# Patient Record
Sex: Female | Born: 1955
Health system: Southern US, Community
[De-identification: ages and names within clinical notes are randomized; demographics above are authoritative.]

## PROBLEM LIST (undated history)

## (undated) DIAGNOSIS — I671 Cerebral aneurysm, nonruptured: Secondary | ICD-10-CM

## (undated) DIAGNOSIS — K635 Polyp of colon: Secondary | ICD-10-CM

## (undated) DIAGNOSIS — R251 Tremor, unspecified: Secondary | ICD-10-CM

## (undated) DIAGNOSIS — M5417 Radiculopathy, lumbosacral region: Secondary | ICD-10-CM

## (undated) DIAGNOSIS — E079 Disorder of thyroid, unspecified: Secondary | ICD-10-CM

## (undated) DIAGNOSIS — H269 Unspecified cataract: Secondary | ICD-10-CM

## (undated) DIAGNOSIS — M810 Age-related osteoporosis without current pathological fracture: Secondary | ICD-10-CM

## (undated) DIAGNOSIS — G4700 Insomnia, unspecified: Secondary | ICD-10-CM

## (undated) DIAGNOSIS — I10 Essential (primary) hypertension: Secondary | ICD-10-CM

## (undated) DIAGNOSIS — C4490 Unspecified malignant neoplasm of skin, unspecified: Secondary | ICD-10-CM

## (undated) DIAGNOSIS — E7800 Pure hypercholesterolemia, unspecified: Secondary | ICD-10-CM

## (undated) DIAGNOSIS — R519 Headache, unspecified: Secondary | ICD-10-CM

## (undated) DIAGNOSIS — Z87442 Personal history of urinary calculi: Secondary | ICD-10-CM

## (undated) DIAGNOSIS — G43909 Migraine, unspecified, not intractable, without status migrainosus: Secondary | ICD-10-CM

## (undated) DIAGNOSIS — N200 Calculus of kidney: Secondary | ICD-10-CM

## (undated) DIAGNOSIS — E119 Type 2 diabetes mellitus without complications: Secondary | ICD-10-CM

## (undated) HISTORY — DX: Unspecified cataract: H26.9

## (undated) HISTORY — DX: Calculus of kidney: N20.0

## (undated) HISTORY — DX: Disorder of thyroid, unspecified: E07.9

## (undated) HISTORY — DX: Pure hypercholesterolemia, unspecified: E78.00

## (undated) HISTORY — DX: Tremor, unspecified: R25.1

## (undated) HISTORY — DX: Radiculopathy, lumbosacral region: M54.17

## (undated) HISTORY — DX: Insomnia, unspecified: G47.00

## (undated) HISTORY — DX: Unspecified malignant neoplasm of skin, unspecified: C44.90

## (undated) HISTORY — DX: Age-related osteoporosis without current pathological fracture: M81.0

## (undated) HISTORY — PX: INNER EAR SURGERY: SHX679

## (undated) HISTORY — DX: Cerebral aneurysm, nonruptured: I67.1

## (undated) HISTORY — DX: Type 2 diabetes mellitus without complications: E11.9

## (undated) HISTORY — DX: Polyp of colon: K63.5

## (undated) HISTORY — DX: Essential (primary) hypertension: I10

## (undated) HISTORY — PX: VAGINAL HYSTERECTOMY: SHX2639

## (undated) HISTORY — DX: Headache, unspecified: R51.9

## (undated) HISTORY — PX: BREAST BIOPSY: SHX20

## (undated) HISTORY — PX: APPENDECTOMY: SHX54

## (undated) HISTORY — DX: Migraine, unspecified, not intractable, without status migrainosus: G43.909

## (undated) HISTORY — PX: TONSILLECTOMY AND ADENOIDECTOMY: SUR1326

---

## 1997-10-23 ENCOUNTER — Inpatient Hospital Stay (HOSPITAL_COMMUNITY): Admission: EM | Admit: 1997-10-23 | Discharge: 1997-10-24 | Payer: Self-pay | Admitting: Emergency Medicine

## 2011-05-26 DIAGNOSIS — E119 Type 2 diabetes mellitus without complications: Secondary | ICD-10-CM

## 2011-05-26 HISTORY — DX: Type 2 diabetes mellitus without complications: E11.9

## 2011-05-26 HISTORY — PX: CHOLECYSTECTOMY: SHX55

## 2011-05-26 LAB — HM MAMMOGRAPHY

## 2011-09-22 DIAGNOSIS — N200 Calculus of kidney: Secondary | ICD-10-CM | POA: Insufficient documentation

## 2011-09-22 DIAGNOSIS — Z85828 Personal history of other malignant neoplasm of skin: Secondary | ICD-10-CM | POA: Insufficient documentation

## 2012-05-25 LAB — HM COLONOSCOPY

## 2012-05-25 LAB — HM MAMMOGRAPHY

## 2012-07-06 DIAGNOSIS — K219 Gastro-esophageal reflux disease without esophagitis: Secondary | ICD-10-CM | POA: Insufficient documentation

## 2012-08-19 DIAGNOSIS — K229 Disease of esophagus, unspecified: Secondary | ICD-10-CM | POA: Insufficient documentation

## 2013-02-01 DIAGNOSIS — Z8659 Personal history of other mental and behavioral disorders: Secondary | ICD-10-CM | POA: Insufficient documentation

## 2013-03-17 DIAGNOSIS — R519 Headache, unspecified: Secondary | ICD-10-CM | POA: Insufficient documentation

## 2013-03-17 DIAGNOSIS — Z8679 Personal history of other diseases of the circulatory system: Secondary | ICD-10-CM | POA: Insufficient documentation

## 2013-03-19 DIAGNOSIS — I671 Cerebral aneurysm, nonruptured: Secondary | ICD-10-CM

## 2013-03-19 HISTORY — DX: Cerebral aneurysm, nonruptured: I67.1

## 2013-03-19 HISTORY — PX: BRAIN SURGERY: SHX531

## 2013-06-23 ENCOUNTER — Encounter: Payer: Self-pay | Admitting: Family Medicine

## 2013-06-23 ENCOUNTER — Ambulatory Visit (HOSPITAL_BASED_OUTPATIENT_CLINIC_OR_DEPARTMENT_OTHER)
Admission: RE | Admit: 2013-06-23 | Discharge: 2013-06-23 | Disposition: A | Discharge status: Home / Self Care | Payer: Managed Care, Other (non HMO) | Source: Ambulatory Visit | Attending: Family Medicine | Admitting: Family Medicine

## 2013-06-23 ENCOUNTER — Ambulatory Visit (INDEPENDENT_AMBULATORY_CARE_PROVIDER_SITE_OTHER): Payer: Managed Care, Other (non HMO) | Admitting: Family Medicine

## 2013-06-23 VITALS — BP 130/70 | HR 94 | Temp 98.2°F | Ht 67.0 in | Wt 178.0 lb

## 2013-06-23 DIAGNOSIS — J40 Bronchitis, not specified as acute or chronic: Secondary | ICD-10-CM | POA: Insufficient documentation

## 2013-06-23 DIAGNOSIS — R0602 Shortness of breath: Secondary | ICD-10-CM

## 2013-06-23 DIAGNOSIS — J019 Acute sinusitis, unspecified: Secondary | ICD-10-CM

## 2013-06-23 DIAGNOSIS — J209 Acute bronchitis, unspecified: Secondary | ICD-10-CM

## 2013-06-23 DIAGNOSIS — I10 Essential (primary) hypertension: Secondary | ICD-10-CM | POA: Insufficient documentation

## 2013-06-23 DIAGNOSIS — E119 Type 2 diabetes mellitus without complications: Secondary | ICD-10-CM | POA: Insufficient documentation

## 2013-06-23 DIAGNOSIS — R059 Cough, unspecified: Secondary | ICD-10-CM | POA: Insufficient documentation

## 2013-06-23 DIAGNOSIS — R05 Cough: Secondary | ICD-10-CM | POA: Insufficient documentation

## 2013-06-23 MED ORDER — AZITHROMYCIN 250 MG PO TABS: ORAL_TABLET | ORAL | Status: DC

## 2013-06-23 NOTE — Patient Instructions (Signed)

## 2013-06-23 NOTE — Progress Notes (Signed)
  Subjective:     Laura Gentry is a 58 y.o. female who presents for evaluation of sinus pain. Symptoms include: congestion, cough, facial pain, nasal congestion, post nasal drip and sinus pressure. Onset of symptoms was 2 weeks ago. Symptoms have been gradually worsening since that time. Past history is significant for no history of pneumonia or bronchitis. Patient is a former smoker, quit 2 years ago.    The following portions of the patient's history were reviewed and updated as appropriate: allergies, current medications, past family history, past medical history, past social history, past surgical history and problem list.  Review of Systems Pertinent items are noted in HPI.   Objective:    BP 130/70  Pulse 94  Temp(Src) 98.2 F (36.8 C) (Oral)  Ht 5\' 7"  (1.702 m)  Wt 178 lb (80.74 kg)  BMI 27.87 kg/m2  SpO2 98% General appearance: alert, cooperative, appears stated age and no distress Ears: normal TM's and external ear canals both ears Nose: green discharge, moderate congestion, turbinates red, swollen, sinus tenderness bilateral Throat: lips, mucosa, and tongue normal; teeth and gums normal Neck: no adenopathy, supple, symmetrical, trachea midline and thyroid not enlarged, symmetric, no tenderness/mass/nodules Lungs: clear to auscultation bilaterally Heart: S1, S2 normal    Assessment:    Acute bacterial sinusitis SOB / bronchitis.      Plan:    Nasal steroids per medication orders. Antihistamines per medication orders. Zithromax per medication orders. /fu prn  cxr

## 2013-06-23 NOTE — Progress Notes (Signed)
Pre visit review using our clinic review tool, if applicable. No additional management support is needed unless otherwise documented below in the visit note. 

## 2013-06-28 ENCOUNTER — Telehealth: Payer: Self-pay

## 2013-06-28 NOTE — Telephone Encounter (Signed)
Patient called to schedule an appt for SOB. Patient states that she has been having SOB on excertion since her visit at the end of January. Would like to be rechecked. Scheduler ask for advise. Advised that she can wait until next available. Will be seen at  815 Thurs 06/29/2012.

## 2013-06-29 ENCOUNTER — Encounter: Payer: Self-pay | Admitting: Nurse Practitioner

## 2013-06-29 ENCOUNTER — Ambulatory Visit (INDEPENDENT_AMBULATORY_CARE_PROVIDER_SITE_OTHER): Payer: Managed Care, Other (non HMO) | Admitting: Nurse Practitioner

## 2013-06-29 VITALS — BP 169/83 | HR 67 | Temp 97.7°F | Ht 67.0 in | Wt 176.4 lb

## 2013-06-29 DIAGNOSIS — R0602 Shortness of breath: Secondary | ICD-10-CM

## 2013-06-29 DIAGNOSIS — J189 Pneumonia, unspecified organism: Secondary | ICD-10-CM

## 2013-06-29 DIAGNOSIS — J984 Other disorders of lung: Secondary | ICD-10-CM

## 2013-06-29 MED ORDER — PREDNISONE 10 MG PO TABS: ORAL_TABLET | ORAL | Status: DC

## 2013-06-29 MED ORDER — ALBUTEROL SULFATE HFA 108 (90 BASE) MCG/ACT IN AERS: 2.0000 | INHALATION_SPRAY | Freq: Four times a day (QID) | RESPIRATORY_TRACT | Status: DC | PRN

## 2013-06-29 NOTE — Patient Instructions (Signed)
Please follow up in 3 weeks or sooner if symptoms do not improve. Nice to meet you!  Pneumonitis Pneumonitis is inflammation of the lungs.  CAUSES  Many things can cause pneumonitis. These can include:   A bacterial or viral infection. Pneumonitis due to an infection is usually called pneumonia.  Work-related exposures, including farm and industrial work. Some substances that can cause pneumonitis include asbestos, silica, inhaled acids, or inhaled chlorine gas.   Repeated exposure to bird feathers, bird feces, or other allergens.   Medicine such as chemotherapy drugs, certain antibiotics, and some heart medicines.   Radiation therapy.   Exposure to mold. A hot tub, sauna, or home humidifier can have mold growing in it, even if it looks clean. The mold can be breathed in through water vapor.  Breathing (aspirating) stomach contents, food, or liquids into the lungs.  SIGNS AND SYMPTOMS   Cough.   Shortness of breath or difficulty breathing.   Fever.   Decreased energy.   Decreased appetite.  DIAGNOSIS  To diagnose pneumonitis, your health care provider will do a complete history and physical exam. Various tests may be ordered, such as:   Pulmonary function test.   Chest X-ray.   CT scan of the lungs.   Bronchoscopy.   Lung biopsy.  TREATMENT  Treatment will depend on the cause of the pneumonitis. If the cause is exposure to a substance, avoiding further exposure to that substance will help reduce your symptoms. Possible medical treatments for pneumonitis include:   Corticosteroid medicine to help decrease inflammation in the lungs.   Antibiotic medicine to help fight a bacterial lung infection.   Oxygen therapy if you are having difficulty breathing.  HOME CARE INSTRUCTIONS   Avoid exposure to any substance identified as the cause of your pneumonitis.   If you must continue to work with substances that can cause pneumonitis, wear a mask to  protect your lungs.   Only take over-the-counter or prescription medicine as directed by your health care provider.   Do not smoke.   If you use inhalers, keep them with you at all times.   Follow up with your health care provider as directed.  SEEK IMMEDIATE MEDICAL CARE IF:   You develop new or increased shortness of breath.   You develop a blue color (cyanosis) under your fingernails.   You have a fever.  MAKE SURE YOU:   Understand these instructions.  Will watch your condition.  Will get help right away if you are not doing well or get worse. Document Released: 10/29/2009 Document Revised: 01/11/2013 Document Reviewed: 10/31/2012 [facility] Patient Information 2014 [place], [place].

## 2013-06-29 NOTE — Progress Notes (Signed)
Pre-visit discussion using our clinic review tool. No additional management support is needed unless otherwise documented below in the visit note.  

## 2013-06-30 DIAGNOSIS — R0602 Shortness of breath: Secondary | ICD-10-CM | POA: Insufficient documentation

## 2013-06-30 NOTE — Progress Notes (Signed)
Subjective:    Patient ID: Laura Gentry, female    DOB: 10/30/1955, 58 y.o.   MRN: [id number]  Shortness of Breath This is a chronic problem. The current episode started 1 to 4 weeks ago (3wks). The problem occurs daily. The problem has been gradually worsening (pt was seen in ofc 1 wk ago for cough, SOB, fatigue. Tx w/nasal steroids & azithromycin.Marland Kitchen Cough better, but has worsening SOB, persistent fatigue.). Associated symptoms include headaches (chronic HA since aneurysm clipping). Pertinent negatives include no abdominal pain, chest pain, ear pain, fever, hemoptysis, leg pain, leg swelling, neck pain, sore throat, sputum production, vomiting or wheezing. The symptoms are aggravated by URIs (activity-walking: states had to rest in grocery store due to SOB, unusual for her. States symptoms started w/"cold" 3 wks ago. ). The patient has no known risk factors for DVT/PE. She has tried nothing for the symptoms. The treatment provided mild (cough & upper resp symptoms improved, SOB getting worse.) relief. Her past medical history is significant for pneumonia (reports pneumonia 3-4 times, last episode 5 ya.) and a recent surgery (brain aneurysm w/clipping 3 mos ago).      Review of Systems  Constitutional: Positive for fatigue. Negative for fever, chills, activity change and appetite change.  HENT: Negative for congestion, ear pain, postnasal drip and sore throat.   Respiratory: Positive for shortness of breath. Negative for cough, hemoptysis, sputum production, chest tightness and wheezing.   Cardiovascular: Negative for chest pain, palpitations and leg swelling.  Gastrointestinal: Negative for vomiting, abdominal pain, diarrhea and blood in stool.  Musculoskeletal: Negative for back pain and neck pain.  Neurological: Positive for tremors (developed hand tremor since aneurysm clipping. Neuro started propranolol, pt started med yesterday-has helped tremor.) and headaches (chronic HA since aneurysm  clipping).       ST Memory loss since aneurysm clipping       Objective:   Physical Exam  Vitals reviewed. Constitutional: She is oriented to person, place, and time. She appears well-developed and well-nourished. No distress.  HENT:  Head: Normocephalic and atraumatic.  Right Ear: External ear normal.  Left Ear: External ear normal.  Mouth/Throat: Oropharynx is clear and moist. No oropharyngeal exudate.  Wearing wig.  Eyes: Conjunctivae are normal. Right eye exhibits no discharge. Left eye exhibits no discharge.  Neck: Normal range of motion. Neck supple. No thyromegaly present.  Cardiovascular: Normal rate, regular rhythm and normal heart sounds.   No murmur heard. Pulmonary/Chest: Effort normal. No respiratory distress. She has no wheezes. She has no rales.  Diffusely coarse BS, bilat. Speaking in complete sentences. Does not appear SOB while sitting & talking.  Lymphadenopathy:    She has no cervical adenopathy.  Neurological: She is alert and oriented to person, place, and time.  Skin: Skin is warm and dry.  No pallor or cyanosis  Psychiatric: She has a normal mood and affect. Her behavior is normal. Thought content normal.          Assessment & Plan:  1. SOBOE (shortness of breath on exertion) Recent Hx viral URI w/bronchitis. DD: pulm thrombus, anemia, effects of beta blocker, new onset CHF  2. Pneumonitis Likely post-viral reaction.  - predniSONE (DELTASONE) 10 MG tablet; Take 4T PO QAM X 3d, then 3T PO QAM X 3d, then 2T PO QAM X 3d, then 1T PO QAM X 3d, them d/c.  Dispense: 30 tablet; Refill: 0 - albuterol (PROVENTIL HFA;VENTOLIN HFA) 108 (90 BASE) MCG/ACT inhaler; Inhale 2 puffs into the lungs every  6 (six) hours as needed for shortness of breath.  Dispense: 1 Inhaler; Refill: 0  See pt instructions.

## 2013-07-10 ENCOUNTER — Telehealth: Payer: Self-pay | Admitting: *Deleted

## 2013-07-10 ENCOUNTER — Ambulatory Visit (INDEPENDENT_AMBULATORY_CARE_PROVIDER_SITE_OTHER): Payer: Managed Care, Other (non HMO) | Admitting: Physician Assistant

## 2013-07-10 ENCOUNTER — Encounter: Payer: Self-pay | Admitting: Physician Assistant

## 2013-07-10 VITALS — BP 128/79 | HR 61 | Temp 98.0°F | Resp 16 | Ht 67.0 in | Wt 177.1 lb

## 2013-07-10 DIAGNOSIS — R059 Cough, unspecified: Secondary | ICD-10-CM

## 2013-07-10 DIAGNOSIS — R0602 Shortness of breath: Secondary | ICD-10-CM

## 2013-07-10 DIAGNOSIS — R05 Cough: Secondary | ICD-10-CM

## 2013-07-10 DIAGNOSIS — J069 Acute upper respiratory infection, unspecified: Secondary | ICD-10-CM

## 2013-07-10 LAB — BASIC METABOLIC PANEL
BUN: 13 mg/dL (ref 6–23)
CO2: 27 mEq/L (ref 19–32)
Calcium: 8.7 mg/dL (ref 8.4–10.5)
Chloride: 101 mEq/L (ref 96–112)
Creatinine, Ser: 0.9 mg/dL (ref 0.4–1.2)
GFR: 71.03 mL/min (ref >60.00)
Glucose, Bld: 141 mg/dL — ABNORMAL HIGH (ref 70–99)
Potassium: 3.9 mEq/L (ref 3.5–5.1)
Sodium: 137 mEq/L (ref 135–145)

## 2013-07-10 LAB — CBC WITH DIFFERENTIAL/PLATELET
Basophils Absolute: 0 10*3/uL (ref 0.0–0.1)
Basophils Relative: 0.4 % (ref 0.0–3.0)
Eosinophils Absolute: 0.2 10*3/uL (ref 0.0–0.7)
Eosinophils Relative: 1.7 % (ref 0.0–5.0)
HCT: 41.7 % (ref 36.0–46.0)
Hemoglobin: 13.3 g/dL (ref 12.0–15.0)
Lymphocytes Relative: 29.2 % (ref 12.0–46.0)
Lymphs Abs: 3.2 10*3/uL (ref 0.7–4.0)
MCHC: 31.9 g/dL (ref 30.0–36.0)
MCV: 83.1 fl (ref 78.0–100.0)
Monocytes Absolute: 0.7 10*3/uL (ref 0.1–1.0)
Monocytes Relative: 6.6 % (ref 3.0–12.0)
Neutro Abs: 6.8 10*3/uL (ref 1.4–7.7)
Neutrophils Relative %: 62.1 % (ref 43.0–77.0)
Platelets: 381 10*3/uL (ref 150.0–400.0)
RBC: 5.02 Mil/uL (ref 3.87–5.11)
RDW: 13.9 % (ref 11.5–14.6)
WBC: 10.9 10*3/uL — ABNORMAL HIGH (ref 4.5–10.5)

## 2013-07-10 LAB — BRAIN NATRIURETIC PEPTIDE: Pro B Natriuretic peptide (BNP): 51 pg/mL (ref 0.0–100.0)

## 2013-07-10 LAB — D-DIMER, QUANTITATIVE: D-Dimer, Quant: 0.27 ug/mL-FEU (ref 0.00–0.48)

## 2013-07-10 MED ORDER — HYDROCOD POLST-CHLORPHEN POLST 10-8 MG/5ML PO LQCR: 5.0000 mL | Freq: Two times a day (BID) | ORAL | Status: DC | PRN

## 2013-07-10 NOTE — Patient Instructions (Signed)
Please obtain labs.  I will call you with your results.  Increase fluid intake.  Rest.  Saline nasal spray.  Use tussionex as directed for cough.  Finish prednisone taper.  Use albuterol as directed. Plain mucinex. If you develop worsening shortness of breath, chest pain, lightheadedness or dizziness, please proceed immediately to the ER.

## 2013-07-10 NOTE — Telephone Encounter (Signed)
[facility] lab called with D Dimer results of 0.27. Results forwarded to Elyn Aquas, [facility]

## 2013-07-10 NOTE — Progress Notes (Signed)
Pre visit review using our clinic review tool, if applicable. No additional management support is needed unless otherwise documented below in the visit note/SLS  

## 2013-07-10 NOTE — Progress Notes (Signed)
Patient presents to clinic today c/o 2 days of sinus pressure, postnasal drip and nonproductive cough. Patient denies fever, chills or aches. Patient does endorse some shortness of breath with exertion that has been present for over a month. Patient was recently seen on 06/29/2013 and diagnosed with shortness of breath on exertion and pneumonitis.  Patient was given prescription for prednisone taper and albuterol inhaler.  Patient endorses taking steroid taper as prescribed. Has a few days left. Has not used albuterol inhaler. Patient denies history of stroke, heart attack or CHF. Patient has history of asthma. Patient denies history of blood clot. Denies recent surgery, recent travel or prolonged immobilization. Patient denies leg swelling. Patient denies chest pain, palpitations, lightheadedness or dizziness. Denies history of syncope. Recent CXR was negative for acute cardiopulmonary process.  Past Medical History  Diagnosis Date  . Diabetes   . High blood pressure   . Kidney stones   . Migraines   . Colon polyps   . Thyroid disease   . Brain aneurysm 03/19/13    Current Outpatient Prescriptions on File Prior to Visit  Medication Sig Dispense Refill  . albuterol (PROVENTIL HFA;VENTOLIN HFA) 108 (90 BASE) MCG/ACT inhaler Inhale 2 puffs into the lungs every 6 (six) hours as needed for shortness of breath.  1 Inhaler  0  . amitriptyline (ELAVIL) 10 MG tablet Take 10 mg by mouth at bedtime.      . cyclobenzaprine (FLEXERIL) 10 MG tablet Take 10 mg by mouth 3 (three) times daily as needed for muscle spasms.      Marland Kitchen levETIRAcetam (KEPPRA) 500 MG tablet Take 500 mg by mouth 2 (two) times daily.      Marland Kitchen levothyroxine (SYNTHROID, LEVOTHROID) 50 MCG tablet Take 50 mcg by mouth daily before breakfast.      . omeprazole (PRILOSEC) 40 MG capsule Take 40 mg by mouth daily.      . pravastatin (PRAVACHOL) 10 MG tablet Take 10 mg by mouth daily.      . predniSONE (DELTASONE) 10 MG tablet Take 4T PO QAM X 3d,  then 3T PO QAM X 3d, then 2T PO QAM X 3d, then 1T PO QAM X 3d, them d/c.  30 tablet  0  . propranolol (INDERAL) 80 MG tablet Take 80 mg by mouth at bedtime.      . sertraline (ZOLOFT) 100 MG tablet Take 100 mg by mouth daily.      . traMADol (ULTRAM) 50 MG tablet Take 50 mg by mouth every 12 (twelve) hours as needed.       No current facility-administered medications on file prior to visit.    Allergies  Allergen Reactions  . Penicillins Anaphylaxis  . Sulfa Antibiotics Hives, Itching and Swelling    No family history on file.  History   Social History  . Marital Status: Single    Spouse Name: N/A    Number of Children: N/A  . Years of Education: N/A   Social History Main Topics  . Smoking status: Former Smoker    Quit date: 05/26/2011  . Smokeless tobacco: Never Used  . Alcohol Use: No  . Drug Use: No  . Sexual Activity: None   Other Topics Concern  . None   Social History Narrative  . None    Review of Systems - See HPI.  All other ROS are negative.  BP 128/79  Pulse 61  Temp(Src) 98 F (36.7 C) (Oral)  Resp 16  Ht 5\' 7"  (1.702 m)  Wt  177 lb 2 oz (80.343 kg)  BMI 27.74 kg/m2  SpO2 97%  Physical Exam  Vitals reviewed. Constitutional: She is oriented to person, place, and time and well-developed, well-nourished, and in no distress.  HENT:  Head: Normocephalic and atraumatic.  Right Ear: External ear normal.  Left Ear: External ear normal.  Nose: Nose normal.  Mouth/Throat: Oropharynx is clear and moist. No oropharyngeal exudate.  Tympanic membrane within normal limits bilaterally. No tenderness to percussion of sinuses noted on exam.  Eyes: Conjunctivae are normal. Pupils are equal, round, and reactive to light.  Neck: Neck supple.  Cardiovascular: Normal rate, regular rhythm, normal heart sounds and intact distal pulses.   Pulses:      Dorsalis pedis pulses are 2+ on the right side, and 2+ on the left side.       Posterior tibial pulses are 2+ on the  right side, and 2+ on the left side.  No LE edema or tenderness noted on examination.  Pulmonary/Chest: Effort normal and breath sounds normal. No respiratory distress. She has no wheezes. She has no rales. She exhibits no tenderness.  Lymphadenopathy:    She has no cervical adenopathy.  Neurological: She is alert and oriented to person, place, and time.  Skin: Skin is warm and dry. No rash noted.  Psychiatric: Affect normal.    Assessment/Plan: SOB (shortness of breath) on exertion Seems pulmonary in nature. May still be a post-viral reaction to recent URI.  However, feel it merits further workup. Will obtain CBC, BMP, D-Dimer.  Patient encouraged to finish steroid taper as prescribed.  Use albuterol inhaler PRN as directed.  Referral to Pulmonology placed for assessment ant PFT's. Patient educated on alarm symptoms and when to proceed to ER.  Patient voices understanding.  Viral URI Symptoms x 2 days.  Increase fluid intake.  Rest.  Saline nasal spray.  Albuterol inhaler and Prednisone taper.  Humidifer in bedroom.  Plain Mucinex.  Probiotic.

## 2013-07-11 DIAGNOSIS — J069 Acute upper respiratory infection, unspecified: Secondary | ICD-10-CM | POA: Insufficient documentation

## 2013-07-11 DIAGNOSIS — R0602 Shortness of breath: Secondary | ICD-10-CM | POA: Insufficient documentation

## 2013-07-11 NOTE — Assessment & Plan Note (Signed)
Symptoms x 2 days.  Increase fluid intake.  Rest.  Saline nasal spray.  Albuterol inhaler and Prednisone taper.  Humidifer in bedroom.  Plain Mucinex.  Probiotic.

## 2013-07-11 NOTE — Assessment & Plan Note (Addendum)
Seems pulmonary in nature. May still be a post-viral reaction to recent URI.  However, feel it merits further workup. Will obtain CBC, BMP, D-Dimer.  Patient encouraged to finish steroid taper as prescribed.  Use albuterol inhaler PRN as directed.  Referral to Pulmonology placed for assessment ant PFT's. Patient educated on alarm symptoms and when to proceed to ER.  Patient voices understanding.

## 2013-07-17 ENCOUNTER — Encounter: Payer: Self-pay | Admitting: Internal Medicine

## 2013-07-17 ENCOUNTER — Ambulatory Visit (INDEPENDENT_AMBULATORY_CARE_PROVIDER_SITE_OTHER): Payer: Self-pay | Admitting: Internal Medicine

## 2013-07-17 VITALS — BP 118/76 | HR 74 | Temp 98.1°F | Ht 67.0 in | Wt 178.8 lb

## 2013-07-17 DIAGNOSIS — R0602 Shortness of breath: Secondary | ICD-10-CM

## 2013-07-17 DIAGNOSIS — I10 Essential (primary) hypertension: Secondary | ICD-10-CM

## 2013-07-17 MED ORDER — NEBIVOLOL HCL 10 MG PO TABS: 10.0000 mg | ORAL_TABLET | Freq: Every day | ORAL | Status: DC

## 2013-07-17 NOTE — Patient Instructions (Signed)
Stop inderal/propranolol Start bystolic 10 mg daily in its place  Take tussionex up to 2 tsp every 6 hours over the next 3-5 days to get out in front of the cough then stop it  Prilosec 40 mg Take 30-60 min before first meal of the day and take pepcid 20 mg one at bedtime until return here  GERD (REFLUX)  is an extremely common cause of respiratory symptoms, many times with no significant heartburn at all.    It can be treated with medication, but also with lifestyle changes including avoidance of late meals, excessive alcohol, smoking cessation, and avoid fatty foods, chocolate, peppermint, colas, red wine, and acidic juices such as orange juice.  NO MINT OR MENTHOL PRODUCTS SO NO COUGH DROPS  USE SUGARLESS CANDY INSTEAD (jolley ranchers or Stover's)  NO OIL BASED VITAMINS - use powdered substitutes.   Please schedule a follow up office visit in 4 weeks, sooner if needed

## 2013-07-17 NOTE — Progress Notes (Signed)
   Subjective:    Patient ID: Laura Gentry, female    DOB: 29-May-1955   MRN: [id number]  HPI  72 yowf quit smoking 03/2012 with no resp sequelae or limitations including racquetball then [facility] in Oct 2014 referred 07/17/2013 to pulmonary clinic by Dr Etter Sjogren for eval unexplained sob.   07/17/2013 1st [facility] office visit/ Nishant Schrecengost cc new onset sob p moved in with sister [name] in Oct p [facility] but acute onset sob assoc with  ? [name] developed while at [facility] in Dec 2014.  Cough worse when lie down on inderal 80 mg at hs-  mostly dry assoc with sore throat.  Doe x across the room, no better with saba, some better with tussionex at hs    No obvious other patterns in day to day or daytime variabilty or assoc  cp or chest tightness, subjective wheeze overt sinus or hb symptoms. No unusual exp hx or h/o childhood pna/ asthma or knowledge of premature birth.  Sleeping ok without nocturnal  or early am exacerbation  of respiratory  c/o's or need for noct saba. Also denies any obvious fluctuation of symptoms with weather or environmental changes or other aggravating or alleviating factors except as outlined above   Current Medications, Allergies, Complete Past Medical History, Past Surgical History, Family History, and Social History were reviewed in [employer] record.           Review of Systems  Constitutional: Negative for fever, chills and unexpected weight change.  HENT: Positive for sore throat. Negative for congestion, dental problem, ear pain, nosebleeds, postnasal drip, rhinorrhea, sinus pressure, sneezing, trouble swallowing and voice change.   Eyes: Negative for visual disturbance.  Respiratory: Positive for cough and shortness of breath. Negative for choking.   Cardiovascular: Negative for chest pain and leg swelling.  Gastrointestinal: Negative for vomiting, abdominal pain and diarrhea.  Genitourinary: Negative for difficulty urinating.  Musculoskeletal: Negative  for arthralgias.  Skin: Negative for rash.  Neurological: Negative for tremors, syncope and headaches.  Hematological: Does not bruise/bleed easily.       Objective:   Physical Exam   Wt Readings from Last 3 Encounters:  07/17/13 178 lb 12.8 oz (81.103 kg)  07/10/13 177 lb 2 oz (80.343 kg)  06/29/13 176 lb 6.4 oz (80.015 kg)    Harsh barking quality cough   HEENT: nl dentition, turbinates, and orophanx. Nl external ear canals without cough reflex   NECK :  without JVD/Nodes/TM/ nl carotid upstrokes bilaterally   LUNGS: no acc muscle use, clear to A and P bilaterally without cough on insp or exp maneuvers   CV:  RRR  no s3 or murmur or increase in P2, no edema   ABD:  soft and nontender with nl excursion in the supine position. No bruits or organomegaly, bowel sounds nl  MS:  warm without deformities, calf tenderness, cyanosis or clubbing  SKIN: warm and dry without lesions    NEURO:  alert, approp, no deficits   cxr 06/26/13 Minimal bronchitic changes without infiltrate.      Assessment & Plan:

## 2013-07-18 DIAGNOSIS — I10 Essential (primary) hypertension: Secondary | ICD-10-CM | POA: Insufficient documentation

## 2013-07-18 NOTE — Assessment & Plan Note (Signed)
Symptoms are markedly disproportionate to objective findings and not clear this is a lung problem but pt does appear to have difficult airway management issues. DDX of  difficult airways managment all start with A and  include Adherence, Ace Inhibitors, Acid Reflux, Active Sinus Disease, Alpha 1 Antitripsin deficiency, Anxiety masquerading as Airways dz,  ABPA,  allergy(esp in young), Aspiration (esp in elderly), Adverse effects of DPI,  Active smokers, plus two Bs  = Bronchiectasis and Beta blocker use..and one C= CHF  Beta blockers (inderal is the most beta non-specific of all the BB) need to be at the top of the list of usual suspects in her case and will need to substitute bystolic (see HBP)  ? Acid (or non-acid) GERD > always difficult to exclude as up to 75% of pts in some series report no assoc GI/ Heartburn symptoms> rec max (24h)  acid suppression and diet restrictions/ reviewed and instructions given in writing.

## 2013-07-18 NOTE — Assessment & Plan Note (Signed)
Strongly prefer in this setting: Bystolic, the most beta -1  selective Beta blocker available in sample form, with bisoprolol the most selective generic choice  on the market.  

## 2013-07-20 ENCOUNTER — Ambulatory Visit: Payer: Managed Care, Other (non HMO) | Admitting: Family Medicine

## 2013-07-26 ENCOUNTER — Other Ambulatory Visit: Payer: Self-pay | Admitting: Internal Medicine

## 2013-07-26 MED ORDER — PREDNISONE 10 MG PO TABS: ORAL_TABLET | ORAL | Status: DC

## 2013-08-15 ENCOUNTER — Ambulatory Visit: Payer: Self-pay | Admitting: Internal Medicine

## 2013-08-24 ENCOUNTER — Ambulatory Visit: Payer: Self-pay | Admitting: Family Medicine

## 2014-01-02 ENCOUNTER — Encounter: Payer: Self-pay | Admitting: Family Medicine

## 2014-04-30 ENCOUNTER — Ambulatory Visit (INDEPENDENT_AMBULATORY_CARE_PROVIDER_SITE_OTHER): Payer: Self-pay | Admitting: Medical

## 2014-04-30 ENCOUNTER — Encounter: Payer: Self-pay | Admitting: Medical

## 2014-04-30 VITALS — BP 118/74 | HR 70 | Temp 97.6°F

## 2014-04-30 DIAGNOSIS — R112 Nausea with vomiting, unspecified: Secondary | ICD-10-CM

## 2014-04-30 DIAGNOSIS — J0100 Acute maxillary sinusitis, unspecified: Secondary | ICD-10-CM | POA: Insufficient documentation

## 2014-04-30 DIAGNOSIS — R110 Nausea: Secondary | ICD-10-CM | POA: Insufficient documentation

## 2014-04-30 DIAGNOSIS — J0101 Acute recurrent maxillary sinusitis: Secondary | ICD-10-CM

## 2014-04-30 MED ORDER — AZITHROMYCIN 250 MG PO TABS: ORAL_TABLET | ORAL | Status: DC

## 2014-04-30 MED ORDER — BENZONATATE 100 MG PO CAPS: 100.0000 mg | ORAL_CAPSULE | Freq: Three times a day (TID) | ORAL | Status: DC | PRN

## 2014-04-30 MED ORDER — FLUTICASONE PROPIONATE 50 MCG/ACT NA SUSP: 2.0000 | Freq: Every day | NASAL | Status: DC

## 2014-04-30 MED ORDER — ONDANSETRON 8 MG PO TBDP: 8.0000 mg | ORAL_TABLET | Freq: Three times a day (TID) | ORAL | Status: DC | PRN

## 2014-04-30 NOTE — Assessment & Plan Note (Signed)
.  Your appear to have a sinus infection but also bilateral om. (on exam your throat does not look suspicious for strep but the antibiotic for sinus infection  will cover throat in event of strep) I am prescribing antibiotic for the infection. To help with the nasal congestion I prescribed nasal steroid. For your associated cough, I prescribed cough medicine. Symptoms appears to have started viral and rapidly worsened but doe not sound flu like.

## 2014-04-30 NOTE — Patient Instructions (Addendum)
.  Your appear to have a sinus infection but also bilateral om. (on exam your throat does not look suspicious for strep but the antibiotic for sinus infection  will cover throat in event of strep) I am prescribing antibiotic for the infection. To help with the nasal congestion I prescribed nasal steroid. For your associated cough, I prescribed cough medicine. Symptoms appears to have started viral and rapidly worsened but doe not sound flu like.  Also stressed rest your voice for laryngitis. Gave 3 days off work.   For you recent transient dizziness and occasional vomiting, I am prescribing zofran. Due to your history of aneurysm and repair, I want to watch you closely.Iif symptoms worsen then ED evaluation. Update [place] if mild symptoms linger despite the zofran. In such event may get ct head with out contrast out patient. Currently mild symptoms I think are associated with your current illness.  Rest, hydrate, tylenol for fever.  Follow up in 7 days or as needed.

## 2014-04-30 NOTE — Assessment & Plan Note (Signed)
For you recent transient dizziness and occasional vomiting, I am prescribing zofran. Due to your history of aneurysm and repair, I want to watch you closely.Iif symptoms worsen then ED evaluation. Update [place] if mild symptoms linger despite the zofran. In such event may get ct head with out contrast out patient. Currently mild symptoms I think are associated with your current illness.

## 2014-04-30 NOTE — Progress Notes (Signed)
   Subjective:    Patient ID: Laura Gentry, female    DOB: 1955/08/20, 58 y.o.   MRN: [id number]  HPI   Pt has sore throat for  3 days. Pain swallowing own saliva and has hoarse voice. Pt works with public. No close contact with no know sick persons. On Friday lost her voice in afternoon. She got dizziness and vomit one time on last Friday. She vomits about one time a day. No stomach pain or diarrhea. She states if she does not eat won't vomit. Today has not eaten and not vomiting.   Associated symptom.  Body aches-no. Fever- Subjective fever.  Chills-Yes. HA- none Neck symptoms-none Lymph node enlargement-none that she notes Rash-no  Painful swallowing- yes Recent strep contact-Not known. Sinus pressure- frontal and maxillary. Cough-mild intermittent.  Pt had brain anuerysm last year and surgery to repair that Oct 23rd 2014.  She states that since surgery mild ha daily. Pt states neurologist is aware of this. Pt states in May had some scans were negative.     Review of Systems See hpi.    Objective:   Physical Exam   General  Mental Status - Alert. General Appearance - Well groomed. Not in acute distress. Hoarse voice  Skin Rashes- No Rashes.  HEENT Head- Normal. Ear Auditory Canal - Left- Normal. Right - Normal.Tympanic Membrane- Left- red. Right- red Eye Sclera/Conjunctiva- Left- Normal. Right- Normal. Nose & Sinuses Nasal Mucosa- Left-  Boggy and Congested. Right-  Boggy and  Congested.Bilateral maxillary and frontal sinus pressure. Mouth & Throat Lips: Upper Lip- Normal: no dryness, cracking, pallor, cyanosis, or vesicular eruption. Lower Lip-Normal: no dryness, cracking, pallor, cyanosis or vesicular eruption. Buccal Mucosa- Bilateral- No Aphthous ulcers. Oropharynx- No Discharge or Erythema. Tonsils: Characteristics- Bilateral- No Erythema or Congestion. Size/Enlargement- Bilateral- No enlargement. Discharge- bilateral-None.  Neck Neck- Supple. No  Masses.   Chest and Lung Exam Auscultation: Breath Sounds:-Clear even and unlabored.  Cardiovascular Auscultation:Rythm- Regular, rate and rhythm. Murmurs & Other Heart Sounds:Ausculatation of the heart reveal- No Murmurs.  Lymphatic Head & Neck General Head & Neck Lymphatics: Bilateral: Description- No Localized lymphadenopathy.       Neurologic Cranial Nerve exam:- CN III-XII intact(No nystagmus), symmetric smile. Drift Test:- No drift. Romberg Exam:- Negative.  Heal to Toe Gait exam:-Normal. Finger to Nose:- Normal/Intact Strength:- 5/5 equal and symmetric strength both upper and lower extremities.        Assessment & Plan:

## 2014-04-30 NOTE — Progress Notes (Signed)
Pre visit review using our clinic review tool, if applicable. No additional management support is needed unless otherwise documented below in the visit note. 

## 2014-05-02 ENCOUNTER — Telehealth: Payer: Self-pay | Admitting: Medical

## 2014-05-02 ENCOUNTER — Telehealth: Payer: Self-pay | Admitting: Family Medicine

## 2014-05-02 DIAGNOSIS — R05 Cough: Secondary | ICD-10-CM

## 2014-05-02 DIAGNOSIS — R059 Cough, unspecified: Secondary | ICD-10-CM

## 2014-05-02 NOTE — Telephone Encounter (Signed)
Caller name:Fazzino Katharine Look Relation to FY:BOFB Call back number:[phone] Pharmacy:[facility]  Reason for call: pt was seen by Percell Miller on 12/7, pt states her coughing has gotten worse and she is not sleeping at all because of the coughing. Pt voice is worse as well, pt states she is still taking the antibiotics but she is getting worse instead of better. Would like to know what her options are.

## 2014-05-02 NOTE — Telephone Encounter (Signed)
Note being sent to LPN. Address my request.

## 2014-05-02 NOTE — Telephone Encounter (Signed)
Advise pt that she can come by and pick up prescription of tussionex Disp:115 ml Sig: 5 ml po q 12 hrs prn cough.(stop benzonatate) Would you print that out and I will sign. Also will put in cxr order. She can pick that up tussionex when she gets cxr. May or may not need appointment but if she wants to be seen I can see her. Continue azithromycin. If by Monday she is not better then come in. If worsens over weekend then UC.

## 2014-05-03 NOTE — Telephone Encounter (Signed)
Left a message for call back.  

## 2014-05-07 ENCOUNTER — Other Ambulatory Visit: Payer: Self-pay

## 2014-05-07 MED ORDER — HYDROCOD POLST-CHLORPHEN POLST 10-8 MG/5ML PO LQCR: 5.0000 mL | Freq: Two times a day (BID) | ORAL | Status: DC | PRN

## 2014-05-07 NOTE — Telephone Encounter (Signed)
Spoke with patient regarding coming in for  chest XR and picking up Rx to Tussionex.  Patient agreed.

## 2014-06-28 ENCOUNTER — Ambulatory Visit (INDEPENDENT_AMBULATORY_CARE_PROVIDER_SITE_OTHER): Payer: Managed Care, Other (non HMO) | Admitting: Family Medicine

## 2014-06-28 ENCOUNTER — Encounter: Payer: Self-pay | Admitting: Family Medicine

## 2014-06-28 VITALS — BP 147/83 | HR 75 | Temp 98.3°F | Ht 67.0 in | Wt 166.4 lb

## 2014-06-28 DIAGNOSIS — T148 Other injury of unspecified body region: Secondary | ICD-10-CM

## 2014-06-28 DIAGNOSIS — M6740 Ganglion, unspecified site: Secondary | ICD-10-CM

## 2014-06-28 DIAGNOSIS — T148XXA Other injury of unspecified body region, initial encounter: Secondary | ICD-10-CM

## 2014-06-28 DIAGNOSIS — M259 Joint disorder, unspecified: Secondary | ICD-10-CM

## 2014-06-28 DIAGNOSIS — I671 Cerebral aneurysm, nonruptured: Secondary | ICD-10-CM

## 2014-06-28 NOTE — Patient Instructions (Addendum)
Needs appt with Dr Etter Sjogren in 3 months for follow up   Cerebral Aneurysm An aneurysm is the bulging or ballooning out of part of the weakened wall of a vein or artery. An aneurysm in the vein or artery of the brain is called a brain aneurysm, or cerebral aneurysm.  Aneurysms are a risk to your health because they may leak or rupture. Once the aneurysm leaks or ruptures, bleeding occurs. If the bleeding occurs within the brain tissue, the condition is called an intracerebral hemorrhage. An intracerebral hemorrhage can result in a hemorrhagic stroke. If the bleeding occurs in the area between the brain and the thin tissues that cover the brain, the condition is called a subarachnoid hemorrhage. This increases the pressure on the brain and causes some areas of the brain to not get the necessary blood flow. The blood from the ruptured aneurysm collects and presses on the surrounding brain tissue. A subarachnoid hemorrhage can cause a stroke. A ruptured cerebral aneurysm is a medical emergency. This can cause permanent damage and loss of brain function. CAUSES A cerebral aneurysm is caused when a weakened part of the blood vessel expands. The blood vessel expands due to the constant pressure from the flow of blood through the weakened blood vessel. Usually the aneurysm expands slowly. As the weakened aneurysm expands, the walls of the aneurysm become weaker. Aneurysms may be associated with diseases that weaken and damage the walls of your blood vessels or blood vessels that develop abnormally. Some known causes for cerebral aneurysms are:  Head trauma.  Infection.  Use of "recreational drugs" such as cocaine or amphetamines. RISK FACTORS People at risk for a cerebral aneurysm or hemorrhagic stroke usually have one or more risk factors, which include:  Having high blood pressure (hypertension).  Abusing alcohol.  Having abnormal blood vessels present since birth.  Having certain bleeding disorders,  such as hemophilia, sickle cell disease, or liver disease.  Taking blood thinners (anticoagulants).  Smoking. SIGNS AND SYMPTOMS  The signs and symptoms of an unruptured cerebral aneurysm will partly depend on its size and rate of growth. A small, unchanging aneurysm generally does not produce symptoms. A larger aneurysm that is steadily growing can increase pressure on the brain or nerves. That increased pressure from the unruptured cerebral aneurysm can cause:  A headache.  Problems with your vision.  Numbness or weakness in an arm or leg.  Problems with memory.  Problems speaking.  Seizures. If an aneurysm leaks or bursts, it can cause a stroke and be life-threatening. Symptoms may include:  A sudden, severe headache with no known cause. The headache is often described as the worst headache ever experienced.  Nausea or vomiting, especially when combined with other symptoms such as a headache.  Sudden weakness or numbness of the face, arm, or leg, especially on one side of the body.  Sudden trouble walking or difficulty moving arms or legs.  Sudden confusion.  Sudden personality changes.  Trouble speaking (aphasia) or understanding.  Difficulty swallowing.  Sudden trouble seeing in one or both eyes.  Double vision.  Dizziness.  Loss of balance or coordination.  Intolerance to light.  Stiff neck. DIAGNOSIS  A CTA (computed tomographic angiography) may be performed to diagnose an aneurysm. A CTA uses dye and a CT scanner to take images of your blood vessels. An MRA (magnetic resonance angiography) may be used to diagnose an aneurysm. An MRA is performed in an MRI machine. While in the MRI machine, images of your  blood vessels are taken. A cerebral aneurysm may also be diagnosed with a cerebral angiogram. A cerebral angiogram requires a tube called a catheter to be inserted into a blood vessel and advanced to the blood vessels in your neck. Dye is then injected while  X-ray images are taken to show the blood vessels in your brain. TREATMENT  Unruptured Aneurysms Treatment is complex when an aneurysm is found and it is not causing problems. Treatment is very individualized, as each case is different. Many things must be considered, such as the size and exact location of your aneurysm, your age, your overall health, and your feelings and preferences. Small aneurysms in certain locations of the brain have a very low chance of bleeding or rupturing. These small aneurysms may not be treated. However, depending on the size and location of the aneurysm, treatments may be recommended and include:  Coiling. During this procedure, a catheter is inserted and advanced through a blood vessel. Once the catheter reaches the aneurysm, tiny coils are used to block blood flow into the aneurysm.  Surgical clipping. During surgery, a clip is placed at the base of the aneurysm. The clip prevents blood from continuing to enter the aneurysm. Ruptured Aneurysms Immediate emergency surgery may be needed to help prevent damage to the brain and to reduce the risk of rebleeding. Timing of treatment is an important factor in the prevention of complications. Successful early treatment of a ruptured aneurysm (within the first 3 days of a bleed) helps to prevent rebleeding and blood vessel spasm. In some cases, there may be a reason to treat later (10-14 days after a rupture). Many things are considered when making this decision, and each case is handled individually. HOME CARE INSTRUCTIONS  Take medicines only as instructed by your health care provider.  Eat healthy foods. It is recommended that you eat 5 or more servings of fruits and vegetables each day. Foods may need to be a special consistency (soft or pureed), or small bites may need to be taken if you have had a ruptured aneurysm or stroke. Certain dietary changes may be advised to address high blood pressure, high cholesterol, diabetes,  or obesity.  Food choices that are low in salt (sodium), saturated fat, trans fat, and cholesterol are recommended to manage high blood pressure.  Food choices that are high in fiber and low in saturated fat, trans fat, and cholesterol are recommended to control cholesterol levels.  Controlling carbohydrate and sugar intake is recommended to manage diabetes.  Reducing calorie intake and making food choices that are low in sodium, saturated fat, trans fat, and cholesterol are recommended to manage obesity.  Maintain a healthy weight.  Stay physically active. It is recommended that you get at least 30 minutes of activity on most or all days.  Do not smoke.  Limit alcohol use. Moderate alcohol use is considered to be:  No more than 2 drinks each day for men.  No more than 1 drink each day for nonpregnant women.  Stop drug abuse.  A safe home environment is important to reduce the risk of falls. Your health care provider may arrange for specialists to evaluate your home. Having grab bars in the bedroom and bathroom is often important. Your health care provider may arrange for special equipment to be used at home, such as raised toilets and a seat for the shower.  Physical, occupational, and speech therapy. Ongoing therapy may be needed to maximize your recovery after a ruptured aneurysm or stroke.  If you have been advised to use a walker or a cane, use it at all times. Be sure to keep your therapy appointments.  Follow all instructions for follow-up with your health care provider. This is very important. This includes any referrals, physical therapy, rehabilitation, and laboratory tests. Proper follow-up may prevent an aneurysm rupture or a stroke. SEEK IMMEDIATE MEDICAL CARE IF:  You have a sudden, severe headache with no known cause.  You have sudden nausea or vomiting with a severe headache.  You have sudden weakness or numbness of the face, arm, or leg, especially on one side of  the body.  You have sudden trouble walking or difficulty moving arms or legs.  You have sudden confusion.  You have trouble speaking or understanding.  You have sudden trouble seeing in one or both eyes.  You have a sudden loss of balance or coordination.  You have a stiff neck.  You have difficulty breathing.  You have a partial or total loss of consciousness. Any of these symptoms may represent a serious problem that is an emergency. Do not wait to see if the symptoms will go away. Get medical help at once. Call your local emergency services (911 in [place] Do not drive yourself to the hospital. Document Released: 01/31/2002 Document Revised: 09/25/2013 Document Reviewed: 10/27/2012 [facility] Patient Information 2015 [place], [place]. This information is not intended to replace advice given to you by your health care provider. Make sure you discuss any questions you have with your health care provider.

## 2014-06-28 NOTE — Progress Notes (Signed)
Pre visit review using our clinic review tool, if applicable. No additional management support is needed unless otherwise documented below in the visit note. 

## 2014-06-29 LAB — CBC
HCT: 39.2 % (ref 36.0–46.0)
Hemoglobin: 13 g/dL (ref 12.0–15.0)
MCHC: 33.2 g/dL (ref 30.0–36.0)
MCV: 80.2 fl (ref 78.0–100.0)
Platelets: 308 10*3/uL (ref 150.0–400.0)
RBC: 4.89 Mil/uL (ref 3.87–5.11)
RDW: 13.3 % (ref 11.5–15.5)
WBC: 8.3 10*3/uL (ref 4.0–10.5)

## 2014-07-06 ENCOUNTER — Telehealth: Payer: Self-pay | Admitting: Family Medicine

## 2014-07-06 NOTE — Telephone Encounter (Signed)
Called the patient informed of PCP instructions.  The patient states her arm/wrist is much better and please do cancel order.

## 2014-07-06 NOTE — Telephone Encounter (Signed)
Called the patient left message to call back 

## 2014-07-06 NOTE — Telephone Encounter (Signed)
-----   Message from Mosie Lukes, MD sent at 07/05/2014 10:40 PM EST ----- Please check with this patient, she had a bruise and raised lesion on her arm at the wrist, insurance is refusing to pay for the imaging ordered. See if she is better or if she has any new complaints. If she is better I will cancel test if she is worse I can use the new symptoms to get ultrasound paid for. Thanks. Dr B ----- Message -----    From: Synthia Innocent    Sent: 07/02/2014   8:09 AM      To: Mosie Lukes, MD  Insurance will not cover the venous with this dx, suggest something else? Thanks

## 2014-07-06 NOTE — Telephone Encounter (Signed)
Caller name:Dacie  Relationship to patient:self Can be reached:[phone]   Reason for call:Returning call to Ms. Robin.

## 2014-07-07 NOTE — Telephone Encounter (Signed)
Please cancel her ultrasound order

## 2014-07-08 DIAGNOSIS — M6740 Ganglion, unspecified site: Secondary | ICD-10-CM | POA: Insufficient documentation

## 2014-07-08 NOTE — Assessment & Plan Note (Signed)
Mild elevation noted today. Encouraged heart healthy diet such as the DASH diet and exercise as tolerated.

## 2014-07-08 NOTE — Assessment & Plan Note (Signed)
New lesion left wrist, no trauma . Raised nontender lesion on palmer wrist. Likely ganglion cyst. Encouraged ice and Salon Pas bid and proceed with Ultrasound due to patient concerns.

## 2014-07-08 NOTE — Progress Notes (Signed)
Laura Gentry  [id number] 10-08-1955 07/08/2014      Progress Note-Follow Up  Subjective  Chief Complaint  Chief Complaint  Patient presents with  . Knot on (L) wrist    noticed on Tues    HPI  Patient is a 59 y.o. female in today for routine medical care. Patients in today to discuss lesion on left wrist. She denies any injury or trauma. Notes the lesions appeared and is raised but nontender on the left wrist. No warmth, erythema or bruising. No history of similar lesion. No radicular symptoms. No significant discomfort. Denies CP/palp/SOB/HA/congestion/fevers/GI or GU c/o. Taking meds as prescribed  Past Medical History  Diagnosis Date  . Diabetes   . High blood pressure   . Kidney stones   . Migraines   . Colon polyps   . Thyroid disease   . Brain aneurysm 03/19/13    Past Surgical History  Procedure Laterality Date  . Brain surgery  03/19/13  . Inner ear surgery      Lost Hearing  . Cholecystectomy  2013  . Appendectomy    . Tonsillectomy and adenoidectomy    . Vaginal hysterectomy      Family History  Problem Relation Age of Onset  . Emphysema Maternal Aunt     never smoked, spouse did  . Asthma Mother   . Allergies Mother   . Pancreatic cancer Brother   . Lung cancer Father     smoked  . Throat cancer Father     smoked    History   Social History  . Marital Status: Single    Spouse Name: N/A  . Number of Children: N/A  . Years of Education: N/A   Occupational History  . Pawn Broker     Social History Main Topics  . Smoking status: Former Smoker -- 0.50 packs/day for 40 years    Types: Cigarettes    Quit date: 05/26/2011  . Smokeless tobacco: Never Used  . Alcohol Use: No  . Drug Use: No  . Sexual Activity: Not on file   Other Topics Concern  . Not on file   Social History Narrative    Current Outpatient Prescriptions on File Prior to Visit  Medication Sig Dispense Refill  . Cyanocobalamin (B-12) 2500 MCG TABS Take by mouth.     . diclofenac (VOLTAREN) 75 MG EC tablet Take 75 mg by mouth 2 (two) times daily.    Marland Kitchen lamoTRIgine (LAMICTAL) 100 MG tablet Take 100 mg by mouth daily.    . propranolol (INNOPRAN XL) 120 MG 24 hr capsule Take 120 mg by mouth at bedtime.     No current facility-administered medications on file prior to visit.    Allergies  Allergen Reactions  . Penicillins Anaphylaxis  . Sulfa Antibiotics Hives, Itching and Swelling    Review of Systems  Review of Systems  Constitutional: Negative for fever and malaise/fatigue.  HENT: Negative for congestion.   Eyes: Negative for discharge.  Respiratory: Negative for shortness of breath.   Cardiovascular: Negative for chest pain, palpitations and leg swelling.  Gastrointestinal: Negative for nausea, abdominal pain and diarrhea.  Genitourinary: Negative for dysuria.  Musculoskeletal: Positive for joint pain. Negative for falls.  Skin: Negative for rash.  Neurological: Negative for loss of consciousness and headaches.  Endo/Heme/Allergies: Negative for polydipsia.  Psychiatric/Behavioral: Negative for depression and suicidal ideas. The patient is not nervous/anxious and does not have insomnia.     Objective  BP 147/83 mmHg  Pulse 75  Temp(Src) 98.3 F (36.8 C) (Oral)  Ht 5\' 7"  (1.702 m)  Wt 166 lb 6.4 oz (75.479 kg)  BMI 26.06 kg/m2  SpO2 96%  Physical Exam  Physical Exam  Constitutional: She is oriented to person, place, and time and well-developed, well-nourished, and in no distress. No distress.  HENT:  Head: Normocephalic and atraumatic.  Eyes: Conjunctivae are normal.  Neck: Neck supple. No thyromegaly present.  Cardiovascular: Normal rate, regular rhythm and normal heart sounds.   No murmur heard. Pulmonary/Chest: Effort normal and breath sounds normal. She has no wheezes.  Abdominal: She exhibits no distension and no mass.  Musculoskeletal: She exhibits no edema.  Raised circumscribed lesion on palmer surface of wrist.  Nontender, no erythema or fluctuance  Lymphadenopathy:    She has no cervical adenopathy.  Neurological: She is alert and oriented to person, place, and time.  Skin: Skin is warm and dry. No rash noted. She is not diaphoretic.  Psychiatric: Memory, affect and judgment normal.    No results found for: TSH Lab Results  Component Value Date   WBC 8.3 06/28/2014   HGB 13.0 06/28/2014   HCT 39.2 06/28/2014   MCV 80.2 06/28/2014   PLT 308.0 06/28/2014   Lab Results  Component Value Date   CREATININE 0.9 07/10/2013   BUN 13 07/10/2013   NA 137 07/10/2013   K 3.9 07/10/2013   CL 101 07/10/2013   CO2 27 07/10/2013   No results found for: ALT, AST, GGT, ALKPHOS, BILITOT No results found for: CHOL No results found for: HDL No results found for: LDLCALC No results found for: TRIG No results found for: CHOLHDL   Assessment & Plan  HBP (high blood pressure) Mild elevation noted today. Encouraged heart healthy diet such as the DASH diet and exercise as tolerated.    Ganglion cyst New lesion left wrist, no trauma . Raised nontender lesion on palmer wrist. Likely ganglion cyst. Encouraged ice and Salon Pas bid and proceed with Ultrasound due to patient concerns.

## 2014-07-09 NOTE — Telephone Encounter (Signed)
Ultrasound canceled.

## 2014-08-16 ENCOUNTER — Encounter: Payer: Self-pay | Admitting: *Deleted

## 2014-08-20 ENCOUNTER — Telehealth: Payer: Self-pay | Admitting: *Deleted

## 2014-08-20 NOTE — Telephone Encounter (Signed)
Patient canceled her new patient appointment with Dr. Tomi Likens. Referring providers office Katharine Look notified)

## 2014-08-21 ENCOUNTER — Ambulatory Visit: Payer: Managed Care, Other (non HMO) | Admitting: Neurology

## 2014-09-27 ENCOUNTER — Ambulatory Visit: Payer: Managed Care, Other (non HMO) | Admitting: Family Medicine

## 2015-03-07 ENCOUNTER — Ambulatory Visit (INDEPENDENT_AMBULATORY_CARE_PROVIDER_SITE_OTHER): Payer: Managed Care, Other (non HMO) | Admitting: Family Medicine

## 2015-03-07 ENCOUNTER — Inpatient Hospital Stay (HOSPITAL_COMMUNITY)
Admission: EM | Admit: 2015-03-07 | Discharge: 2015-03-10 | DRG: 638 | Disposition: A | Discharge status: Home / Self Care | Payer: Managed Care, Other (non HMO) | Attending: Internal Medicine | Admitting: Internal Medicine

## 2015-03-07 ENCOUNTER — Encounter (HOSPITAL_COMMUNITY): Payer: Self-pay | Admitting: *Deleted

## 2015-03-07 ENCOUNTER — Emergency Department (HOSPITAL_COMMUNITY): Payer: Managed Care, Other (non HMO)

## 2015-03-07 ENCOUNTER — Encounter: Payer: Self-pay | Admitting: Family Medicine

## 2015-03-07 VITALS — BP 112/70 | HR 102 | Temp 99.6°F | Ht 67.0 in | Wt 153.0 lb

## 2015-03-07 DIAGNOSIS — E870 Hyperosmolality and hypernatremia: Secondary | ICD-10-CM | POA: Diagnosis present

## 2015-03-07 DIAGNOSIS — R9089 Other abnormal findings on diagnostic imaging of central nervous system: Secondary | ICD-10-CM

## 2015-03-07 DIAGNOSIS — R739 Hyperglycemia, unspecified: Secondary | ICD-10-CM | POA: Diagnosis not present

## 2015-03-07 DIAGNOSIS — R0602 Shortness of breath: Secondary | ICD-10-CM | POA: Diagnosis present

## 2015-03-07 DIAGNOSIS — E860 Dehydration: Secondary | ICD-10-CM | POA: Diagnosis present

## 2015-03-07 DIAGNOSIS — N3000 Acute cystitis without hematuria: Secondary | ICD-10-CM | POA: Diagnosis present

## 2015-03-07 DIAGNOSIS — E1101 Type 2 diabetes mellitus with hyperosmolarity with coma: Secondary | ICD-10-CM | POA: Diagnosis present

## 2015-03-07 DIAGNOSIS — G479 Sleep disorder, unspecified: Secondary | ICD-10-CM | POA: Insufficient documentation

## 2015-03-07 DIAGNOSIS — E871 Hypo-osmolality and hyponatremia: Secondary | ICD-10-CM | POA: Diagnosis present

## 2015-03-07 DIAGNOSIS — R509 Fever, unspecified: Secondary | ICD-10-CM | POA: Diagnosis not present

## 2015-03-07 DIAGNOSIS — R296 Repeated falls: Secondary | ICD-10-CM | POA: Diagnosis present

## 2015-03-07 DIAGNOSIS — W19XXXA Unspecified fall, initial encounter: Secondary | ICD-10-CM | POA: Diagnosis present

## 2015-03-07 DIAGNOSIS — E785 Hyperlipidemia, unspecified: Secondary | ICD-10-CM | POA: Diagnosis not present

## 2015-03-07 DIAGNOSIS — Z87891 Personal history of nicotine dependence: Secondary | ICD-10-CM

## 2015-03-07 DIAGNOSIS — R7309 Other abnormal glucose: Secondary | ICD-10-CM

## 2015-03-07 DIAGNOSIS — IMO0001 Reserved for inherently not codable concepts without codable children: Secondary | ICD-10-CM | POA: Diagnosis present

## 2015-03-07 DIAGNOSIS — R110 Nausea: Secondary | ICD-10-CM | POA: Diagnosis present

## 2015-03-07 DIAGNOSIS — E876 Hypokalemia: Secondary | ICD-10-CM | POA: Diagnosis present

## 2015-03-07 DIAGNOSIS — N390 Urinary tract infection, site not specified: Secondary | ICD-10-CM | POA: Diagnosis present

## 2015-03-07 DIAGNOSIS — G43909 Migraine, unspecified, not intractable, without status migrainosus: Secondary | ICD-10-CM | POA: Diagnosis present

## 2015-03-07 DIAGNOSIS — R5383 Other fatigue: Secondary | ICD-10-CM | POA: Insufficient documentation

## 2015-03-07 DIAGNOSIS — R531 Weakness: Secondary | ICD-10-CM

## 2015-03-07 DIAGNOSIS — E1142 Type 2 diabetes mellitus with diabetic polyneuropathy: Secondary | ICD-10-CM | POA: Insufficient documentation

## 2015-03-07 DIAGNOSIS — G629 Polyneuropathy, unspecified: Secondary | ICD-10-CM

## 2015-03-07 DIAGNOSIS — E559 Vitamin D deficiency, unspecified: Secondary | ICD-10-CM | POA: Insufficient documentation

## 2015-03-07 DIAGNOSIS — E1100 Type 2 diabetes mellitus with hyperosmolarity without nonketotic hyperglycemic-hyperosmolar coma (NKHHC): Secondary | ICD-10-CM | POA: Diagnosis not present

## 2015-03-07 DIAGNOSIS — E1165 Type 2 diabetes mellitus with hyperglycemia: Secondary | ICD-10-CM | POA: Diagnosis present

## 2015-03-07 LAB — COMPREHENSIVE METABOLIC PANEL
ALT: 30 U/L (ref 14–54)
AST: 37 U/L (ref 15–41)
Albumin: 3.4 g/dL — ABNORMAL LOW (ref 3.5–5.0)
Alkaline Phosphatase: 120 U/L (ref 38–126)
Anion gap: 13 (ref 5–15)
BUN: 9 mg/dL (ref 6–20)
CO2: 23 mmol/L (ref 22–32)
Calcium: 9 mg/dL (ref 8.9–10.3)
Chloride: 91 mmol/L — ABNORMAL LOW (ref 101–111)
Creatinine, Ser: 0.99 mg/dL (ref 0.44–1.00)
GFR calc Af Amer: >60 mL/min (ref >60)
GFR calc non Af Amer: >60 mL/min (ref >60)
Glucose, Bld: 474 mg/dL — ABNORMAL HIGH (ref 65–99)
Potassium: 3.6 mmol/L (ref 3.5–5.1)
Sodium: 127 mmol/L — ABNORMAL LOW (ref 135–145)
Total Bilirubin: 0.9 mg/dL (ref 0.3–1.2)
Total Protein: 6.9 g/dL (ref 6.5–8.1)

## 2015-03-07 LAB — URINALYSIS, ROUTINE W REFLEX MICROSCOPIC
Bilirubin Urine: NEGATIVE
Glucose, UA: >1000 mg/dL — AB
Ketones, ur: 40 mg/dL — AB
Nitrite: NEGATIVE
Protein, ur: 30 mg/dL — AB
Specific Gravity, Urine: 1.022 (ref 1.005–1.030)
Urobilinogen, UA: 0.2 mg/dL (ref 0.0–1.0)
pH: 5.5 (ref 5.0–8.0)

## 2015-03-07 LAB — CBG MONITORING, ED
Glucose-Capillary: 301 mg/dL — ABNORMAL HIGH (ref 65–99)
Glucose-Capillary: 193 mg/dL — ABNORMAL HIGH (ref 65–99)

## 2015-03-07 LAB — URINE MICROSCOPIC-ADD ON

## 2015-03-07 LAB — POCT INFLUENZA A/B
Influenza A, POC: NEGATIVE
Influenza B, POC: NEGATIVE

## 2015-03-07 LAB — CBC
HCT: 37.6 % (ref 36.0–46.0)
Hemoglobin: 12.9 g/dL (ref 12.0–15.0)
MCH: 27.7 pg (ref 26.0–34.0)
MCHC: 34.3 g/dL (ref 30.0–36.0)
MCV: 80.7 fL (ref 78.0–100.0)
Platelets: 226 10*3/uL (ref 150–400)
RBC: 4.66 MIL/uL (ref 3.87–5.11)
RDW: 12.6 % (ref 11.5–15.5)
WBC: 16.9 10*3/uL — ABNORMAL HIGH (ref 4.0–10.5)

## 2015-03-07 LAB — I-STAT TROPONIN, ED: Troponin i, poc: 0 ng/mL (ref 0.00–0.08)

## 2015-03-07 MED ORDER — ACETAMINOPHEN 500 MG PO TABS
500.0000 mg | ORAL_TABLET | Freq: Four times a day (QID) | ORAL | Status: DC | PRN
  Administered 2015-03-07 – 2015-03-08 (×2): 500 mg via ORAL
  Filled 2015-03-07 (×2): qty 1

## 2015-03-07 MED ORDER — LAMOTRIGINE 25 MG PO TABS
200.0000 mg | ORAL_TABLET | Freq: Two times a day (BID) | ORAL | Status: DC
  Administered 2015-03-07 – 2015-03-10 (×6): 200 mg via ORAL
  Filled 2015-03-07: qty 8
  Filled 2015-03-07: qty 1
  Filled 2015-03-07 (×5): qty 8

## 2015-03-07 MED ORDER — DIPHENHYDRAMINE HCL 25 MG PO CAPS: 25.0000 mg | ORAL_CAPSULE | ORAL | Status: DC | PRN

## 2015-03-07 MED ORDER — PROMETHAZINE HCL 25 MG/ML IJ SOLN
12.5000 mg | Freq: Four times a day (QID) | INTRAMUSCULAR | Status: DC | PRN
  Administered 2015-03-07 – 2015-03-08 (×3): 12.5 mg via INTRAVENOUS
  Filled 2015-03-07 (×3): qty 1

## 2015-03-07 MED ORDER — INSULIN ASPART 100 UNIT/ML ~~LOC~~ SOLN
5.0000 [IU] | Freq: Once | SUBCUTANEOUS | Status: AC
  Administered 2015-03-07: 5 [IU] via SUBCUTANEOUS
  Filled 2015-03-07: qty 1

## 2015-03-07 MED ORDER — CYANOCOBALAMIN 500 MCG PO TABS
2500.0000 ug | ORAL_TABLET | Freq: Every day | ORAL | Status: DC
  Administered 2015-03-08 – 2015-03-10 (×3): 2500 ug via ORAL
  Filled 2015-03-07 (×3): qty 5

## 2015-03-07 MED ORDER — INSULIN ASPART 100 UNIT/ML ~~LOC~~ SOLN
0.0000 [IU] | SUBCUTANEOUS | Status: DC
  Administered 2015-03-08: 3 [IU] via SUBCUTANEOUS
  Administered 2015-03-08: 8 [IU] via SUBCUTANEOUS
  Administered 2015-03-08 (×2): 3 [IU] via SUBCUTANEOUS
  Administered 2015-03-08 – 2015-03-09 (×3): 5 [IU] via SUBCUTANEOUS
  Administered 2015-03-09 (×4): 3 [IU] via SUBCUTANEOUS
  Administered 2015-03-09: 5 [IU] via SUBCUTANEOUS
  Administered 2015-03-10: 2 [IU] via SUBCUTANEOUS
  Administered 2015-03-10: 3 [IU] via SUBCUTANEOUS
  Administered 2015-03-10: 2 [IU] via SUBCUTANEOUS

## 2015-03-07 MED ORDER — MORPHINE SULFATE (PF) 4 MG/ML IV SOLN
4.0000 mg | Freq: Once | INTRAVENOUS | Status: AC
  Administered 2015-03-07: 4 mg via INTRAVENOUS
  Filled 2015-03-07: qty 1

## 2015-03-07 MED ORDER — PROPRANOLOL HCL ER 60 MG PO CP24
120.0000 mg | ORAL_CAPSULE | Freq: Every day | ORAL | Status: DC
  Administered 2015-03-08 – 2015-03-10 (×3): 120 mg via ORAL
  Filled 2015-03-07 (×3): qty 2

## 2015-03-07 MED ORDER — AMITRIPTYLINE HCL 50 MG PO TABS
50.0000 mg | ORAL_TABLET | Freq: Every day | ORAL | Status: DC
  Administered 2015-03-07 – 2015-03-09 (×3): 50 mg via ORAL
  Filled 2015-03-07 (×3): qty 1

## 2015-03-07 MED ORDER — DEXTROSE 5 % IV SOLN
1.0000 g | INTRAVENOUS | Status: DC
  Administered 2015-03-08 – 2015-03-09 (×2): 1 g via INTRAVENOUS
  Filled 2015-03-07 (×3): qty 10

## 2015-03-07 MED ORDER — DIPHENHYDRAMINE HCL 50 MG/ML IJ SOLN: 25.0000 mg | Freq: Four times a day (QID) | INTRAMUSCULAR | Status: DC | PRN

## 2015-03-07 MED ORDER — DICLOFENAC SODIUM 75 MG PO TBEC
75.0000 mg | DELAYED_RELEASE_TABLET | Freq: Two times a day (BID) | ORAL | Status: DC
  Administered 2015-03-07 – 2015-03-08 (×2): 75 mg via ORAL
  Filled 2015-03-07 (×3): qty 1

## 2015-03-07 MED ORDER — DEXTROSE 5 % IV SOLN
1.0000 g | Freq: Once | INTRAVENOUS | Status: AC
  Administered 2015-03-07: 1 g via INTRAVENOUS
  Filled 2015-03-07: qty 10

## 2015-03-07 MED ORDER — SODIUM CHLORIDE 0.9 % IV BOLUS (SEPSIS): 1000.0000 mL | Freq: Once | INTRAVENOUS | Status: DC

## 2015-03-07 MED ORDER — SODIUM CHLORIDE 0.9 % IV BOLUS (SEPSIS)
1000.0000 mL | Freq: Once | INTRAVENOUS | Status: AC
  Administered 2015-03-07: 1000 mL via INTRAVENOUS

## 2015-03-07 MED ORDER — SODIUM CHLORIDE 0.9 % IV SOLN
INTRAVENOUS | Status: DC
  Administered 2015-03-07 – 2015-03-08 (×2): via INTRAVENOUS

## 2015-03-07 MED ORDER — ACETAMINOPHEN 500 MG PO TABS
1000.0000 mg | ORAL_TABLET | Freq: Once | ORAL | Status: AC
  Administered 2015-03-07: 1000 mg via ORAL
  Filled 2015-03-07: qty 2

## 2015-03-07 MED ORDER — ENOXAPARIN SODIUM 40 MG/0.4ML ~~LOC~~ SOLN
40.0000 mg | SUBCUTANEOUS | Status: DC
  Administered 2015-03-07 – 2015-03-09 (×3): 40 mg via SUBCUTANEOUS
  Filled 2015-03-07 (×3): qty 0.4

## 2015-03-07 NOTE — Progress Notes (Signed)
Pre visit review using our clinic review tool, if applicable. No additional management support is needed unless otherwise documented below in the visit note. 

## 2015-03-07 NOTE — ED Notes (Signed)
Patient reports approx 4 day history of nausea, vomiting, headache, increased thirst, and feeling off balance. Patients stroke scale is negative. 20g(L)AC. Patient with hx of headaches but reports this is worse. Patient with history of pre-diabetes and was controlled with diet and exercise. On EMS CBG 467.

## 2015-03-07 NOTE — ED Notes (Signed)
EDP at bedside  

## 2015-03-07 NOTE — ED Notes (Signed)
CBG 301 mg/dL reported to [facility]

## 2015-03-07 NOTE — ED Notes (Signed)
Patient to CT.

## 2015-03-07 NOTE — Assessment & Plan Note (Addendum)
D/w with ER dr downstairs.---advised to call 911 directly With hx of aneurysm , weakness and falls -- ? Stroke  Flu test neg in office

## 2015-03-07 NOTE — Progress Notes (Signed)
Patient ID: Laura Gentry, female    DOB: Sep 26, 1955  Age: 59 y.o. MRN: [id number]    Subjective:  Subjective HPI Laura Gentry presents with sister c/o increasing slurred speech , several falls over last few days and weakness -- no headache, or chest pain.    Pt unable to balance herself and walk without losing balance.  No head injury with fall per pt.  Pt with hx of aneurysm 2 years ago.  She has been running low grade fever at home.  No congestion or cough.   D/w ER physician down stairs and was advised to call 911 and have ambulance take her to [facility].     Review of Systems  Constitutional: Positive for fever and chills. Negative for diaphoresis, appetite change, fatigue and unexpected weight change.  HENT: Negative for congestion, postnasal drip, sneezing and sore throat.   Eyes: Positive for photophobia. Negative for pain, redness and visual disturbance.  Respiratory: Negative for cough, chest tightness, shortness of breath and wheezing.   Cardiovascular: Negative for chest pain, palpitations and leg swelling.  Gastrointestinal: Positive for abdominal pain. Negative for nausea, vomiting, constipation and blood in stool.  Endocrine: Negative for cold intolerance, heat intolerance, polydipsia, polyphagia and polyuria.  Genitourinary: Negative for dysuria, frequency and difficulty urinating.  Neurological: Positive for tremors, speech difficulty, weakness and headaches. Negative for dizziness, syncope, light-headedness and numbness.    History Past Medical History  Diagnosis Date  . Diabetes ([place])   . High blood pressure   . Kidney stones   . Migraines   . Colon polyps   . Thyroid disease   . Brain aneurysm 03/19/13    She has past surgical history that includes Brain surgery (03/19/13); Inner ear surgery; Cholecystectomy (2013); Appendectomy; Tonsillectomy and adenoidectomy; and Vaginal hysterectomy.   Her family history includes Allergies in her mother; Asthma in her mother;  Emphysema in her maternal aunt; Lung cancer in her father; Pancreatic cancer in her brother; Throat cancer in her father.She reports that she quit smoking about 3 years ago. Her smoking use included Cigarettes. She has a 20 pack-year smoking history. She has never used smokeless tobacco. She reports that she does not drink alcohol or use illicit drugs.  No current facility-administered medications on file prior to visit.   Current Outpatient Prescriptions on File Prior to Visit  Medication Sig Dispense Refill  . amitriptyline (ELAVIL) 25 MG tablet Take 50 mg by mouth at bedtime.     . Cyanocobalamin (B-12) 2500 MCG TABS Take 2,500 mg by mouth daily at 12 noon.     . diclofenac (VOLTAREN) 75 MG EC tablet Take 75 mg by mouth 2 (two) times daily.    . propranolol (INNOPRAN XL) 120 MG 24 hr capsule Take 120 mg by mouth daily at 12 noon.        Objective:  Objective Physical Exam  Constitutional: She is oriented to person, place, and time. She appears well-developed and well-nourished.  HENT:  Head: Normocephalic and atraumatic.  Eyes: Conjunctivae and EOM are normal.  Neck: Normal range of motion. Neck supple. No JVD present. Carotid bruit is not present. No thyromegaly present.  Cardiovascular: Normal rate, regular rhythm and normal heart sounds.   No murmur heard. Pulmonary/Chest: Effort normal and breath sounds normal. No respiratory distress. She has no wheezes. She has no rales. She exhibits no tenderness.  Musculoskeletal: She exhibits no edema.  Neurological: She is alert and oriented to person, place, and time. No cranial nerve deficit.  Coordination and gait abnormal.  Weakness in both legs Slight weakness in L arm Pt unable to walk without being off balance  Psychiatric: She has a normal mood and affect. Her behavior is normal. Judgment and thought content normal.  Vitals reviewed.  BP 112/70 mmHg  Pulse 102  Temp(Src) 99.6 F (37.6 C) (Oral)  Ht 5\' 7"  (1.702 m)  Wt 153 lb  (69.4 kg)  BMI 23.96 kg/m2  SpO2 97% Wt Readings from Last 3 Encounters:  03/07/15 154 lb 11.2 oz (70.171 kg)  03/07/15 153 lb (69.4 kg)  06/28/14 166 lb 6.4 oz (75.479 kg)     Lab Results  Component Value Date   WBC 16.9* 03/07/2015   HGB 12.9 03/07/2015   HCT 37.6 03/07/2015   PLT 226 03/07/2015   GLUCOSE 474* 03/07/2015   ALT 30 03/07/2015   AST 37 03/07/2015   NA 127* 03/07/2015   K 3.6 03/07/2015   CL 91* 03/07/2015   CREATININE 0.99 03/07/2015   BUN 9 03/07/2015   CO2 23 03/07/2015    Dg Chest 2 View  06/23/2013  CLINICAL DATA:  Cough, shortness of breath for few weeks, history hypertension, diabetes EXAM: CHEST  2 VIEW COMPARISON:  None FINDINGS: Upper normal heart size. Mediastinal contours and pulmonary vascularity normal. Peribronchial thickening without infiltrate, pleural effusion or pneumothorax. Bones appear slightly demineralized. No acute osseous findings. IMPRESSION: Minimal bronchitic changes without infiltrate. Electronically Signed   By: Lavonia Dana M.D.   On: 06/23/2013 17:40     Assessment & Plan:  Plan I am having Ms. Cappucci maintain her B-12, propranolol, diclofenac, amitriptyline, and lamoTRIgine.  Meds ordered this encounter  Medications  . lamoTRIgine (LAMICTAL) 200 MG tablet    Sig: Take 200 mg by mouth 2 (two) times daily.    Problem List Items Addressed This Visit    Weakness    D/w with ER dr downstairs.---advised to call 911 directly With hx of aneurysm , weakness and falls -- ? Stroke  Flu test neg in office       Other Visit Diagnoses    Fever, unspecified    -  Primary    Relevant Orders    POCT Influenza A/B (Completed)       Follow-up: No Follow-up on file.  Garnet Koyanagi, DO         .+0+   ++++++++.

## 2015-03-07 NOTE — H&P (Signed)
[facility] History and Physical  Laura Gentry GDJ:[id number] DOB: 09-07-1955 DOA: 03/07/2015  Referring physician: ED PCP: Garnet Koyanagi, DO   Chief Complaint: Falls with worsening headache and fever  HPI:  Patient is a 59 year old Caucasian female with a past medical history of being the diabetic, history of kidney stones, chronic headaches, and brain aneurysm status post clipping; who presented from her primary care's office after complaining of a three-day history of repeated falls with headache and fever. While at home her fever got as high as 101F yesterday. Upon arrival to the emergency department patient was noted to have a blood glucose level of 474 and a urinalysis which was positive. Influenza screens were negative and initial CT scan showed no acute changes to suggest a stroke. Patient states that she was previously labeled as prediabetic approximately 2 years ago. She made diet and lifestyle changes at that time.     Review of Systems: negative for the following  Constitutional: Denies fever, chills, diaphoresis, appetite change and fatigue.  HEENT: Positive for dry mouth. Denies photophobia, eye pain, redness, hearing loss, ear pain, congestion, sore throat, rhinorrhea, sneezing, mouth sores, trouble swallowing, neck pain, neck stiffness and tinnitus.  Respiratory: Denies SOB, DOE, cough, chest tightness, and wheezing.  Cardiovascular: Denies chest pain, palpitations and leg swelling.  Gastrointestinal: Denies nausea, vomiting, abdominal pain, diarrhea, constipation, blood in stool and abdominal distention.  Genitourinary: Denies dysuria, urgency, frequency, hematuria, flank pain and difficulty urinating.  Musculoskeletal: Denies myalgias, back pain, joint swelling, arthralgias and gait problem.  Skin: Denies pallor, rash and wound.  Neurological: Positive for Falls and numbness. Denies dizziness, seizures, syncope, weakness, light-headedness, and headaches.   Hematological: Denies adenopathy. Easy bruising, personal or family bleeding history  Psychiatric/Behavioral: Denies suicidal ideation, mood changes, confusion, nervousness, sleep disturbance and agitation       Past Medical History  Diagnosis Date  . Diabetes ([place])   . High blood pressure   . Kidney stones   . Migraines   . Colon polyps   . Thyroid disease   . Brain aneurysm 03/19/13     Past Surgical History  Procedure Laterality Date  . Brain surgery  03/19/13  . Inner ear surgery      Lost Hearing  . Cholecystectomy  2013  . Appendectomy    . Tonsillectomy and adenoidectomy    . Vaginal hysterectomy        Social History:  reports that she quit smoking about 3 years ago. Her smoking use included Cigarettes. She has a 20 pack-year smoking history. She has never used smokeless tobacco. She reports that she does not drink alcohol or use illicit drugs. where does patient live--home Can patient participate in ADLs? yes  Allergies  Allergen Reactions  . Amoxicillin Shortness Of Breath  . Bee Venom Shortness Of Breath  . Penicillins Anaphylaxis    Has patient had a PCN reaction causing immediate rash, facial/tongue/throat swelling, SOB or lightheadedness with hypotension:  Has patient had a PCN reaction causing severe rash involving mucus membranes or skin necrosis:  Has patient had a PCN reaction that required hospitalization  Has patient had a PCN reaction occurring within the last 10 years: If all of the above answers are "NO", then may proceed with Cephalosporin use.  . Pneumococcal Vaccines     Per patient due to Brain Aneurysm  . Sulfa Antibiotics Hives, Itching and Swelling    Family History  Problem Relation Age of Onset  . Emphysema Maternal Aunt  never smoked, spouse did  . Asthma Mother   . Allergies Mother   . Pancreatic cancer Brother   . Lung cancer Father     smoked  . Throat cancer Father     smoked      FAMILY HISTORY  When  questioned  Directly-patient reports  No family history of HTN, CVA ,DIABETES, TB, Cancer CAD, Bleeding Disorders, Sickle Cell, diabetes, anemia, asthma,   Prior to Admission medications   Medication Sig Start Date End Date Taking? Authorizing Provider  acetaminophen (TYLENOL) 500 MG tablet Take 500 mg by mouth every 6 (six) hours as needed for mild pain or moderate pain.   Yes Historical Provider, MD  amitriptyline (ELAVIL) 25 MG tablet Take 50 mg by mouth at bedtime.    Yes Historical Provider, MD  Cyanocobalamin (B-12) 2500 MCG TABS Take 2,500 mg by mouth daily at 12 noon.    Yes Historical Provider, MD  diclofenac (VOLTAREN) 75 MG EC tablet Take 75 mg by mouth 2 (two) times daily.   Yes Historical Provider, MD  lamoTRIgine (LAMICTAL) 200 MG tablet Take 200 mg by mouth 2 (two) times daily.   Yes Historical Provider, MD  propranolol (INNOPRAN XL) 120 MG 24 hr capsule Take 120 mg by mouth daily at 12 noon.    Yes Historical Provider, MD     Physical Exam: Filed Vitals:   03/07/15 1230 03/07/15 1300 03/07/15 1400 03/07/15 1652  BP: 112/55 97/65 109/56 108/49  Pulse: 86 85 79 80  Temp:      TempSrc:      Resp: 19 21  16   SpO2: 95% 96% 95% 97%     Constitutional: Vital signs reviewed. Patient is a well-developed and well-nourished in no acute distress and cooperative with exam. Alert and oriented x3.  Head: Normocephalic and atraumatic  Ear: TM normal bilaterally  Mouth: no erythema or exudates, dry mucous membranes Eyes: PERRL, EOMI, conjunctivae normal, No scleral icterus.  Neck: Supple, Trachea midline normal ROM, No JVD, mass, thyromegaly, or carotid bruit present.  Cardiovascular: RRR, S1 normal, S2 normal, no MRG, pulses symmetric and intact bilaterally  Pulmonary/Chest: CTAB, no wheezes, rales, or rhonchi  Abdominal: Soft. Non-tender, non-distended, bowel sounds are normal, no masses, organomegaly, or guarding present.  GU: no CVA tenderness Musculoskeletal: No joint  deformities, erythema, or stiffness, ROM full and no nontender Ext: no edema and no cyanosis, pulses palpable bilaterally (DP and PT)  Hematology: no cervical, inginal, or axillary adenopathy.  Neurological: A&O x3, Strenght is normal and symmetric bilaterally, cranial nerve II-XII are grossly intact, no focal motor deficit, decreased sensation to light touch bilaterally.  Skin: Warm, dry and intact. No rash, cyanosis, or clubbing.  Psychiatric: Normal mood and affect. speech and behavior is normal. Judgment and thought content normal. Cognition and memory are normal.      Data Review   Micro Results No results found for this or any previous visit (from the past 240 hour(s)).  Radiology Reports Dg Chest 2 View  03/07/2015  CLINICAL DATA:  Four-day history of nausea, vomiting and headaches. EXAM: CHEST  2 VIEW COMPARISON:  June 23, 2013 FINDINGS: The heart size and mediastinal contours are within normal limits. There is no focal infiltrate, pulmonary edema, or pleural effusion. Minimal atelectasis of bilateral lung bases are noted. The visualized skeletal structures are unremarkable. IMPRESSION: No active cardiopulmonary disease. Electronically Signed   By: Abelardo Diesel M.D.   On: 03/07/2015 13:44   Ct Head Wo Contrast  03/07/2015  CLINICAL DATA:  Recent falls. Hyperglycemia with vomiting. Altered mental status EXAM: CT HEAD WITHOUT CONTRAST TECHNIQUE: Contiguous axial images were obtained from the base of the skull through the vertex without intravenous contrast. COMPARISON:  None. FINDINGS: The ventricles are normal in size and configuration. There is a small cavum septum pellucidum, an anatomic variant. The patient has had a previous right temporal craniotomy with a surgical clip placed in the periphery of the anterior right temporal lobe region. There is moderate artifact from the clip in this area. With the proviso that there is artifact in the anterior right temporal/posterior right  frontal regions, there is no apparent hemorrhage, extra-axial fluid collection, or midline shift. There is a posterior fossa extra-axial/arachnoid cyst measuring 1.3 x 1.1 cm. No other evidence of mass. In areas that can be assessed, the gray-white compartments appear normal. There is no acute infarct evident. The bony calvarium appears intact except for the postoperative change in the right temporal region. The mastoid air cells are clear. IMPRESSION: Postoperative change on the right with artifact from the clip in the periphery of the right posterior frontal -anterior temporal region. No demonstrable intracranial hemorrhage, subdural or epidural fluid, or focal gray - white compartment lesions/acute appearing infarct. There is an arachnoid cyst in the posterior fossa region, a benign finding. No intra-axial mass. Electronically Signed   By: Lowella Grip III M.D.   On: 03/07/2015 14:29     CBC  Recent Labs Lab 03/07/15 1305  WBC 16.9*  HGB 12.9  HCT 37.6  PLT 226  MCV 80.7  MCH 27.7  MCHC 34.3  RDW 12.6    Chemistries   Recent Labs Lab 03/07/15 1305  NA 127*  K 3.6  CL 91*  CO2 23  GLUCOSE 474*  BUN 9  CREATININE 0.99  CALCIUM 9.0  AST 37  ALT 30  ALKPHOS 120  BILITOT 0.9   ------------------------------------------------------------------------------------------------------------------ estimated creatinine clearance is 59.5 mL/min (by C-G formula based on Cr of 0.99). ------------------------------------------------------------------------------------------------------------------ No results for input(s): HGBA1C in the last 72 hours. ------------------------------------------------------------------------------------------------------------------ No results for input(s): CHOL, HDL, LDLCALC, TRIG, CHOLHDL, LDLDIRECT in the last 72 hours. ------------------------------------------------------------------------------------------------------------------ No results for  input(s): TSH, T4TOTAL, T3FREE, THYROIDAB in the last 72 hours.  Invalid input(s): FREET3 ------------------------------------------------------------------------------------------------------------------ No results for input(s): VITAMINB12, FOLATE, FERRITIN, TIBC, IRON, RETICCTPCT in the last 72 hours.  Coagulation profile No results for input(s): INR, PROTIME in the last 168 hours.  No results for input(s): DDIMER in the last 72 hours.  Cardiac Enzymes No results for input(s): CKMB, TROPONINI, MYOGLOBIN in the last 168 hours.  Invalid input(s): CK ------------------------------------------------------------------------------------------------------------------ Invalid input(s): POCBNP   CBG:  Recent Labs Lab 03/07/15 1651  GLUCAP 301*       FXT:[id number]   Assessment/Plan  Hyperglycemia- initial blood glucose on arrival was 474. No anion gap present. Pancreas history of being prediabetic per patient. Patient states that she wanted to try diet and exercise initially. - Hemoglobin A1c pending -SSI with Accu-Cheks every 4 hours on moderate scale. -Hypo-glycemic protocol -Bolus normal saline 1 L (total of 2 L given); then changed her rate of 100 mL per hour for the next 24 hours - Diabetic education consult ordered in am  UTI (urinary tract infection)- Positive U/A with elevated white blood cell count.  Urine culture pending follow-up results will continue ceftriaxone for tonight as patient is unable to tolerate by mouth. Patient has a penicillin allergy Patient noted some slight itching on her arm from ceftriaxone denies any other  significant reaction. -Continue Rocephin at this time and de-escalate to by mouth medication when able -Benadryl for itching symptoms  Nausea with vomiting- unable to keep by mouth meds down at this time. Suspect secondary to recent infection with signs of hyperglycemia. - Nothing by mouth for now except for ice chips and will advance as  tolerated - Phenergan prn  Hyponatremia- probable pseudohyponatremia secondary to patient's hyperglycemia. Correcting for fluid deficits at this time    Falls- new problem which is possibly secondary to acute infection and hyperglycemia. -Treatment as seen above - checking orthostatics -Physical therapy to eval and treat  Neuropathy ([place])- clinically significant on physical exam with decrease oral sensation on the bilateral feet. Suspect related to history of diabetes.    Code Status:   full Family Communication: bedside Disposition Plan: admit for observation  Total time spent 55 minutes.Greater than 50% of this time was spent in counseling, explanation of diagnosis, planning of further management, and coordination of care  [facility] Pager [phone]  If 7PM-7AM, please contact night-coverage [email or web address] Password [facility] 03/07/2015, 5:44 PM

## 2015-03-07 NOTE — ED Provider Notes (Signed)
CSN: [id number]     Arrival date & time 03/07/15  1213 History   First MD Initiated Contact with Patient 03/07/15 1216     Chief Complaint  Patient presents with  . Headache  . Hyperglycemia  . Nausea     (Consider location/radiation/quality/duration/timing/severity/associated sxs/prior Treatment) HPI Comments: Feeling bad for about a week. States intermittent headaches, vomiting, inability to take her meds. Hx of pre-diabetes, manages it with diet and exercise.  Patient is a 59 y.o. female presenting with headaches, hyperglycemia, and vomiting. The history is provided by the patient.  Headache Pain location:  Frontal Quality:  Dull Onset quality:  Gradual Duration:  1 week Timing:  Intermittent Progression:  Unchanged Chronicity:  New Similar to prior headaches: no   Associated symptoms: fever (101 last night)   Associated symptoms: no abdominal pain, no cough, no nausea and no vomiting   Hyperglycemia Associated symptoms: fever (101 last night)   Associated symptoms: no abdominal pain, no nausea, no shortness of breath and no vomiting   Emesis Severity:  Moderate Timing:  Constant Quality:  Stomach contents Progression:  Worsening Chronicity:  New Relieved by:  Nothing Worsened by:  Nothing tried Associated symptoms: headaches   Associated symptoms: no abdominal pain     Past Medical History  Diagnosis Date  . Diabetes ([place])   . High blood pressure   . Kidney stones   . Migraines   . Colon polyps   . Thyroid disease   . Brain aneurysm 03/19/13   Past Surgical History  Procedure Laterality Date  . Brain surgery  03/19/13  . Inner ear surgery      Lost Hearing  . Cholecystectomy  2013  . Appendectomy    . Tonsillectomy and adenoidectomy    . Vaginal hysterectomy     Family History  Problem Relation Age of Onset  . Emphysema Maternal Aunt     never smoked, spouse did  . Asthma Mother   . Allergies Mother   . Pancreatic cancer Brother   . Lung cancer  Father     smoked  . Throat cancer Father     smoked   Social History  Substance Use Topics  . Smoking status: Former Smoker -- 0.50 packs/day for 40 years    Types: Cigarettes    Quit date: 05/26/2011  . Smokeless tobacco: Never Used  . Alcohol Use: No   OB History    No data available     Review of Systems  Constitutional: Positive for fever (101 last night).  Respiratory: Negative for cough and shortness of breath.   Gastrointestinal: Negative for nausea, vomiting and abdominal pain.  Neurological: Positive for headaches.  All other systems reviewed and are negative.     Allergies  Amoxicillin; Bee venom; Penicillins; Pneumococcal vaccines; and Sulfa antibiotics  Home Medications   Prior to Admission medications   Medication Sig Start Date End Date Taking? Authorizing Provider  amitriptyline (ELAVIL) 25 MG tablet Take 3 tablets by mouth at bedtime.    Historical Provider, MD  Cyanocobalamin (B-12) 2500 MCG TABS Take by mouth.    Historical Provider, MD  diclofenac (VOLTAREN) 75 MG EC tablet Take 75 mg by mouth 2 (two) times daily.    Historical Provider, MD  lamoTRIgine (LAMICTAL) 200 MG tablet Take 200 mg by mouth 2 (two) times daily.    Historical Provider, MD  propranolol (INNOPRAN XL) 120 MG 24 hr capsule Take 120 mg by mouth at bedtime.    Historical Provider,  MD   BP 121/54 mmHg  Pulse 89  Temp(Src) 98.7 F (37.1 C) (Oral)  Resp 17  SpO2 98% Physical Exam  Constitutional: She is oriented to person, place, and time. She appears well-developed and well-nourished. No distress.  HENT:  Head: Normocephalic and atraumatic.  Mouth/Throat: Oropharynx is clear and moist.  Eyes: EOM are normal. Pupils are equal, round, and reactive to light.  Neck: Normal range of motion. Neck supple.  Cardiovascular: Normal rate and regular rhythm.  Exam reveals no friction rub.   No murmur heard. Pulmonary/Chest: Effort normal and breath sounds normal. No respiratory distress.  She has no wheezes. She has no rales.  Abdominal: Soft. She exhibits no distension. There is no tenderness. There is no rebound.  Musculoskeletal: Normal range of motion. She exhibits no edema.  Neurological: She is alert and oriented to person, place, and time. No cranial nerve deficit. She exhibits normal muscle tone. Coordination normal.  Skin: No rash noted. She is not diaphoretic.  Nursing note and vitals reviewed.   ED Course  Procedures (including critical care time) Labs Review Labs Reviewed  CBC - Abnormal; Notable for the following:    WBC 16.9 (*)    All other components within normal limits  COMPREHENSIVE METABOLIC PANEL - Abnormal; Notable for the following:    Sodium 127 (*)    Chloride 91 (*)    Glucose, Bld 474 (*)    Albumin 3.4 (*)    All other components within normal limits  URINALYSIS, ROUTINE W REFLEX MICROSCOPIC (NOT AT [facility]) - Abnormal; Notable for the following:    APPearance CLOUDY (*)    Glucose, UA >1000 (*)    Hgb urine dipstick MODERATE (*)    Ketones, ur 40 (*)    Protein, ur 30 (*)    Leukocytes, UA MODERATE (*)    All other components within normal limits  URINE MICROSCOPIC-ADD ON - Abnormal; Notable for the following:    Squamous Epithelial / LPF FEW (*)    Bacteria, UA MANY (*)    All other components within normal limits  URINE CULTURE  I-STAT TROPOININ, ED    Imaging Review Dg Chest 2 View  03/07/2015  CLINICAL DATA:  Four-day history of nausea, vomiting and headaches. EXAM: CHEST  2 VIEW COMPARISON:  June 23, 2013 FINDINGS: The heart size and mediastinal contours are within normal limits. There is no focal infiltrate, pulmonary edema, or pleural effusion. Minimal atelectasis of bilateral lung bases are noted. The visualized skeletal structures are unremarkable. IMPRESSION: No active cardiopulmonary disease. Electronically Signed   By: Abelardo Diesel M.D.   On: 03/07/2015 13:44   Ct Head Wo Contrast  03/07/2015  CLINICAL DATA:  Recent  falls. Hyperglycemia with vomiting. Altered mental status EXAM: CT HEAD WITHOUT CONTRAST TECHNIQUE: Contiguous axial images were obtained from the base of the skull through the vertex without intravenous contrast. COMPARISON:  None. FINDINGS: The ventricles are normal in size and configuration. There is a small cavum septum pellucidum, an anatomic variant. The patient has had a previous right temporal craniotomy with a surgical clip placed in the periphery of the anterior right temporal lobe region. There is moderate artifact from the clip in this area. With the proviso that there is artifact in the anterior right temporal/posterior right frontal regions, there is no apparent hemorrhage, extra-axial fluid collection, or midline shift. There is a posterior fossa extra-axial/arachnoid cyst measuring 1.3 x 1.1 cm. No other evidence of mass. In areas that can be assessed,  the gray-white compartments appear normal. There is no acute infarct evident. The bony calvarium appears intact except for the postoperative change in the right temporal region. The mastoid air cells are clear. IMPRESSION: Postoperative change on the right with artifact from the clip in the periphery of the right posterior frontal -anterior temporal region. No demonstrable intracranial hemorrhage, subdural or epidural fluid, or focal gray - white compartment lesions/acute appearing infarct. There is an arachnoid cyst in the posterior fossa region, a benign finding. No intra-axial mass. Electronically Signed   By: Lowella Grip III M.D.   On: 03/07/2015 14:29   I have personally reviewed and evaluated these images and lab results as part of my medical decision-making.   EKG Interpretation None      MDM   Final diagnoses:  Acute cystitis without hematuria    60F here with shortness of breath, vomiting, nausea, feeling off balance. She's been feeling this way for about a week. Febrile last night, orally afebrile here. No CP, SOB. No  cough or dysuria. No abdominal pain. Exam benign. EMS blood sugar 467. Will give fluids, check labs.  Labs show elevated blood sugar, UTI. With her new hyperglycemia, I feel she needs to be observed. Medicine admitting.   Evelina Bucy, MD 03/07/15 [phone]

## 2015-03-07 NOTE — ED Notes (Signed)
Dinner tray ordered @ [id number].

## 2015-03-07 NOTE — ED Notes (Signed)
Admitting at bedside 

## 2015-03-08 ENCOUNTER — Encounter (HOSPITAL_COMMUNITY): Payer: Self-pay | Admitting: *Deleted

## 2015-03-08 DIAGNOSIS — E1165 Type 2 diabetes mellitus with hyperglycemia: Secondary | ICD-10-CM | POA: Diagnosis not present

## 2015-03-08 DIAGNOSIS — E785 Hyperlipidemia, unspecified: Secondary | ICD-10-CM | POA: Diagnosis not present

## 2015-03-08 DIAGNOSIS — E1101 Type 2 diabetes mellitus with hyperosmolarity with coma: Secondary | ICD-10-CM | POA: Diagnosis not present

## 2015-03-08 DIAGNOSIS — N3000 Acute cystitis without hematuria: Secondary | ICD-10-CM | POA: Diagnosis not present

## 2015-03-08 DIAGNOSIS — R739 Hyperglycemia, unspecified: Secondary | ICD-10-CM | POA: Diagnosis not present

## 2015-03-08 DIAGNOSIS — R0602 Shortness of breath: Secondary | ICD-10-CM

## 2015-03-08 DIAGNOSIS — W19XXXA Unspecified fall, initial encounter: Secondary | ICD-10-CM | POA: Diagnosis not present

## 2015-03-08 DIAGNOSIS — E871 Hypo-osmolality and hyponatremia: Secondary | ICD-10-CM | POA: Diagnosis not present

## 2015-03-08 DIAGNOSIS — N390 Urinary tract infection, site not specified: Secondary | ICD-10-CM | POA: Diagnosis present

## 2015-03-08 DIAGNOSIS — R112 Nausea with vomiting, unspecified: Secondary | ICD-10-CM

## 2015-03-08 DIAGNOSIS — G629 Polyneuropathy, unspecified: Secondary | ICD-10-CM

## 2015-03-08 LAB — COMPREHENSIVE METABOLIC PANEL
ALT: 21 U/L (ref 14–54)
AST: 19 U/L (ref 15–41)
Albumin: 2.6 g/dL — ABNORMAL LOW (ref 3.5–5.0)
Alkaline Phosphatase: 89 U/L (ref 38–126)
Anion gap: 13 (ref 5–15)
BUN: 11 mg/dL (ref 6–20)
CO2: 19 mmol/L — ABNORMAL LOW (ref 22–32)
Calcium: 7.9 mg/dL — ABNORMAL LOW (ref 8.9–10.3)
Chloride: 101 mmol/L (ref 101–111)
Creatinine, Ser: 0.93 mg/dL (ref 0.44–1.00)
GFR calc Af Amer: >60 mL/min (ref >60)
GFR calc non Af Amer: >60 mL/min (ref >60)
Glucose, Bld: 267 mg/dL — ABNORMAL HIGH (ref 65–99)
Potassium: 3 mmol/L — ABNORMAL LOW (ref 3.5–5.1)
Sodium: 133 mmol/L — ABNORMAL LOW (ref 135–145)
Total Bilirubin: 0.9 mg/dL (ref 0.3–1.2)
Total Protein: 5.6 g/dL — ABNORMAL LOW (ref 6.5–8.1)

## 2015-03-08 LAB — CBC
HCT: 33.8 % — ABNORMAL LOW (ref 36.0–46.0)
Hemoglobin: 11.2 g/dL — ABNORMAL LOW (ref 12.0–15.0)
MCH: 27.1 pg (ref 26.0–34.0)
MCHC: 33.1 g/dL (ref 30.0–36.0)
MCV: 81.6 fL (ref 78.0–100.0)
Platelets: 209 10*3/uL (ref 150–400)
RBC: 4.14 MIL/uL (ref 3.87–5.11)
RDW: 12.9 % (ref 11.5–15.5)
WBC: 8.3 10*3/uL (ref 4.0–10.5)

## 2015-03-08 LAB — TSH: TSH: 2.669 u[IU]/mL (ref 0.350–4.500)

## 2015-03-08 LAB — HEMOGLOBIN A1C
Hgb A1c MFr Bld: 13.9 % — ABNORMAL HIGH (ref 4.8–5.6)
Mean Plasma Glucose: 352 mg/dL

## 2015-03-08 LAB — GLUCOSE, CAPILLARY
Glucose-Capillary: 189 mg/dL — ABNORMAL HIGH (ref 65–99)
Glucose-Capillary: 189 mg/dL — ABNORMAL HIGH (ref 65–99)
Glucose-Capillary: 192 mg/dL — ABNORMAL HIGH (ref 65–99)
Glucose-Capillary: 205 mg/dL — ABNORMAL HIGH (ref 65–99)
Glucose-Capillary: 215 mg/dL — ABNORMAL HIGH (ref 65–99)
Glucose-Capillary: 257 mg/dL — ABNORMAL HIGH (ref 65–99)

## 2015-03-08 LAB — MAGNESIUM: Magnesium: 1.7 mg/dL (ref 1.7–2.4)

## 2015-03-08 MED ORDER — INSULIN GLARGINE 100 UNIT/ML ~~LOC~~ SOLN
12.0000 [IU] | Freq: Every day | SUBCUTANEOUS | Status: DC
  Administered 2015-03-08 – 2015-03-09 (×2): 12 [IU] via SUBCUTANEOUS
  Filled 2015-03-08 (×3): qty 0.12

## 2015-03-08 MED ORDER — POTASSIUM CHLORIDE CRYS ER 20 MEQ PO TBCR
60.0000 meq | EXTENDED_RELEASE_TABLET | Freq: Four times a day (QID) | ORAL | Status: AC
  Administered 2015-03-08 (×2): 60 meq via ORAL
  Filled 2015-03-08 (×2): qty 3

## 2015-03-08 MED ORDER — INSULIN STARTER KIT- SYRINGES (ENGLISH)
1.0000 | Freq: Once | Status: AC
  Administered 2015-03-08: 1
  Filled 2015-03-08: qty 1

## 2015-03-08 MED ORDER — POTASSIUM CHLORIDE 10 MEQ/100ML IV SOLN
10.0000 meq | INTRAVENOUS | Status: AC
  Administered 2015-03-08 (×2): 10 meq via INTRAVENOUS
  Filled 2015-03-08 (×2): qty 100

## 2015-03-08 MED ORDER — LIVING WELL WITH DIABETES BOOK
Freq: Once | Status: AC
  Administered 2015-03-08: 08:00:00
  Filled 2015-03-08: qty 1

## 2015-03-08 MED ORDER — SODIUM CHLORIDE 0.9 % IV SOLN
INTRAVENOUS | Status: DC
  Administered 2015-03-08 – 2015-03-09 (×2): via INTRAVENOUS
  Administered 2015-03-09: 100 mL/h via INTRAVENOUS
  Administered 2015-03-10: 05:00:00 via INTRAVENOUS

## 2015-03-08 NOTE — Progress Notes (Signed)
Pt. Educated about insulin injection. All questions answered.

## 2015-03-08 NOTE — Evaluation (Signed)
Physical Therapy Evaluation Patient Details Name: Laura Gentry MRN: [id number] DOB: [birth year] Today's Date: 03/08/2015   History of Present Illness  Pt adm with hyperglycemia and newly diagnosed DM. PMH - aneurysm clipping,  Clinical Impression  Pt admitted with above diagnosis and presents to PT with functional limitations due to deficits listed below (See PT problem list). Pt needs skilled PT to maximize independence and safety to allow discharge to home. Family can assist pt with amb in room and hallways.     Follow Up Recommendations Outpatient PT (can go through PCP)    Equipment Recommendations  None recommended by PT    Recommendations for Other Services       Precautions / Restrictions Precautions Precautions: None      Mobility  Bed Mobility Overal bed mobility: Independent                Transfers Overall transfer level: Modified independent Equipment used: None                Ambulation/Gait Ambulation/Gait assistance: Supervision;Min guard Ambulation Distance (Feet): 300 Feet Assistive device: None;4-wheeled walker Gait Pattern/deviations: Step-through pattern;Decreased stride length Gait velocity: decr Gait velocity interpretation: Below normal speed for age/gender General Gait Details: Pt with guarded gait and slightly unsteady but no loss of balance.  Stairs            Wheelchair Mobility    Modified Rankin (Stroke Patients Only)       Balance Overall balance assessment: History of Falls;Needs assistance Sitting-balance support: No upper extremity supported;Feet supported Sitting balance-Leahy Scale: Good     Standing balance support: No upper extremity supported Standing balance-Leahy Scale: Good                               Pertinent Vitals/Pain Pain Assessment: Faces Faces Pain Scale: Hurts little more Pain Location: rt groin Pain Descriptors / Indicators: Grimacing;Guarding Pain Intervention(s):  Limited activity within patient's tolerance;Monitored during session;Repositioned    Home Living Family/patient expects to be discharged to:: Private residence Living Arrangements: Alone Available Help at Discharge: Family;Available PRN/intermittently Type of Home: House       Home Layout: Multi-level Home Equipment: None      Prior Function Level of Independence: Independent         Comments: Works at [employer]. Incr in falls likely due to high blood sugar. Some falls prior on stairs at work. Pt reports needing to look at feet while negotiating stairs.     Hand Dominance        Extremity/Trunk Assessment   Upper Extremity Assessment: Overall WFL for tasks assessed           Lower Extremity Assessment: RLE deficits/detail;LLE deficits/detail         Communication   Communication: No difficulties  Cognition Arousal/Alertness: Awake/alert Behavior During Therapy: WFL for tasks assessed/performed Overall Cognitive Status: Within Functional Limits for tasks assessed                      General Comments      Exercises        Assessment/Plan    PT Assessment Patient needs continued PT services  PT Diagnosis Difficulty walking   PT Problem List Decreased balance;Decreased mobility  PT Treatment Interventions Gait training;Functional mobility training;Balance training;Patient/family education   PT Goals (Current goals can be found in the Care Plan section) Acute Rehab PT Goals Patient Stated  Goal: return home PT Goal Formulation: With patient Time For Goal Achievement: 03/15/15 Potential to Achieve Goals: Good    Frequency Min 3X/week   Barriers to discharge        Co-evaluation               End of Session   Activity Tolerance: Patient tolerated treatment well Patient left: in bed;with call bell/phone within reach;with family/visitor present Nurse Communication: Mobility status    Functional Assessment Tool Used: clinical  judgement Functional Limitation: Mobility: Walking and moving around Mobility: Walking and Moving Around Current Status (B3532): At least 1 percent but less than 20 percent impaired, limited or restricted Mobility: Walking and Moving Around Goal Status [phone]): 0 percent impaired, limited or restricted    Time: 1004-1021 PT Time Calculation (min) (ACUTE ONLY): 17 min   Charges:   PT Evaluation $Initial PT Evaluation Tier I: 1 Procedure     PT G Codes:   PT G-Codes **NOT FOR INPATIENT CLASS** Functional Assessment Tool Used: clinical judgement Functional Limitation: Mobility: Walking and moving around Mobility: Walking and Moving Around Current Status (A8341): At least 1 percent but less than 20 percent impaired, limited or restricted Mobility: Walking and Moving Around Goal Status [phone]): 0 percent impaired, limited or restricted    [facility] 03/08/2015, 11:20 AM  Laura Gentry

## 2015-03-08 NOTE — Discharge Instructions (Signed)
Your A1c was 13.9%. Goal A1c for patients that are diagnosed with Diabetes is 7% or less. See attached discharge instructions for further education. You have been referred to Outpatient Education at our [facility]. They will call you for an appointment. Make sure you are taking your medications for diabetes and checking your sugar levels with your new meter.

## 2015-03-08 NOTE — Progress Notes (Signed)
Inpatient Diabetes Program Recommendations  AACE/ADA: New Consensus Statement on Inpatient Glycemic Control (2015)  Target Ranges:  Prepandial:   less than 140 mg/dL      Peak postprandial:   less than 180 mg/dL (1-2 hours)      Critically ill patients:  140 - 180 mg/dL    Went to see patient and discuss new diagnosis of DM and A1c results and basic home care. After speaking with patient a few minutes. She could not repeat back to me anything I spoke to her about. Friend in room states that ever since patient had a brain aneurysm she has needed help with certain things and retaining information. Friend in room states patient lives with mother and sister Shirlean Mylar and information really needs to be taught to both patient and the sister Shirlean Mylar). I will attach DM information to her discharge paperwork with instructions. Patient consented to outpatient education which I will order and request that patient be seen with her sister present. Living well with diabetes book at bedside. Friend will tell patients sister Shirlean Mylar) to review contents. RN all education needs to be done with sister Shirlean Mylar in the room.  Thanks,  Tama Headings RN, MSN, [facility] Inpatient Diabetes Coordinator Team Pager [phone] (8a-5p)

## 2015-03-08 NOTE — Progress Notes (Addendum)
Inpatient Diabetes Program Recommendations  AACE/ADA: New Consensus Statement on Inpatient Glycemic Control (2015)  Target Ranges:  Prepandial:   less than 140 mg/dL      Peak postprandial:   less than 180 mg/dL (1-2 hours)      Critically ill patients:  140 - 180 mg/dL   Review of Glycemic Control  Diabetes history: PreDM Outpatient Diabetes medications: None Current orders for Inpatient glycemic control: Novolog Moderate Q4hrs  Inpatient Diabetes Program Recommendations: Insulin - Basal: Glucose labs significantly elevated > 400 on admission. Fasting glucose is still 267 mg/dl this am. Please consider starting patient on low dose basal insulin while inpatient Lantus 14 units Q24hrs (Lantus Vial NOT pen covered by insurance). Patient will most likely also need once daily basal on discharge.  Note: Waiting on A1c results. Expecting it to be high. DM education needs to be started.  RN to verify and teach patient survival skills before discharge: s/s hypoglycemia and treatment s/s hyperglycemia and treatment A1c results, goal is 7% or less (150 avg glucose) New medications they will be on Check glucose and how often (1-2 times for oral meds, 3-4 times a day with insulin) Teach how to draw up and administer insulin with each insulin administration dose scheduled Watching carb intake (45-60 g/females/meal 15 g/snack, 60-75 g/males 15-30 g/snack), plate method, watching beverage options.  Will place order for Living Well with Diabetes booklet in addition to the educational videos.  Thanks,  Tama Headings RN, MSN, [facility] Inpatient Diabetes Coordinator Team Pager [phone] (8a-5p)

## 2015-03-08 NOTE — Progress Notes (Signed)
[facility] PROGRESS NOTE   Laura Gentry KNL:[id number] DOB: 16-Nov-1955 DOA: 03/07/2015 PCP: Garnet Koyanagi, DO  HPI/Subjective: Feels much better, denies any new complaints.  Assessment/Plan: Principal Problem:   Hyperglycemia Active Problems:   SOB (shortness of breath) on exertion   Nausea with vomiting   HLD (hyperlipidemia)   Hyponatremia   UTI (urinary tract infection)   Falls   Neuropathy (HCC)    Diabetes mellitus type 2, uncontrolled, new diagnosis Patient has history of prediabetes is, came in to the hospital complaining about polydipsia, generalized weakness. Blood glucose was 474 in the emergency department without evidence of acidosis. Hemoglobin A1c is 13.9 which correlate with mean plasma glucose of 352. Patient started on insulin, she will need insulin on discharge, patient to learn how to inject herself.  UTI Urinalysis consistent with UTI, patient started Rocephin in the knee. We'll adjust antibiotics according to the urine culture results.  Hyponatremia Sodium of 127, this is pseudohyponatremia secondary to the hyperglycemia. Corrected sodium level of 136 for blood glucose of 474.  Peripheral neuropathy This is likely secondary to the diabetes mellitus type 2 and before that impaired fasting glucose.  Falls Frequent falls for the past 3 weeks, denies any head trauma. The reason of the fall is likely secondary to generalized weakness from the hyperglycemia and dehydration. CT scan of the head showed no evidence of acute events. No focal symptoms.   Code Status: Full Code Family Communication: Plan discussed with the patient. Disposition Plan: Remains inpatient Diet: Diet Carb Modified Fluid consistency:: Thin; Room service appropriate?: Yes  Consultants:  None  Procedures:  None  Antibiotics:  None   Objective: Filed Vitals:   03/08/15 1157  BP: 151/72  Pulse: 98  Temp:   Resp:     Intake/Output Summary (Last 24  hours) at 03/08/15 1308 Last data filed at 03/08/15 0916  Gross per 24 hour  Intake   2120 ml  Output      0 ml  Net   2120 ml   Filed Weights   03/07/15 2107  Weight: 70.171 kg (154 lb 11.2 oz)    Exam: General: Alert and awake, oriented x3, not in any acute distress. HEENT: anicteric sclera, pupils reactive to light and accommodation, EOMI CVS: S1-S2 clear, no murmur rubs or gallops Chest: clear to auscultation bilaterally, no wheezing, rales or rhonchi Abdomen: soft nontender, nondistended, normal bowel sounds, no organomegaly Extremities: no cyanosis, clubbing or edema noted bilaterally Neuro: Cranial nerves II-XII intact, no focal neurological deficits  Data Reviewed: Basic Metabolic Panel:  Recent Labs Lab 03/07/15 1305 03/08/15 0453 03/08/15 0800  NA 127* 133*  --   K 3.6 3.0*  --   CL 91* 101  --   CO2 23 19*  --   GLUCOSE 474* 267*  --   BUN 9 11  --   CREATININE 0.99 0.93  --   CALCIUM 9.0 7.9*  --   MG  --   --  1.7   Liver Function Tests:  Recent Labs Lab 03/07/15 1305 03/08/15 0453  AST 37 19  ALT 30 21  ALKPHOS 120 89  BILITOT 0.9 0.9  PROT 6.9 5.6*  ALBUMIN 3.4* 2.6*   No results for input(s): LIPASE, AMYLASE in the last 168 hours. No results for input(s): AMMONIA in the last 168 hours. CBC:  Recent Labs Lab 03/07/15 1305 03/08/15 0453  WBC 16.9* 8.3  HGB 12.9 11.2*  HCT 37.6 33.8*  MCV 80.7 81.6  PLT 226  209   Cardiac Enzymes: No results for input(s): CKTOTAL, CKMB, CKMBINDEX, TROPONINI in the last 168 hours. BNP (last 3 results) No results for input(s): BNP in the last 8760 hours.  ProBNP (last 3 results) No results for input(s): PROBNP in the last 8760 hours.  CBG:  Recent Labs Lab 03/07/15 2005 03/08/15 0010 03/08/15 0356 03/08/15 0748 03/08/15 1149  GLUCAP 193* 205* 257* 189* 215*    Micro Recent Results (from the past 240 hour(s))  Urine culture     Status: None (Preliminary result)   Collection Time:  03/07/15  1:54 PM  Result Value Ref Range Status   Specimen Description URINE, RANDOM  Final   Special Requests NONE  Final   Culture   Final    >=100,000 COLONIES/mL GRAM NEGATIVE RODS CULTURE REINCUBATED FOR BETTER GROWTH    Report Status PENDING  Incomplete     Studies: Dg Chest 2 View  03/07/2015  CLINICAL DATA:  Four-day history of nausea, vomiting and headaches. EXAM: CHEST  2 VIEW COMPARISON:  June 23, 2013 FINDINGS: The heart size and mediastinal contours are within normal limits. There is no focal infiltrate, pulmonary edema, or pleural effusion. Minimal atelectasis of bilateral lung bases are noted. The visualized skeletal structures are unremarkable. IMPRESSION: No active cardiopulmonary disease. Electronically Signed   By: Abelardo Diesel M.D.   On: 03/07/2015 13:44   Ct Head Wo Contrast  03/07/2015  CLINICAL DATA:  Recent falls. Hyperglycemia with vomiting. Altered mental status EXAM: CT HEAD WITHOUT CONTRAST TECHNIQUE: Contiguous axial images were obtained from the base of the skull through the vertex without intravenous contrast. COMPARISON:  None. FINDINGS: The ventricles are normal in size and configuration. There is a small cavum septum pellucidum, an anatomic variant. The patient has had a previous right temporal craniotomy with a surgical clip placed in the periphery of the anterior right temporal lobe region. There is moderate artifact from the clip in this area. With the proviso that there is artifact in the anterior right temporal/posterior right frontal regions, there is no apparent hemorrhage, extra-axial fluid collection, or midline shift. There is a posterior fossa extra-axial/arachnoid cyst measuring 1.3 x 1.1 cm. No other evidence of mass. In areas that can be assessed, the gray-white compartments appear normal. There is no acute infarct evident. The bony calvarium appears intact except for the postoperative change in the right temporal region. The mastoid air cells  are clear. IMPRESSION: Postoperative change on the right with artifact from the clip in the periphery of the right posterior frontal -anterior temporal region. No demonstrable intracranial hemorrhage, subdural or epidural fluid, or focal gray - white compartment lesions/acute appearing infarct. There is an arachnoid cyst in the posterior fossa region, a benign finding. No intra-axial mass. Electronically Signed   By: Lowella Grip III M.D.   On: 03/07/2015 14:29    Scheduled Meds: . amitriptyline  50 mg Oral QHS  . cefTRIAXone (ROCEPHIN)  IV  1 g Intravenous Q24H  . cyanocobalamin  2,500 mcg Oral Q1200  . diclofenac  75 mg Oral BID  . enoxaparin (LOVENOX) injection  40 mg Subcutaneous Q24H  . insulin aspart  0-15 Units Subcutaneous 6 times per day  . insulin starter kit- syringes  1 kit Other Once  . lamoTRIgine  200 mg Oral BID  . propranolol ER  120 mg Oral Q1200   Continuous Infusions: . sodium chloride 100 mL/hr at 03/08/15 0630       Time spent: 35 minutes  [facility]  [facility] Pager [phone] If 7PM-7AM, please contact night-coverage at [email or web address], password [email or web address] 03/08/2015, 1:08 PM

## 2015-03-09 ENCOUNTER — Encounter (HOSPITAL_COMMUNITY): Payer: Self-pay | Admitting: Radiology

## 2015-03-09 ENCOUNTER — Inpatient Hospital Stay (HOSPITAL_COMMUNITY): Payer: Managed Care, Other (non HMO)

## 2015-03-09 DIAGNOSIS — E1100 Type 2 diabetes mellitus with hyperosmolarity without nonketotic hyperglycemic-hyperosmolar coma (NKHHC): Secondary | ICD-10-CM | POA: Diagnosis present

## 2015-03-09 DIAGNOSIS — G43909 Migraine, unspecified, not intractable, without status migrainosus: Secondary | ICD-10-CM | POA: Diagnosis present

## 2015-03-09 DIAGNOSIS — R739 Hyperglycemia, unspecified: Secondary | ICD-10-CM | POA: Diagnosis not present

## 2015-03-09 DIAGNOSIS — E1142 Type 2 diabetes mellitus with diabetic polyneuropathy: Secondary | ICD-10-CM | POA: Diagnosis present

## 2015-03-09 DIAGNOSIS — W19XXXA Unspecified fall, initial encounter: Secondary | ICD-10-CM | POA: Diagnosis not present

## 2015-03-09 DIAGNOSIS — E876 Hypokalemia: Secondary | ICD-10-CM | POA: Diagnosis present

## 2015-03-09 DIAGNOSIS — E1101 Type 2 diabetes mellitus with hyperosmolarity with coma: Secondary | ICD-10-CM | POA: Diagnosis present

## 2015-03-09 DIAGNOSIS — N3000 Acute cystitis without hematuria: Secondary | ICD-10-CM | POA: Diagnosis present

## 2015-03-09 DIAGNOSIS — Z87891 Personal history of nicotine dependence: Secondary | ICD-10-CM | POA: Diagnosis not present

## 2015-03-09 DIAGNOSIS — E785 Hyperlipidemia, unspecified: Secondary | ICD-10-CM | POA: Diagnosis present

## 2015-03-09 DIAGNOSIS — R296 Repeated falls: Secondary | ICD-10-CM | POA: Diagnosis present

## 2015-03-09 DIAGNOSIS — E871 Hypo-osmolality and hyponatremia: Secondary | ICD-10-CM | POA: Diagnosis present

## 2015-03-09 DIAGNOSIS — E1165 Type 2 diabetes mellitus with hyperglycemia: Secondary | ICD-10-CM | POA: Diagnosis present

## 2015-03-09 DIAGNOSIS — E870 Hyperosmolality and hypernatremia: Secondary | ICD-10-CM | POA: Diagnosis present

## 2015-03-09 DIAGNOSIS — N390 Urinary tract infection, site not specified: Secondary | ICD-10-CM

## 2015-03-09 DIAGNOSIS — E860 Dehydration: Secondary | ICD-10-CM | POA: Diagnosis present

## 2015-03-09 LAB — BASIC METABOLIC PANEL
Anion gap: 5 (ref 5–15)
BUN: <5 mg/dL — ABNORMAL LOW (ref 6–20)
CO2: 17 mmol/L — ABNORMAL LOW (ref 22–32)
Calcium: 6.7 mg/dL — ABNORMAL LOW (ref 8.9–10.3)
Chloride: 114 mmol/L — ABNORMAL HIGH (ref 101–111)
Creatinine, Ser: 0.63 mg/dL (ref 0.44–1.00)
GFR calc Af Amer: >60 mL/min (ref >60)
GFR calc non Af Amer: >60 mL/min (ref >60)
Glucose, Bld: 214 mg/dL — ABNORMAL HIGH (ref 65–99)
Potassium: 2.8 mmol/L — ABNORMAL LOW (ref 3.5–5.1)
Sodium: 136 mmol/L (ref 135–145)

## 2015-03-09 LAB — GLUCOSE, CAPILLARY
Glucose-Capillary: 151 mg/dL — ABNORMAL HIGH (ref 65–99)
Glucose-Capillary: 157 mg/dL — ABNORMAL HIGH (ref 65–99)
Glucose-Capillary: 166 mg/dL — ABNORMAL HIGH (ref 65–99)
Glucose-Capillary: 182 mg/dL — ABNORMAL HIGH (ref 65–99)
Glucose-Capillary: 207 mg/dL — ABNORMAL HIGH (ref 65–99)
Glucose-Capillary: 213 mg/dL — ABNORMAL HIGH (ref 65–99)
Glucose-Capillary: 217 mg/dL — ABNORMAL HIGH (ref 65–99)

## 2015-03-09 MED ORDER — POTASSIUM CHLORIDE CRYS ER 20 MEQ PO TBCR
60.0000 meq | EXTENDED_RELEASE_TABLET | Freq: Four times a day (QID) | ORAL | Status: AC
  Administered 2015-03-09 (×3): 60 meq via ORAL
  Filled 2015-03-09 (×3): qty 3

## 2015-03-09 MED ORDER — MAGNESIUM SULFATE 2 GM/50ML IV SOLN
2.0000 g | Freq: Four times a day (QID) | INTRAVENOUS | Status: AC
  Administered 2015-03-09 (×2): 2 g via INTRAVENOUS
  Filled 2015-03-09 (×2): qty 50

## 2015-03-09 MED ORDER — HYDROCODONE-ACETAMINOPHEN 5-325 MG PO TABS
1.0000 | ORAL_TABLET | Freq: Four times a day (QID) | ORAL | Status: DC | PRN
  Administered 2015-03-09 (×2): 1 via ORAL
  Filled 2015-03-09 (×2): qty 1

## 2015-03-09 NOTE — Progress Notes (Addendum)
[facility] PROGRESS NOTE   Laura Gentry SWN:[id number] DOB: [birth year] DOA: 03/07/2015 PCP: Garnet Koyanagi, DO  HPI/Subjective: Seen with her sister at bedside, feels much better. Still not comfortable with insulin injection, potassium is severely low at 2.8. Fasting glucose still 157.  Assessment/Plan: Principal Problem:   Hyperglycemia Active Problems:   SOB (shortness of breath) on exertion   Nausea with vomiting   HLD (hyperlipidemia)   Hyponatremia   UTI (urinary tract infection)   Falls   Neuropathy (HCC)   UTI (lower urinary tract infection)    Diabetes mellitus type 2, uncontrolled, new diagnosis Patient has history of prediabetes is, came in to the hospital complaining about polydipsia, generalized weakness. Blood glucose was 474 in the emergency department without evidence of acidosis. Hemoglobin A1c is 13.9 which correlate with mean plasma glucose of 352. Patient started on insulin, she will need insulin on discharge, patient to learn how to inject herself. Started on 12 units of Lantus last night, fasting blood glucose 157. Will add meal coverage.  Hyperosmolar hyperglycemic state Patient presented to the hospital with blood glucose of 474, patient also has neurological manifestation. Neurological manifestations including generalized weakness, frequent falls and slurred speech. Osmolality not measured on admission, but patient probably was hyperosmolar. Patient treated aggressively with IV fluids, needed only subcutaneous insulin to bring the blood sugar down as she is insulin nave. This appears to be resolved.  UTI Urinalysis consistent with UTI, patient started Rocephin in the ED, culture showing GNR so far. We'll adjust antibiotics according to the urine culture results.  Hyponatremia Sodium of 127, this is pseudohyponatremia secondary to the hyperglycemia. Corrected sodium level of 136 for blood glucose of 474.  Peripheral neuropathy This is  likely secondary to the diabetes mellitus type 2 and before that impaired fasting glucose.  Falls Frequent falls for the past 3 weeks, denies any head trauma. The reason of the fall is likely secondary to generalized weakness from the hyperglycemia and dehydration. CT scan of the head showed no evidence of acute events. No focal symptoms.  Hypokalemia Potassium was 3.0 yesterday, given potassium supplementation, still 2.8 today. Will aggressively repleted with oral supplements.   Code Status: Full Code Family Communication: Plan discussed with the patient. Disposition Plan: Remains inpatient Diet: Diet Carb Modified Fluid consistency:: Thin; Room service appropriate?: Yes  Consultants:  None  Procedures:  None  Antibiotics:  None   Objective: Filed Vitals:   03/09/15 0617  BP: 106/65  Pulse: 101  Temp:   Resp:     Intake/Output Summary (Last 24 hours) at 03/09/15 1101 Last data filed at 03/09/15 0700  Gross per 24 hour  Intake   2000 ml  Output      2 ml  Net   1998 ml   Filed Weights   03/07/15 2107  Weight: 70.171 kg (154 lb 11.2 oz)    Exam: General: Alert and awake, oriented x3, not in any acute distress. HEENT: anicteric sclera, pupils reactive to light and accommodation, EOMI CVS: S1-S2 clear, no murmur rubs or gallops Chest: clear to auscultation bilaterally, no wheezing, rales or rhonchi Abdomen: soft nontender, nondistended, normal bowel sounds, no organomegaly Extremities: no cyanosis, clubbing or edema noted bilaterally Neuro: Cranial nerves II-XII intact, no focal neurological deficits  Data Reviewed: Basic Metabolic Panel:  Recent Labs Lab 03/07/15 1305 03/08/15 0453 03/08/15 0800 03/09/15 0951  NA 127* 133*  --  136  K 3.6 3.0*  --  2.8*  CL 91* 101  --  114*  CO2 23 19*  --  17*  GLUCOSE 474* 267*  --  214*  BUN 9 11  --  <5*  CREATININE 0.99 0.93  --  0.63  CALCIUM 9.0 7.9*  --  6.7*  MG  --   --  1.7  --    Liver Function  Tests:  Recent Labs Lab 03/07/15 1305 03/08/15 0453  AST 37 19  ALT 30 21  ALKPHOS 120 89  BILITOT 0.9 0.9  PROT 6.9 5.6*  ALBUMIN 3.4* 2.6*   No results for input(s): LIPASE, AMYLASE in the last 168 hours. No results for input(s): AMMONIA in the last 168 hours. CBC:  Recent Labs Lab 03/07/15 1305 03/08/15 0453  WBC 16.9* 8.3  HGB 12.9 11.2*  HCT 37.6 33.8*  MCV 80.7 81.6  PLT 226 209   Cardiac Enzymes: No results for input(s): CKTOTAL, CKMB, CKMBINDEX, TROPONINI in the last 168 hours. BNP (last 3 results) No results for input(s): BNP in the last 8760 hours.  ProBNP (last 3 results) No results for input(s): PROBNP in the last 8760 hours.  CBG:  Recent Labs Lab 03/08/15 1634 03/08/15 2016 03/09/15 0021 03/09/15 0408 03/09/15 0759  GLUCAP 189* 192* 213* 166* 157*    Micro Recent Results (from the past 240 hour(s))  Urine culture     Status: None (Preliminary result)   Collection Time: 03/07/15  1:54 PM  Result Value Ref Range Status   Specimen Description URINE, RANDOM  Final   Special Requests NONE  Final   Culture   Final    >=100,000 COLONIES/mL GRAM NEGATIVE RODS SUSCEPTIBILITIES TO FOLLOW    Report Status PENDING  Incomplete     Studies: Dg Chest 2 View  03/07/2015  CLINICAL DATA:  Four-day history of nausea, vomiting and headaches. EXAM: CHEST  2 VIEW COMPARISON:  June 23, 2013 FINDINGS: The heart size and mediastinal contours are within normal limits. There is no focal infiltrate, pulmonary edema, or pleural effusion. Minimal atelectasis of bilateral lung bases are noted. The visualized skeletal structures are unremarkable. IMPRESSION: No active cardiopulmonary disease. Electronically Signed   By: Abelardo Diesel M.D.   On: 03/07/2015 13:44   Ct Head Wo Contrast  03/07/2015  CLINICAL DATA:  Recent falls. Hyperglycemia with vomiting. Altered mental status EXAM: CT HEAD WITHOUT CONTRAST TECHNIQUE: Contiguous axial images were obtained from the  base of the skull through the vertex without intravenous contrast. COMPARISON:  None. FINDINGS: The ventricles are normal in size and configuration. There is a small cavum septum pellucidum, an anatomic variant. The patient has had a previous right temporal craniotomy with a surgical clip placed in the periphery of the anterior right temporal lobe region. There is moderate artifact from the clip in this area. With the proviso that there is artifact in the anterior right temporal/posterior right frontal regions, there is no apparent hemorrhage, extra-axial fluid collection, or midline shift. There is a posterior fossa extra-axial/arachnoid cyst measuring 1.3 x 1.1 cm. No other evidence of mass. In areas that can be assessed, the gray-white compartments appear normal. There is no acute infarct evident. The bony calvarium appears intact except for the postoperative change in the right temporal region. The mastoid air cells are clear. IMPRESSION: Postoperative change on the right with artifact from the clip in the periphery of the right posterior frontal -anterior temporal region. No demonstrable intracranial hemorrhage, subdural or epidural fluid, or focal gray - white compartment lesions/acute appearing infarct. There is an arachnoid cyst in  the posterior fossa region, a benign finding. No intra-axial mass. Electronically Signed   By: Lowella Grip III M.D.   On: 03/07/2015 14:29    Scheduled Meds: . amitriptyline  50 mg Oral QHS  . cefTRIAXone (ROCEPHIN)  IV  1 g Intravenous Q24H  . cyanocobalamin  2,500 mcg Oral Q1200  . enoxaparin (LOVENOX) injection  40 mg Subcutaneous Q24H  . insulin aspart  0-15 Units Subcutaneous 6 times per day  . insulin glargine  12 Units Subcutaneous QHS  . lamoTRIgine  200 mg Oral BID  . propranolol ER  120 mg Oral Q1200   Continuous Infusions: . sodium chloride 100 mL/hr (03/09/15 0156)       Time spent: 35 minutes    [facility] A  [facility] Pager  [phone] If 7PM-7AM, please contact night-coverage at [email or web address], password [facility] 03/09/2015, 11:01 AM  LOS: 1 day

## 2015-03-10 DIAGNOSIS — E1165 Type 2 diabetes mellitus with hyperglycemia: Secondary | ICD-10-CM

## 2015-03-10 DIAGNOSIS — E1101 Type 2 diabetes mellitus with hyperosmolarity with coma: Secondary | ICD-10-CM

## 2015-03-10 LAB — BASIC METABOLIC PANEL
Anion gap: 11 (ref 5–15)
BUN: <5 mg/dL — ABNORMAL LOW (ref 6–20)
CO2: 21 mmol/L — ABNORMAL LOW (ref 22–32)
Calcium: 8.3 mg/dL — ABNORMAL LOW (ref 8.9–10.3)
Chloride: 102 mmol/L (ref 101–111)
Creatinine, Ser: 0.75 mg/dL (ref 0.44–1.00)
GFR calc Af Amer: >60 mL/min (ref >60)
GFR calc non Af Amer: >60 mL/min (ref >60)
Glucose, Bld: 170 mg/dL — ABNORMAL HIGH (ref 65–99)
Potassium: 4 mmol/L (ref 3.5–5.1)
Sodium: 134 mmol/L — ABNORMAL LOW (ref 135–145)

## 2015-03-10 LAB — GLUCOSE, CAPILLARY
Glucose-Capillary: 140 mg/dL — ABNORMAL HIGH (ref 65–99)
Glucose-Capillary: 130 mg/dL — ABNORMAL HIGH (ref 65–99)
Glucose-Capillary: 196 mg/dL — ABNORMAL HIGH (ref 65–99)

## 2015-03-10 LAB — MAGNESIUM: Magnesium: 2.1 mg/dL (ref 1.7–2.4)

## 2015-03-10 LAB — URINE CULTURE: Culture: >=100000

## 2015-03-10 MED ORDER — CEFUROXIME AXETIL 500 MG PO TABS: 500.0000 mg | ORAL_TABLET | Freq: Two times a day (BID) | ORAL | Status: DC

## 2015-03-10 MED ORDER — BLOOD GLUCOSE MONITOR KIT: PACK | Status: DC

## 2015-03-10 MED ORDER — METFORMIN HCL 500 MG PO TABS: 500.0000 mg | ORAL_TABLET | Freq: Two times a day (BID) | ORAL | Status: DC

## 2015-03-10 MED ORDER — INSULIN GLARGINE 100 UNIT/ML ~~LOC~~ SOLN: 12.0000 [IU] | Freq: Every day | SUBCUTANEOUS | Status: DC

## 2015-03-10 NOTE — Progress Notes (Signed)
Nsg Discharge Note  Admit Date:  03/07/2015 Discharge date: 03/10/2015   Laura Gentry to be D/C'd Home per MD order.  AVS completed.  Copy for chart, and copy for patient signed, and dated. Patient/caregiver able to verbalize understanding.  Discharge Medication:   Medication List    STOP taking these medications        diclofenac 75 MG EC tablet  Commonly known as:  VOLTAREN      TAKE these medications        acetaminophen 500 MG tablet  Commonly known as:  TYLENOL  Take 500 mg by mouth every 6 (six) hours as needed for mild pain or moderate pain.     amitriptyline 25 MG tablet  Commonly known as:  ELAVIL  Take 50 mg by mouth at bedtime.     B-12 2500 MCG Tabs  Take 2,500 mg by mouth daily at 12 noon.     blood glucose meter kit and supplies Kit  Dispense based on patient and insurance preference. Use up to four times daily as directed. (FOR ICD-10 E11.65).     cefUROXime 500 MG tablet  Commonly known as:  CEFTIN  Take 1 tablet (500 mg total) by mouth 2 (two) times daily with a meal.     insulin glargine 100 UNIT/ML injection  Commonly known as:  LANTUS  Inject 0.12 mLs (12 Units total) into the skin at bedtime.     lamoTRIgine 200 MG tablet  Commonly known as:  LAMICTAL  Take 200 mg by mouth 2 (two) times daily.     metFORMIN 500 MG tablet  Commonly known as:  GLUCOPHAGE  Take 1 tablet (500 mg total) by mouth 2 (two) times daily with a meal.     propranolol 120 MG 24 hr capsule  Commonly known as:  INNOPRAN XL  Take 120 mg by mouth daily at 12 noon.        Discharge Assessment: Filed Vitals:   03/10/15 0639  BP: 115/60  Pulse: 89  Temp:   Resp:    Skin clean, dry and intact without evidence of skin break down, no evidence of skin tears noted. IV catheter discontinued intact. Site without signs and symptoms of complications - no redness or edema noted at insertion site, patient denies c/o pain - only slight tenderness at site.  Dressing with slight  pressure applied.  D/c Instructions-Education: Discharge instructions given to patient/family with verbalized understanding. D/c education completed with patient/family including follow up instructions, medication list, d/c activities limitations if indicated, with other d/c instructions as indicated by MD - patient able to verbalize understanding, all questions fully answered. Patient instructed to return to ED, call 911, or call MD for any changes in condition.  Patient escorted via [place], and D/C home via private auto.  Dayle Points, RN 03/10/2015 1:15 PM

## 2015-03-10 NOTE — Discharge Summary (Signed)
Physician Discharge Summary  Laura Gentry JGO:[id number] DOB: Oct 10, 1955 DOA: 03/07/2015  PCP: Garnet Koyanagi, DO  Admit date: 03/07/2015 Discharge date: 03/10/2015  Time spent: 40 minutes  Recommendations for Outpatient Follow-up:  1. Follow-up with primary care physician within one week. 2. Patient started on Lantus 12 units at night and metformin 500 mg twice a day. 3. Instructed take her blood sugar log to PCP for further adjustment of the diabetes regimen  Discharge Diagnoses:  Principal Problem:   Diabetes mellitus type 2, uncontrolled, without complications ([place]) Active Problems:   SOB (shortness of breath) on exertion   Nausea with vomiting   HLD (hyperlipidemia)   Hyponatremia   UTI (urinary tract infection)   Falls   Neuropathy (HCC)   UTI (lower urinary tract infection)   Hyperglycemic hyperosmolar nonketotic coma ([place])   Discharge Condition: Stable  Diet recommendation: Carbohydrate modified diet  Filed Weights   03/07/15 2107  Weight: 70.171 kg (154 lb 11.2 oz)    History of present illness:  Patient is a 59 year old Caucasian female with a past medical history of being the diabetic, history of kidney stones, chronic headaches, and brain aneurysm status post clipping; who presented from her primary care's office after complaining of a three-day history of repeated falls with headache and fever. While at home her fever got as high as 101F yesterday. Upon arrival to the emergency department patient was noted to have a blood glucose level of 474 and a urinalysis which was positive. Influenza screens were negative and initial CT scan showed no acute changes to suggest a stroke. Patient states that she was previously labeled as prediabetic approximately 2 years ago. She made diet and lifestyle changes at that time.   Hospital Course:   Diabetes mellitus type 2, uncontrolled, new diagnosis Patient has history of prediabetes is, came in to the hospital complaining  about polydipsia, generalized weakness. Blood glucose was 474 in the emergency department without evidence of acidosis. Hemoglobin A1c is 13.9 which correlate with mean plasma glucose of 352. Patient started on insulin, she will need insulin on discharge, patient to learn how to inject herself. Discharged home on 12 units of Lantus, 500 mg of metformin twice a day. Instructed to take her blood sugar log to PCP visit for further adjustment of diabetes regimen. Patient will need referral for dilated eye exam as no diagnosis of type 2 diabetes.  Hyperosmolar hyperglycemic state Patient presented to the hospital with blood glucose of 474, patient also has neurological manifestation. Neurological manifestations including generalized weakness, frequent falls and slurred speech. Osmolality not measured on admission, but patient probably was hyperosmolar. Patient treated aggressively with IV fluids, needed only subcutaneous insulin to bring the blood sugar down as she is insulin nave. This appears to be resolved.  UTI Urinalysis consistent with UTI, patient started Rocephin in the ED, culture showing GNR so far. No susceptibility on the day of discharge, Ceftin for 3 more days.  Hyponatremia Sodium of 127, this is pseudohyponatremia secondary to the hyperglycemia. Corrected sodium level of 136 for blood glucose of 474.  Peripheral neuropathy This is likely secondary to the diabetes mellitus type 2 and before that impaired fasting glucose.  Falls Frequent falls for the past 3 weeks, denies any head trauma. The reason of the fall is likely secondary to generalized weakness from the hyperglycemia and dehydration. CT and MRI of the brain showed no evidence of acute events.  Hypokalemia Potassium was 3.0 yesterday, given potassium supplementation, still 2.8 today. This aggressively  repleted with oral supplements, potassium is 4.0 on  discharge    Procedures:  None  Consultations:  None  Discharge Exam: Filed Vitals:   03/10/15 0639  BP: 115/60  Pulse: 89  Temp:   Resp:    General: Alert and awake, oriented x3, not in any acute distress. HEENT: anicteric sclera, pupils reactive to light and accommodation, EOMI CVS: S1-S2 clear, no murmur rubs or gallops Chest: clear to auscultation bilaterally, no wheezing, rales or rhonchi Abdomen: soft nontender, nondistended, normal bowel sounds, no organomegaly Extremities: no cyanosis, clubbing or edema noted bilaterally Neuro: Cranial nerves II-XII intact, no focal neurological deficits  Discharge Instructions   Discharge Instructions    Ambulatory referral to Nutrition and Diabetic Education    Complete by:  As directed   New diagnosis of DM. A1c 13.9%. Pt will need sister present for all sessions.          Current Discharge Medication List    START taking these medications   Details  blood glucose meter kit and supplies KIT Dispense based on patient and insurance preference. Use up to four times daily as directed. (FOR ICD-10 E11.65). Qty: 1 each, Refills: 0    cefUROXime (CEFTIN) 500 MG tablet Take 1 tablet (500 mg total) by mouth 2 (two) times daily with a meal. Qty: 6 tablet, Refills: 0    insulin glargine (LANTUS) 100 UNIT/ML injection Inject 0.12 mLs (12 Units total) into the skin at bedtime. Qty: 10 mL, Refills: 11    metFORMIN (GLUCOPHAGE) 500 MG tablet Take 1 tablet (500 mg total) by mouth 2 (two) times daily with a meal. Qty: 60 tablet, Refills: 0      CONTINUE these medications which have NOT CHANGED   Details  acetaminophen (TYLENOL) 500 MG tablet Take 500 mg by mouth every 6 (six) hours as needed for mild pain or moderate pain.    amitriptyline (ELAVIL) 25 MG tablet Take 50 mg by mouth at bedtime.     Cyanocobalamin (B-12) 2500 MCG TABS Take 2,500 mg by mouth daily at 12 noon.     lamoTRIgine (LAMICTAL) 200 MG tablet Take 200 mg by  mouth 2 (two) times daily.    propranolol (INNOPRAN XL) 120 MG 24 hr capsule Take 120 mg by mouth daily at 12 noon.       STOP taking these medications     diclofenac (VOLTAREN) 75 MG EC tablet        Allergies  Allergen Reactions  . Amoxicillin Shortness Of Breath  . Bee Venom Shortness Of Breath  . Penicillins Anaphylaxis      . Pneumococcal Vaccines     Per patient due to Brain Aneurysm  . Sulfa Antibiotics Hives, Itching and Swelling   Follow-up Information    Follow up with Garnet Koyanagi, DO In 1 week.   Specialty:  Family Medicine   Contact information:   [address] [place] [address] [phone]        The results of significant diagnostics from this hospitalization (including imaging, microbiology, ancillary and laboratory) are listed below for reference.    Significant Diagnostic Studies: Dg Chest 2 View  03/07/2015  CLINICAL DATA:  Four-day history of nausea, vomiting and headaches. EXAM: CHEST  2 VIEW COMPARISON:  June 23, 2013 FINDINGS: The heart size and mediastinal contours are within normal limits. There is no focal infiltrate, pulmonary edema, or pleural effusion. Minimal atelectasis of bilateral lung bases are noted. The visualized skeletal structures are  unremarkable. IMPRESSION: No active cardiopulmonary disease. Electronically Signed   By: Abelardo Diesel M.D.   On: 03/07/2015 13:44   Ct Head Wo Contrast  03/07/2015  CLINICAL DATA:  Recent falls. Hyperglycemia with vomiting. Altered mental status EXAM: CT HEAD WITHOUT CONTRAST TECHNIQUE: Contiguous axial images were obtained from the base of the skull through the vertex without intravenous contrast. COMPARISON:  None. FINDINGS: The ventricles are normal in size and configuration. There is a small cavum septum pellucidum, an anatomic variant. The patient has had a previous right temporal craniotomy with a surgical clip placed in the periphery of the anterior right temporal lobe  region. There is moderate artifact from the clip in this area. With the proviso that there is artifact in the anterior right temporal/posterior right frontal regions, there is no apparent hemorrhage, extra-axial fluid collection, or midline shift. There is a posterior fossa extra-axial/arachnoid cyst measuring 1.3 x 1.1 cm. No other evidence of mass. In areas that can be assessed, the gray-white compartments appear normal. There is no acute infarct evident. The bony calvarium appears intact except for the postoperative change in the right temporal region. The mastoid air cells are clear. IMPRESSION: Postoperative change on the right with artifact from the clip in the periphery of the right posterior frontal -anterior temporal region. No demonstrable intracranial hemorrhage, subdural or epidural fluid, or focal gray - white compartment lesions/acute appearing infarct. There is an arachnoid cyst in the posterior fossa region, a benign finding. No intra-axial mass. Electronically Signed   By: Lowella Grip III M.D.   On: 03/07/2015 14:29   Mr Brain Wo Contrast  03/09/2015  CLINICAL DATA:  Abnormal CT. History of aneurysm clipping. Now with fever and headache. Diabetic. EXAM: MRI HEAD WITHOUT CONTRAST TECHNIQUE: Multiplanar, multiecho pulse sequences of the brain and surrounding structures were obtained without intravenous contrast. COMPARISON:  CT head 03/07/2015 FINDINGS: Jearld Shines A clip has been placed and cleared for MRI. Right temporal craniotomy. Aneurysm clip present lateral to the right temporal lobe for right MCA aneurysm clipping in the past. This causes some artifact. There is mild encephalomalacia the right temporal tip. No acute infarct. Negative for hemorrhage or fluid collection. Ventricle size is normal. Negative for demyelinating disease Small cystic space in the retro cerebellar region in the midline likely prominent cisterna magna or arachnoid cyst. IMPRESSION: Postop clipping of right MCA  aneurysm. There is mild encephalomalacia in the right temporal tip. Negative for acute infarct. Negative for hemorrhage or mass. Ventricles not enlarged. Electronically Signed   By: Franchot Gallo M.D.   On: 03/09/2015 16:00    Microbiology: Recent Results (from the past 240 hour(s))  Urine culture     Status: None (Preliminary result)   Collection Time: 03/07/15  1:54 PM  Result Value Ref Range Status   Specimen Description URINE, RANDOM  Final   Special Requests NONE  Final   Culture   Final    >=100,000 COLONIES/mL GRAM NEGATIVE RODS SUSCEPTIBILITIES TO FOLLOW    Report Status PENDING  Incomplete     Labs: Basic Metabolic Panel:  Recent Labs Lab 03/07/15 1305 03/08/15 0453 03/08/15 0800 03/09/15 0951 03/10/15 0540  NA 127* 133*  --  136 134*  K 3.6 3.0*  --  2.8* 4.0  CL 91* 101  --  114* 102  CO2 23 19*  --  17* 21*  GLUCOSE 474* 267*  --  214* 170*  BUN 9 11  --  <5* <5*  CREATININE 0.99 0.93  --  0.63 0.75  CALCIUM 9.0 7.9*  --  6.7* 8.3*  MG  --   --  1.7  --  2.1   Liver Function Tests:  Recent Labs Lab 03/07/15 1305 03/08/15 0453  AST 37 19  ALT 30 21  ALKPHOS 120 89  BILITOT 0.9 0.9  PROT 6.9 5.6*  ALBUMIN 3.4* 2.6*   No results for input(s): LIPASE, AMYLASE in the last 168 hours. No results for input(s): AMMONIA in the last 168 hours. CBC:  Recent Labs Lab 03/07/15 1305 03/08/15 0453  WBC 16.9* 8.3  HGB 12.9 11.2*  HCT 37.6 33.8*  MCV 80.7 81.6  PLT 226 209   Cardiac Enzymes: No results for input(s): CKTOTAL, CKMB, CKMBINDEX, TROPONINI in the last 168 hours. BNP: BNP (last 3 results) No results for input(s): BNP in the last 8760 hours.  ProBNP (last 3 results) No results for input(s): PROBNP in the last 8760 hours.  CBG:  Recent Labs Lab 03/09/15 1735 03/09/15 2036 03/09/15 2308 03/10/15 0407 03/10/15 0853  GLUCAP 182* 207* 151* 140* 130*       Signed:  Kairon Shock A  [facility] 03/10/2015, 11:33  AM

## 2015-03-11 ENCOUNTER — Telehealth: Payer: Self-pay | Admitting: Family Medicine

## 2015-03-11 ENCOUNTER — Ambulatory Visit: Payer: Managed Care, Other (non HMO) | Admitting: Family Medicine

## 2015-03-11 MED ORDER — INSULIN GLARGINE 100 UNIT/ML SOLOSTAR PEN: 12.0000 [IU] | PEN_INJECTOR | Freq: Every day | SUBCUTANEOUS | Status: DC

## 2015-03-11 NOTE — Telephone Encounter (Signed)
Patient was sent to the ED last week and her A1c was 13.9. They put her on Lantus but sent the vial instead of the pend and insurance will not cover it. Please advise if it is ok to change.     KP

## 2015-03-11 NOTE — Telephone Encounter (Signed)
Yes its ok-- she also cancelled her hosp f/u

## 2015-03-11 NOTE — Telephone Encounter (Signed)
Caller name:Robin   Relationship to patient: Sister  Can be reached: (pt's#) [phone]  (sister's #) [phone]  Pharmacy: [facility], [address]  Reason for call: pt's sister called in because she says that when the pt was in the hospital they wrote her a Rx for a vial for her insulin but pt insurance will only cover the pin. She says that the vial to pay out of pocket is to expensive. She want to know if Dr Etter Sjogren will write her a Rx for the pin instead.    She says that pt didn't take her insulin last night because of this, she would like to have this done as soon as possible if possible so that pt can have her insulin for tonight    Thanks

## 2015-03-11 NOTE — Telephone Encounter (Signed)
Rx faxed.    KP 

## 2015-03-12 ENCOUNTER — Other Ambulatory Visit: Payer: Self-pay

## 2015-03-12 ENCOUNTER — Telehealth: Payer: Self-pay

## 2015-03-12 MED ORDER — PEN NEEDLES 31G X 5 MM MISC: 12.0000 [IU] | Freq: Every day | Status: DC

## 2015-03-12 MED ORDER — INSULIN GLARGINE 100 UNIT/ML SOLOSTAR PEN: 12.0000 [IU] | PEN_INJECTOR | Freq: Every day | SUBCUTANEOUS | Status: DC

## 2015-03-12 NOTE — Telephone Encounter (Signed)
Rx faxed.    KP 

## 2015-03-12 NOTE — Telephone Encounter (Signed)
Patient returning your call.

## 2015-03-12 NOTE — Telephone Encounter (Signed)
PCP: Garnet Koyanagi, DO  Admit date: 03/07/2015 Discharge date: 03/10/2015  Time spent: 40 minutes  Recommendations for Outpatient Follow-up:  1. Follow-up with primary care physician within one week. 2. Patient started on Lantus 12 units at night and metformin 500 mg twice a day. 3. Instructed take her blood sugar log to PCP for further adjustment of the diabetes regimen   Called.  No answer.  Left message for call back.

## 2015-03-12 NOTE — Telephone Encounter (Signed)
Call from [place], pts sister. [facility] needs the RX for the needles that go with the insulin pen before it can be used. Please send asap. Thanks.

## 2015-03-12 NOTE — Addendum Note (Signed)
Addended by: Ewing Schlein on: 03/12/2015 12:03 PM   Modules accepted: Orders

## 2015-03-12 NOTE — Telephone Encounter (Signed)
Spoke with Shirlean Mylar and she stated [facility] [address].  I made her aware the [facility] requested [facility] is where I sent it. She verbalized understanding and asked for [facility], [facility] at this time.      KP

## 2015-03-13 NOTE — Telephone Encounter (Signed)
Pt didn't know she had appt. Seemed a bit confused. She said her sister has been helping her. She is rescheduled for 03/19/15 11:30am.

## 2015-03-13 NOTE — Telephone Encounter (Signed)
Pt returned your call.  

## 2015-03-13 NOTE — Telephone Encounter (Signed)
Left a message for call back.  

## 2015-03-13 NOTE — Telephone Encounter (Signed)
Pt was no show 03/11/15 11:30am for hospital f/u, see other notes about pt hospital stay, charge or no charge?

## 2015-03-13 NOTE — Telephone Encounter (Signed)
Pt is returning your call. Pt is at work so she keeps missing your call.

## 2015-03-13 NOTE — Telephone Encounter (Signed)
Please find out why she cancelled.    She needs hosp f/u

## 2015-03-14 NOTE — Telephone Encounter (Signed)
Left a message for call back.  

## 2015-03-14 NOTE — Telephone Encounter (Signed)
No charge. 

## 2015-03-15 NOTE — Telephone Encounter (Signed)
Pt really needs hosp f/u---  Did hosp clear her to go back to work?

## 2015-03-15 NOTE — Telephone Encounter (Signed)
Left a message for call back.  

## 2015-03-15 NOTE — Telephone Encounter (Signed)
Pt cancelled hospital follow up appt stating she has to work on 03/19/15.  No other appt was scheduled at the time of cancellation.  See note below.    Name: Laura Gentry, Laura Gentry MRN: [id number] Date: 03/19/2015 Status: Can Time: 11:30 AM Length: 30 Visit Type: OFFICE VISIT 30 [368] Copay: $30.00 Provider: Rosalita Chessman, DO Department: [facility] Referring Provider: Rosalita Chessman CSN: [id number] Notes: hospital f/u KO Made On: Canceled: 03/13/2015 4:58 PM 03/14/2015 10:35 AM By: By: Earney Mallet, [place]: Patient (pt has to work on this day, she is unable to come in.)

## 2015-03-18 NOTE — Telephone Encounter (Signed)
Called to follow up with patient.  No answer.  Mailed letter informing patient that PCP would like to see her for hospital follow up.

## 2015-03-18 NOTE — Telephone Encounter (Signed)
Apt scheduled for 03/26/15     KP

## 2015-03-18 NOTE — Telephone Encounter (Signed)
Speaking with pt. She says that the hospital told her that she only needed to take a few days and she would be okay to go back to work.   ALSO, informed pt of provider hospital fu times and she says that she has to work, her schedule is until 8 pm. She says that she is off on Tuesdays at 3:30 and on Fridays at 4:30. Tried last week and today to scheduling fu appointment with pt but she says that she doesn't want to take any more time off. Informed pt that she really need to fu.     Please advise further.     CB: [phone]

## 2015-03-18 NOTE — Telephone Encounter (Signed)
We can see her Tuesday night--- im here late

## 2015-03-19 ENCOUNTER — Ambulatory Visit: Payer: Managed Care, Other (non HMO) | Admitting: Family Medicine

## 2015-03-26 ENCOUNTER — Encounter: Payer: Self-pay | Admitting: Family Medicine

## 2015-03-26 ENCOUNTER — Ambulatory Visit (INDEPENDENT_AMBULATORY_CARE_PROVIDER_SITE_OTHER): Payer: Managed Care, Other (non HMO) | Admitting: Family Medicine

## 2015-03-26 VITALS — BP 118/70 | HR 83 | Temp 98.3°F | Ht 65.0 in | Wt 155.8 lb

## 2015-03-26 DIAGNOSIS — E785 Hyperlipidemia, unspecified: Secondary | ICD-10-CM

## 2015-03-26 DIAGNOSIS — I10 Essential (primary) hypertension: Secondary | ICD-10-CM | POA: Diagnosis not present

## 2015-03-26 DIAGNOSIS — E1165 Type 2 diabetes mellitus with hyperglycemia: Secondary | ICD-10-CM | POA: Diagnosis not present

## 2015-03-26 DIAGNOSIS — E1151 Type 2 diabetes mellitus with diabetic peripheral angiopathy without gangrene: Secondary | ICD-10-CM

## 2015-03-26 DIAGNOSIS — IMO0002 Reserved for concepts with insufficient information to code with codable children: Secondary | ICD-10-CM

## 2015-03-26 NOTE — Assessment & Plan Note (Signed)
Glucose readings recently in 120s  con't lantus and metformin Recheck labs in 3 months ---call if any questions or concerns Pt given dm ed material and glucometer and was taught how to use the glucometer.

## 2015-03-26 NOTE — Patient Instructions (Signed)

## 2015-03-26 NOTE — Progress Notes (Signed)
Patient ID: Laura Gentry, female    DOB: [birth year]  Age: 59 y.o. MRN: [id number]    Subjective:  Subjective HPI Laura Gentry presents for f/u hospital --- DM II---glucose was over 500 and a1c 13---- CT/ MRI brain was done and was neg for acute stroke, hemorrhage etc.   Pt presented to the office 10/13 confused and unsteady on her feet-- she had been falling a lot and had a fever.  Pt was sent to ER for evaluation and dm was found to be uncontrolled Review of Systems  Constitutional: Negative for diaphoresis, appetite change, fatigue and unexpected weight change.  Eyes: Negative for pain, redness and visual disturbance.  Respiratory: Negative for cough, chest tightness, shortness of breath and wheezing.   Cardiovascular: Negative for chest pain, palpitations and leg swelling.  Endocrine: Negative for cold intolerance, heat intolerance, polydipsia, polyphagia and polyuria.  Genitourinary: Negative for dysuria, frequency and difficulty urinating.  Neurological: Negative for dizziness, light-headedness, numbness and headaches.    History Past Medical History  Diagnosis Date  . Diabetes ([place])   . High blood pressure   . Kidney stones   . Migraines   . Colon polyps   . Thyroid disease   . Brain aneurysm 03/19/13    She has past surgical history that includes Brain surgery (03/19/13); Inner ear surgery; Cholecystectomy (2013); Appendectomy; Tonsillectomy and adenoidectomy; and Vaginal hysterectomy.   Her family history includes Allergies in her mother; Asthma in her mother; Diabetes in her mother; Emphysema in her maternal aunt; Lung cancer in her father; Pancreatic cancer in her brother; Throat cancer in her father.She reports that she quit smoking about 3 years ago. Her smoking use included Cigarettes. She has a 20 pack-year smoking history. She has never used smokeless tobacco. She reports that she does not drink alcohol or use illicit drugs.  Current Outpatient Prescriptions on  File Prior to Visit  Medication Sig Dispense Refill  . acetaminophen (TYLENOL) 500 MG tablet Take 500 mg by mouth every 6 (six) hours as needed for mild pain or moderate pain.    Marland Kitchen amitriptyline (ELAVIL) 25 MG tablet Take 50 mg by mouth at bedtime.     . blood glucose meter kit and supplies KIT Dispense based on patient and insurance preference. Use up to four times daily as directed. (FOR ICD-10 E11.65). 1 each 0  . cefUROXime (CEFTIN) 500 MG tablet Take 1 tablet (500 mg total) by mouth 2 (two) times daily with a meal. 6 tablet 0  . Cyanocobalamin (B-12) 2500 MCG TABS Take 2,500 mg by mouth daily at 12 noon.     . Insulin Glargine (LANTUS SOLOSTAR) 100 UNIT/ML Solostar Pen Inject 12 Units into the skin at bedtime. 5 pen 11  . Insulin Pen Needle (PEN NEEDLES) 31G X 5 MM MISC Inject 12 Units into the skin at bedtime. 100 each 3  . lamoTRIgine (LAMICTAL) 200 MG tablet Take 200 mg by mouth 2 (two) times daily.    . metFORMIN (GLUCOPHAGE) 500 MG tablet Take 1 tablet (500 mg total) by mouth 2 (two) times daily with a meal. 60 tablet 0  . propranolol (INNOPRAN XL) 120 MG 24 hr capsule Take 120 mg by mouth daily at 12 noon.      No current facility-administered medications on file prior to visit.     Objective:  Objective Physical Exam  Constitutional: She is oriented to person, place, and time. She appears well-developed and well-nourished.  HENT:  Head: Normocephalic and atraumatic.  Eyes: Conjunctivae and EOM are normal.  Neck: Normal range of motion. Neck supple. No JVD present. Carotid bruit is not present. No thyromegaly present.  Cardiovascular: Normal rate, regular rhythm and normal heart sounds.   No murmur heard. Pulmonary/Chest: Effort normal and breath sounds normal. No respiratory distress. She has no wheezes. She has no rales. She exhibits no tenderness.  Musculoskeletal: She exhibits no edema.  Neurological: She is alert and oriented to person, place, and time.  Psychiatric: She  has a normal mood and affect. Her behavior is normal.  Nursing note and vitals reviewed. foot exam-- pt prefers to not do that today BP 118/70 mmHg  Pulse 83  Temp(Src) 98.3 F (36.8 C) (Oral)  Ht 5' 5"  (1.651 m)  Wt 155 lb 12.8 oz (70.67 kg)  BMI 25.93 kg/m2  SpO2 98% Wt Readings from Last 3 Encounters:  03/26/15 155 lb 12.8 oz (70.67 kg)  03/07/15 154 lb 11.2 oz (70.171 kg)  03/07/15 153 lb (69.4 kg)     Lab Results  Component Value Date   WBC 8.3 03/08/2015   HGB 11.2* 03/08/2015   HCT 33.8* 03/08/2015   PLT 209 03/08/2015   GLUCOSE 170* 03/10/2015   ALT 21 03/08/2015   AST 19 03/08/2015   NA 134* 03/10/2015   K 4.0 03/10/2015   CL 102 03/10/2015   CREATININE 0.75 03/10/2015   BUN <5* 03/10/2015   CO2 21* 03/10/2015   TSH 2.669 03/08/2015   HGBA1C 13.9* 03/07/2015    Dg Chest 2 View  03/07/2015  CLINICAL DATA:  Four-day history of nausea, vomiting and headaches. EXAM: CHEST  2 VIEW COMPARISON:  June 23, 2013 FINDINGS: The heart size and mediastinal contours are within normal limits. There is no focal infiltrate, pulmonary edema, or pleural effusion. Minimal atelectasis of bilateral lung bases are noted. The visualized skeletal structures are unremarkable. IMPRESSION: No active cardiopulmonary disease. Electronically Signed   By: Abelardo Diesel M.D.   On: 03/07/2015 13:44   Ct Head Wo Contrast  03/07/2015  CLINICAL DATA:  Recent falls. Hyperglycemia with vomiting. Altered mental status EXAM: CT HEAD WITHOUT CONTRAST TECHNIQUE: Contiguous axial images were obtained from the base of the skull through the vertex without intravenous contrast. COMPARISON:  None. FINDINGS: The ventricles are normal in size and configuration. There is a small cavum septum pellucidum, an anatomic variant. The patient has had a previous right temporal craniotomy with a surgical clip placed in the periphery of the anterior right temporal lobe region. There is moderate artifact from the clip in  this area. With the proviso that there is artifact in the anterior right temporal/posterior right frontal regions, there is no apparent hemorrhage, extra-axial fluid collection, or midline shift. There is a posterior fossa extra-axial/arachnoid cyst measuring 1.3 x 1.1 cm. No other evidence of mass. In areas that can be assessed, the gray-white compartments appear normal. There is no acute infarct evident. The bony calvarium appears intact except for the postoperative change in the right temporal region. The mastoid air cells are clear. IMPRESSION: Postoperative change on the right with artifact from the clip in the periphery of the right posterior frontal -anterior temporal region. No demonstrable intracranial hemorrhage, subdural or epidural fluid, or focal gray - white compartment lesions/acute appearing infarct. There is an arachnoid cyst in the posterior fossa region, a benign finding. No intra-axial mass. Electronically Signed   By: Lowella Grip III M.D.   On: 03/07/2015 14:29     Assessment & Plan:  Plan  I am having Laura Gentry maintain her B-12, propranolol, amitriptyline, lamoTRIgine, acetaminophen, metFORMIN, blood glucose meter kit and supplies, cefUROXime, Insulin Glargine, Pen Needles, ONE TOUCH ULTRA TEST, ONETOUCH DELICA LANCETS 44D, and butalbital-acetaminophen-caffeine.  Meds ordered this encounter  Medications  . ONE TOUCH ULTRA TEST test strip    Sig: USE TO TEST UPTO 4 TIMES A DAY AS DIRECTED (DX E11.65)    Refill:  0  . ONETOUCH DELICA LANCETS 39P MISC    Sig: USE TO TEST UPTO 4 TIMES A DAY AS DIRECTED    Refill:  0  . butalbital-acetaminophen-caffeine (FIORICET, ESGIC) 50-325-40 MG tablet    Sig: TAKE 1 OR 2 TABLETS BY MOUTH EVERY 8 HOURS AS NEEDED MAX OF 5 PER DAY    Refill:  3    Problem List Items Addressed This Visit    HLD (hyperlipidemia)    Check labs in 3 months Pt will needs statin-- just started lantus and metformin Will wait to add anything else until  then       Other Visit Diagnoses    DM (diabetes mellitus) type II uncontrolled, periph vascular disorder ([place])    -  Primary    Essential hypertension           Follow-up: Return in about 3 months (around 06/26/2015) for diabetes II, fasting.  Garnet Koyanagi, DO

## 2015-03-26 NOTE — Assessment & Plan Note (Signed)
Check labs in 3 months Pt will needs statin-- just started lantus and metformin Will wait to add anything else until then

## 2015-03-26 NOTE — Progress Notes (Signed)
Pre visit review using our clinic review tool, if applicable. No additional management support is needed unless otherwise documented below in the visit note. 

## 2015-04-01 ENCOUNTER — Telehealth: Payer: Self-pay | Admitting: Family Medicine

## 2015-04-01 MED ORDER — ONETOUCH DELICA LANCETS 33G MISC
Status: DC
Start: 2015-04-01 — End: 2015-12-25

## 2015-04-01 NOTE — Telephone Encounter (Signed)
Rx faxed.    KP 

## 2015-04-01 NOTE — Telephone Encounter (Signed)
Relation to FB:XUXY Call back [phone] [facility]acy: [id number] [place], [place] - [place] [phone] (Phone) [phone] (Fax)         Reason for call:  Patient requesting a refill ONETOUCH DELICA LANCETS 42L MISC

## 2015-04-09 ENCOUNTER — Ambulatory Visit: Payer: Managed Care, Other (non HMO) | Admitting: Dietician

## 2015-04-15 MED ORDER — GLUCOSE BLOOD VI STRP: ORAL_STRIP | Status: DC

## 2015-04-15 NOTE — Addendum Note (Signed)
Addended by: Ewing Schlein on: 04/15/2015 11:32 AM   Modules accepted: Orders

## 2015-04-15 NOTE — Telephone Encounter (Signed)
Rx faxed.    KP 

## 2015-05-06 ENCOUNTER — Telehealth: Payer: Self-pay | Admitting: Family Medicine

## 2015-05-06 MED ORDER — GLUCOSE BLOOD VI STRP: ORAL_STRIP | Status: DC

## 2015-05-06 MED ORDER — PEN NEEDLES 31G X 5 MM MISC: 12.0000 [IU] | Freq: Every day | Status: DC

## 2015-05-06 NOTE — Telephone Encounter (Signed)
Rx faxed.    KP 

## 2015-05-06 NOTE — Telephone Encounter (Signed)
[id number] [place], [place]  Reason for call: Pt calling about RX for test strips and lancets. Pharmacy told her they have RX on file for testing 1/day. Pt states she is testing 3/day, 2 hours after every meal. Pt is almost out and per pharmacy cannot refill til mid January.  Also needs needles for the insulin pen.

## 2015-05-08 ENCOUNTER — Telehealth: Payer: Self-pay

## 2015-05-08 NOTE — Telephone Encounter (Signed)
Patient walked into the office and stated that her insurance company has been sending over her FMLA paperwork but we have not responded, She gave me the number to the company and Representative said they have called on 4 occassions between the 4th and the 17th. 2 of the calls they asked for a fax number and the other 2 calls no one answered. I asked what was needed and she stated they needed office notes from Nov 1st until Now. They did not need anything signed by the Dr.At this time. She gave me [phone] attn: Estill Batten (Engineer, agricultural). Claim number [id number]. I gave the patient the claim number and made her aware of what was going on, all notes from the requested date was faxed along with October 13th notes.     KP

## 2015-05-09 ENCOUNTER — Telehealth: Payer: Self-pay | Admitting: Family Medicine

## 2015-05-09 NOTE — Telephone Encounter (Signed)
Left message for patient to call about flu shot °

## 2015-05-10 NOTE — Telephone Encounter (Signed)
I re-faxed the information.     KP

## 2015-05-10 NOTE — Telephone Encounter (Signed)
Pt said her insurance company is stating they did not receive anything. She is asking if you can call and talk to them based on below and refaxed it. They are telling her if they don't receive it she will be fired on 05/17/15.

## 2015-05-13 NOTE — Telephone Encounter (Signed)
Confirmation received.     KP

## 2015-06-14 ENCOUNTER — Encounter: Payer: Self-pay | Admitting: Family Medicine

## 2015-06-14 ENCOUNTER — Emergency Department (HOSPITAL_COMMUNITY)
Admission: EM | Admit: 2015-06-14 | Discharge: 2015-06-14 | Disposition: A | Discharge status: Home / Self Care | Payer: Managed Care, Other (non HMO) | Attending: Emergency Medicine | Admitting: Emergency Medicine

## 2015-06-14 ENCOUNTER — Emergency Department (HOSPITAL_COMMUNITY): Payer: Managed Care, Other (non HMO)

## 2015-06-14 ENCOUNTER — Ambulatory Visit (INDEPENDENT_AMBULATORY_CARE_PROVIDER_SITE_OTHER): Payer: Managed Care, Other (non HMO) | Admitting: Family Medicine

## 2015-06-14 ENCOUNTER — Encounter (HOSPITAL_COMMUNITY): Payer: Self-pay | Admitting: *Deleted

## 2015-06-14 VITALS — BP 146/92 | HR 90 | Temp 98.3°F | Ht 65.0 in | Wt 166.0 lb

## 2015-06-14 DIAGNOSIS — Z7984 Long term (current) use of oral hypoglycemic drugs: Secondary | ICD-10-CM | POA: Insufficient documentation

## 2015-06-14 DIAGNOSIS — Z880 Allergy status to penicillin: Secondary | ICD-10-CM | POA: Diagnosis not present

## 2015-06-14 DIAGNOSIS — E119 Type 2 diabetes mellitus without complications: Secondary | ICD-10-CM | POA: Diagnosis not present

## 2015-06-14 DIAGNOSIS — G43009 Migraine without aura, not intractable, without status migrainosus: Secondary | ICD-10-CM | POA: Diagnosis not present

## 2015-06-14 DIAGNOSIS — Z86010 Personal history of colonic polyps: Secondary | ICD-10-CM | POA: Insufficient documentation

## 2015-06-14 DIAGNOSIS — Z79899 Other long term (current) drug therapy: Secondary | ICD-10-CM | POA: Insufficient documentation

## 2015-06-14 DIAGNOSIS — I10 Essential (primary) hypertension: Secondary | ICD-10-CM | POA: Insufficient documentation

## 2015-06-14 DIAGNOSIS — Z87442 Personal history of urinary calculi: Secondary | ICD-10-CM | POA: Insufficient documentation

## 2015-06-14 DIAGNOSIS — R51 Headache: Secondary | ICD-10-CM | POA: Diagnosis not present

## 2015-06-14 DIAGNOSIS — R519 Headache, unspecified: Secondary | ICD-10-CM

## 2015-06-14 DIAGNOSIS — Z794 Long term (current) use of insulin: Secondary | ICD-10-CM | POA: Insufficient documentation

## 2015-06-14 DIAGNOSIS — Z87891 Personal history of nicotine dependence: Secondary | ICD-10-CM | POA: Diagnosis not present

## 2015-06-14 LAB — COMPREHENSIVE METABOLIC PANEL
ALT: 14 U/L (ref 14–54)
AST: 15 U/L (ref 15–41)
Albumin: 4.1 g/dL (ref 3.5–5.0)
Alkaline Phosphatase: 92 U/L (ref 38–126)
Anion gap: 14 (ref 5–15)
BUN: 10 mg/dL (ref 6–20)
CO2: 25 mmol/L (ref 22–32)
Calcium: 9.5 mg/dL (ref 8.9–10.3)
Chloride: 102 mmol/L (ref 101–111)
Creatinine, Ser: 0.71 mg/dL (ref 0.44–1.00)
GFR calc Af Amer: >60 mL/min (ref >60)
GFR calc non Af Amer: >60 mL/min (ref >60)
Glucose, Bld: 131 mg/dL — ABNORMAL HIGH (ref 65–99)
Potassium: 3.7 mmol/L (ref 3.5–5.1)
Sodium: 141 mmol/L (ref 135–145)
Total Bilirubin: 0.7 mg/dL (ref 0.3–1.2)
Total Protein: 7.5 g/dL (ref 6.5–8.1)

## 2015-06-14 LAB — CBC WITH DIFFERENTIAL/PLATELET
Basophils Absolute: 0 10*3/uL (ref 0.0–0.1)
Basophils Relative: 0 %
Eosinophils Absolute: 0.2 10*3/uL (ref 0.0–0.7)
Eosinophils Relative: 2 %
HCT: 41.5 % (ref 36.0–46.0)
Hemoglobin: 13.9 g/dL (ref 12.0–15.0)
Lymphocytes Relative: 39 %
Lymphs Abs: 3.5 10*3/uL (ref 0.7–4.0)
MCH: 26.5 pg (ref 26.0–34.0)
MCHC: 33.5 g/dL (ref 30.0–36.0)
MCV: 79 fL (ref 78.0–100.0)
Monocytes Absolute: 0.6 10*3/uL (ref 0.1–1.0)
Monocytes Relative: 6 %
Neutro Abs: 4.8 10*3/uL (ref 1.7–7.7)
Neutrophils Relative %: 53 %
Platelets: 319 10*3/uL (ref 150–400)
RBC: 5.25 MIL/uL — ABNORMAL HIGH (ref 3.87–5.11)
RDW: 14.3 % (ref 11.5–15.5)
WBC: 9.2 10*3/uL (ref 4.0–10.5)

## 2015-06-14 MED ORDER — HYDROMORPHONE HCL 1 MG/ML IJ SOLN
1.0000 mg | Freq: Once | INTRAMUSCULAR | Status: AC
  Administered 2015-06-14: 1 mg via INTRAVENOUS
  Filled 2015-06-14: qty 1

## 2015-06-14 MED ORDER — MORPHINE SULFATE (PF) 4 MG/ML IV SOLN
4.0000 mg | Freq: Once | INTRAVENOUS | Status: AC
  Administered 2015-06-14: 4 mg via INTRAVENOUS
  Filled 2015-06-14: qty 1

## 2015-06-14 MED ORDER — SULFAMETHOXAZOLE-TRIMETHOPRIM 800-160 MG PO TABS: 1.0000 | ORAL_TABLET | Freq: Two times a day (BID) | ORAL | Status: DC

## 2015-06-14 MED ORDER — LISINOPRIL 20 MG PO TABS: 20.0000 mg | ORAL_TABLET | Freq: Every day | ORAL | Status: DC

## 2015-06-14 MED ORDER — METOCLOPRAMIDE HCL 5 MG/ML IJ SOLN
10.0000 mg | Freq: Once | INTRAMUSCULAR | Status: AC
  Administered 2015-06-14: 10 mg via INTRAVENOUS
  Filled 2015-06-14: qty 2

## 2015-06-14 MED ORDER — OXYCODONE-ACETAMINOPHEN 5-325 MG PO TABS
ORAL_TABLET | ORAL | Status: AC
  Filled 2015-06-14: qty 1

## 2015-06-14 MED ORDER — METOCLOPRAMIDE HCL 10 MG PO TABS: 10.0000 mg | ORAL_TABLET | Freq: Four times a day (QID) | ORAL | Status: DC

## 2015-06-14 MED ORDER — IOHEXOL 350 MG/ML SOLN
50.0000 mL | Freq: Once | INTRAVENOUS | Status: AC | PRN
  Administered 2015-06-14: 50 mL via INTRAVENOUS

## 2015-06-14 MED ORDER — DIPHENHYDRAMINE HCL 50 MG/ML IJ SOLN
12.5000 mg | Freq: Once | INTRAMUSCULAR | Status: AC
  Administered 2015-06-14: 12.5 mg via INTRAVENOUS
  Filled 2015-06-14: qty 1

## 2015-06-14 MED ORDER — PROMETHAZINE HCL 25 MG/ML IJ SOLN
25.0000 mg | Freq: Once | INTRAMUSCULAR | Status: AC
  Administered 2015-06-14: 25 mg via INTRAVENOUS
  Filled 2015-06-14: qty 1

## 2015-06-14 MED ORDER — OXYCODONE-ACETAMINOPHEN 5-325 MG PO TABS
1.0000 | ORAL_TABLET | Freq: Once | ORAL | Status: AC
  Administered 2015-06-14: 1 via ORAL

## 2015-06-14 NOTE — ED Provider Notes (Signed)
CSN: [id number]     Arrival date & time 06/14/15  1642 History   First MD Initiated Contact with Patient 06/14/15 2019     Chief Complaint  Patient presents with  . Headache     (Consider location/radiation/quality/duration/timing/severity/associated sxs/prior Treatment) The history is provided by the patient and a relative.  Laura Gentry is a 60 y.o. female hx of HTN, DM, migraines, cerebral aneurysm s/p clipping here with headaches. She woke up around 8 AM with mild headache. Around 9:30 AM, she has acute onset of very severe headache. The headache is much more severe than her usual. She states that she had light sensitivity and nausea. Denies any fevers or chills. She went to see her primary care doctor, was concern for rupture aneurysm so sent her here for evaluation. He had a CT head in triage that showed no obvious bleeding. She states that the previous aneurysm was clipped in 2014 by Dr. Owens Shark at [facility].    Past Medical History  Diagnosis Date  . Diabetes ([place])   . High blood pressure   . Kidney stones   . Migraines   . Colon polyps   . Thyroid disease   . Brain aneurysm 03/19/13   Past Surgical History  Procedure Laterality Date  . Brain surgery  03/19/13    Sugita aneyrsum clip can have MRI up to [place] scanned in   . Inner ear surgery      Lost Hearing  . Cholecystectomy  2013  . Appendectomy    . Tonsillectomy and adenoidectomy    . Vaginal hysterectomy     Family History  Problem Relation Age of Onset  . Emphysema Maternal Aunt     never smoked, spouse did  . Asthma Mother   . Allergies Mother   . Diabetes Mother   . Pancreatic cancer Brother   . Lung cancer Father     smoked  . Throat cancer Father     smoked   Social History  Substance Use Topics  . Smoking status: Former Smoker -- 0.50 packs/day for 40 years    Types: Cigarettes    Quit date: 05/26/2011  . Smokeless tobacco: Never Used  . Alcohol Use: No   OB History    No data  available     Review of Systems  Neurological: Positive for headaches.  All other systems reviewed and are negative.     Allergies  Amoxicillin; Bee venom; Penicillins; Pneumococcal vaccines; and Sulfa antibiotics  Home Medications   Prior to Admission medications   Medication Sig Start Date End Date Taking? Authorizing Provider  acetaminophen (TYLENOL) 500 MG tablet Take 500 mg by mouth every 6 (six) hours as needed for mild pain or moderate pain.    Historical Provider, MD  amitriptyline (ELAVIL) 25 MG tablet Take 50 mg by mouth at bedtime.     Historical Provider, MD  blood glucose meter kit and supplies KIT Dispense based on patient and insurance preference. Use up to four times daily as directed. (FOR ICD-10 E11.65). Patient not taking: Reported on 06/14/2015 03/10/15   Verlee Monte, MD  butalbital-acetaminophen-caffeine (FIORICET, ESGIC) 50-325-40 MG tablet TAKE 1 OR 2 TABLETS BY MOUTH EVERY 8 HOURS AS NEEDED MAX OF 5 PER DAY 03/21/15   Historical Provider, MD  Cyanocobalamin (B-12) 2500 MCG TABS Take 2,500 mg by mouth daily at 12 noon. Reported on 06/14/2015    Historical Provider, MD  glucose blood (ONETOUCH VERIO) test strip Check Blood sugar up  to four times daily. Dx:E11.9 05/06/15   Rosalita Chessman, DO  Insulin Glargine (LANTUS SOLOSTAR) 100 UNIT/ML Solostar Pen Inject 12 Units into the skin at bedtime. 03/12/15   Rosalita Chessman, DO  Insulin Pen Needle (PEN NEEDLES) 31G X 5 MM MISC Inject 12 Units into the skin at bedtime. 05/06/15   Rosalita Chessman, DO  lamoTRIgine (LAMICTAL) 200 MG tablet Take 200 mg by mouth 2 (two) times daily.    Historical Provider, MD  metFORMIN (GLUCOPHAGE) 500 MG tablet Take 1 tablet (500 mg total) by mouth 2 (two) times daily with a meal. Patient not taking: Reported on 06/14/2015 03/10/15   Verlee Monte, MD  ONE TOUCH ULTRA TEST test strip Reported on 06/14/2015 03/11/15   Historical Provider, MD  Glory Rosebush DELICA LANCETS 27P MISC USE TO TEST UPTO 4  TIMES A DAY AS DIRECTED Patient not taking: Reported on 06/14/2015 04/01/15   Rosalita Chessman, DO  propranolol (INNOPRAN XL) 120 MG 24 hr capsule Take 120 mg by mouth daily at 12 noon.     Historical Provider, MD   BP 145/84 mmHg  Pulse 88  Temp(Src) 98 F (36.7 C) (Oral)  Resp 18  SpO2 93% Physical Exam  Constitutional:  Uncomfortable, holding her head   HENT:  Head: Normocephalic.  Mouth/Throat: Oropharynx is clear and moist.  Eyes: Conjunctivae are normal. Pupils are equal, round, and reactive to light.  Neck: Normal range of motion. Neck supple.  Cardiovascular: Normal rate, regular rhythm and normal heart sounds.   Pulmonary/Chest: Effort normal and breath sounds normal. No respiratory distress. She has no wheezes. She has no rales.  Abdominal: Soft. Bowel sounds are normal. She exhibits no distension. There is no tenderness. There is no rebound.  Musculoskeletal: Normal range of motion. She exhibits no edema or tenderness.  Neurological:  Alert, CN 2-12 intact, nl strength throughout. No pronator drift   Skin: Skin is warm and dry.  Psychiatric: She has a normal mood and affect. Her behavior is normal. Judgment and thought content normal.  Nursing note and vitals reviewed.   ED Course  Procedures (including critical care time) Labs Review Labs Reviewed  CBC WITH DIFFERENTIAL/PLATELET - Abnormal; Notable for the following:    RBC 5.25 (*)    All other components within normal limits  COMPREHENSIVE METABOLIC PANEL - Abnormal; Notable for the following:    Glucose, Bld 131 (*)    All other components within normal limits    Imaging Review Ct Angio Head W/cm &/or Wo Cm  06/14/2015  CLINICAL DATA:  Initial evaluation for acute headache. History of aneurysm repair. EXAM: CT ANGIOGRAPHY HEAD TECHNIQUE: Multidetector CT imaging of the head was performed using the standard protocol during bolus administration of intravenous contrast. Multiplanar CT image reconstructions and MIPs  were obtained to evaluate the vascular anatomy. CONTRAST:  43m OMNIPAQUE IOHEXOL 350 MG/ML SOLN COMPARISON:  Prior noncontrast head CT performed earlier the same day. FINDINGS: CTA HEAD Anterior circulation: Visualized distal cervical segments of the internal carotid arteries or widely patent and well opacified. Petrous, cavernous, and supraclinoid segments are widely patent bilaterally. A1 segments widely patent. Anterior communicating artery normal. Anterior cerebral arteries well opacified to their distal aspects. M1 segments widely patent without stenosis or occlusion. Sequela of prior open aneurysm repair seen at the right MCA bifurcation with surgical clips in place. No definite residual aneurysm on this examination. Left MCA bifurcation normal. Distal MCA branches well opacified and symmetric. Posterior circulation: Vertebral arteries patent to  the vertebrobasilar junction. Posterior inferior cerebral arteries patent bilaterally. Basilar artery is somewhat diminutive but widely patent without stenosis. Superior cerebellar arteries patent bilaterally. P1 segments are hypoplastic with prominent posterior communicating arteries bilaterally. PCAs are well opacified to their distal aspects. Venous sinuses: Patent without evidence for venous sinus thrombosis. Anatomic variants: No anatomic variant. No new or residual aneurysm. Delayed phase:No abnormal enhancement. IMPRESSION: 1. Negative CTA with no acute abnormality within the major intracranial arterial vascular circulation. 2. Sequela of prior aneurysm repair at the right MCA bifurcation. No residual aneurysm or other complication identified. Electronically Signed   By: Jeannine Boga M.D.   On: 06/14/2015 22:40   Ct Head Wo Contrast  06/14/2015  CLINICAL DATA:  Acute onset of severe headache.  Initial encounter. EXAM: CT HEAD WITHOUT CONTRAST TECHNIQUE: Contiguous axial images were obtained from the base of the skull through the vertex without  intravenous contrast. COMPARISON:  CT of the head performed 03/07/2015, and MRI of the brain from 03/09/2015 FINDINGS: There is no evidence of acute infarction, mass lesion, or intra- or extra-axial hemorrhage on CT. An aneurysm clip is noted at the anterior aspect of the right temporal lobe, with overlying postoperative change. The posterior fossa, including the cerebellum, brainstem and fourth ventricle, is within normal limits. The third and lateral ventricles, and basal ganglia are unremarkable in appearance. The cerebral hemispheres are symmetric in appearance, with normal gray-white differentiation. No mass effect or midline shift is seen. There is no evidence of fracture; visualized osseous structures are unremarkable in appearance. The orbits are within normal limits. The paranasal sinuses and mastoid air cells are well-aerated. No significant soft tissue abnormalities are seen. IMPRESSION: No acute intracranial pathology seen on CT. Postoperative change again noted on the right. Electronically Signed   By: Garald Balding M.D.   On: 06/14/2015 18:30   I have personally reviewed and evaluated these images and lab results as part of my medical decision-making.   EKG Interpretation None      MDM   Final diagnoses:  None   Laura Gentry is a 60 y.o. female here with sudden onset worsening headaches. CT head nl in triage and given percocet in triage but still has photophobia and pain. Will get CT angio to r/o other aneurysms or leakage. Will give migraine cocktail.   11:31 PM CT angio showed no new aneurysms or bleed. Headache improved. Likely migraines. Patient has neurologist at [facility]. Recommend follow up with them for better headache control.     Wandra Arthurs, MD 06/14/15 [phone]

## 2015-06-14 NOTE — Discharge Instructions (Signed)
Take tylenol, motrin for pain.   Try reglan as needed for headaches   See your neurologist in a week to adjust your medicines.   Return to ER if you have worse headaches, vomiting, fevers, blurry vision.

## 2015-06-14 NOTE — ED Notes (Signed)
Pt called for vital sign update - no answer.

## 2015-06-14 NOTE — Progress Notes (Signed)
Pre visit review using our clinic review tool, if applicable. No additional management support is needed unless otherwise documented below in the visit note. 

## 2015-06-14 NOTE — ED Notes (Signed)
Pt is in triage waiting area.  Visitor requested another Percocet for pt due to pain.  Reviewed chart.  Pt states 1st Percocet did not help pain any.  Pt is next to go to treatment room based on time and acuity.  Updated pt on wait.  Pt agreeable to wait for EDP eval and orders.

## 2015-06-14 NOTE — ED Notes (Signed)
Pt reports hx of aneurysm. Having headache that started this am 0930 and is very severe. Having sensitivity to light and nausea, no vomiting. No neuro deficits noted at triage, a&ox4.

## 2015-06-14 NOTE — Progress Notes (Signed)
   Subjective:    Patient ID: Laura Gentry, female    DOB: 09/21/1955, 60 y.o.   MRN: [id number]  HPI Here with the onset earlier today of a severe headache. She does not normally have headaches and she does not have migraines. Of note she had a cerebral aneurysm clipped in 2014. She felt fine when she woke up today but about 2 hours later she rapidly developed a severe throbbing headache over the top of her head. This has made her nauseated but she has not vomited. Her vision is blurry a times, but there is no double vision or loss of vision. No slurred speech, no loss of use of the arms or legs. Bright lights make the pain worse. She has taken nothing for the pain all day. Now the worst of the headache is centered over both temples. She drove herself here.    Review of Systems  Eyes: Positive for photophobia and visual disturbance. Negative for pain, discharge, redness and itching.  Respiratory: Negative.   Cardiovascular: Negative.   Neurological: Positive for weakness and headaches. Negative for dizziness, tremors, seizures, syncope, facial asymmetry, speech difficulty, light-headedness and numbness.       Objective:   Physical Exam  Constitutional: She is oriented to person, place, and time.  Alert but in obvious pain, very photophobic   HENT:  Head: Normocephalic and atraumatic.  Right Ear: External ear normal.  Left Ear: External ear normal.  Nose: Nose normal.  Mouth/Throat: Oropharynx is clear and moist.  Eyes: Conjunctivae and EOM are normal. Pupils are equal, round, and reactive to light.  Neck: Normal range of motion. Neck supple. No thyromegaly present.  Cardiovascular: Normal rate, regular rhythm, normal heart sounds and intact distal pulses.   Pulmonary/Chest: Effort normal and breath sounds normal.  Lymphadenopathy:    She has no cervical adenopathy.  Neurological: She is alert and oriented to person, place, and time. She has normal reflexes. No cranial nerve  deficit. She exhibits normal muscle tone. Coordination normal.          Assessment & Plan:  Severe headache of uncertain etiology. This could be a migraine but with her hx of an aneurysm I think she will need at least a head CT to rule out a bleed, etc. She called her sister, Laura Gentry, who came to our clinic and who will drive the patient immediately to [facility] ER for further evaluation. No medications were given to her while here.

## 2015-06-28 ENCOUNTER — Encounter: Payer: Self-pay | Admitting: Family Medicine

## 2015-06-28 ENCOUNTER — Ambulatory Visit (INDEPENDENT_AMBULATORY_CARE_PROVIDER_SITE_OTHER): Payer: Managed Care, Other (non HMO) | Admitting: Family Medicine

## 2015-06-28 VITALS — BP 124/88 | HR 116 | Temp 98.1°F | Ht 65.0 in | Wt 162.8 lb

## 2015-06-28 DIAGNOSIS — E1165 Type 2 diabetes mellitus with hyperglycemia: Secondary | ICD-10-CM

## 2015-06-28 DIAGNOSIS — Z1159 Encounter for screening for other viral diseases: Secondary | ICD-10-CM

## 2015-06-28 DIAGNOSIS — Z23 Encounter for immunization: Secondary | ICD-10-CM

## 2015-06-28 DIAGNOSIS — Z794 Long term (current) use of insulin: Secondary | ICD-10-CM

## 2015-06-28 DIAGNOSIS — R51 Headache: Secondary | ICD-10-CM

## 2015-06-28 DIAGNOSIS — E1140 Type 2 diabetes mellitus with diabetic neuropathy, unspecified: Secondary | ICD-10-CM | POA: Diagnosis not present

## 2015-06-28 DIAGNOSIS — I671 Cerebral aneurysm, nonruptured: Secondary | ICD-10-CM | POA: Diagnosis not present

## 2015-06-28 DIAGNOSIS — E785 Hyperlipidemia, unspecified: Secondary | ICD-10-CM

## 2015-06-28 DIAGNOSIS — R519 Headache, unspecified: Secondary | ICD-10-CM

## 2015-06-28 DIAGNOSIS — I10 Essential (primary) hypertension: Secondary | ICD-10-CM | POA: Diagnosis not present

## 2015-06-28 DIAGNOSIS — Z114 Encounter for screening for human immunodeficiency virus [HIV]: Secondary | ICD-10-CM

## 2015-06-28 DIAGNOSIS — IMO0002 Reserved for concepts with insufficient information to code with codable children: Secondary | ICD-10-CM

## 2015-06-28 LAB — MICROALBUMIN / CREATININE URINE RATIO
Creatinine,U: 30.5 mg/dL
Microalb Creat Ratio: 14.1 mg/g (ref 0.0–30.0)
Microalb, Ur: 4.3 mg/dL — ABNORMAL HIGH (ref 0.0–1.9)

## 2015-06-28 LAB — CBC WITH DIFFERENTIAL/PLATELET
Basophils Absolute: 0 10*3/uL (ref 0.0–0.1)
Basophils Relative: 0.2 % (ref 0.0–3.0)
Eosinophils Absolute: 0 10*3/uL (ref 0.0–0.7)
Eosinophils Relative: 0.1 % (ref 0.0–5.0)
HCT: 45.3 % (ref 36.0–46.0)
Hemoglobin: 14.7 g/dL (ref 12.0–15.0)
Lymphocytes Relative: 15.4 % (ref 12.0–46.0)
Lymphs Abs: 2.2 10*3/uL (ref 0.7–4.0)
MCHC: 32.4 g/dL (ref 30.0–36.0)
MCV: 80.3 fl (ref 78.0–100.0)
Monocytes Absolute: 0.6 10*3/uL (ref 0.1–1.0)
Monocytes Relative: 4.1 % (ref 3.0–12.0)
Neutro Abs: 11.2 10*3/uL — ABNORMAL HIGH (ref 1.4–7.7)
Neutrophils Relative %: 80.2 % — ABNORMAL HIGH (ref 43.0–77.0)
Platelets: 402 10*3/uL — ABNORMAL HIGH (ref 150.0–400.0)
RBC: 5.64 Mil/uL — ABNORMAL HIGH (ref 3.87–5.11)
RDW: 14.8 % (ref 11.5–15.5)
WBC: 14 10*3/uL — ABNORMAL HIGH (ref 4.0–10.5)

## 2015-06-28 LAB — POCT URINALYSIS DIPSTICK
Bilirubin, UA: NEGATIVE
Blood, UA: NEGATIVE
Ketones, UA: NEGATIVE
Leukocytes, UA: NEGATIVE
Nitrite, UA: NEGATIVE
Protein, UA: NEGATIVE
Spec Grav, UA: 1.025
Urobilinogen, UA: 0.2
pH, UA: 6

## 2015-06-28 LAB — COMPREHENSIVE METABOLIC PANEL
ALT: 10 U/L (ref 0–35)
AST: 10 U/L (ref 0–37)
Albumin: 4.2 g/dL (ref 3.5–5.2)
Alkaline Phosphatase: 106 U/L (ref 39–117)
BUN: 15 mg/dL (ref 6–23)
CO2: 30 mEq/L (ref 19–32)
Calcium: 9.5 mg/dL (ref 8.4–10.5)
Chloride: 92 mEq/L — ABNORMAL LOW (ref 96–112)
Creatinine, Ser: 0.83 mg/dL (ref 0.40–1.20)
GFR: 74.49 mL/min (ref >60.00)
Glucose, Bld: 488 mg/dL — ABNORMAL HIGH (ref 70–99)
Potassium: 4.4 mEq/L (ref 3.5–5.1)
Sodium: 130 mEq/L — ABNORMAL LOW (ref 135–145)
Total Bilirubin: 0.4 mg/dL (ref 0.2–1.2)
Total Protein: 7.4 g/dL (ref 6.0–8.3)

## 2015-06-28 LAB — HEPATITIS C ANTIBODY: HCV Ab: NEGATIVE

## 2015-06-28 LAB — LIPID PANEL
Cholesterol: 261 mg/dL — ABNORMAL HIGH (ref 0–200)
HDL: 42 mg/dL (ref >39.00)
Total CHOL/HDL Ratio: 6
Triglycerides: 420 mg/dL — ABNORMAL HIGH (ref 0.0–149.0)

## 2015-06-28 LAB — LDL CHOLESTEROL, DIRECT: Direct LDL: 147 mg/dL

## 2015-06-28 LAB — HEMOGLOBIN A1C: Hgb A1c MFr Bld: 10.4 % — ABNORMAL HIGH (ref 4.6–6.5)

## 2015-06-28 LAB — HIV ANTIBODY (ROUTINE TESTING W REFLEX): HIV 1&2 Ab, 4th Generation: NONREACTIVE

## 2015-06-28 MED ORDER — METFORMIN HCL 500 MG PO TABS: 500.0000 mg | ORAL_TABLET | Freq: Two times a day (BID) | ORAL | Status: DC

## 2015-06-28 NOTE — Assessment & Plan Note (Signed)
Headaches are daily Neuro took her out of work permanently---her last day is tomorrow

## 2015-06-28 NOTE — Patient Instructions (Signed)

## 2015-06-28 NOTE — Assessment & Plan Note (Signed)
Check labs con't meds  Current outpatient prescriptions:  .  acetaminophen (TYLENOL) 500 MG tablet, Take 500 mg by mouth every 6 (six) hours as needed for mild pain or moderate pain., Disp: , Rfl:  .  amitriptyline (ELAVIL) 25 MG tablet, Take 50 mg by mouth at bedtime. , Disp: , Rfl:  .  blood glucose meter kit and supplies KIT, Dispense based on patient and insurance preference. Use up to four times daily as directed. (FOR ICD-10 E11.65)., Disp: 1 each, Rfl: 0 .  butalbital-acetaminophen-caffeine (FIORICET, ESGIC) 50-325-40 MG tablet, TAKE 1 OR 2 TABLETS BY MOUTH EVERY 8 HOURS AS NEEDED MAX OF 5 PER DAY, Disp: , Rfl: 3 .  Cyanocobalamin (B-12) 2500 MCG TABS, Take 2,500 mg by mouth daily at 12 noon. Reported on 06/14/2015, Disp: , Rfl:  .  glucose blood (ONETOUCH VERIO) test strip, Check Blood sugar up to four times daily. Dx:E11.9, Disp: 100 each, Rfl: 12 .  Insulin Glargine (LANTUS SOLOSTAR) 100 UNIT/ML Solostar Pen, Inject 12 Units into the skin at bedtime., Disp: 5 pen, Rfl: 11 .  Insulin Pen Needle (PEN NEEDLES) 31G X 5 MM MISC, Inject 12 Units into the skin at bedtime., Disp: 100 each, Rfl: 3 .  lamoTRIgine (LAMICTAL) 200 MG tablet, Take 200 mg by mouth 2 (two) times daily., Disp: , Rfl:  .  metoCLOPramide (REGLAN) 10 MG tablet, Take 1 tablet (10 mg total) by mouth every 6 (six) hours., Disp: 10 tablet, Rfl: 0 .  ONE TOUCH ULTRA TEST test strip, Reported on 06/14/2015, Disp: , Rfl: 0 .  ONETOUCH DELICA LANCETS 31R MISC, USE TO TEST UPTO 4 TIMES A DAY AS DIRECTED, Disp: 100 each, Rfl: 12 .  predniSONE (STERAPRED UNI-PAK 21 TAB) 10 MG (21) TBPK tablet, Take 10 mg by mouth daily., Disp: , Rfl:  .  propranolol (INNOPRAN XL) 120 MG 24 hr capsule, Take 120 mg by mouth daily at 12 noon. , Disp: , Rfl:  .  metFORMIN (GLUCOPHAGE) 500 MG tablet, Take 1 tablet (500 mg total) by mouth 2 (two) times daily with a meal., Disp: 60 tablet, Rfl: 2

## 2015-06-28 NOTE — Progress Notes (Signed)
Patient ID: Laura Gentry, female    DOB: 1956-03-17  Age: 60 y.o. MRN: [id number]    Subjective:  Subjective HPI Laura Gentry presents for f/u DM, cholesterol and bp.  She was in the ER 1/20 after being seen at [facility] for severe , sudden headache.  W/u was neg-- pt is on prednisone.    Review of Systems  Constitutional: Negative for diaphoresis, appetite change, fatigue and unexpected weight change.  Eyes: Negative for pain, redness and visual disturbance.  Respiratory: Negative for cough, chest tightness, shortness of breath and wheezing.   Cardiovascular: Negative for chest pain, palpitations and leg swelling.  Endocrine: Negative for cold intolerance, heat intolerance, polydipsia, polyphagia and polyuria.  Genitourinary: Negative for dysuria, frequency and difficulty urinating.  Neurological: Negative for dizziness, light-headedness, numbness and headaches.    History Past Medical History  Diagnosis Date  . Diabetes ([place])   . High blood pressure   . Kidney stones   . Migraines   . Colon polyps   . Thyroid disease   . Brain aneurysm 03/19/13    She has past surgical history that includes Brain surgery (03/19/13); Inner ear surgery; Cholecystectomy (2013); Appendectomy; Tonsillectomy and adenoidectomy; and Vaginal hysterectomy.   Her family history includes Allergies in her mother; Asthma in her mother; Diabetes in her mother; Emphysema in her maternal aunt; Lung cancer in her father; Pancreatic cancer in her brother; Throat cancer in her father.She reports that she quit smoking about 4 years ago. Her smoking use included Cigarettes. She has a 20 pack-year smoking history. She has never used smokeless tobacco. She reports that she does not drink alcohol or use illicit drugs.  Current Outpatient Prescriptions on File Prior to Visit  Medication Sig Dispense Refill  . acetaminophen (TYLENOL) 500 MG tablet Take 500 mg by mouth every 6 (six) hours as needed for mild pain or  moderate pain.    Marland Kitchen amitriptyline (ELAVIL) 25 MG tablet Take 50 mg by mouth at bedtime.     . blood glucose meter kit and supplies KIT Dispense based on patient and insurance preference. Use up to four times daily as directed. (FOR ICD-10 E11.65). 1 each 0  . butalbital-acetaminophen-caffeine (FIORICET, ESGIC) 50-325-40 MG tablet TAKE 1 OR 2 TABLETS BY MOUTH EVERY 8 HOURS AS NEEDED MAX OF 5 PER DAY  3  . Cyanocobalamin (B-12) 2500 MCG TABS Take 2,500 mg by mouth daily at 12 noon. Reported on 06/14/2015    . glucose blood (ONETOUCH VERIO) test strip Check Blood sugar up to four times daily. Dx:E11.9 100 each 12  . Insulin Glargine (LANTUS SOLOSTAR) 100 UNIT/ML Solostar Pen Inject 12 Units into the skin at bedtime. 5 pen 11  . Insulin Pen Needle (PEN NEEDLES) 31G X 5 MM MISC Inject 12 Units into the skin at bedtime. 100 each 3  . lamoTRIgine (LAMICTAL) 200 MG tablet Take 200 mg by mouth 2 (two) times daily.    . metoCLOPramide (REGLAN) 10 MG tablet Take 1 tablet (10 mg total) by mouth every 6 (six) hours. 10 tablet 0  . ONE TOUCH ULTRA TEST test strip Reported on 06/14/2015  0  . ONETOUCH DELICA LANCETS 51T MISC USE TO TEST UPTO 4 TIMES A DAY AS DIRECTED 100 each 12  . propranolol (INNOPRAN XL) 120 MG 24 hr capsule Take 120 mg by mouth daily at 12 noon.      No current facility-administered medications on file prior to visit.     Objective:  Objective Physical  Exam  Constitutional: She is oriented to person, place, and time. She appears well-developed and well-nourished.  HENT:  Head: Normocephalic and atraumatic.  Eyes: Conjunctivae and EOM are normal.  Neck: Normal range of motion. Neck supple. No JVD present. Carotid bruit is not present. No thyromegaly present.  Cardiovascular: Normal rate, regular rhythm and normal heart sounds.   No murmur heard. Pulmonary/Chest: Effort normal and breath sounds normal. No respiratory distress. She has no wheezes. She has no rales. She exhibits no  tenderness.  Musculoskeletal: She exhibits no edema.  Neurological: She is alert and oriented to person, place, and time.  Psychiatric: She has a normal mood and affect. Her behavior is normal. Judgment and thought content normal.  Nursing note and vitals reviewed. Sensory exam of the foot is normal, tested with the monofilament. Good pulses, no lesions or ulcers, good peripheral pulses.  BP 124/88 mmHg  Pulse 116  Temp(Src) 98.1 F (36.7 C) (Oral)  Ht _0  (1.651 m)  Wt 162 lb 12.8 oz (73.846 kg)  BMI 27.09 kg/m2  SpO2 100% Wt Readings from Last 3 Encounters:  06/28/15 162 lb 12.8 oz (73.846 kg)  06/14/15 166 lb (75.297 kg)  03/26/15 155 lb 12.8 oz (70.67 kg)     Lab Results  Component Value Date   WBC 14.0* 06/28/2015   HGB 14.7 06/28/2015   HCT 45.3 06/28/2015   PLT 402.0* 06/28/2015   GLUCOSE 488* 06/28/2015   CHOL 261* 06/28/2015   TRIG * 06/28/2015    420.0 Triglyceride is over 400; calculations on Lipids are invalid.   HDL 42.00 06/28/2015   LDLDIRECT 147.0 06/28/2015   ALT 10 06/28/2015   AST 10 06/28/2015   NA 130* 06/28/2015   K 4.4 06/28/2015   CL 92* 06/28/2015   CREATININE 0.83 06/28/2015   BUN 15 06/28/2015   CO2 30 06/28/2015   TSH 2.669 03/08/2015   HGBA1C 10.4* 06/28/2015   MICROALBUR 4.3* 06/28/2015    Ct Angio Head W/cm &/or Wo Cm  06/14/2015  CLINICAL DATA:  Initial evaluation for acute headache. History of aneurysm repair. EXAM: CT ANGIOGRAPHY HEAD TECHNIQUE: Multidetector CT imaging of the head was performed using the standard protocol during bolus administration of intravenous contrast. Multiplanar CT image reconstructions and MIPs were obtained to evaluate the vascular anatomy. CONTRAST:  43m OMNIPAQUE IOHEXOL 350 MG/ML SOLN COMPARISON:  Prior noncontrast head CT performed earlier the same day. FINDINGS: CTA HEAD Anterior circulation: Visualized distal cervical segments of the internal carotid arteries or widely patent and well opacified.  Petrous, cavernous, and supraclinoid segments are widely patent bilaterally. A1 segments widely patent. Anterior communicating artery normal. Anterior cerebral arteries well opacified to their distal aspects. M1 segments widely patent without stenosis or occlusion. Sequela of prior open aneurysm repair seen at the right MCA bifurcation with surgical clips in place. No definite residual aneurysm on this examination. Left MCA bifurcation normal. Distal MCA branches well opacified and symmetric. Posterior circulation: Vertebral arteries patent to the vertebrobasilar junction. Posterior inferior cerebral arteries patent bilaterally. Basilar artery is somewhat diminutive but widely patent without stenosis. Superior cerebellar arteries patent bilaterally. P1 segments are hypoplastic with prominent posterior communicating arteries bilaterally. PCAs are well opacified to their distal aspects. Venous sinuses: Patent without evidence for venous sinus thrombosis. Anatomic variants: No anatomic variant. No new or residual aneurysm. Delayed phase:No abnormal enhancement. IMPRESSION: 1. Negative CTA with no acute abnormality within the major intracranial arterial vascular circulation. 2. Sequela of prior aneurysm repair at the right  MCA bifurcation. No residual aneurysm or other complication identified. Electronically Signed   By: Jeannine Boga M.D.   On: 06/14/2015 22:40   Ct Head Wo Contrast  06/14/2015  CLINICAL DATA:  Acute onset of severe headache.  Initial encounter. EXAM: CT HEAD WITHOUT CONTRAST TECHNIQUE: Contiguous axial images were obtained from the base of the skull through the vertex without intravenous contrast. COMPARISON:  CT of the head performed 03/07/2015, and MRI of the brain from 03/09/2015 FINDINGS: There is no evidence of acute infarction, mass lesion, or intra- or extra-axial hemorrhage on CT. An aneurysm clip is noted at the anterior aspect of the right temporal lobe, with overlying  postoperative change. The posterior fossa, including the cerebellum, brainstem and fourth ventricle, is within normal limits. The third and lateral ventricles, and basal ganglia are unremarkable in appearance. The cerebral hemispheres are symmetric in appearance, with normal gray-white differentiation. No mass effect or midline shift is seen. There is no evidence of fracture; visualized osseous structures are unremarkable in appearance. The orbits are within normal limits. The paranasal sinuses and mastoid air cells are well-aerated. No significant soft tissue abnormalities are seen. IMPRESSION: No acute intracranial pathology seen on CT. Postoperative change again noted on the right. Electronically Signed   By: Garald Balding M.D.   On: 06/14/2015 18:30     Assessment & Plan:  Plan I am having Ms. Lovena Le maintain her B-12, propranolol, amitriptyline, lamoTRIgine, acetaminophen, blood glucose meter kit and supplies, Insulin Glargine, ONE TOUCH ULTRA TEST, butalbital-acetaminophen-caffeine, ONETOUCH DELICA LANCETS 32G, glucose blood, Pen Needles, metoCLOPramide, predniSONE, and metFORMIN.  Meds ordered this encounter  Medications  . predniSONE (STERAPRED UNI-PAK 21 TAB) 10 MG (21) TBPK tablet    Sig: Take 10 mg by mouth daily.  . metFORMIN (GLUCOPHAGE) 500 MG tablet    Sig: Take 1 tablet (500 mg total) by mouth 2 (two) times daily with a meal.    Dispense:  60 tablet    Refill:  2    Problem List Items Addressed This Visit      Unprioritized   HBP (high blood pressure)   Relevant Orders   Comp Met (CMET) (Completed)   CBC with Differential/Platelet (Completed)   POCT urinalysis dipstick (Completed)   Microalbumin / creatinine urine ratio (Completed)   Aneurysm, cerebral, nonruptured    Headaches are daily Neuro took her out of work permanently---her last day is tomorrow       Other Visit Diagnoses    uncontrolled DM II with diabetic neuropathy, with long term current use insulin    (Active)  -  Primary    Relevant Medications    metFORMIN (GLUCOPHAGE) 500 MG tablet    Other Relevant Orders    Comp Met (CMET) (Completed)    CBC with Differential/Platelet (Completed)    Hemoglobin A1c (Completed)    POCT urinalysis dipstick (Completed)    Microalbumin / creatinine urine ratio (Completed)    Hyperlipidemia        Relevant Orders    CBC with Differential/Platelet (Completed)    Lipid panel (Completed)    POCT urinalysis dipstick (Completed)    Microalbumin / creatinine urine ratio (Completed)    Need for hepatitis C screening test        Relevant Orders    Hepatitis C antibody (Completed)    Encounter for screening for HIV        Relevant Orders    HIV antibody (Completed)    Need for shingles vaccine  Relevant Orders    Varicella-zoster vaccine subcutaneous (Completed)       Follow-up: Return in about 3 months (around 09/25/2015), or if symptoms worsen or fail to improve, for hypertension, hyperlipidemia, diabetes II.  Garnet Koyanagi, DO

## 2015-06-28 NOTE — Progress Notes (Signed)
Pre visit review using our clinic review tool, if applicable. No additional management support is needed unless otherwise documented below in the visit note. 

## 2015-07-02 ENCOUNTER — Telehealth: Payer: Self-pay | Admitting: Family Medicine

## 2015-07-02 DIAGNOSIS — E1165 Type 2 diabetes mellitus with hyperglycemia: Principal | ICD-10-CM

## 2015-07-02 DIAGNOSIS — E1140 Type 2 diabetes mellitus with diabetic neuropathy, unspecified: Secondary | ICD-10-CM

## 2015-07-02 DIAGNOSIS — IMO0002 Reserved for concepts with insufficient information to code with codable children: Secondary | ICD-10-CM

## 2015-07-02 MED ORDER — SIMVASTATIN 20 MG PO TABS: 20.0000 mg | ORAL_TABLET | Freq: Every day | ORAL | Status: DC

## 2015-07-02 MED ORDER — METFORMIN HCL 1000 MG PO TABS: 1000.0000 mg | ORAL_TABLET | Freq: Two times a day (BID) | ORAL | Status: DC

## 2015-07-02 NOTE — Telephone Encounter (Signed)
Caller name: Self  Can be reached: [phone]   Reason for call: Patient states she received a call yesterday but did not get to the phone in time and is requesting a call back

## 2015-07-02 NOTE — Telephone Encounter (Signed)
Notes Recorded by Rosalita Chessman, DO on 06/29/2015 at 1:45 PM Dm not controlled--- inc metformin 1000mg  1, 2 x a day, #47m 2 refills Cholesterol--- LDL goal < 70, HDL >40, TG < 150. Diet and exercise will increase HDL and decrease LDL and TG. Fish, Fish Oil, Flaxseed oil will also help increase the HDL and decrease Triglycerides.  Recheck labs in 3 months---zocor 20 mg #30 1 tab every night. 2 refills Lipid, cmp, hgba1c.

## 2015-07-02 NOTE — Telephone Encounter (Signed)
Patient has been made aware of her results and verbalized understanding. No questions at this time. Both med's have been sent to the pharmacy.     KP

## 2015-09-15 ENCOUNTER — Other Ambulatory Visit: Payer: Self-pay | Admitting: Family Medicine

## 2015-09-26 ENCOUNTER — Ambulatory Visit (INDEPENDENT_AMBULATORY_CARE_PROVIDER_SITE_OTHER): Payer: Self-pay | Admitting: Family Medicine

## 2015-09-26 ENCOUNTER — Encounter: Payer: Self-pay | Admitting: Family Medicine

## 2015-09-26 ENCOUNTER — Ambulatory Visit: Payer: Managed Care, Other (non HMO) | Admitting: Family Medicine

## 2015-09-26 VITALS — BP 114/84 | HR 80 | Temp 98.1°F | Wt 168.2 lb

## 2015-09-26 DIAGNOSIS — E1165 Type 2 diabetes mellitus with hyperglycemia: Secondary | ICD-10-CM

## 2015-09-26 DIAGNOSIS — R251 Tremor, unspecified: Secondary | ICD-10-CM

## 2015-09-26 DIAGNOSIS — IMO0002 Reserved for concepts with insufficient information to code with codable children: Secondary | ICD-10-CM

## 2015-09-26 DIAGNOSIS — Z8659 Personal history of other mental and behavioral disorders: Secondary | ICD-10-CM

## 2015-09-26 DIAGNOSIS — Z87898 Personal history of other specified conditions: Secondary | ICD-10-CM

## 2015-09-26 DIAGNOSIS — E1151 Type 2 diabetes mellitus with diabetic peripheral angiopathy without gangrene: Secondary | ICD-10-CM

## 2015-09-26 DIAGNOSIS — E785 Hyperlipidemia, unspecified: Secondary | ICD-10-CM

## 2015-09-26 DIAGNOSIS — G43809 Other migraine, not intractable, without status migrainosus: Secondary | ICD-10-CM

## 2015-09-26 LAB — COMPREHENSIVE METABOLIC PANEL
ALT: 12 U/L (ref 0–35)
AST: 10 U/L (ref 0–37)
Albumin: 4.5 g/dL (ref 3.5–5.2)
Alkaline Phosphatase: 99 U/L (ref 39–117)
BUN: 12 mg/dL (ref 6–23)
CO2: 26 mEq/L (ref 19–32)
Calcium: 9.5 mg/dL (ref 8.4–10.5)
Chloride: 97 mEq/L (ref 96–112)
Creatinine, Ser: 0.84 mg/dL (ref 0.40–1.20)
GFR: 73.41 mL/min (ref >60.00)
Glucose, Bld: 351 mg/dL — ABNORMAL HIGH (ref 70–99)
Potassium: 4.7 mEq/L (ref 3.5–5.1)
Sodium: 133 mEq/L — ABNORMAL LOW (ref 135–145)
Total Bilirubin: 0.5 mg/dL (ref 0.2–1.2)
Total Protein: 7.6 g/dL (ref 6.0–8.3)

## 2015-09-26 LAB — POCT URINALYSIS DIPSTICK
Bilirubin, UA: NEGATIVE
Blood, UA: NEGATIVE
Glucose, UA: 1000
Leukocytes, UA: NEGATIVE
Nitrite, UA: NEGATIVE
Protein, UA: NEGATIVE
Spec Grav, UA: >=1.03
Urobilinogen, UA: 0.2
pH, UA: 5

## 2015-09-26 LAB — MICROALBUMIN / CREATININE URINE RATIO
Creatinine,U: 125.8 mg/dL
Microalb Creat Ratio: 3.1 mg/g (ref 0.0–30.0)
Microalb, Ur: 3.9 mg/dL — ABNORMAL HIGH (ref 0.0–1.9)

## 2015-09-26 LAB — LIPID PANEL
Cholesterol: 207 mg/dL — ABNORMAL HIGH (ref 0–200)
HDL: 34.2 mg/dL — ABNORMAL LOW (ref >39.00)
Total CHOL/HDL Ratio: 6
Triglycerides: 459 mg/dL — ABNORMAL HIGH (ref 0.0–149.0)

## 2015-09-26 LAB — LDL CHOLESTEROL, DIRECT: Direct LDL: 120 mg/dL

## 2015-09-26 LAB — HEMOGLOBIN A1C: Hgb A1c MFr Bld: 11 % — ABNORMAL HIGH (ref 4.6–6.5)

## 2015-09-26 NOTE — Patient Instructions (Signed)
Increase lantus to 15 u at night and increase by 2 units every 3 days if blood sugar remains over 130 fasting   Basic Carbohydrate Counting for Diabetes Mellitus Carbohydrate counting is a method for keeping track of the amount of carbohydrates you eat. Eating carbohydrates naturally increases the level of sugar (glucose) in your blood, so it is important for you to know the amount that is okay for you to have in every meal. Carbohydrate counting helps keep the level of glucose in your blood within normal limits. The amount of carbohydrates allowed is different for every person. A dietitian can help you calculate the amount that is right for you. Once you know the amount of carbohydrates you can have, you can count the carbohydrates in the foods you want to eat. Carbohydrates are found in the following foods:  Grains, such as breads and cereals.  Dried beans and soy products.  Starchy vegetables, such as potatoes, peas, and corn.  Fruit and fruit juices.  Milk and yogurt.  Sweets and snack foods, such as cake, cookies, candy, chips, soft drinks, and fruit drinks. CARBOHYDRATE COUNTING There are two ways to count the carbohydrates in your food. You can use either of the methods or a combination of both. Reading the "Nutrition Facts" on [place] The "Nutrition Facts" is an area that is included on the labels of almost all packaged food and beverages in the [place]. It includes the serving size of that food or beverage and information about the nutrients in each serving of the food, including the grams (g) of carbohydrate per serving.  Decide the number of servings of this food or beverage that you will be able to eat or drink. Multiply that number of servings by the number of grams of carbohydrate that is listed on the label for that serving. The total will be the amount of carbohydrates you will be having when you eat or drink this food or beverage. Learning Standard Serving Sizes of  Food When you eat food that is not packaged or does not include "Nutrition Facts" on the label, you need to measure the servings in order to count the amount of carbohydrates.A serving of most carbohydrate-rich foods contains about 15 g of carbohydrates. The following list includes serving sizes of carbohydrate-rich foods that provide 15 g ofcarbohydrate per serving:   1 slice of bread (1 oz) or 1 six-inch tortilla.    of a hamburger bun or English muffin.  4-6 crackers.   cup unsweetened dry cereal.    cup hot cereal.   cup rice or pasta.    cup mashed potatoes or  of a large baked potato.  1 cup fresh fruit or one small piece of fruit.    cup canned or frozen fruit or fruit juice.  1 cup milk.   cup plain fat-free yogurt or yogurt sweetened with artificial sweeteners.   cup cooked dried beans or starchy vegetable, such as peas, corn, or potatoes.  Decide the number of standard-size servings that you will eat. Multiply that number of servings by 15 (the grams of carbohydrates in that serving). For example, if you eat 2 cups of strawberries, you will have eaten 2 servings and 30 g of carbohydrates (2 servings x 15 g = 30 g). For foods such as soups and casseroles, in which more than one food is mixed in, you will need to count the carbohydrates in each food that is included. EXAMPLE OF CARBOHYDRATE COUNTING Sample Dinner  3  oz chicken breast.   cup of brown rice.   cup of corn.  1 cup milk.   1 cup strawberries with sugar-free whipped topping.  Carbohydrate Calculation Step 1: Identify the foods that contain carbohydrates:   Rice.   Corn.   Milk.   Strawberries. Step 2:Calculate the number of servings eaten of each:   2 servings of rice.   1 serving of corn.   1 serving of milk.   1 serving of strawberries. Step 3: Multiply each of those number of servings by 15 g:   2 servings of rice x 15 g = 30 g.   1 serving of corn x 15 g  = 15 g.   1 serving of milk x 15 g = 15 g.   1 serving of strawberries x 15 g = 15 g. Step 4: Add together all of the amounts to find the total grams of carbohydrates eaten: 30 g + 15 g + 15 g + 15 g = 75 g.   This information is not intended to replace advice given to you by your health care provider. Make sure you discuss any questions you have with your health care provider.   Document Released: 05/11/2005 Document Revised: 06/01/2014 Document Reviewed: 04/07/2013 Elsevier Interactive Patient Education [employer].

## 2015-09-26 NOTE — Progress Notes (Signed)
Pre visit review using our clinic review tool, if applicable. No additional management support is needed unless otherwise documented below in the visit note. 

## 2015-10-01 ENCOUNTER — Encounter: Payer: Self-pay | Admitting: Family Medicine

## 2015-10-02 ENCOUNTER — Other Ambulatory Visit: Payer: Self-pay

## 2015-10-02 DIAGNOSIS — E119 Type 2 diabetes mellitus without complications: Secondary | ICD-10-CM

## 2015-10-02 DIAGNOSIS — Z794 Long term (current) use of insulin: Secondary | ICD-10-CM

## 2015-10-02 MED ORDER — SIMVASTATIN 40 MG PO TABS: 40.0000 mg | ORAL_TABLET | Freq: Every day | ORAL | Status: DC

## 2015-10-08 ENCOUNTER — Encounter: Payer: Self-pay | Admitting: Family Medicine

## 2015-10-11 ENCOUNTER — Encounter: Payer: Self-pay | Admitting: Family Medicine

## 2015-10-14 ENCOUNTER — Telehealth: Payer: Self-pay | Admitting: Family Medicine

## 2015-10-14 DIAGNOSIS — E1165 Type 2 diabetes mellitus with hyperglycemia: Principal | ICD-10-CM

## 2015-10-14 DIAGNOSIS — E1140 Type 2 diabetes mellitus with diabetic neuropathy, unspecified: Secondary | ICD-10-CM

## 2015-10-14 DIAGNOSIS — IMO0002 Reserved for concepts with insufficient information to code with codable children: Secondary | ICD-10-CM

## 2015-10-14 NOTE — Telephone Encounter (Signed)
Caller name: Self  Can be reached @ [phone]     [id number] [address] [place] [address] [phone] (Phone) [phone] (Fax)        Reason for call: States that she needs the amount of her Metformin pills confirmed with her pharmacy. States that pharmacy gave her 38 pills but she suppose to have 60.

## 2015-10-15 MED ORDER — METFORMIN HCL 1000 MG PO TABS: 1000.0000 mg | ORAL_TABLET | Freq: Two times a day (BID) | ORAL | Status: DC

## 2015-10-15 NOTE — Telephone Encounter (Signed)
Per the pharmacy, the patient was inadvertently given 38 pills, however patient will soon need refill of the medication. Rx sent to the pharmacy. Patient was made aware and did not have any further questions or concerns.

## 2015-10-22 ENCOUNTER — Ambulatory Visit: Payer: Managed Care, Other (non HMO) | Admitting: Family Medicine

## 2015-11-08 ENCOUNTER — Ambulatory Visit: Payer: Self-pay | Admitting: Endocrinology

## 2015-12-20 ENCOUNTER — Encounter: Payer: Self-pay | Admitting: Family Medicine

## 2015-12-25 ENCOUNTER — Other Ambulatory Visit: Payer: Self-pay

## 2015-12-25 MED ORDER — BAYER CONTOUR NEXT TEST VI STRP: ORAL_STRIP | 3 refills | Status: DC

## 2015-12-25 MED ORDER — MICROLET NEXT LANCING DEVICE MISC: 1.0000 | Freq: Four times a day (QID) | 0 refills | Status: DC

## 2015-12-25 MED ORDER — MICROLET LANCETS MISC: 3 refills | Status: DC

## 2015-12-25 MED ORDER — BAYER CONTOUR NEXT MONITOR W/DEVICE KIT: PACK | 0 refills | Status: DC

## 2015-12-25 NOTE — Telephone Encounter (Signed)
Per note from the pharmacy, patient would like to switch meter and test strips to contour next to save money. This Rx cost is $132.36. The New Rx for the contour would be $33.50. Rx faxed.    KP

## 2015-12-27 ENCOUNTER — Ambulatory Visit: Payer: Self-pay | Admitting: Family Medicine

## 2016-01-06 ENCOUNTER — Other Ambulatory Visit: Payer: Self-pay | Admitting: Family Medicine

## 2016-01-06 DIAGNOSIS — IMO0002 Reserved for concepts with insufficient information to code with codable children: Secondary | ICD-10-CM

## 2016-01-06 DIAGNOSIS — E1165 Type 2 diabetes mellitus with hyperglycemia: Principal | ICD-10-CM

## 2016-01-06 DIAGNOSIS — E1140 Type 2 diabetes mellitus with diabetic neuropathy, unspecified: Secondary | ICD-10-CM

## 2016-09-03 ENCOUNTER — Other Ambulatory Visit: Payer: Self-pay | Admitting: Specialist

## 2016-09-03 DIAGNOSIS — G44209 Tension-type headache, unspecified, not intractable: Secondary | ICD-10-CM

## 2016-09-03 DIAGNOSIS — I671 Cerebral aneurysm, nonruptured: Secondary | ICD-10-CM

## 2016-09-07 ENCOUNTER — Ambulatory Visit
Admission: RE | Admit: 2016-09-07 | Discharge: 2016-09-07 | Disposition: A | Discharge status: Home / Self Care | Payer: Medicaid Other | Source: Ambulatory Visit | Attending: Specialist | Admitting: Specialist

## 2016-09-07 DIAGNOSIS — I671 Cerebral aneurysm, nonruptured: Secondary | ICD-10-CM

## 2016-09-07 DIAGNOSIS — G44209 Tension-type headache, unspecified, not intractable: Secondary | ICD-10-CM

## 2016-09-07 MED ORDER — IOPAMIDOL (ISOVUE-370) INJECTION 76%
75.0000 mL | Freq: Once | INTRAVENOUS | Status: AC | PRN
Start: 2016-09-07 — End: 2016-09-07
  Administered 2016-09-07: 75 mL via INTRAVENOUS

## 2016-09-21 ENCOUNTER — Encounter: Payer: Self-pay | Admitting: Internal Medicine

## 2016-09-21 ENCOUNTER — Ambulatory Visit (INDEPENDENT_AMBULATORY_CARE_PROVIDER_SITE_OTHER): Payer: Medicaid Other | Admitting: Internal Medicine

## 2016-09-21 VITALS — BP 126/55 | HR 59 | Temp 98.7°F | Ht 67.0 in | Wt 161.2 lb

## 2016-09-21 DIAGNOSIS — J029 Acute pharyngitis, unspecified: Secondary | ICD-10-CM | POA: Diagnosis not present

## 2016-09-21 DIAGNOSIS — Z8679 Personal history of other diseases of the circulatory system: Secondary | ICD-10-CM | POA: Diagnosis not present

## 2016-09-21 DIAGNOSIS — R110 Nausea: Secondary | ICD-10-CM

## 2016-09-21 DIAGNOSIS — Z820 Family history of epilepsy and other diseases of the nervous system: Secondary | ICD-10-CM

## 2016-09-21 DIAGNOSIS — G43909 Migraine, unspecified, not intractable, without status migrainosus: Secondary | ICD-10-CM

## 2016-09-21 DIAGNOSIS — J3489 Other specified disorders of nose and nasal sinuses: Secondary | ICD-10-CM | POA: Diagnosis not present

## 2016-09-21 DIAGNOSIS — Z87891 Personal history of nicotine dependence: Secondary | ICD-10-CM | POA: Diagnosis not present

## 2016-09-21 DIAGNOSIS — R251 Tremor, unspecified: Secondary | ICD-10-CM

## 2016-09-21 DIAGNOSIS — G479 Sleep disorder, unspecified: Secondary | ICD-10-CM

## 2016-09-21 DIAGNOSIS — G43009 Migraine without aura, not intractable, without status migrainosus: Secondary | ICD-10-CM

## 2016-09-21 DIAGNOSIS — E1142 Type 2 diabetes mellitus with diabetic polyneuropathy: Secondary | ICD-10-CM | POA: Diagnosis present

## 2016-09-21 DIAGNOSIS — F3341 Major depressive disorder, recurrent, in partial remission: Secondary | ICD-10-CM

## 2016-09-21 DIAGNOSIS — J302 Other seasonal allergic rhinitis: Secondary | ICD-10-CM

## 2016-09-21 DIAGNOSIS — Z86010 Personal history of colonic polyps: Secondary | ICD-10-CM | POA: Diagnosis not present

## 2016-09-21 DIAGNOSIS — E1140 Type 2 diabetes mellitus with diabetic neuropathy, unspecified: Secondary | ICD-10-CM

## 2016-09-21 DIAGNOSIS — Z794 Long term (current) use of insulin: Secondary | ICD-10-CM | POA: Diagnosis not present

## 2016-09-21 DIAGNOSIS — Z8659 Personal history of other mental and behavioral disorders: Secondary | ICD-10-CM

## 2016-09-21 LAB — GLUCOSE, CAPILLARY: Glucose-Capillary: 427 mg/dL — ABNORMAL HIGH (ref 65–99)

## 2016-09-21 LAB — POCT GLYCOSYLATED HEMOGLOBIN (HGB A1C): Hemoglobin A1C: 14

## 2016-09-21 MED ORDER — BLOOD GLUCOSE MONITOR KIT: PACK | 5 refills | Status: DC

## 2016-09-21 MED ORDER — ZOLPIDEM TARTRATE 10 MG PO TABS: 10.0000 mg | ORAL_TABLET | Freq: Every evening | ORAL | 0 refills | Status: DC | PRN

## 2016-09-21 MED ORDER — MICROLET NEXT LANCING DEVICE MISC: 1.0000 | Freq: Four times a day (QID) | 5 refills | Status: DC

## 2016-09-21 MED ORDER — BAYER CONTOUR NEXT MONITOR W/DEVICE KIT: PACK | 5 refills | Status: DC

## 2016-09-21 MED ORDER — TOPIRAMATE ER 100 MG PO SPRINKLE CAP24: 100.0000 mg | EXTENDED_RELEASE_CAPSULE | Freq: Every day | ORAL | 0 refills | Status: DC

## 2016-09-21 MED ORDER — GABAPENTIN 300 MG PO CAPS: ORAL_CAPSULE | ORAL | 0 refills | Status: AC

## 2016-09-21 MED ORDER — SERTRALINE HCL 100 MG PO TABS: 100.0000 mg | ORAL_TABLET | Freq: Every day | ORAL | 2 refills | Status: DC

## 2016-09-21 MED ORDER — METFORMIN HCL 1000 MG PO TABS: 1000.0000 mg | ORAL_TABLET | Freq: Two times a day (BID) | ORAL | 5 refills | Status: DC

## 2016-09-21 MED ORDER — ONDANSETRON HCL 8 MG PO TABS: 8.0000 mg | ORAL_TABLET | Freq: Three times a day (TID) | ORAL | 0 refills | Status: DC | PRN

## 2016-09-21 NOTE — Progress Notes (Signed)
CC: Establish care, diabetes follow up  HPI:  [name] is a 61 y.o. woman with PMHx as noted below who presents today to establish care with [facility] and for follow up of her diabetes.  Type 2 DM: Last A1c 11.0. Today her A1c is greater than 14. She states she was diagnosed with diabetes about 4-5 years ago shortly after her brain aneurysm was found and clipped. She reports she is no longer taking metformin because she did not follow-up with her last doctor and could not receive a prescription. She is taking Lantus 15 units at bedtime. She reports she is not checking her blood sugars because she ran out of test strips. She admits she has not been taking good care of her diabetes because she is more concerned about the medication she needs for her aneurysm and associated migraines. She does note sharp pains in both of her legs and was told previously that she had neuropathy.  Allergic Rhinitis: She reports having a runny nose "all the time." She notes associated sore throat and postnasal drip. She states her nose seems to run year-round. She also reports sinus pain and congestion at times. She denies any fevers. She has not tried any over-the-counter medication.  Migraines: She reports having migraines since her aneurysm was diagnosed and clipped about 5 years ago. Her typical migraine occurs about 1-2 times per month. She notes most of her migraines are not severe, but when they are severe she will be "down" for 2-3 days. She denies any triggers for her migraines. Her last migraine was 2 weeks ago. She describes her migraine as "all over" and "feeling like an explosion" in her head. She has associated photophobia and phonophobia. She follows with Dr. Trula Gentry in [place]. She is taking Topamax ER 100 mg daily, propranolol ER 120 mg daily, and Lamictal 200 mg twice a day.  Hx Right MCA Aneurysm: Reports she was diagnosed with an aneurysm about 4-5 years ago and this was clipped. She does note  some memory impairment since her surgery. She follows with Dr. Trula Gentry as above. She denies any seizures since her brain surgery. She is taking Topamax, propranolol, and Lamictal as above. She is also on gabapentin 300 mg in the morning, 300 mg at noon, and 600 mg in the evening. She does not know whether this medication is for seizure prophylaxis or her diabetes. She is also on donepezil 10 mg daily but does not know why she is taking this medicine.  Nausea: She reports having nausea for the past 2-3 years. She describes the nausea is coming in waves. She denies any association with food. She denies any associated abdominal pain or vomiting. She is taking Zofran as needed which relieves the nausea.  Hx Depression: Reports she has had depression in the past but currently does not feel depressed. She reports a good appetite and denies any feelings of anhedonia. She is currently taking sertraline 100 mg daily. Her PHQ-2 score today is 0.   Tremor: Reports being diagnosed with a tremor in both of her hands a few years ago. She states the tremor started out in the morning but then eventually occur throughout the day. She notes the tremor would occur when she was sitting at rest. She states the tremor became so severe that she couldn't hold a drink without shaking. She is currently taking primidone 50 mg daily. She reports her father and sister also have a tremor.  Difficulty Sleeping: Reports she has always had  difficulty sleeping. She has a hard time falling asleep and then will only sleep for a few hours before awakening again. She estimates that she only gets 3-4 hours of rest per night. She denies taking any daytime naps. She is currently taking Ambien 10 mg at bedtime about 2 times per week which does help her sleep. She only drinks one cup of coffee in the morning and does not watch TV at bedtime.  Hx Colon Polyps: Reports her last colonoscopy was about 3 years ago. She states she was told she had a few  polyps that were removed but does not know if they were cancerous or not. She believes the GI doctor told her to follow up in 3-5 years for a repeat colonoscopy but she has not done this yet. She denies any melena or hematochezia.  Past Medical History:  Diagnosis Date  . Brain aneurysm 03/19/13  . Colon polyps   . Diabetes ([place])   . High blood pressure   . Kidney stones   . Migraines   . Thyroid disease     Review of Systems:   All negative except per HPI  Physical Exam:  Vitals:   09/21/16 1053  BP: (!) 126/55  Pulse: (!) 59  Temp: 98.7 F (37.1 C)  TempSrc: Oral  SpO2: 97%  Weight: 161 lb 3.2 oz (73.1 kg)   General: Well-nourished woman in NAD HEENT: EOMI, PERRL, sclera anicteric, mucus membranes moist CV: RRR, no m/g/r Pulm: CTA bilaterally, breaths non-labored Abd: BS+, soft, non-tender, non-distended Ext: warm, no peripheral edema, ROM full  Neuro: alert and oriented x 3. Strength 5/5 in upper and lower extremities. Smile symmetric. Sensation symmetric and intact. Finger to nose normal.   Assessment & Plan:   See Encounters Tab for problem based charting.  Patient discussed with Dr. Lynnae January

## 2016-09-21 NOTE — Patient Instructions (Signed)
General Instructions: - Restart Metformin 1000 mg twice daily - Discuss with your Neurologist if you can stop Donepezil - Will have you see our pharmacist, Dr. Maudie Mercury about stopping Ambien and Sertraline - Can start Cetirizine 10 mg daily for your runny nose  Thank you for bringing your medicines today. This helps [place] keep you safe from mistakes.   Progress Toward Treatment Goals:  No flowsheet data found.  Self Care Goals & Plans:  No flowsheet data found.  No flowsheet data found.   Care Management & Community Referrals:  No flowsheet data found.

## 2016-09-22 DIAGNOSIS — G43909 Migraine, unspecified, not intractable, without status migrainosus: Secondary | ICD-10-CM | POA: Insufficient documentation

## 2016-09-22 DIAGNOSIS — Z86010 Personal history of colonic polyps: Secondary | ICD-10-CM | POA: Insufficient documentation

## 2016-09-22 DIAGNOSIS — R251 Tremor, unspecified: Secondary | ICD-10-CM | POA: Insufficient documentation

## 2016-09-22 DIAGNOSIS — J309 Allergic rhinitis, unspecified: Secondary | ICD-10-CM | POA: Insufficient documentation

## 2016-09-22 LAB — CMP14 + ANION GAP
ALT: 9 IU/L (ref 0–32)
AST: 10 IU/L (ref 0–40)
Albumin/Globulin Ratio: 1.6 (ref 1.2–2.2)
Albumin: 4.2 g/dL (ref 3.6–4.8)
Alkaline Phosphatase: 139 IU/L — ABNORMAL HIGH (ref 39–117)
Anion Gap: 18 mmol/L (ref 10.0–18.0)
BUN/Creatinine Ratio: 10 — ABNORMAL LOW (ref 12–28)
BUN: 8 mg/dL (ref 8–27)
Bilirubin Total: 0.3 mg/dL (ref 0.0–1.2)
CO2: 21 mmol/L (ref 18–29)
Calcium: 9.2 mg/dL (ref 8.7–10.3)
Chloride: 97 mmol/L (ref 96–106)
Creatinine, Ser: 0.84 mg/dL (ref 0.57–1.00)
GFR calc Af Amer: 87 mL/min/{1.73_m2} (ref >59)
GFR calc non Af Amer: 75 mL/min/{1.73_m2} (ref >59)
Globulin, Total: 2.6 g/dL (ref 1.5–4.5)
Glucose: 398 mg/dL — ABNORMAL HIGH (ref 65–99)
Potassium: 4.2 mmol/L (ref 3.5–5.2)
Sodium: 136 mmol/L (ref 134–144)
Total Protein: 6.8 g/dL (ref 6.0–8.5)

## 2016-09-22 LAB — CBC WITH DIFFERENTIAL/PLATELET
Basophils Absolute: 0.1 10*3/uL (ref 0.0–0.2)
Basos: 1 %
EOS (ABSOLUTE): 0.1 10*3/uL (ref 0.0–0.4)
Eos: 2 %
Hematocrit: 40.4 % (ref 34.0–46.6)
Hemoglobin: 13.1 g/dL (ref 11.1–15.9)
Immature Grans (Abs): 0 10*3/uL (ref 0.0–0.1)
Immature Granulocytes: 0 %
Lymphocytes Absolute: 2.6 10*3/uL (ref 0.7–3.1)
Lymphs: 30 %
MCH: 26 pg — ABNORMAL LOW (ref 26.6–33.0)
MCHC: 32.4 g/dL (ref 31.5–35.7)
MCV: 80 fL (ref 79–97)
Monocytes Absolute: 0.5 10*3/uL (ref 0.1–0.9)
Monocytes: 5 %
Neutrophils Absolute: 5.5 10*3/uL (ref 1.4–7.0)
Neutrophils: 62 %
Platelets: 325 10*3/uL (ref 150–379)
RBC: 5.03 x10E6/uL (ref 3.77–5.28)
RDW: 13.8 % (ref 12.3–15.4)
WBC: 8.7 10*3/uL (ref 3.4–10.8)

## 2016-09-22 LAB — MICROALBUMIN / CREATININE URINE RATIO
Creatinine, Urine: 83 mg/dL
Microalb/Creat Ratio: 10.1 mg/g creat (ref 0.0–30.0)
Microalbumin, Urine: 8.4 ug/mL

## 2016-09-22 LAB — LIPID PANEL
Chol/HDL Ratio: 7 ratio — ABNORMAL HIGH (ref 0.0–4.4)
Cholesterol, Total: 252 mg/dL — ABNORMAL HIGH (ref 100–199)
HDL: 36 mg/dL — ABNORMAL LOW (ref >39)
LDL Calculated: 156 mg/dL — ABNORMAL HIGH (ref 0–99)
Triglycerides: 300 mg/dL — ABNORMAL HIGH (ref 0–149)
VLDL Cholesterol Cal: 60 mg/dL — ABNORMAL HIGH (ref 5–40)

## 2016-09-22 NOTE — Assessment & Plan Note (Signed)
Patient reports a 5 year hx of migraines ever since she had surgery for her right MCA brain aneurysm. Her migraines have typical and atypical features. Will need to obtain records from her Neurologist, Dr. Trula Ore, to help confirm diagnosis. Will continue her Topamax, Lamictal, Propranolol, and Gabapentin.

## 2016-09-22 NOTE — Assessment & Plan Note (Addendum)
Reports tremors in both of her hands that occurred at rest. This was not appreciated on exam today. She is on Primidone 50 mg daily. Most likely an essential tremor given it's good response to Primidone and she also has a family hx of tremor. Will obtain records from her Neurologist.

## 2016-09-22 NOTE — Assessment & Plan Note (Signed)
Patient reports difficulty falling asleep and then reawakening at night. We discussed that Ambien is not a great medication for her to be on as there is a risk for falls. She only uses Ambien twice a week and was agreeable to stopping this medication. Will have her see Dr. Maudie Mercury to discuss expected withdrawal symptoms from Ambien and alternatives. We discussed sleep hygiene as well.

## 2016-09-22 NOTE — Assessment & Plan Note (Signed)
Patient reports she was diagnosed with a brain aneurysm about 5 years ago and underwent clipping of the aneurysm. Per CareEverywhere, she had a right frontal craniotomy with right MCA clipping on 03/19/13 with Dr. Alfred Levins (neurosurgeon) at [facility]. She had presented with a 5 week hx of intractable headaches. She has been managed on Topamax, Lamictal, Propranolol, and Gabapentin for seizure prophylaxis with her hx of brain surgery per the patient. She denies any hx of seizure. Will need to get records from Dr. Trula Ore to confirm she should be on all of these medications. She is also on Donepezil for memory impairment which she attributes to her brain surgery. Would be ideal if Donepezil and at least one of her antiepileptics could be stopped as these are sedating and she has not had a seizure.

## 2016-09-22 NOTE — Assessment & Plan Note (Signed)
Her chronic nausea is suspicious for gastroparesis given her long hx of uncontrolled diabetes. However, she does not note any correlation with food. Would consider gastric emptying study in near future. Continue Zofran PRN for now.

## 2016-09-22 NOTE — Assessment & Plan Note (Addendum)
Patient reports she had a prior colonoscopy about 3 years ago and was told to follow up in 3-5 years but has not done so. Per CareEverywhere records, she did have an EGD with biopsy and colonoscopy in 2013 and was recommended to follow up in 3 years for repeat colonoscopy. Unable to see the EGD and colonoscopy reports nor the pathology reports. Will need to obtain these records from Dr. Violeta Gelinas office (GI) and likely have patient undergo repeat colonoscopy soon. No warning symptoms currently. Will ask front office staff to help obtain records.

## 2016-09-22 NOTE — Assessment & Plan Note (Signed)
Patient's symptoms most consistent with allergic rhinitis. Advised her to try Cetirizine 10 mg daily.

## 2016-09-22 NOTE — Assessment & Plan Note (Addendum)
Her diabetes is uncontrolled with A1c >14. Advised patient to restart Metformin 1000 mg BID and to continue Lantus 15 units QHS for now. Refilled her test strips and recommended to test 4 times daily. Urine microalbumin/Cr ratio checked and was within normal limits. She is not on an ACE inhibitor. She will need referral to [facility] for retinopathy evaluation. A lipid panel was also checked and revealed elevated total cholesterol and LDL. She should be on a moderate intensity statin. Will discuss this with her. Continue Gabapentin 300 mg in AM, 300 mg at noon, and 600 mg in PM for peripheral neuropathy. Will have her follow up in 2 weeks for reassessment of her blood sugars.

## 2016-09-22 NOTE — Assessment & Plan Note (Signed)
Reports she is no longer having feelings of depression. PHQ-2 score is 0 today. Patient would like to get off as many medications as possible. Will have her see Dr. Maudie Mercury to discuss tapering this medication as well as Ambien (see separate note).

## 2016-09-23 ENCOUNTER — Other Ambulatory Visit: Payer: Self-pay | Admitting: Internal Medicine

## 2016-09-23 ENCOUNTER — Telehealth: Payer: Self-pay

## 2016-09-23 DIAGNOSIS — E1142 Type 2 diabetes mellitus with diabetic polyneuropathy: Secondary | ICD-10-CM

## 2016-09-23 MED ORDER — ACCU-CHEK SOFT TOUCH LANCETS MISC: 12 refills | Status: DC

## 2016-09-23 MED ORDER — ACCU-CHEK AVIVA PLUS W/DEVICE KIT: PACK | 0 refills | Status: DC

## 2016-09-23 MED ORDER — GLUCOSE BLOOD VI STRP: ORAL_STRIP | 12 refills | Status: DC

## 2016-09-23 NOTE — Progress Notes (Signed)
Internal Medicine Clinic Attending  Case discussed with Dr. Rivet soon after the resident saw the patient.  We reviewed the resident's history and exam and pertinent patient test results.  I agree with the assessment, diagnosis, and plan of care documented in the resident's note.  

## 2016-09-23 NOTE — Telephone Encounter (Signed)
Per pharmacy medicaid only pays for accu- check [employer] plus meter and supplies. Please send new Rx for meter and supplies.

## 2016-10-06 ENCOUNTER — Ambulatory Visit (INDEPENDENT_AMBULATORY_CARE_PROVIDER_SITE_OTHER): Payer: Medicaid Other | Admitting: Internal Medicine

## 2016-10-06 ENCOUNTER — Encounter: Payer: Self-pay | Admitting: Internal Medicine

## 2016-10-06 ENCOUNTER — Ambulatory Visit: Payer: Medicaid Other | Admitting: Pharmacist

## 2016-10-06 ENCOUNTER — Ambulatory Visit (INDEPENDENT_AMBULATORY_CARE_PROVIDER_SITE_OTHER): Payer: Medicaid Other | Admitting: Dietician

## 2016-10-06 VITALS — BP 143/67 | HR 59 | Temp 98.4°F | Ht 67.0 in | Wt 165.4 lb

## 2016-10-06 DIAGNOSIS — Z79899 Other long term (current) drug therapy: Secondary | ICD-10-CM

## 2016-10-06 DIAGNOSIS — Z713 Dietary counseling and surveillance: Secondary | ICD-10-CM

## 2016-10-06 DIAGNOSIS — E1142 Type 2 diabetes mellitus with diabetic polyneuropathy: Secondary | ICD-10-CM

## 2016-10-06 DIAGNOSIS — Z87891 Personal history of nicotine dependence: Secondary | ICD-10-CM

## 2016-10-06 DIAGNOSIS — Z794 Long term (current) use of insulin: Secondary | ICD-10-CM

## 2016-10-06 DIAGNOSIS — E119 Type 2 diabetes mellitus without complications: Secondary | ICD-10-CM

## 2016-10-06 DIAGNOSIS — E782 Mixed hyperlipidemia: Secondary | ICD-10-CM | POA: Diagnosis not present

## 2016-10-06 MED ORDER — ATORVASTATIN CALCIUM 40 MG PO TABS: 40.0000 mg | ORAL_TABLET | Freq: Every day | ORAL | 1 refills | Status: DC

## 2016-10-06 MED ORDER — INSULIN GLARGINE 100 UNIT/ML SOLOSTAR PEN
19.0000 [IU] | PEN_INJECTOR | Freq: Every day | SUBCUTANEOUS | 11 refills | Status: DC
Start: 2016-10-06 — End: 2016-12-31

## 2016-10-06 MED ORDER — EXENATIDE 5 MCG/0.02ML ~~LOC~~ SOPN: 5.0000 ug | PEN_INJECTOR | Freq: Two times a day (BID) | SUBCUTANEOUS | 3 refills | Status: DC

## 2016-10-06 NOTE — Patient Instructions (Signed)
It was a pleasure seeing you today. Thank you for choosing [facility] for your healthcare needs.   Please increase your insulin glargine dose 1019 units at night.  We have started a new medication called Byetta. Please use 24mcg twice daily as discussed in the office.  Please return to clinic in 1 month for ongoing management or sooner as needed.  If you experience any side effects of the medication please call the clinic and let us know.  I hope you have a great vacation!

## 2016-10-06 NOTE — Progress Notes (Signed)
Diabetes Self-Management Education  Visit Type: First/Initial  Appt. Start Time: 1100 Appt. End Time: 1200  10/06/2016  [name], identified by name and date of birth, is a 61 y.o. female with a diagnosis of Diabetes: Type 2.   ASSESSMENT  There were no vitals taken for this visit. There is no height or weight on file to calculate BMI.      Diabetes Self-Management Education - 10/06/16 1200      Visit Information   Visit Type First/Initial     Initial Visit   Diabetes Type Type 2   Are you currently following a meal plan? Yes   What type of meal plan do you follow? --  limits carbs, fat, healthy   Are you taking your medications as prescribed? Yes   Date Diagnosed 2014     Psychosocial Assessment   Patient Belief/Attitude about Diabetes Motivated to manage diabetes   Self-care barriers Lack of material resources   Self-management support Doctor's office;CDE visits   Patient Concerns Nutrition/Meal planning;Medication;Monitoring;Healthy Lifestyle;Glycemic Control;Weight Control;Support   Special Needs None   Preferred Learning Style No preference indicated   Learning Readiness Change in progress     Pre-Education Assessment   Patient understands the diabetes disease and treatment process. Needs Instruction   Patient understands incorporating nutritional management into lifestyle. Needs Instruction   Patient undertands incorporating physical activity into lifestyle. Needs Instruction   Patient understands using medications safely. Needs Instruction   Patient understands monitoring blood glucose, interpreting and using results Needs Instruction   Patient understands prevention, detection, and treatment of acute complications. Needs Instruction   Patient understands prevention, detection, and treatment of chronic complications. Needs Instruction   Patient understands how to develop strategies to address psychosocial issues. Needs Instruction   Patient understands  how to develop strategies to promote health/change behavior. Needs Instruction     Complications   Last HgB A1C per patient/outside source 14 %   How often do you check your blood sugar? 1-2 times/day   Fasting Blood glucose range (mg/dL) >200   Postprandial Blood glucose range (mg/dL) >200   Number of hypoglycemic episodes per month 0   Number of hyperglycemic episodes per week 100     Dietary Intake   Breakfast 1/2 banana   Lunch crackers, peanut butter   Dinner chicken, fish, vegetables   Beverage(s) water, half & half tea     Exercise   Exercise Type ADL's;Light (walking / raking leaves)     Patient Education   Previous Diabetes Education No   Disease state  Factors that contribute to the development of diabetes   Nutrition management  Role of diet in the treatment of diabetes and the relationship between the three main macronutrients and blood glucose level;Food label reading, portion sizes and measuring food.;Carbohydrate counting   Physical activity and exercise  Role of exercise on diabetes management, blood pressure control and cardiac health.   Medications Reviewed patients medication for diabetes, action, purpose, timing of dose and side effects.   Monitoring Purpose and frequency of SMBG.   Personal strategies to promote health Helped patient develop diabetes management plan for improving blood sugars     Individualized Goals (developed by patient)   Monitoring  test my blood glucose as discussed     Outcomes   Expected Outcomes Demonstrated interest in learning. Expect positive outcomes   Future DMSE 4-6 wks   Program Status Not Completed      Individualized Plan for Diabetes Self-Management Training:  Learning Objective:  Patient will have a greater understanding of diabetes self-management. Patient education plan is to attend individual and/or group sessions per assessed needs and concerns.   Plan:   There are no Patient Instructions on file for this  visit.  Expected Outcomes:  Demonstrated interest in learning. Expect positive outcomes  Education material provided: Living Well with Diabetes and Meal plan card  If problems or questions, patient to contact team via:  Phone  Future DSME appointment: 2-4 wks  Treshaun Carrico, Butch Penny, RD 10/06/2016 12:30 PM.

## 2016-10-06 NOTE — Addendum Note (Signed)
Addended by: Deirdre Evener on: 10/06/2016 02:08 PM   Modules accepted: Orders

## 2016-10-06 NOTE — Assessment & Plan Note (Signed)
The patient has a mixed lipid profile including hyperlipidemia and hypertriglyceridemia. She also has uncontrolled diabetes. She would benefit from high intensity statin therapy. She states she has never been on a statin before that she can recall. We discussed risks and benefits and she would like to go forward with the statin. We will start with atorvastatin 40 mg once daily. -- Atorvastatin 40 mg once daily

## 2016-10-06 NOTE — Progress Notes (Signed)
Reviewed medications with patient today, states she would like to continue all medications for now and consider tapering off of possibly donepezil after checking with Dr. Trula Ore. Medication reconciliation completed.

## 2016-10-06 NOTE — Patient Instructions (Addendum)
Hi Laura Gentry,  Just to summarize our visit:  1- Try to eat about 45 grams of carbohydrate at each meal( 3x/day) for a total of about 135(-150)grams carb/day. Low carb (<7 grams) snacks are okay too.  2- Inject Byetta under the skin into the fat of your abdomen as 0-60 minnutes before breakfast and dinner. These meals should be at least 6 hours apart.  Call us if you are so nauseated that you vomit. This should go away gradually as your body gets used to this hormone/drug. Byetta is a synthetic incretin hormone that helps control blood sugar by:   1- slowing stomach emptying,  2-  decreasing appetite,   3- decreasing how much sugar your liver makes   4- Increasing how much insulin your pancreas makes BUT only when your sugar is higher than normal. This means no side effect of low blood sugar with this medicine.  3- Check your blood sugar at least 4 times a day before meas and bedtime. IF you want to check 2 hours after eating to see what the food/medicine did to your blood sugar that is fine too. We usually work on getting the before meal blood sugars in line first, then work on the after meal blood sugars. (small bits at a time until we get it where we want it.)  Please make a follow up as soon as you'd like. I suggest in 2-4 weeks- at least when you see the doctor again.

## 2016-10-06 NOTE — Assessment & Plan Note (Addendum)
Patient with uncontrolled diabetes. Most recent hemoglobin A1c of 14. Patient was diagnosed with type 2 diabetes several years ago. However, her diabetes seems to be more consistent with a type I diabetic. As an example, last night the patient ate a protein with only vegetables and her postprandial glucose was over 350. She is currently on metformin 1000 mg twice a day and insulin glargine 12 units daily at bedtime. Given that her hemoglobin A1c is currently uncontrolled on her current regimen we will make several changes. I will increase her insulin glargine from 12 units in the evening to 19 units. We discussed starting mealtime insulin however the patient is concerned about weight gain and would like to consider other options. After shared decision making we have decided to start the patient on Byetta and we will start with 5 mcg twice a day for the first month. I also think she would benefit from workup for latent autoimmune diabetes in adults. We will check glutamic decarboxylase autoantibodies and IA-2 autoantibodies to help differentiate if the patient's symptoms are secondary to type 2 diabetes or latent autoimmune diabetes in adults. Our diabetes specialist and educator has met with the patient in clinic today and we have decided jointly to increase the patient's glargine as stated above and to start Byetta. -- GAD autoantibodies -- IA-2 autoantibodies -- Increase insulin glargine to 19 units once daily at bedtime -- Start Byetta 5 mcg twice a day for the first month -- Return to clinic in 1 month for follow-up  ADDENDUM The patient's GAD and IA-2  autoantibodies are normal. Thus, I do not think she has latent autoimmune diabetes in adults (LADA). I have sent a request that her results be sent to her.

## 2016-10-06 NOTE — Progress Notes (Signed)
   CC: Diabetes  HPI: [name] is a 61 y.o. female with a past medical history as listed below who presents for medical management of chronic medical conditions including diabetes and hypercholesterolemia.  Past Medical History:  Diagnosis Date  . Brain aneurysm 03/19/13  . Colon polyps   . Diabetes ([place])   . High blood pressure   . Kidney stones   . Migraines   . Thyroid disease      Review of Systems: Patient without acute complaints today. Does endorse polydipsia and polyuria.  Physical Exam: Vitals:   10/06/16 1029  BP: (!) 143/67  Pulse: (!) 59  Temp: 98.4 F (36.9 C)  TempSrc: Oral  SpO2: 98%  Weight: 165 lb 6.4 oz (75 kg)  Height: 5\' 7"  (1.702 m)   Head: Normocephalic, without obvious abnormality, atraumatic Lungs: clear to auscultation bilaterally Heart: regular rate and rhythm, S1, S2 normal, no murmur, click, rub or gallop Abdomen: soft, non-tender; bowel sounds normal; no masses,  no organomegaly Extremities: extremities normal, atraumatic, no cyanosis or edema  Assessment & Plan:  See encounters tab for problem based medical decision making. Patient discussed with Dr. Daryll Drown  Signed: Ophelia Shoulder, MD 10/06/2016, 10:37 AM  Pager: [phone]

## 2016-10-07 NOTE — Progress Notes (Signed)
Internal Medicine Clinic Attending  Case discussed with Dr. Vahey at the time of the visit.  We reviewed the resident's history and exam and pertinent patient test results.  I agree with the assessment, diagnosis, and plan of care documented in the resident's note. 

## 2016-10-12 ENCOUNTER — Telehealth: Payer: Self-pay | Admitting: *Deleted

## 2016-10-12 NOTE — Telephone Encounter (Signed)
-----   Message from Ophelia Shoulder, MD sent at 10/09/2016  8:46 AM EDT ----- Can we inform her that her labs are normal.  Its not urgent I just want to let her know.   Thanks, Jeneen Rinks

## 2016-10-12 NOTE — Telephone Encounter (Signed)
Pt called / informed " her labs are normal." per Dr Lovena Le. Pt stated great news and thanks for calling.

## 2016-10-13 LAB — IA-2 AUTOANTIBODIES: IA-2 Autoantibodies: <1 U/mL

## 2016-10-13 LAB — GLUTAMIC ACID DECARBOXYLASE AUTO ABS: Glutamic Acid Decarb Ab: <5 U/mL (ref 0.0–5.0)

## 2016-10-27 ENCOUNTER — Encounter: Payer: Medicaid Other | Admitting: Dietician

## 2016-12-31 ENCOUNTER — Ambulatory Visit (INDEPENDENT_AMBULATORY_CARE_PROVIDER_SITE_OTHER): Payer: Medicaid Other | Admitting: Internal Medicine

## 2016-12-31 ENCOUNTER — Ambulatory Visit: Payer: Medicaid Other | Admitting: Dietician

## 2016-12-31 VITALS — BP 126/74 | HR 77 | Temp 98.1°F | Wt 153.7 lb

## 2016-12-31 DIAGNOSIS — Z794 Long term (current) use of insulin: Secondary | ICD-10-CM

## 2016-12-31 DIAGNOSIS — Z9114 Patient's other noncompliance with medication regimen: Secondary | ICD-10-CM

## 2016-12-31 DIAGNOSIS — E1165 Type 2 diabetes mellitus with hyperglycemia: Secondary | ICD-10-CM | POA: Diagnosis not present

## 2016-12-31 DIAGNOSIS — Z87891 Personal history of nicotine dependence: Secondary | ICD-10-CM

## 2016-12-31 DIAGNOSIS — Z833 Family history of diabetes mellitus: Secondary | ICD-10-CM | POA: Diagnosis not present

## 2016-12-31 DIAGNOSIS — E1142 Type 2 diabetes mellitus with diabetic polyneuropathy: Secondary | ICD-10-CM | POA: Diagnosis not present

## 2016-12-31 LAB — POCT URINALYSIS DIPSTICK
Ketones, UA: NEGATIVE
Leukocytes, UA: NEGATIVE
Nitrite, UA: NEGATIVE
Spec Grav, UA: 1.02 (ref 1.010–1.025)
Urobilinogen, UA: 0.2 E.U./dL
pH, UA: 5 (ref 5.0–8.0)

## 2016-12-31 LAB — BASIC METABOLIC PANEL
Anion gap: 11 (ref 5–15)
BUN: 12 mg/dL (ref 6–20)
CO2: 22 mmol/L (ref 22–32)
Calcium: 9.2 mg/dL (ref 8.9–10.3)
Chloride: 97 mmol/L — ABNORMAL LOW (ref 101–111)
Creatinine, Ser: 0.94 mg/dL (ref 0.44–1.00)
GFR calc Af Amer: >60 mL/min (ref >60)
GFR calc non Af Amer: >60 mL/min (ref >60)
Glucose, Bld: 455 mg/dL — ABNORMAL HIGH (ref 65–99)
Potassium: 4.3 mmol/L (ref 3.5–5.1)
Sodium: 130 mmol/L — ABNORMAL LOW (ref 135–145)

## 2016-12-31 LAB — GLUCOSE, CAPILLARY: Glucose-Capillary: 472 mg/dL — ABNORMAL HIGH (ref 65–99)

## 2016-12-31 LAB — POCT GLYCOSYLATED HEMOGLOBIN (HGB A1C): Hemoglobin A1C: >14

## 2016-12-31 MED ORDER — INSULIN GLARGINE 100 UNIT/ML SOLOSTAR PEN: 22.0000 [IU] | PEN_INJECTOR | Freq: Every day | SUBCUTANEOUS | 11 refills | Status: DC

## 2016-12-31 MED ORDER — PEN NEEDLES 31G X 5 MM MISC: 12.0000 [IU] | Freq: Every day | 3 refills | Status: DC

## 2016-12-31 NOTE — Patient Instructions (Signed)
Thank you for visiting clinic today. As we discussed your blood sugar is very high and A1c is above 14. I am increasing your Lantus to 22 unit daily. Please check your blood sugar 4 times a day, preferably before meals and at bedtime and bring your glucometer meter and log with you during your next appointment in 1-2 weeks. Please continue to take Byetta as you were taking before, be regular and taking daily.

## 2016-12-31 NOTE — Assessment & Plan Note (Addendum)
Her A1c today was about 14 with CBG of 472. She did not have any ketones in her urine. Stat BMP shows sodium of 130 with corrected sodium for hyperglycemia was 136. Her bicarbonate and anion gap was normal.  Patient was stating that her blood sugar remains high all the time. She is also trying to lose some weight and watching for her diet, but very inconsistent with her medication.  Her uncontrolled diabetes is most likely because of noncompliance.  She was also advised to check her blood sugar regularly and bring her glucometer meter with her during next follow-up visit in 1-2 weeks with PCP.  -Increased Lantus to 22 units daily. -Continue Byetta. -She was advised to take her medications regularly.

## 2016-12-31 NOTE — Progress Notes (Signed)
   CC: Feeling of generalized lethargy and increased sleepiness for past 2 days.  HPI:  [name] is a 61 y.o. with past medical history as listed below came to the clinic with her sister, as her sister was concerned that patient was not feeling well for last few days, was complaining of generalized lethargy. 2 days ago she had a headache, which improved with medicine but patient was sleeping most of the time without eating or drinking anything for the past 2 days. She was very hard to arouse. This morning when sister woke her up, she remained initially drowsy, then become alert after taking a shower. Sister checked her blood sugar this morning and found to be above 400. When seen in the clinic patient appears normal, stating that whenever she had headaches she sleeps for 2-3 days after that. She denies any ongoing headache, blurry vision, nausea or vomiting, abdominal pain or diarrhea. According to patient she is feeling hungry today, she did not had any appetite for the past 2 days. She was denying any more sleepiness. She denied any urinary symptoms.  Her blood sugar was 472 in the clinic today. According to patient she stopped taking her metformin few months ago as it was causing nausea, abdominal pain and diarrhea. She was taking her Byetta and Lantus inconsistently, stating she takes her medications when she remembers to take it. She did not took any medications for the past 2 days.  Past Medical History:  Diagnosis Date  . Brain aneurysm 03/19/13  . Colon polyps   . Diabetes ([place]) 2013  . High blood pressure   . Kidney stones   . Migraines   . Thyroid disease    Review of Systems:  As per HPI.  Physical Exam:  Vitals:   12/31/16 1319  BP: 126/74  Pulse: 77  Temp: 98.1 F (36.7 C)  TempSrc: Oral  SpO2: 99%  Weight: 153 lb 11.2 oz (69.7 kg)   Vitals:   12/31/16 1319  BP: 126/74  Pulse: 77  Temp: 98.1 F (36.7 C)  TempSrc: Oral  SpO2: 99%  Weight: 153 lb  11.2 oz (69.7 kg)   General: Vital signs reviewed.  Patient is well-developed and well-nourished, in no acute distress and cooperative with exam.  Cardiovascular: RRR, S1 normal, S2 normal, no murmurs, gallops, or rubs. Pulmonary/Chest: Clear to auscultation bilaterally, no wheezes, rales, or rhonchi. Abdominal: Soft, non-tender, non-distended, BS +, no masses, organomegaly, or guarding present.  Extremities: No lower extremity edema bilaterally,  pulses symmetric and intact bilaterally. No cyanosis or clubbing. Neurological: A&O x3, Strength is normal and symmetric bilaterally, cranial nerve II-XII are grossly intact, no focal motor deficit, sensory intact to light touch bilaterally.  Skin: Warm, dry and intact. No rashes or erythema. Psychiatric: Normal mood and affect. speech and behavior is normal. Cognition and memory are normal.  Assessment & Plan:   See Encounters Tab for problem based charting.  Patient discussed with Dr. Daryll Drown.

## 2017-01-01 NOTE — Progress Notes (Signed)
Internal Medicine Clinic Attending  Case discussed with Dr. Amin at the time of the visit.  We reviewed the resident's history and exam and pertinent patient test results.  I agree with the assessment, diagnosis, and plan of care documented in the resident's note.    

## 2017-01-07 ENCOUNTER — Ambulatory Visit (INDEPENDENT_AMBULATORY_CARE_PROVIDER_SITE_OTHER): Payer: Medicaid Other | Admitting: Internal Medicine

## 2017-01-07 DIAGNOSIS — Z794 Long term (current) use of insulin: Secondary | ICD-10-CM

## 2017-01-07 DIAGNOSIS — G43009 Migraine without aura, not intractable, without status migrainosus: Secondary | ICD-10-CM

## 2017-01-07 DIAGNOSIS — E1142 Type 2 diabetes mellitus with diabetic polyneuropathy: Secondary | ICD-10-CM

## 2017-01-07 DIAGNOSIS — G43909 Migraine, unspecified, not intractable, without status migrainosus: Secondary | ICD-10-CM | POA: Diagnosis not present

## 2017-01-07 DIAGNOSIS — Z79899 Other long term (current) drug therapy: Secondary | ICD-10-CM

## 2017-01-07 MED ORDER — INSULIN GLARGINE 100 UNIT/ML SOLOSTAR PEN: 25.0000 [IU] | PEN_INJECTOR | Freq: Every day | SUBCUTANEOUS | 11 refills | Status: DC

## 2017-01-07 MED ORDER — EXENATIDE 5 MCG/0.02ML ~~LOC~~ SOPN: 10.0000 ug | PEN_INJECTOR | Freq: Two times a day (BID) | SUBCUTANEOUS | 3 refills | Status: DC

## 2017-01-07 NOTE — Assessment & Plan Note (Addendum)
She did not had any more headaches since her previous office visit.  Continue current management.

## 2017-01-07 NOTE — Patient Instructions (Signed)
Thank you for visiting clinic today. As we discussed your blood sugar remained high, I am increasing the dose of Byetta to 10 twice daily and Lantus to 25 units at bedtime. Please follow-up with me in 4 weeks, and keep checking your blood sugar couple of times daily preferably before meal and bring your glucometer meter with your next appointment.

## 2017-01-07 NOTE — Progress Notes (Signed)
   CC: For follow-up of her diabetes.  HPI:  [name] is a 61 y.o. lady with past medical history as listed below came to the clinic for follow-up of her diabetes.  She was seen in clinic 1 week ago with increased sleepiness, her CBG in clinic was about 400 and A1c was 14, she was noncompliant with her Lantus and Byetta, has stopped using metformin many months ago because of side effects. She was advised to regularly take her medication and follow-up with [place] and bring her glucometer meter. She did bring her glucometer meter today, which shows average blood sugar of 369, with low of 248 and high of 500. She started checking her blood sugar for from 01/01/2017.  She denies any more complaints, stating that she did not had any headaches since her previous visit. Her appetite was normal and she was overall feeling better.  Past Medical History:  Diagnosis Date  . Brain aneurysm 03/19/13  . Colon polyps   . Diabetes ([place]) 2013  . High blood pressure   . Kidney stones   . Migraines   . Thyroid disease    Review of Systems:  As per HPI.  Physical Exam:  Vitals:   01/07/17 0847  BP: 121/62  Pulse: 63  Temp: 97.7 F (36.5 C)  TempSrc: Oral  SpO2: 100%  Weight: 156 lb 14.4 oz (71.2 kg)  Height: 5\' 7"  (1.702 m)    General: Vital signs reviewed.  Patient is well-developed and well-nourished, in no acute distress and cooperative with exam.  Cardiovascular: RRR, S1 normal, S2 normal, no murmurs, gallops, or rubs. Pulmonary/Chest: Clear to auscultation bilaterally, no wheezes, rales, or rhonchi. Abdominal: Soft, non-tender, non-distended, BS +, no masses, organomegaly, or guarding present.  Extremities: No lower extremity edema bilaterally,  pulses symmetric and intact bilaterally. No cyanosis or clubbing. Neuro.A and O X 3. Cranial nerve grossly intact, strength and sensations grossly intact and symmetrical bilaterally. Skin: Warm, dry and intact. No rashes or  erythema. Psychiatric: Normal mood and affect. speech and behavior is normal. Cognition and memory are normal.  Assessment & Plan:   See Encounters Tab for problem based charting.  Patient discussed with Dr. Daryll Gentry.

## 2017-01-07 NOTE — Assessment & Plan Note (Signed)
Patient recently restarted checking her blood sugar and taking medication. Her A1c checked 1 week ago was 14. Continue to have elevated blood sugar at home during previous week while taking Lantus 22 units and Byetta 5 mcg twice a day.  -We will increase her Byetta to 10 twice daily. -Increase Lantus to 25 units. -Patient was instructed to check her blood sugar 2-3 times a day, preferably pre-meal and be compliant with her Byetta and Lantus. -She was also instructed for any signs of hypoglycemia and asked to report to [place] if she experienced any.

## 2017-01-11 ENCOUNTER — Telehealth: Payer: Self-pay | Admitting: *Deleted

## 2017-01-11 NOTE — Telephone Encounter (Signed)
Pt calls and states she would like to to drop her byetta back to 5mg  twice daily due to severe nausea, please advise States she has constant severe nausea since increasing to 10mg  twice daily Please advise

## 2017-01-11 NOTE — Telephone Encounter (Signed)
I called the patient and advise her to see if her nausea improved with the next day or 2, if she is unable to tolerated she can decrease her dose to her previous 5 mg twice daily. She was also advised to watch her diet very carefully and follow up in clinic within a month for any further intervention if needed. She was also advised to bring her glucometer meter with her during next follow-up.

## 2017-01-13 NOTE — Progress Notes (Signed)
Internal Medicine Clinic Attending  Case discussed with Dr. Amin at the time of the visit.  We reviewed the resident's history and exam and pertinent patient test results.  I agree with the assessment, diagnosis, and plan of care documented in the resident's note.    

## 2017-02-16 ENCOUNTER — Ambulatory Visit (INDEPENDENT_AMBULATORY_CARE_PROVIDER_SITE_OTHER): Payer: Medicaid Other | Admitting: Internal Medicine

## 2017-02-16 VITALS — BP 127/68 | HR 61 | Temp 98.4°F | Wt 160.5 lb

## 2017-02-16 DIAGNOSIS — Y93E9 Activity, other interior property and clothing maintenance: Secondary | ICD-10-CM

## 2017-02-16 DIAGNOSIS — Y92009 Unspecified place in unspecified non-institutional (private) residence as the place of occurrence of the external cause: Secondary | ICD-10-CM

## 2017-02-16 DIAGNOSIS — S20211A Contusion of right front wall of thorax, initial encounter: Secondary | ICD-10-CM

## 2017-02-16 DIAGNOSIS — G43909 Migraine, unspecified, not intractable, without status migrainosus: Secondary | ICD-10-CM | POA: Diagnosis not present

## 2017-02-16 DIAGNOSIS — W2209XA Striking against other stationary object, initial encounter: Secondary | ICD-10-CM

## 2017-02-16 DIAGNOSIS — E119 Type 2 diabetes mellitus without complications: Secondary | ICD-10-CM

## 2017-02-16 DIAGNOSIS — E785 Hyperlipidemia, unspecified: Secondary | ICD-10-CM

## 2017-02-16 DIAGNOSIS — W228XXA Striking against or struck by other objects, initial encounter: Secondary | ICD-10-CM

## 2017-02-16 MED ORDER — KETOROLAC TROMETHAMINE 30 MG/ML IJ SOLN
15.0000 mg | Freq: Once | INTRAMUSCULAR | Status: AC
  Administered 2017-02-16: 15 mg via INTRAMUSCULAR

## 2017-02-16 MED ORDER — NAPROXEN 500 MG PO TABS: 500.0000 mg | ORAL_TABLET | Freq: Two times a day (BID) | ORAL | 0 refills | Status: DC

## 2017-02-16 NOTE — Progress Notes (Signed)
   CC: right sided pain  HPI:  [name] is a 62 y.o. with a PMH of T2DM, HLD, and migraines presenting to clinic for evaluation of right sided rib pain.  Patient states that 2 days ago she was cleaning in her house and trying to reach over her counter top; she subsequently ended up jumping slightly and hitting her right rib cage on the counter's edge. Since then, she has had significant pain at the site. She endorses trying tylenol, icy/hot, and warm compresses without relief of her pain. She endorses exacerbation of pain with deep breathing, coughing, sudden movements. She denies shortness of breath, break in skin, dizziness, falls, syncope, fevers, chills, chest pain.   Please see problem based Assessment and Plan for status of patients chronic conditions.  Past Medical History:  Diagnosis Date  . Brain aneurysm 03/19/13  . Colon polyps   . Diabetes ([place]) 2013  . High blood pressure   . Kidney stones   . Migraines   . Thyroid disease     Review of Systems:   ROS Per HPI  Physical Exam:  Vitals:   02/16/17 1527  BP: 127/68  Pulse: 61  Temp: 98.4 F (36.9 C)  TempSrc: Oral  SpO2: 97%  Weight: 160 lb 8 oz (72.8 kg)   GENERAL- alert, co-operative, appears as stated age, not in any distress. HEENT- Atraumatic, normocephalic CARDIAC- RRR, no murmurs, rubs or gallops. RESP- Moving equal volumes of air throughout all lung fields, and clear to auscultation bilaterally, no wheezes or crackles. CHEST - TTP over right 8th rib without associated swelling, ecchymosis, obvious trauma; no other painful sited noted. EXTREMITIES- pulse 2+, symmetric, no pedal edema. SKIN- Warm, dry, no rash or lesion. PSYCH- Normal mood and affect, appropriate thought content and speech.  Assessment & Plan:   See Encounters Tab for problem based charting.   Patient discussed with Dr. Moshe Cipro, MD Internal Medicine PGY2

## 2017-02-16 NOTE — Patient Instructions (Signed)
For your pain, start taking Naprosyn 500mg  (one tab), twice a day starting tonight. Start icing your rib to help with the swelling and pain.  We'll see you in a few days for your regular appointment to see how you are healing.

## 2017-02-17 ENCOUNTER — Other Ambulatory Visit: Payer: Self-pay | Admitting: Internal Medicine

## 2017-02-17 DIAGNOSIS — E1142 Type 2 diabetes mellitus with diabetic polyneuropathy: Secondary | ICD-10-CM

## 2017-02-17 NOTE — Telephone Encounter (Signed)
NEW MEDICATION CALLED IN TO WRONG PHARMACY, NEEDS TO GO TO [address].

## 2017-02-17 NOTE — Assessment & Plan Note (Signed)
Patient with right sided rib pain following trauma while cleaning. Her vital signs are stable and she is not dyspneic. Etiology is likely rib contusion or mild, non displaced fracture of 8th rib based on exam; she has great breath sounds throughout which is reassuring against lung trauma. There is no clear indication for rib imaging at this time based on her exam and findings.  Plan: --administer 15mg  ketorolac IM in office --prescribed naproxen 500mg  BID with meals --advised use of ice on area --she will f/u with her PCP in a couple of days --advised to rtc sooner if she becomes dyspneic, worsening pain

## 2017-02-17 NOTE — Telephone Encounter (Signed)
rtc to pt, updated her pharm list, only has [facility] now, called [facility]yn to [facility]

## 2017-02-18 NOTE — Addendum Note (Signed)
Addended by: Oda Kilts on: 02/18/2017 03:24 PM   Modules accepted: Level of Service

## 2017-02-18 NOTE — Progress Notes (Signed)
Case discussed with Dr. Jari Favre  at the time of the visit.  We reviewed the resident's history and exam and pertinent patient test results.  I agree with the assessment, diagnosis, and plan of care documented in the resident's note.  Given normal vital signs, reassuring exam, and no respiratory symptoms, no need for X-rays to evaluate for fracture as treatment would be symptom control in absence of flail chest or pulmonary contusion.  Oda Kilts, MD

## 2017-02-18 NOTE — Addendum Note (Signed)
Addended by: Oda Kilts on: 02/18/2017 03:31 PM   Modules accepted: Level of Service

## 2017-02-19 ENCOUNTER — Ambulatory Visit (INDEPENDENT_AMBULATORY_CARE_PROVIDER_SITE_OTHER): Payer: Medicaid Other | Admitting: Internal Medicine

## 2017-02-19 ENCOUNTER — Encounter: Payer: Self-pay | Admitting: Internal Medicine

## 2017-02-19 VITALS — BP 125/65 | HR 64 | Temp 98.1°F | Ht 67.0 in | Wt 161.8 lb

## 2017-02-19 DIAGNOSIS — E1142 Type 2 diabetes mellitus with diabetic polyneuropathy: Secondary | ICD-10-CM

## 2017-02-19 DIAGNOSIS — S20211A Contusion of right front wall of thorax, initial encounter: Secondary | ICD-10-CM | POA: Diagnosis not present

## 2017-02-19 DIAGNOSIS — Z9889 Other specified postprocedural states: Secondary | ICD-10-CM | POA: Diagnosis not present

## 2017-02-19 DIAGNOSIS — Z794 Long term (current) use of insulin: Secondary | ICD-10-CM

## 2017-02-19 DIAGNOSIS — Z23 Encounter for immunization: Secondary | ICD-10-CM

## 2017-02-19 DIAGNOSIS — Z1231 Encounter for screening mammogram for malignant neoplasm of breast: Secondary | ICD-10-CM

## 2017-02-19 DIAGNOSIS — X58XXXA Exposure to other specified factors, initial encounter: Secondary | ICD-10-CM

## 2017-02-19 LAB — GLUCOSE, CAPILLARY: Glucose-Capillary: 259 mg/dL — ABNORMAL HIGH (ref 65–99)

## 2017-02-19 MED ORDER — INSULIN GLARGINE 100 UNIT/ML SOLOSTAR PEN: 30.0000 [IU] | PEN_INJECTOR | Freq: Every day | SUBCUTANEOUS | 11 refills | Status: DC

## 2017-02-19 NOTE — Progress Notes (Signed)
Internal Medicine Clinic Attending  Case discussed with Dr. Amin at the time of the visit.  We reviewed the resident's history and exam and pertinent patient test results.  I agree with the assessment, diagnosis, and plan of care documented in the resident's note.    

## 2017-02-19 NOTE — Assessment & Plan Note (Addendum)
She did bring her glucometer meter with her today, there was total testing of 7 over past 1 month. Average glucose was 219 with highest at 293 and lowest at 167. Her Byetta was increased to 10 mg twice daily during previous office visit, she was unable to tolerate that increase in her dose, as it was making her more nauseated, so she decreased her dose to 5 mg twice daily, which she was tolerating very well. She also increased her Lantus from 25-28 unit at bedtime. She was very reluctant to make any changes in her medication at this time, stating that she wants to control her diabetes with lifestyle modifications which include diet and exercise. She had an upcoming appointment with Butch Penny early next  Month. She agreed to make a very small increase  in her Lantus from 28-30.   Increase Lantus to 30 units at bedtime  -Continue Byetta with 5 mg twice daily. -Continue metformin 1000 mg twice daily.

## 2017-02-19 NOTE — Progress Notes (Signed)
   CC: For follow-up of her diabetes.  HPI:  [name] is a 61 y.o. with past medical history as listed below came to the clinic for follow-up of her diabetes.  She was recently seen in clinic because of right rib contusion, stating her pain is slowly improving, continue to get pain with deep breathing and change in posture. Denies any shortness of breath or cough.  She had her bilateral cataract surgery done recently, right eye was done on 02/15/2017 and left was done on 02/01/2017. She has no complaints regarding that.  Please see problem based assessment and plan for her chronic conditions.  Past Medical History:  Diagnosis Date  . Brain aneurysm 03/19/13  . Colon polyps   . Diabetes ([place]) 2013  . High blood pressure   . Kidney stones   . Migraines   . Thyroid disease    Review of Systems:  Negative except mentioned in history of present illness.  Physical Exam:  Vitals:   02/19/17 1326  BP: 125/65  Pulse: 64  Temp: 98.1 F (36.7 C)  TempSrc: Oral  SpO2: 100%  Weight: 161 lb 12.8 oz (73.4 kg)  Height: 5\' 7"  (1.702 m)    General: Vital signs reviewed.  Patient is well-developed and well-nourished, in no acute distress and cooperative with exam.  Head: Normocephalic and atraumatic. Eyes: EOMI, conjunctivae normal, no scleral icterus.  Cardiovascular: RRR, S1 normal, S2 normal, no murmurs, gallops, or rubs. Pulmonary/Chest: Clear to auscultation bilaterally, no wheezes, rales, or rhonchi. Abdominal: Soft, non-tender, non-distended, BS +, no masses, organomegaly, or guarding present.  Musculoskeletal: Tenderness along seventh and eighth right lateral ribs, no edema, erythema or bruising. Extremities: No lower extremity edema bilaterally,  pulses symmetric and intact bilaterally. No cyanosis or clubbing. Skin: Warm, dry and intact. No rashes or erythema. Psychiatric: Normal mood and affect. speech and behavior is normal. Cognition and memory are  normal.  Assessment & Plan:   See Encounters Tab for problem based charting.  Patient discussed with Dr. Evette Doffing.

## 2017-02-19 NOTE — Patient Instructions (Signed)
Thank you for visiting clinic today. As we discussed please increased her Lantus to 30 units daily. You can continue byetta at 5 units twice daily. Please try to check your blood sugar at least twice daily before meals and bring your glucometer meter with your next appointment. Continue taking naproxen as needed for your rib pain, these type of injuries normally takes couple of weeks to heal. Please follow-up in 2 month.

## 2017-02-19 NOTE — Assessment & Plan Note (Signed)
No obvious sign of injury. Pain slowly getting better.  -Continue with naproxen as needed for pain. -Avoid activities which make her pain worse. -She was advised to seek medical attention if develops dyspnea or worsening of her pain.

## 2017-02-24 ENCOUNTER — Other Ambulatory Visit: Payer: Self-pay | Admitting: Internal Medicine

## 2017-02-24 DIAGNOSIS — Z1231 Encounter for screening mammogram for malignant neoplasm of breast: Secondary | ICD-10-CM

## 2017-03-02 ENCOUNTER — Ambulatory Visit (INDEPENDENT_AMBULATORY_CARE_PROVIDER_SITE_OTHER): Payer: Medicaid Other | Admitting: Dietician

## 2017-03-02 ENCOUNTER — Encounter: Payer: Self-pay | Admitting: Dietician

## 2017-03-02 VITALS — Wt 163.5 lb

## 2017-03-02 DIAGNOSIS — Z713 Dietary counseling and surveillance: Secondary | ICD-10-CM

## 2017-03-02 DIAGNOSIS — E1165 Type 2 diabetes mellitus with hyperglycemia: Secondary | ICD-10-CM | POA: Diagnosis not present

## 2017-03-02 DIAGNOSIS — E1142 Type 2 diabetes mellitus with diabetic polyneuropathy: Secondary | ICD-10-CM

## 2017-03-02 LAB — GLUCOSE, CAPILLARY: Glucose-Capillary: 309 mg/dL — ABNORMAL HIGH (ref 65–99)

## 2017-03-02 MED ORDER — PEN NEEDLES 31G X 5 MM MISC: 1.0000 | Freq: Four times a day (QID) | 3 refills | Status: DC

## 2017-03-02 MED ORDER — INSULIN ASPART 100 UNIT/ML FLEXPEN: PEN_INJECTOR | SUBCUTANEOUS | 11 refills | Status: DC

## 2017-03-02 NOTE — Patient Instructions (Addendum)
Start injecting 3 units Novolog rapid acting insulin 15 minutes before meals three meals a day.  If you skip a meal, skip that dose of Novolog.  Continue taking the Lantus 30 units one time a day and Byetta 5 mg two times a day  Check blood sugars 2-4 times a day and brgin meter back to an appointment in 1 week.  If your blood sugar goes below 70 , please call the office.   Hypoglycemia Hypoglycemia is when the sugar (glucose) level in the blood is too low. Symptoms of low blood sugar may include:  Feeling: ? Hungry. ? Worried or nervous (anxious). ? Sweaty and clammy. ? Confused. ? Dizzy. ? Sleepy. ? Sick to your stomach (nauseous).  Having: ? A fast heartbeat. ? A headache. ? A change in your vision. ? Jerky movements that you cannot control (seizure). ? Nightmares. ? Tingling or no feeling (numbness) around the mouth, lips, or tongue.  Having trouble with: ? Talking. ? Paying attention (concentrating). ? Moving (coordination). ? Sleeping.  Shaking.  Passing out (fainting).  Getting upset easily (irritability).  Low blood sugar can happen to people who have diabetes and people who do not have diabetes. Low blood sugar can happen quickly, and it can be an emergency. Treating Low Blood Sugar Low blood sugar is often treated by eating or drinking something sugary right away. If you can think clearly and swallow safely, follow the 15:15 rule:  Take 15 grams of a fast-acting carb (carbohydrate). Some fast-acting carbs are: ? 1 tube of glucose gel. ? 3 sugar tablets (glucose pills). ? 6-8 pieces of hard candy. ? 4 oz (120 mL) of fruit juice. ? 4 oz (120 mL) of regular (not diet) soda.  Check your blood sugar 15 minutes after you take the carb.  If your blood sugar is still at or below 70 mg/dL (3.9 mmol/L), take 15 grams of a carb again.  If your blood sugar does not go above 70 mg/dL (3.9 mmol/L) after 3 tries, get help right away.  After your blood sugar goes  back to normal, eat a meal or a snack within 1 hour.  Treating Very Low Blood Sugar If your blood sugar is at or below 54 mg/dL (3 mmol/L), you have very low blood sugar (severe hypoglycemia). This is an emergency. Do not wait to see if the symptoms will go away. Get medical help right away. Call your local emergency services (911 in the [place] Do not drive yourself to the hospital. If you have very low blood sugar and you cannot eat or drink, you may need a glucagon shot (injection). A family member or friend should learn how to check your blood sugar and how to give you a glucagon shot. Ask your doctor if you need to have a glucagon shot kit at home. Follow these instructions at home: General instructions  Avoid any diets that cause you to not eat enough food. Talk with your doctor before you start any new diet.  Take over-the-counter and prescription medicines only as told by your doctor.  Limit alcohol to no more than 1 drink per day for nonpregnant women and 2 drinks per day for men. One drink equals 12 oz of beer, 5 oz of wine, or 1 oz of hard liquor.  Keep all follow-up visits as told by your doctor. This is important. If You Have Diabetes:   Make sure you know the symptoms of low blood sugar.  Always keep a source of  sugar with you, such as: ? Sugar. ? Sugar tablets. ? Glucose gel. ? Fruit juice. ? Regular soda (not diet soda). ? Milk. ? Hard candy. ? Honey.  Take your medicines as told.  Follow your exercise and meal plan. ? Eat on time. Do not skip meals. ? Follow your sick day plan when you cannot eat or drink normally. Make this plan ahead of time with your doctor.  Check your blood sugar as often as told by your doctor. Always check before and after exercise.  Share your diabetes care plan with: ? Your work or school. ? People you live with.  Check your pee (urine) for ketones: ? When you are sick. ? As told by your doctor.  Carry a card or wear jewelry  that says you have diabetes. If You Have Low Blood Sugar From Other Causes:   Check your blood sugar as often as told by your doctor.  Follow instructions from your doctor about what you cannot eat or drink. Contact a doctor if:  You have trouble keeping your blood sugar in your target range.  You have low blood sugar often. Get help right away if:  You still have symptoms after you eat or drink something sugary.  Your blood sugar is at or below 54 mg/dL (3 mmol/L).  You have jerky movements that you cannot control.  You pass out. These symptoms may be an emergency. Do not wait to see if the symptoms will go away. Get medical help right away. Call your local emergency services (911 in the [place] Do not drive yourself to the hospital. This information is not intended to replace advice given to you by your health care provider. Make sure you discuss any questions you have with your health care provider. Document Released: 08/05/2009 Document Revised: 10/17/2015 Document Reviewed: 06/14/2015 Elsevier Interactive Patient Education  2018 [place] Less... 1. Saturated & Transfats- mostly from red meat, high fat dairy like milk, ice cream, coffee creamer, snack foods like chips, pork rind, fried foods  Avoid.... . High-fructose corn syrup and "other sugars" . Partially hydrogenated vegetable oils . Trans fatty acids . Nondairy coffee creamer Of course, too much of almost any food.  SLEEP is important 7-9 hours each night as best  You Lucianne Lei- getting a good routine is important.

## 2017-03-02 NOTE — Progress Notes (Signed)
Diabetes Self-Management Education  Visit Type:  Follow-up  Appt. Start Time: 815 Appt. End Time: 925  03/02/2017  [name], identified by name and date of birth, is a 61 y.o. female with a diagnosis of Diabetes:  .   ASSESSMENT   Discussed patient care with Dr. Joni Gentry. Patient did not tolerate metformin or 10 mg Byetta, she is frustrated that her blood sugars sre so high and would like to start meal time insulin.  She is not having any trouble affording her medicine or testing supplies. She does not want to gain weight. Her diet is mostly vegetables, fruits and lean protein. Very few starchy vegetables  Weight 163 lb 8 oz (74.2 kg). Body mass index is 25.61 kg/m.       Diabetes Self-Management Education - 03/02/17 0900      Health Coping   How would you rate your overall health? Good     Psychosocial Assessment   Patient Belief/Attitude about Diabetes Other (comment)  she is frustrated that her sugars are so high   Self-care barriers Lack of material resources   Self-management support Doctor's office;Family;CDE visits  Her sister&mother both eat healthy, lower carb   Patient Concerns Nutrition/Meal planning;Medication;Monitoring;Healthy Lifestyle;Problem Solving;Glycemic Control;Weight Control;Support   Special Needs None   Preferred Learning Style No preference indicated   Learning Readiness Change in progress     Complications   Last HgB A1C per patient/outside source 14 %  reports 17% prior to cataract surgery   How often do you check your blood sugar? 3-4 times/day   Fasting Blood glucose range (mg/dL) 180-200  lowest has been 130 one time when she did not eat all day   Postprandial Blood glucose range (mg/dL) >200   Number of hypoglycemic episodes per month 0   Number of hyperglycemic episodes per week 100   Can you tell when your blood sugar is high? Yes   What do you do if your blood sugar is high? eats less, drinks water, exercise   Have you had  a dilated eye exam in the past 12 months? Yes   Have you had a dental exam in the past 12 months? Yes   Are you checking your feet? Yes   How many days per week are you checking your feet? 7     Dietary Intake   Breakfast 8am-banana, shakes or skips   Lunch 1030am- cheeseburger, fruit, carrots,    Snack (afternoon) fruit   Dinner 830-9pm broccoli casserole, lean chicken or fish or hamburgr   Snack (evening) sometimes peanut butter crackers x3   Beverage(s) water, 1/2&1/2 tea     Exercise   Exercise Type ADL's;Light (walking / raking leaves)  caring for mother&man in wheelchair right now, joining gym   How many days per week to you exercise? 7  7   How many minutes per day do you exercise? 30   Total minutes per week of exercise 210     Patient Education   Disease state  Definition of diabetes, type 1 and 2, and the diagnosis of diabetes   Nutrition management  Meal timing in regards to the patients' current diabetes medication.;Meal options for control of blood glucose level and chronic complications.   Medications Taught/reviewed insulin injection, site rotation, insulin storage and needle disposal.;Reviewed patients medication for diabetes, action, purpose, timing of dose and side effects.   Monitoring Purpose and frequency of SMBG.   Acute complications Taught treatment of hypoglycemia - the 15 rule.  Psychosocial adjustment Worked with patient to identify barriers to care and solutions;Role of stress on diabetes;Identified and addressed patients feelings and concerns about diabetes   Personal strategies to promote health Helped patient develop diabetes management plan for (enter comment)  improving blood sugars     Individualized Goals (developed by patient)   Monitoring  test my blood glucose as discussed     Patient Self-Evaluation of Goals - Patient rates self as meeting previously set goals (% of time)   Monitoring 25 - 50%     Outcomes   Program Status Not Completed      Subsequent Visit   Since your last visit have you continued or begun to take your medications as prescribed? Yes   Since your last visit have you had your blood pressure checked? No   Since your last visit have you experienced any weight changes? No change   Since your last visit, are you checking your blood glucose at least once a day? Yes      Learning Objective:  Patient will have a greater understanding of diabetes self-management. Patient education plan is to attend individual and/or group sessions per assessed needs and concerns.   Plan:   Patient Instructions   Start injecting 3 units Novolog rapid acting insulin 15 minutes before meals three meals a day.  If you skip a meal, skip that dose of Novolog.  Continue taking the Lantus 30 units one time a day and Byetta 5 mg two times a day  Check blood sugars 2-4 times a day and brgin meter back to an appointment in 1 week.  If your blood sugar goes below 70 , please call the office.   Hypoglycemia Hypoglycemia is when the sugar (glucose) level in the blood is too low. Symptoms of low blood sugar may include:  Feeling: ? Hungry. ? Worried or nervous (anxious). ? Sweaty and clammy. ? Confused. ? Dizzy. ? Sleepy. ? Sick to your stomach (nauseous).  Having: ? A fast heartbeat. ? A headache. ? A change in your vision. ? Jerky movements that you cannot control (seizure). ? Nightmares. ? Tingling or no feeling (numbness) around the mouth, lips, or tongue.  Having trouble with: ? Talking. ? Paying attention (concentrating). ? Moving (coordination). ? Sleeping.  Shaking.  Passing out (fainting).  Getting upset easily (irritability).  Low blood sugar can happen to people who have diabetes and people who do not have diabetes. Low blood sugar can happen quickly, and it can be an emergency. Treating Low Blood Sugar Low blood sugar is often treated by eating or drinking something sugary right away. If you can think  clearly and swallow safely, follow the 15:15 rule:  Take 15 grams of a fast-acting carb (carbohydrate). Some fast-acting carbs are: ? 1 tube of glucose gel. ? 3 sugar tablets (glucose pills). ? 6-8 pieces of hard candy. ? 4 oz (120 mL) of fruit juice. ? 4 oz (120 mL) of regular (not diet) soda.  Check your blood sugar 15 minutes after you take the carb.  If your blood sugar is still at or below 70 mg/dL (3.9 mmol/L), take 15 grams of a carb again.  If your blood sugar does not go above 70 mg/dL (3.9 mmol/L) after 3 tries, get help right away.  After your blood sugar goes back to normal, eat a meal or a snack within 1 hour.  Treating Very Low Blood Sugar If your blood sugar is at or below 54 mg/dL (3 mmol/L), you  have very low blood sugar (severe hypoglycemia). This is an emergency. Do not wait to see if the symptoms will go away. Get medical help right away. Call your local emergency services (911 in the [place] Do not drive yourself to the hospital. If you have very low blood sugar and you cannot eat or drink, you may need a glucagon shot (injection). A family member or friend should learn how to check your blood sugar and how to give you a glucagon shot. Ask your doctor if you need to have a glucagon shot kit at home. Follow these instructions at home: General instructions  Avoid any diets that cause you to not eat enough food. Talk with your doctor before you start any new diet.  Take over-the-counter and prescription medicines only as told by your doctor.  Limit alcohol to no more than 1 drink per day for nonpregnant women and 2 drinks per day for men. One drink equals 12 oz of beer, 5 oz of wine, or 1 oz of hard liquor.  Keep all follow-up visits as told by your doctor. This is important. If You Have Diabetes:   Make sure you know the symptoms of low blood sugar.  Always keep a source of sugar with you, such as: ? Sugar. ? Sugar tablets. ? Glucose gel. ? Fruit  juice. ? Regular soda (not diet soda). ? Milk. ? Hard candy. ? Honey.  Take your medicines as told.  Follow your exercise and meal plan. ? Eat on time. Do not skip meals. ? Follow your sick day plan when you cannot eat or drink normally. Make this plan ahead of time with your doctor.  Check your blood sugar as often as told by your doctor. Always check before and after exercise.  Share your diabetes care plan with: ? Your work or school. ? People you live with.  Check your pee (urine) for ketones: ? When you are sick. ? As told by your doctor.  Carry a card or wear jewelry that says you have diabetes. If You Have Low Blood Sugar From Other Causes:   Check your blood sugar as often as told by your doctor.  Follow instructions from your doctor about what you cannot eat or drink. Contact a doctor if:  You have trouble keeping your blood sugar in your target range.  You have low blood sugar often. Get help right away if:  You still have symptoms after you eat or drink something sugary.  Your blood sugar is at or below 54 mg/dL (3 mmol/L).  You have jerky movements that you cannot control.  You pass out. These symptoms may be an emergency. Do not wait to see if the symptoms will go away. Get medical help right away. Call your local emergency services (911 in the [place] Do not drive yourself to the hospital. This information is not intended to replace advice given to you by your health care provider. Make sure you discuss any questions you have with your health care provider. Document Released: 08/05/2009 Document Revised: 10/17/2015 Document Reviewed: 06/14/2015 Elsevier Interactive Patient Education  2018 [place] Less... 1. Saturated & Transfats- mostly from red meat, high fat dairy like milk, ice cream, coffee creamer, snack foods like chips, pork rind, fried foods  Avoid.... . High-fructose corn syrup and "other sugars" . Partially hydrogenated  vegetable oils . Trans fatty acids . Nondairy coffee creamer Of course, too much of almost any food.  SLEEP is important 7-9  hours each night as best  You Lucianne Lei- getting a good routine is important.     Expected Outcomes:  Demonstrated interest in learning. Expect positive outcomes  Education material provided: Support group flyer  If problems or questions, patient to contact team via:  Phone  Future DSME appointment: - Other (comment) (1 week)  Plyler, Butch Penny, Chauncey 03/02/2017 10:07 AM.

## 2017-03-10 ENCOUNTER — Ambulatory Visit: Payer: Medicaid Other | Admitting: Dietician

## 2017-03-11 ENCOUNTER — Ambulatory Visit
Admission: RE | Admit: 2017-03-11 | Discharge: 2017-03-11 | Disposition: A | Discharge status: Home / Self Care | Payer: Medicaid Other | Source: Ambulatory Visit | Attending: Student in an Organized Health Care Education/Training Program | Admitting: Student in an Organized Health Care Education/Training Program

## 2017-03-11 DIAGNOSIS — Z1231 Encounter for screening mammogram for malignant neoplasm of breast: Secondary | ICD-10-CM

## 2017-04-23 ENCOUNTER — Encounter: Payer: Medicaid Other | Admitting: Internal Medicine

## 2017-06-04 ENCOUNTER — Other Ambulatory Visit: Payer: Self-pay

## 2017-06-04 ENCOUNTER — Ambulatory Visit: Payer: BLUE CROSS/BLUE SHIELD | Admitting: Internal Medicine

## 2017-06-04 ENCOUNTER — Encounter: Payer: Self-pay | Admitting: Internal Medicine

## 2017-06-04 ENCOUNTER — Ambulatory Visit (INDEPENDENT_AMBULATORY_CARE_PROVIDER_SITE_OTHER): Payer: BLUE CROSS/BLUE SHIELD | Admitting: Dietician

## 2017-06-04 VITALS — BP 110/56 | HR 67 | Temp 98.0°F | Ht 67.0 in | Wt 152.6 lb

## 2017-06-04 DIAGNOSIS — J309 Allergic rhinitis, unspecified: Secondary | ICD-10-CM | POA: Diagnosis not present

## 2017-06-04 DIAGNOSIS — Z87891 Personal history of nicotine dependence: Secondary | ICD-10-CM | POA: Diagnosis not present

## 2017-06-04 DIAGNOSIS — Z794 Long term (current) use of insulin: Secondary | ICD-10-CM

## 2017-06-04 DIAGNOSIS — J069 Acute upper respiratory infection, unspecified: Secondary | ICD-10-CM

## 2017-06-04 DIAGNOSIS — E119 Type 2 diabetes mellitus without complications: Secondary | ICD-10-CM

## 2017-06-04 DIAGNOSIS — E1142 Type 2 diabetes mellitus with diabetic polyneuropathy: Secondary | ICD-10-CM

## 2017-06-04 LAB — GLUCOSE, CAPILLARY: Glucose-Capillary: 432 mg/dL — ABNORMAL HIGH (ref 65–99)

## 2017-06-04 LAB — POCT GLYCOSYLATED HEMOGLOBIN (HGB A1C): Hemoglobin A1C: >14

## 2017-06-04 MED ORDER — CANAGLIFLOZIN-METFORMIN HCL ER 50-1000 MG PO TB24: 2.0000 | ORAL_TABLET | Freq: Every day | ORAL | 2 refills | Status: DC

## 2017-06-04 MED ORDER — ACCU-CHEK AVIVA PLUS W/DEVICE KIT: PACK | 0 refills | Status: DC

## 2017-06-04 MED ORDER — INSULIN GLARGINE 100 UNIT/ML SOLOSTAR PEN: 40.0000 [IU] | PEN_INJECTOR | Freq: Every day | SUBCUTANEOUS | 11 refills | Status: DC

## 2017-06-04 NOTE — Patient Instructions (Addendum)
It was a pleasure to see you [name].  It sounds like you have a viral upper respiratory infection. Try the following: - Over the counter allergy medicine like Zyrtec or Claritin - Flonase nasal spray - Netipot for sinus irrigation - Stay hydrated with plenty of water  For your diabetes: - Increase Lantus to 40 units every night - Continue Byetta 10 mcg twice a day with meals - Start Canagliflozin-Metformin 50-1000 mg two tablets with breakfast daily   Please follow up with [place] in about 2 weeks for a recheck. Bring your meter with you.

## 2017-06-04 NOTE — Progress Notes (Signed)
   CC: diabetes  HPI:  Laura Gentry is a 62 y.o. female with PMH as listed below including T2DM who presents for follow up management of her diabetes.  Type 2 diabetes mellitus with peripheral neuropathy (HCC) Laura Gentry returns for follow up management of her T2DM. Her last A1c was >14.0 in August 2018. She says she has been taking Lantus 30 units qhs and Byetta 10 mcg BID. She says she stopped the Metformin 1000 mg BID due to pill burden. She was started on Novolog 3 units TID with meals but stopped this as she says it caused her to have bumps in her arms (not at injection site). She did not bring her meter or medications this visit.  She checks her blood sugar three times a day before meals and sees most readings in the 300s with occasional values in the 200s. She denies any hyperglycemic or hypoglycemic symptoms. She says she has issues with skipping meals but otherwise avoids sugary drinks, "white" foods like breads and rice, and eats salads. She says her sister had gastric bypass surgery and she is following the same diet as her. She says she likes fruits and vegetables.  A/P: Repeat A1c is >14.0 and CBG is 432. She is asymptomatic. Her diabetes remains uncontrolled. We will make the following changes: - Increase Lantus to 40 units qhs - Continue Byetta 10 mcg BID - Start Canagliflozin-Metformin ER 50-1000 mg two tablets once daily (total [id number] mg) - f/u in 2 weeks with meter  Viral URI She reports almost 2 weeks of cold symptoms including chest and sinus congestion, rhinorrhea, occasional hoarseness in voice. She had a cough which was initially productive of yellow sputum then clear. Her cough has now improved. She has used Mucinex with relief. A/P: Viral URI with allergic rhinitis type symptoms.  - Try Claritin or Zyrtec - Flonase - Sinus irrigation - mucinex if needed - supportive care    Past Medical History:  Diagnosis Date  . Brain aneurysm 03/19/13  . Colon  polyps   . Diabetes ([place]) 2013  . High blood pressure   . Kidney stones   . Migraines   . Thyroid disease    Review of Systems:   Review of Systems  Constitutional: Negative for chills and fever.  HENT: Positive for congestion and sinus pain.   Respiratory: Positive for cough. Negative for sputum production and shortness of breath.   Cardiovascular: Negative for chest pain.  Genitourinary: Negative for frequency.  Neurological: Negative for tingling and sensory change.  Endo/Heme/Allergies:       Occasional increased thirst     Physical Exam:  Vitals:   06/04/17 1423  BP: (!) 110/56  Pulse: 67  Temp: 98 F (36.7 C)  TempSrc: Oral  SpO2: 98%  Weight: 152 lb 9.6 oz (69.2 kg)  Height: 5\' 7"  (1.702 m)   Physical Exam  Constitutional: She is oriented to person, place, and time. She appears well-developed and well-nourished.  HENT:  Head: Normocephalic and atraumatic.  Mouth/Throat: Oropharynx is clear and moist. No oropharyngeal exudate.  Cardiovascular: Normal rate and regular rhythm.  No murmur heard. Pulmonary/Chest: Effort normal. No respiratory distress. She has no wheezes. She has no rales.  Neurological: She is alert and oriented to person, place, and time.  Skin: Skin is warm.    Assessment & Plan:   See Encounters Tab for problem based charting.  Patient discussed with Dr. Angelia Mould

## 2017-06-04 NOTE — Assessment & Plan Note (Signed)
She reports almost 2 weeks of cold symptoms including chest and sinus congestion, rhinorrhea, occasional hoarseness in voice. She had a cough which was initially productive of yellow sputum then clear. Her cough has now improved. She has used Mucinex with relief. A/P: Viral URI with allergic rhinitis type symptoms.  - Try Claritin or Zyrtec - Flonase - Sinus irrigation - mucinex if needed - supportive care

## 2017-06-04 NOTE — Patient Instructions (Addendum)
Laura Gentry,  I am sorry you are so frustrated by your diabetes control. Hang in there. We'll work together to figure it out.   See if you can find your meter.   I can send in a new prescription to your drug store that you can get if you cannot find it OR you can call me to get a new one. Take the coupon with you.  Call your insurance to find out how they cover the [place] codes. This could help you and [place] have more information about how your medicine and food choices are affecting your blood sugars.   Call with questions or concerns.  Butch Penny [phone]

## 2017-06-04 NOTE — Assessment & Plan Note (Signed)
[name] returns for follow up management of her T2DM. Her last A1c was >14.0 in August 2018. She says she has been taking Lantus 30 units qhs and Byetta 10 mcg BID. She says she stopped the Metformin 1000 mg BID due to pill burden. She was started on Novolog 3 units TID with meals but stopped this as she says it caused her to have bumps in her arms (not at injection site). She did not bring her meter or medications this visit.  She checks her blood sugar three times a day before meals and sees most readings in the 300s with occasional values in the 200s. She denies any hyperglycemic or hypoglycemic symptoms. She says she has issues with skipping meals but otherwise avoids sugary drinks, "white" foods like breads and rice, and eats salads. She says her sister had gastric bypass surgery and she is following the same diet as her. She says she likes fruits and vegetables.  A/P: Repeat A1c is >14.0 and CBG is 432. She is asymptomatic. Her diabetes remains uncontrolled. We will make the following changes: - Increase Lantus to 40 units qhs - Continue Byetta 10 mcg BID - Start Canagliflozin-Metformin ER 50-1000 mg two tablets once daily (total [phone] mg) - f/u in 2 weeks with meter

## 2017-06-04 NOTE — Addendum Note (Signed)
Addended by: Resa Miner on: 06/04/2017 05:40 PM   Modules accepted: Orders

## 2017-06-04 NOTE — Progress Notes (Signed)
Diabetes Self-Management Education  Visit Type:  (P) Follow-up  Appt. Start Time: 815 Appt. End Time: 925  06/04/2017  Laura Gentry, identified by name and date of birth, is a 62 y.o. female with a diagnosis of Diabetes:  .   ASSESSMENT    Patient reports memory problems today. She lost her meter a few days ago, did not tolerate Novolog insulin( arm that she injected broke out into red rash while on vacation) , she is frustrated that her blood sugars are so high, despite her best efforts to take her medication, eat healthy etc.   She is not having any trouble affording her medicine or testing supplies. She does not want to gain weight. Her diet is mostly vegetables, fruits and lean protein. Very few starchy vegetables  A1C 14% today Weight-152.6# BMI. 23-9- within a healthy range   Diabetes Self-Management Education - 16/01/09 3235      Complications   Last HgB A1C per patient/outside source  14 %  (Pended)     How often do you check your blood sugar?  3-4 times/day  (Pended)     Fasting Blood glucose range (mg/dL)  >200  (Pended)     Postprandial Blood glucose range (mg/dL)  >200  (Pended)     Number of hypoglycemic episodes per month  0  (Pended)     Number of hyperglycemic episodes per week  100  (Pended)     Can you tell when your blood sugar is high?  Yes  (Pended)     What do you do if your blood sugar is high?  takes medicine  (Pended)     Have you had a dilated eye exam in the past 12 months?  Yes  (Pended)     Are you checking your feet?  Yes  (Pended)       Individualized Goals (developed by patient)   Monitoring   test my blood glucose as discussed  (Pended)       Patient Self-Evaluation of Goals - Patient rates self as meeting previously set goals (% of time)   Monitoring  >75%  (Pended)       Outcomes   Program Status  Completed  (Pended)      Learning Objective:  Patient will have a greater understanding of diabetes self-management. Patient education plan is  to attend individual and/or group sessions per assessed needs and concerns.   Plan:   Patient Instructions  Laura Gentry,  I am sorry you are so frustrated by your diabetes control. Hang in there. We'll work together to figure it out.   See if you can find your meter.   I can send in a new prescription to your drug store that you can get if you cannot find it OR you can call me to get a new one. Take the coupon with you.  Call your insurance to find out how they cover the [place] codes. This could help you and [place] have more information about how your medicine and food choices are affecting your blood sugars.   Call with questions or concerns.  Butch Penny [phone]   Expected Outcomes:  (P) Demonstrated interest in learning. Expect positive outcomes  Education material provided: CGM information flyer  If problems or questions, patient to contact team via:  Phone  Future DSME appointment: - (P) 3-4 months  Debera Lat, RD 06/04/2017 5:48 PM.

## 2017-06-07 NOTE — Progress Notes (Signed)
Internal Medicine Clinic Attending  Case discussed with Dr. Patel at the time of the visit.  We reviewed the resident's history and exam and pertinent patient test results.  I agree with the assessment, diagnosis, and plan of care documented in the resident's note.  

## 2017-06-16 ENCOUNTER — Encounter: Payer: BLUE CROSS/BLUE SHIELD | Admitting: Internal Medicine

## 2017-07-09 ENCOUNTER — Ambulatory Visit (INDEPENDENT_AMBULATORY_CARE_PROVIDER_SITE_OTHER): Payer: BLUE CROSS/BLUE SHIELD | Admitting: Dietician

## 2017-07-09 ENCOUNTER — Other Ambulatory Visit: Payer: Self-pay

## 2017-07-09 ENCOUNTER — Ambulatory Visit: Payer: BLUE CROSS/BLUE SHIELD | Admitting: Internal Medicine

## 2017-07-09 ENCOUNTER — Encounter: Payer: Self-pay | Admitting: Dietician

## 2017-07-09 ENCOUNTER — Encounter: Payer: Self-pay | Admitting: Internal Medicine

## 2017-07-09 DIAGNOSIS — G479 Sleep disorder, unspecified: Secondary | ICD-10-CM

## 2017-07-09 DIAGNOSIS — E1142 Type 2 diabetes mellitus with diabetic polyneuropathy: Secondary | ICD-10-CM

## 2017-07-09 DIAGNOSIS — Z79899 Other long term (current) drug therapy: Secondary | ICD-10-CM | POA: Diagnosis not present

## 2017-07-09 LAB — GLUCOSE, CAPILLARY: Glucose-Capillary: 131 mg/dL — ABNORMAL HIGH (ref 65–99)

## 2017-07-09 NOTE — Patient Instructions (Addendum)
Thank you for visiting clinic today. As we discussed we are giving you a continuous glucose monitor, you have to follow-up in 1 week to download the data. I am glad you are trying to control your blood sugar, your blood sugar in clinic looks very good today, please keep up the good work. Please continue to exercise and watch for your diet along with your medications.

## 2017-07-09 NOTE — Progress Notes (Signed)
Freestyle Libre Pro CGM sensor placed and started. Patient was educated about wearing sensor, keeping food, activity and medication log and when to call office. Follow up was arranged with the patient.  Debera Lat, RD, LDN, Certified Diabetes Educator(CDE) 07/09/2017 4:27 PM.

## 2017-07-09 NOTE — Progress Notes (Signed)
   CC: For follow-up of her diabetes.  HPI:  [name] is a 62 y.o. with past medical history as listed below came to the clinic for follow-up of her diabetes.  She was complaining of excessive daytime sleepiness occasionally, mostly on the days when she is feeling little down, occasionally she has disturbed sleep during the night which resulted in not feeling refreshed in the morning and mild morning headaches.  She denies any excessive snoring or apneic spells.  She never had any sleep study.  She normally goes to bed around 11 PM and wake up between 7:53 AM next day.  Her PHQ 9 score was total 7.  Please see assessment and plan for her chronic condition.  Past Medical History:  Diagnosis Date  . Brain aneurysm 03/19/13  . Colon polyps   . Diabetes ([place]) 2013  . High blood pressure   . Kidney stones   . Migraines   . Thyroid disease    Review of Systems: Except mentioned in HPI.  Physical Exam:  Vitals:   07/09/17 1430  BP: 111/61  Pulse: 62  Temp: 98 F (36.7 C)  TempSrc: Oral  SpO2: 98%  Weight: 157 lb 12.8 oz (71.6 kg)  Height: 5\' 7"  (1.702 m)   General: Vital signs reviewed.  Patient is well-developed and well-nourished, in no acute distress and cooperative with exam.  Head: Normocephalic and atraumatic. Eyes: EOMI, conjunctivae normal, no scleral icterus.  Cardiovascular: RRR, S1 normal, S2 normal, no murmurs, gallops, or rubs. Pulmonary/Chest: Clear to auscultation bilaterally, no wheezes, rales, or rhonchi. Abdominal: Soft, non-tender, non-distended, BS +, no masses, organomegaly, or guarding present.  Musculoskeletal: No joint deformities, erythema, or stiffness, ROM full and nontender. Extremities: No lower extremity edema bilaterally,  pulses symmetric and intact bilaterally. No cyanosis or clubbing. Skin: Warm, dry and intact. No rashes or erythema. Psychiatric: Normal mood and affect. speech and behavior is normal. Cognition and memory are  normal.  Assessment & Plan:   See Encounters Tab for problem based charting.  Patient discussed with Dr. Daryll Drown.

## 2017-07-10 NOTE — Assessment & Plan Note (Signed)
Very nonspecific complaints of being excessive sleepiness during the days and occasionally unable to sleep well at night.  Intermittently she do feel down but current PHQ 9 score was 7. She was on Lamictal, Zoloft and topiramate by her physician at [facility]. She denies any signs and symptoms of obstructive sleep apnea.  I told her to discuss these issues with her psychiatrist and adjust her medications accordingly.  If her symptoms get worse or fail to resolve with those adjustments we can think about getting a sleep study in the future.

## 2017-07-10 NOTE — Assessment & Plan Note (Addendum)
She never brought her glucometer again during this appointment.  Her A1c in January 2019 was more than 14 and her antidiabetic dosage was increased.  Her CBG in the clinic was 131-much improved than her previous one.  According to patient whenever she checked at home her blood sugar remained between 113-140 with these new adjustments in her medication. She recently joined a gym and started regular low impact aerobics.  She is also working on a diet plan for weight loss.  If she will continue watching her diet and exercise that will help bringing her A1c down. She was given a CGM today for better monitoring and adjustment in her antidiabetic regimen. -Continue current regimen as there is no episode of hypoglycemia. -Follow-up in 1 week for CGM reading.

## 2017-07-13 NOTE — Progress Notes (Signed)
Internal Medicine Clinic Attending  Case discussed with Dr. Amin at the time of the visit.  We reviewed the resident's history and exam and pertinent patient test results.  I agree with the assessment, diagnosis, and plan of care documented in the resident's note.    

## 2017-07-15 ENCOUNTER — Other Ambulatory Visit: Payer: Self-pay | Admitting: Dietician

## 2017-07-15 ENCOUNTER — Ambulatory Visit: Payer: BLUE CROSS/BLUE SHIELD | Admitting: Internal Medicine

## 2017-07-15 ENCOUNTER — Encounter: Payer: Self-pay | Admitting: Dietician

## 2017-07-15 ENCOUNTER — Ambulatory Visit: Payer: BLUE CROSS/BLUE SHIELD | Admitting: Dietician

## 2017-07-15 DIAGNOSIS — Z6824 Body mass index (BMI) 24.0-24.9, adult: Secondary | ICD-10-CM

## 2017-07-15 DIAGNOSIS — Z713 Dietary counseling and surveillance: Secondary | ICD-10-CM | POA: Diagnosis not present

## 2017-07-15 DIAGNOSIS — E1142 Type 2 diabetes mellitus with diabetic polyneuropathy: Secondary | ICD-10-CM

## 2017-07-15 NOTE — Progress Notes (Signed)
  Medical Nutrition Therapy:  Appt start time: 2620 end time:  3559. Visit # 1  Assessment:  Primary concerns today: blood sugar control, meal planning support.  Ms. Yilmaz lives with her Mother and sister, Her sister is on a bariatric diet, so they all eat that with her for the most part and especailly for dinner. During the day Ms Dusenbury sometimes skips or doesn't eat balanced meals. She very weight conscious and food knowledgeable and reads labels. She is finding that sometimes her sisters meals leave her very hungry afterwards. Overall her blood sugars are excellent.  Preferred Learning Style:  No preference indicated  Learning Readiness: Ready and Change in progress  ANTHROPOMETRICS: weight-157.8#, BMI- 24.71 which is fine for her WEIGHT HISTORY: Highest: -178# in 2015 Lowest- currnet SLEEP:has trouble with getting good and enough sleep  MEDICATIONS: see medication list BLOOD SUGAR: CGM download with average glucose for 7 days 146mg /dl, 79% in target, 21% above target, 0% below target. Her pattern is excellent except two spots that rise between midnight and 3 am and 10am -3 PM. Ms Guereca knew what caused both and made plans to address them DIETARY INTAKE: Generally healthy foods, with excess fruit, skipping of meals and eating carb heavy meals and snacks. Usual eating pattern includes 2-3 meals and 2-3 snacks per day. Everyday foods include  Fruit, water, lowfat burgers, low carb bread, .  Avoided foods include cow's milk.   Food Intolerances: cow's milk, Denies Nausea, Vomiting,Diarrhea, Constipation, hair loss Dining Out (times/week): 1-2 x/week  Usual physical activity: exercises regularly and also gets on treadmill after meal she knows will spike her blood sugar   Progress Towards Goal(s):  In progress.   Nutritional Diagnosis:  NB-1.1 Food and nutrition-related knowledge deficit NB-1.4 Self-monitoring deficit As related to lack fo ability to see spikes after meals as she can with  the CGM.  As evidenced by her verablizing deisre ot change what she is eatig to lower her blood sugar spikes.    Intervention:  Nutrition education about CGM download- assisted Ms Kadar with making pans to eat better balanced meals and snacks to help avoid some of the blood sugar spikes.  Coordination of care: spoke with Dr. Dareen Piano about canceling her appointment as patient did not feel need to see as doctor today Teaching Method Utilized:Visual, Auditory, Hands on Handouts given during visit include:recipes, CGM download Barriers to learning/adherence to lifestyle change: memory problems Demonstrated degree of understanding via:  Teach Back   Monitoring/Evaluation:  Dietary intake, exercise, meter, and body weight in 1 week(s).

## 2017-07-15 NOTE — Telephone Encounter (Signed)
Patient request personal Freestyle libre flash prescriptions be sent to her pharmacy. She plans to pick them up in the next week so she can bring them back next Thursday to be trained on how to use it.

## 2017-07-15 NOTE — Patient Instructions (Addendum)
Good job keeping records and bringing your blood sugar down.  It is great you know what is making it rise, you know how to make it fall.   I will ask for a prescription for the freestyle Elenor Legato personal to be sent to your pharmacy.    Call anytime with questions of concerns  Butch Penny [phone]

## 2017-07-16 MED ORDER — FREESTYLE LIBRE 14 DAY READER DEVI: 1.0000 | Freq: Four times a day (QID) | 1 refills | Status: DC

## 2017-07-16 MED ORDER — FREESTYLE LIBRE 14 DAY SENSOR MISC: 1.0000 | Freq: Four times a day (QID) | 12 refills | Status: DC

## 2017-07-22 ENCOUNTER — Ambulatory Visit: Payer: BLUE CROSS/BLUE SHIELD | Admitting: Dietician

## 2017-07-22 DIAGNOSIS — Z713 Dietary counseling and surveillance: Secondary | ICD-10-CM

## 2017-07-22 DIAGNOSIS — E119 Type 2 diabetes mellitus without complications: Secondary | ICD-10-CM

## 2017-07-22 NOTE — Patient Instructions (Addendum)
Good job lowering your blood sugars!  Please follow up in 3-6 months.   Wand the [place] upon waking and before bedtime, every 4-6 hours during the daytime and at least every 8 hours.

## 2017-07-23 ENCOUNTER — Encounter: Payer: Self-pay | Admitting: Dietician

## 2017-07-23 NOTE — Progress Notes (Signed)
  Medical Nutrition Therapy:  Appt start time: 0093 end time:  1330. Visit # 2  Assessment:  Primary concerns today: blood sugar control, meal planning support.  Laura Gentry presents for her final CGM download and for teaching on her new personal Freestyle Libre CGM. The CGM showed low blood sugar between 4-7 and 10-11 AM that she was not aware of.  She agrees that most of her problem blood sugars come from eating out and skipping meals. She is verbalizing readiness to work on these problems as well as drinking less sweet tea when eating out.  MEDICATIONS: as is listed on her medication list BLOOD SUGAR: CGM download with average glucose for 14 days 149mg /dl, 76% in target, 23% above target, 1% below target. Her pattern is excellent except two spots that rise between midnight and 3 am and 10am -3 PM. Laura Gentry knew what caused both and made plans to address them DIETARY INTAKE: Generally healthy foods, with excess fruit, skipping of meals and eating carb heavy meals and snacks later. Usual eating pattern includes 2-3 meals and 2-3 snacks per day. Everyday foods include  Fruit, vegetables, water, lowfat burgers, low carb bread, .  Avoided foods include cow's milk.    Usual physical activity: exercises regularly and also gets on treadmill after meal she knows will spike her blood sugar   Progress Towards Goal(s):  In progress.   Nutritional Diagnosis:   NB-1.4 Self-monitoring deficit As related to lack fo ability to see spikes after meals as she can with the CGM is improving  As evidenced by her verablizing desire to change what she is eating to lower her blood sugar spikes based on what she sees with the CGM.    Intervention:  Nutrition education about CGM download, assisted with her placing her own personal CGM. She had watched the videos online a nd was well prepared so needed minimal assist.   Coordination of care: none today Teaching Method Utilized:Visual, Auditory, Hands on Handouts given  during visit include:recipes, CGM download Barriers to learning/adherence to lifestyle change: memory problems Demonstrated degree of understanding via:  Teach Back   Monitoring/Evaluation:  Dietary intake, exercise, meter, and body weight in 3 month(s)  Laura Gentry, RD 07/26/2017 4:55 PM. .

## 2017-08-05 ENCOUNTER — Telehealth: Payer: Self-pay | Admitting: Dietician

## 2017-08-05 ENCOUNTER — Encounter: Payer: Self-pay | Admitting: Dietician

## 2017-08-05 NOTE — Telephone Encounter (Signed)
Patient called to let us know that she uploaded her CGM from home. I was able to see, review and print it on our [place].  Laura Gentry wore HER PERSONAL CGM for 14 days. The average reading was 135 14%  Above 180 mg/dL (above 250 mg/dL: 3%) 86%  In Target Range 70-180 mg/dL 0% Below 70 mg/dL  (below 54 mg/dL: 0%) Coefficient of Variation (CV)  31.9% Standard Deviation (SD) mg/dL 43.1  She does not feel any changes needed. Reminded her she is due for a repeat A1C after April 11th. Told her I would share this report with her doctor.

## 2017-08-06 NOTE — Telephone Encounter (Signed)
Thank you for sharing, it looks good.

## 2017-09-09 ENCOUNTER — Other Ambulatory Visit: Payer: Self-pay | Admitting: Internal Medicine

## 2017-09-09 DIAGNOSIS — E1142 Type 2 diabetes mellitus with diabetic polyneuropathy: Secondary | ICD-10-CM

## 2017-09-16 ENCOUNTER — Other Ambulatory Visit: Payer: Self-pay | Admitting: *Deleted

## 2017-09-22 ENCOUNTER — Ambulatory Visit: Payer: BLUE CROSS/BLUE SHIELD

## 2017-10-04 ENCOUNTER — Telehealth: Payer: Self-pay | Admitting: Dietician

## 2017-10-04 ENCOUNTER — Encounter: Payer: Self-pay | Admitting: Dietician

## 2017-10-04 NOTE — Telephone Encounter (Signed)
Called to tell us that she sent CGM download electronically. Also, went to diabetes conference, enjoyed it and plans to donate some of the material.

## 2017-10-05 NOTE — Telephone Encounter (Signed)
Thanks

## 2017-10-05 NOTE — Telephone Encounter (Signed)
Reviewed CGM upload: Laura Gentry wore the CGM for 14 days with 78% time worn. The average reading was 124, % time in target was 82, % time below target was 7, and % time above target was. 11. Coefficient of variation was 35.9% and standard deviation was 44.5mg /dl Low blood sugar risk is between 5-9 PM. Suggest  Decreasing lantus by 4 units to 36 units per day. Report put in Dr. Latina Craver box.

## 2017-10-29 ENCOUNTER — Encounter: Payer: Self-pay | Admitting: Internal Medicine

## 2017-10-29 ENCOUNTER — Ambulatory Visit: Payer: BLUE CROSS/BLUE SHIELD | Admitting: Internal Medicine

## 2017-10-29 ENCOUNTER — Other Ambulatory Visit: Payer: Self-pay

## 2017-10-29 VITALS — BP 133/71 | HR 72 | Temp 98.5°F | Ht 67.0 in | Wt 165.6 lb

## 2017-10-29 DIAGNOSIS — Z8679 Personal history of other diseases of the circulatory system: Secondary | ICD-10-CM | POA: Diagnosis not present

## 2017-10-29 DIAGNOSIS — E1142 Type 2 diabetes mellitus with diabetic polyneuropathy: Secondary | ICD-10-CM

## 2017-10-29 DIAGNOSIS — Z794 Long term (current) use of insulin: Secondary | ICD-10-CM | POA: Diagnosis not present

## 2017-10-29 LAB — POCT GLYCOSYLATED HEMOGLOBIN (HGB A1C): Hemoglobin A1C: 7 % — AB (ref 4.0–5.6)

## 2017-10-29 LAB — GLUCOSE, CAPILLARY: Glucose-Capillary: 125 mg/dL — ABNORMAL HIGH (ref 65–99)

## 2017-10-29 MED ORDER — INSULIN GLARGINE 100 UNIT/ML SOLOSTAR PEN: 32.0000 [IU] | PEN_INJECTOR | Freq: Every day | SUBCUTANEOUS | 11 refills | Status: DC

## 2017-10-29 MED ORDER — INSULIN PEN NEEDLE 32G X 4 MM MISC: 4 refills | Status: DC

## 2017-10-29 NOTE — Patient Instructions (Signed)
Thank you for visiting clinic today. I am glad that you are doing very well with your diabetes and was able to bring your A1c down to 7.  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  Keep up the good work. As we discussed I am decreasing her Lantus to 32 units, if you continue to see low blood sugar you can decrease it to 30 unit. Please follow-up in 38-month.

## 2017-10-29 NOTE — Assessment & Plan Note (Signed)
She has CGM and else download shows 87% within target range, 10% above target of 180 and 3% below 70.  Patient do feel little jittery with hypoglycemia and eating some food help with her symptoms.  A1c today was 7.0, decreased from her previous check in January when it was above 14. Congratulated patient for her big accomplishment. She  thinks that CGM is really helpful. She is watching her diet and taking her Lantus and Invokamet regularly.  -Decrease Lantus from 35 to 32 units at bedtime.  She was advised to decrease it to 30 if she continued to see low blood sugar. -Continue Invokamet xr. -Had her eye exam done in December 2018. -We will check microalbumin urea today. -We will check BMP during next follow-up visit. -Follow-up in 1-month

## 2017-10-29 NOTE — Assessment & Plan Note (Signed)
She follow-up with neurology regularly and compliant with her medications.

## 2017-10-29 NOTE — Progress Notes (Signed)
   CC: Follow-up of her diabetes.  HPI:  [name] is a 62 y.o. with past medical history as listed below came to the clinic to follow-up of her diabetes.  He has no complaints today.  Please see assessment and plan for her chronic conditions.  Past Medical History:  Diagnosis Date  . Brain aneurysm 03/19/13  . Colon polyps   . Diabetes ([facility]) 2013  . High blood pressure   . Kidney stones   . Migraines   . Thyroid disease    Review of Systems: Negative except mentioned in HPI.  Physical Exam:  Vitals:   10/29/17 1459  BP: 133/71  Pulse: 72  Temp: 98.5 F (36.9 C)  TempSrc: Oral  SpO2: 100%  Weight: 165 lb 9.6 oz (75.1 kg)  Height: 5\' 7"  (1.702 m)    General: Vital signs reviewed.  Patient is well-developed and well-nourished, in no acute distress and cooperative with exam.  Head: Normocephalic and atraumatic. Eyes: EOMI, conjunctivae normal, no scleral icterus.  Cardiovascular: RRR, S1 normal, S2 normal, no murmurs, gallops, or rubs. Pulmonary/Chest: Clear to auscultation bilaterally, no wheezes, rales, or rhonchi. Abdominal: Soft, non-tender, non-distended, BS +, no masses, organomegaly, or guarding present.  Extremities: No lower extremity edema bilaterally,  pulses symmetric and intact bilaterally. No cyanosis or clubbing. Neurological: A&O x3, Strength is normal and symmetric bilaterally, cranial nerve II-XII are grossly intact, no focal motor deficit, sensory intact to light touch bilaterally.  Skin: Warm, dry and intact. No rashes or erythema. Psychiatric: Normal mood and affect. speech and behavior is normal. Cognition and memory are normal.  Assessment & Plan:   See Encounters Tab for problem based charting.  Patient discussed with Dr. Evette Doffing.

## 2017-10-30 LAB — MICROALBUMIN / CREATININE URINE RATIO
Creatinine, Urine: 179.1 mg/dL
Microalb/Creat Ratio: 21.4 mg/g creat (ref 0.0–30.0)
Microalbumin, Urine: 38.4 ug/mL

## 2017-11-01 NOTE — Progress Notes (Signed)
Internal Medicine Clinic Attending  Case discussed with Dr. Amin at the time of the visit.  We reviewed the resident's history and exam and pertinent patient test results.  I agree with the assessment, diagnosis, and plan of care documented in the resident's note.    

## 2017-12-03 ENCOUNTER — Encounter (INDEPENDENT_AMBULATORY_CARE_PROVIDER_SITE_OTHER): Payer: Self-pay

## 2017-12-06 ENCOUNTER — Encounter: Payer: Self-pay | Admitting: Internal Medicine

## 2017-12-06 ENCOUNTER — Ambulatory Visit: Payer: BLUE CROSS/BLUE SHIELD | Admitting: Internal Medicine

## 2017-12-06 ENCOUNTER — Other Ambulatory Visit: Payer: Self-pay

## 2017-12-06 VITALS — BP 115/50 | HR 66 | Temp 98.2°F | Ht 67.0 in | Wt 168.7 lb

## 2017-12-06 DIAGNOSIS — R61 Generalized hyperhidrosis: Secondary | ICD-10-CM | POA: Diagnosis not present

## 2017-12-06 DIAGNOSIS — R5383 Other fatigue: Secondary | ICD-10-CM | POA: Diagnosis not present

## 2017-12-06 DIAGNOSIS — R251 Tremor, unspecified: Secondary | ICD-10-CM

## 2017-12-06 DIAGNOSIS — E1142 Type 2 diabetes mellitus with diabetic polyneuropathy: Secondary | ICD-10-CM | POA: Diagnosis not present

## 2017-12-06 DIAGNOSIS — Z79899 Other long term (current) drug therapy: Secondary | ICD-10-CM

## 2017-12-06 LAB — GLUCOSE, CAPILLARY: Glucose-Capillary: 137 mg/dL — ABNORMAL HIGH (ref 70–99)

## 2017-12-06 NOTE — Progress Notes (Addendum)
   CC: Heat intolerance and increased sweating for the past 2 to 62-month.  HPI:  Laura Gentry is a 62 y.o. with past medical history as listed below came to the clinic with complaint of heat intolerance and increased sweating for the past 2 to 40-month.  According to patient she is sweating profusely, cannot stay outdoor, she never sweat that way before, feel little fatigued.  She denies any hot flashes or palpitations.  She is feeling hungry more than usual, having a bowel movement with each meal which is unusual for her and gaining weight.  She denies any urinary urgency, frequency or difficulty with urination.  She denies any chemical exposure. She was also complaining of unable to sleep at night, she feel restless resulted in tossing and turning for long time before falling asleep for the past couple of month. She keeps her air conditioning very high with a fan on at night to prevent profuse sweating.  She is feeling more heat this year as compared to before.  According to patient she was able to work and enjoy outdoor during summer which she cannot do it this year because of this feeling of intense heat and profuse sweating. She denies any incarceration, living in a shelter, working in the prison, or travel to foreign country.  Addendum.  All of her labs which include CBC, CMP, TSH, free T4 and T3 are within normal limit.  Mildly low MCV with no anemia.  Please see assessment and plan for her chronic conditions.  Past Medical History:  Diagnosis Date  . Brain aneurysm 03/19/13  . Colon polyps   . Diabetes ([place]) 2013  . High blood pressure   . Kidney stones   . Migraines   . Thyroid disease    Review of Systems: Negative except mentioned in HPI.  Physical Exam:  Vitals:   12/06/17 1320  BP: (!) 115/50  Pulse: 66  Temp: 98.2 F (36.8 C)  TempSrc: Oral  SpO2: 97%  Weight: 168 lb 11.2 oz (76.5 kg)  Height: 5\' 7"  (1.702 m)   Vitals:   12/06/17 1320  BP: (!) 115/50    Pulse: 66  Temp: 98.2 F (36.8 C)  TempSrc: Oral  SpO2: 97%  Weight: 168 lb 11.2 oz (76.5 kg)  Height: 5\' 7"  (1.702 m)   General: Vital signs reviewed.  Patient is well-developed and well-nourished, in no acute distress and cooperative with exam.  Head: Normocephalic and atraumatic. Eyes: EOMI, conjunctivae normal, no scleral icterus.  Neck: Supple, trachea midline, normal ROM, no JVD, masses, thyromegaly, or carotid bruit present.  Cardiovascular: RRR, S1 normal, S2 normal, no murmurs, gallops, or rubs. Pulmonary/Chest: Clear to auscultation bilaterally, no wheezes, rales, or rhonchi. Abdominal: Soft, non-tender, non-distended, BS +, no masses, organomegaly, or guarding present.  Extremities: No lower extremity edema bilaterally,  pulses symmetric and intact bilaterally. No cyanosis or clubbing. Skin: Moist, warm,and intact. No rashes or erythema. Psychiatric: Normal mood and affect. speech and behavior is normal. Cognition and memory are normal.  Assessment & Plan:   See Encounters Tab for problem based charting.  Patient discussed with Dr. Daryll Drown.

## 2017-12-06 NOTE — Patient Instructions (Signed)
Thank you for visiting clinic today.   As we discussed we will do some lab work, to see if I can find out the cause of your excessive sweating and fatigue.  I will call you with any abnormal results. Please keep a diary of temperature as we discussed to see if you are having any fever. Please keep yourself well-hydrated-use some rehydration supplement as you are losing a lot of salt in your sweat. Please follow-up in 2 to 3 weeks if your symptoms continue to get worse and we do not find any answers through this lab work.

## 2017-12-06 NOTE — Assessment & Plan Note (Signed)
Her symptoms were more consistent with hyperthyroidism, no tachycardia as patient is on propranolol 120 mg daily for her tremors. There is a weight gain instead of weight lost. HIV checked in 2017 was negative. She is not high risk for TB-there are no night sweats, cough or low-grade fever. Patient was also  feeling more fatigued-she has polydipsia secondary to profuse sweating and drink lots of water.  -I asked the patient to keep a temperature diary, check her temperature when she is feeling heart and write it down in a note book, he should bring that notebook during next follow-up visit. -We will check TSH, free T4 and T3. -We will check CBC and CMP for his her complaints of fatigue. -He was advised to put rehydration supplement in her water as she is losing a lot of electrolyte to sweat and drinking plain water can worsen her electrolyte balance.

## 2017-12-06 NOTE — Assessment & Plan Note (Signed)
A1c done last month was 7.0, which was a decreased from 14.  Patient is compliant with her medication and diet.  Continue current management.

## 2017-12-07 LAB — CMP14 + ANION GAP
ALT: 6 IU/L (ref 0–32)
AST: 9 IU/L (ref 0–40)
Albumin/Globulin Ratio: 1.4 (ref 1.2–2.2)
Albumin: 3.9 g/dL (ref 3.6–4.8)
Alkaline Phosphatase: 89 IU/L (ref 39–117)
Anion Gap: 16 mmol/L (ref 10.0–18.0)
BUN/Creatinine Ratio: 14 (ref 12–28)
BUN: 13 mg/dL (ref 8–27)
Bilirubin Total: 0.3 mg/dL (ref 0.0–1.2)
CO2: 21 mmol/L (ref 20–29)
Calcium: 9 mg/dL (ref 8.7–10.3)
Chloride: 105 mmol/L (ref 96–106)
Creatinine, Ser: 0.94 mg/dL (ref 0.57–1.00)
GFR calc Af Amer: 75 mL/min/{1.73_m2} (ref >59)
GFR calc non Af Amer: 65 mL/min/{1.73_m2} (ref >59)
Globulin, Total: 2.8 g/dL (ref 1.5–4.5)
Glucose: 117 mg/dL — ABNORMAL HIGH (ref 65–99)
Potassium: 4.2 mmol/L (ref 3.5–5.2)
Sodium: 142 mmol/L (ref 134–144)
Total Protein: 6.7 g/dL (ref 6.0–8.5)

## 2017-12-07 LAB — T3: T3, Total: 105 ng/dL (ref 71–180)

## 2017-12-07 LAB — CBC
Hematocrit: 38.5 % (ref 34.0–46.6)
Hemoglobin: 12.4 g/dL (ref 11.1–15.9)
MCH: 24.4 pg — ABNORMAL LOW (ref 26.6–33.0)
MCHC: 32.2 g/dL (ref 31.5–35.7)
MCV: 76 fL — ABNORMAL LOW (ref 79–97)
Platelets: 378 10*3/uL (ref 150–450)
RBC: 5.09 x10E6/uL (ref 3.77–5.28)
RDW: 15.9 % — ABNORMAL HIGH (ref 12.3–15.4)
WBC: 9.3 10*3/uL (ref 3.4–10.8)

## 2017-12-07 LAB — T4, FREE: Free T4: 1.07 ng/dL (ref 0.82–1.77)

## 2017-12-07 LAB — TSH: TSH: 1.56 u[IU]/mL (ref 0.450–4.500)

## 2017-12-07 NOTE — Progress Notes (Signed)
Internal Medicine Clinic Attending  Case discussed with Dr. Amin at the time of the visit.  We reviewed the resident's history and exam and pertinent patient test results.  I agree with the assessment, diagnosis, and plan of care documented in the resident's note.    

## 2017-12-14 ENCOUNTER — Encounter: Payer: Self-pay | Admitting: Internal Medicine

## 2017-12-28 ENCOUNTER — Encounter: Payer: Self-pay | Admitting: Dietician

## 2017-12-28 ENCOUNTER — Telehealth: Payer: Self-pay | Admitting: Dietician

## 2017-12-28 NOTE — Telephone Encounter (Signed)
Laura Gentry called to tell us she uploaded her Freestyle Elenor Legato to the [facility] website today. She has not concerns, she is checking in. I told her we'd review it and respond. She verbalized understanding.

## 2017-12-30 NOTE — Telephone Encounter (Signed)
Uploaded report: worn 76% of the time, scanned 5 times a day  Average for last 30 days 114 with 6 hypoglycemic events, compared to  Average for previous 30 days:138 with 3 hypoglycemic events  Average for past 14 days: 113, 89% in target range (70-180mg /dl).                                                     6% above 180mg /dl ( 1% above 250mg /dl)                 5% below 70mg /dl ( 0% below 54mg /dl)   Hypoglycemic events occurred mostly 12AM-6 AM and 2 PM-6 PM  Individual event :    2-3 PM             4-530 PM              8-830 PM       230-7 AM      11PM- 5 AM Lab Results  Component Value Date   HGBA1C 7.0 (A) 10/29/2017   Call to patient: when asked if she had symptoms - she replied "I did", symptoms- shaky, had not eaten. Early dinner and ran out of fuel,got busy and up at night, got shaky.  Sleeps 3AM- 830 AM , does not nap. Takes 32 units Lantus around midnight. Invokanamet in the mornings after breakfast.   Suggest decreasing hypoglycemic diabetes medicine- Lantus by 4 units per standards. The report is in your box. I am happy to call her back to notify her of your changes. She has an appointment with you 01/31/18

## 2017-12-30 NOTE — Telephone Encounter (Signed)
Called Ms. Lovena Le and gave her the order to decrease her Lantus by 4 units to 28 units per Dr. Daryll Drown.  August 1st goes on Medicare, so will not qualify for the [place].

## 2017-12-30 NOTE — Telephone Encounter (Signed)
Dr. Reesa Chew is currently on vacation.  Laura Gentry can you please call the patient back at this time?  Dr. Reesa Chew will review when she returns next week.

## 2018-01-26 ENCOUNTER — Other Ambulatory Visit: Payer: Self-pay | Admitting: Family Medicine

## 2018-01-26 ENCOUNTER — Telehealth: Payer: Self-pay | Admitting: *Deleted

## 2018-01-31 ENCOUNTER — Encounter: Payer: Self-pay | Admitting: Internal Medicine

## 2018-01-31 ENCOUNTER — Ambulatory Visit (INDEPENDENT_AMBULATORY_CARE_PROVIDER_SITE_OTHER): Payer: Medicare Other | Admitting: Internal Medicine

## 2018-01-31 VITALS — BP 140/79 | HR 77 | Temp 99.0°F | Wt 161.6 lb

## 2018-01-31 DIAGNOSIS — R5383 Other fatigue: Secondary | ICD-10-CM | POA: Diagnosis not present

## 2018-01-31 DIAGNOSIS — E1142 Type 2 diabetes mellitus with diabetic polyneuropathy: Secondary | ICD-10-CM

## 2018-01-31 DIAGNOSIS — Z794 Long term (current) use of insulin: Secondary | ICD-10-CM | POA: Diagnosis not present

## 2018-01-31 DIAGNOSIS — Z1231 Encounter for screening mammogram for malignant neoplasm of breast: Secondary | ICD-10-CM | POA: Insufficient documentation

## 2018-01-31 DIAGNOSIS — E785 Hyperlipidemia, unspecified: Secondary | ICD-10-CM

## 2018-01-31 DIAGNOSIS — Z23 Encounter for immunization: Secondary | ICD-10-CM | POA: Diagnosis not present

## 2018-01-31 DIAGNOSIS — R030 Elevated blood-pressure reading, without diagnosis of hypertension: Secondary | ICD-10-CM

## 2018-01-31 DIAGNOSIS — Z79899 Other long term (current) drug therapy: Secondary | ICD-10-CM | POA: Diagnosis not present

## 2018-01-31 DIAGNOSIS — E782 Mixed hyperlipidemia: Secondary | ICD-10-CM

## 2018-01-31 LAB — POCT GLYCOSYLATED HEMOGLOBIN (HGB A1C): Hemoglobin A1C: 6.9 % — AB (ref 4.0–5.6)

## 2018-01-31 LAB — GLUCOSE, CAPILLARY: Glucose-Capillary: 178 mg/dL — ABNORMAL HIGH (ref 70–99)

## 2018-01-31 MED ORDER — ATORVASTATIN CALCIUM 40 MG PO TABS: 40.0000 mg | ORAL_TABLET | Freq: Every day | ORAL | 1 refills | Status: AC

## 2018-01-31 MED ORDER — INSULIN GLARGINE 100 UNIT/ML SOLOSTAR PEN: 25.0000 [IU] | PEN_INJECTOR | Freq: Every day | SUBCUTANEOUS | 11 refills | Status: DC

## 2018-01-31 NOTE — Progress Notes (Signed)
   CC: For follow-up of her diabetes .  HPI:  Laura Gentry is a 62 y.o. with past medical history as listed below came to the clinic for follow-up of her diabetes.  She was little stressed as she is taking care of her 58 year old mother which becoming little overwhelming for her.  He was little upset today as he had a conflict with her mom and was tearful during this clinic visit, stating that her mother is becoming quite agitated and her remarks breaks her heart.  Her mother has advanced cognitive decline.  She has no other complaints today.  See assessment and plan for her chronic conditions.  Past Medical History:  Diagnosis Date  . Brain aneurysm 03/19/13  . Colon polyps   . Diabetes ([place]) 2013  . High blood pressure   . Kidney stones   . Migraines   . Thyroid disease    Review of Systems: Negative except mentioned in HPI  Physical Exam:  Vitals:   01/31/18 1336  BP: (!) 149/79  Pulse: 84  Temp: 99 F (37.2 C)  TempSrc: Oral  SpO2: 100%  Weight: 161 lb 9.6 oz (73.3 kg)   General: Vital signs reviewed.  Patient is well-developed and well-nourished, in no acute distress and cooperative with exam.  Head: Normocephalic and atraumatic. Eyes: EOMI, conjunctivae normal, no scleral icterus.  Cardiovascular: RRR, S1 normal, S2 normal, no murmurs, gallops, or rubs. Pulmonary/Chest: Clear to auscultation bilaterally, no wheezes, rales, or rhonchi. Abdominal: Soft, non-tender, non-distended, BS +, no masses, organomegaly, or guarding present.  Extremities: No lower extremity edema bilaterally,  pulses symmetric and intact bilaterally. No cyanosis or clubbing. Neurological: A&O x3, Strength is normal and symmetric bilaterally, cranial nerve II-XII are grossly intact, no focal motor deficit, sensory intact to light touch bilaterally.  Skin: Warm, dry and intact. No rashes or erythema. Psychiatric: Normal mood and affect. speech and behavior is normal. Cognition and memory  are normal.  Assessment & Plan:   See Encounters Tab for problem based charting.  Patient discussed with Dr. Rebeca Alert.

## 2018-01-31 NOTE — Assessment & Plan Note (Signed)
He was having elevated blood pressure readings during current clinic visit.  Initially it was 149/79 which improved to 140/77 on recheck. According to patient she was very upset after having a conflict with her mom just before coming to her appointment.  I asked the patient to keep a diary of her blood pressure at home. She was provided with DASH diet information.  We will reevaluate during next follow-up visit.

## 2018-01-31 NOTE — Assessment & Plan Note (Signed)
Her symptoms of increased sweating and heat intolerance along with some fatigue is improving.  All her labs were within normal limit.

## 2018-01-31 NOTE — Assessment & Plan Note (Signed)
She is compliant with her medicine. She was wearing CGM up until a week ago, apparently Medicaid stopped paying for her CGM. Her reading shows 85% within target range, overall 3% below average and 12% above average.  1 week ago she, she was having multiple lows in the low 80s and she become symptomatic.  Her A1c today was 6.9.  I will decrease her Lantus to 25 units daily from 28 to help with hypoglycemia. Continue Invokamet. Follow-up in 5-month

## 2018-01-31 NOTE — Assessment & Plan Note (Addendum)
We will recheck lipid profile today.  Refill for Lipitor was provided.  Addendum.  Her lipid profile shows improvement in her triglyceride. Her ASCVD risk is 13.9%-she should be on a moderate to high  intensity statin. Patient already on Lipitor 40 mg daily. Keep monitoring.

## 2018-01-31 NOTE — Assessment & Plan Note (Signed)
Program was ordered today.

## 2018-01-31 NOTE — Patient Instructions (Addendum)
Thank you for visiting clinic today. I am glad that you are doing well, keep up the good work. As we discussed decrease your Lantus to 25 units to eliminate any low blood sugar. Please keep checking your blood pressure at home while resting and write it down, bring that log with you during your next follow-up appointment. Follow-up in 27-month.   DASH Eating Plan DASH stands for "Dietary Approaches to Stop Hypertension." The DASH eating plan is a healthy eating plan that has been shown to reduce high blood pressure (hypertension). It may also reduce your risk for type 2 diabetes, heart disease, and stroke. The DASH eating plan may also help with weight loss. What are tips for following this plan? General guidelines  Avoid eating more than 2,300 mg (milligrams) of salt (sodium) a day. If you have hypertension, you may need to reduce your sodium intake to 1,500 mg a day.  Limit alcohol intake to no more than 1 drink a day for nonpregnant women and 2 drinks a day for men. One drink equals 12 oz of beer, 5 oz of wine, or 1 oz of hard liquor.  Work with your health care provider to maintain a healthy body weight or to lose weight. Ask what an ideal weight is for you.  Get at least 30 minutes of exercise that causes your heart to beat faster (aerobic exercise) most days of the week. Activities may include walking, swimming, or biking.  Work with your health care provider or diet and nutrition specialist (dietitian) to adjust your eating plan to your individual calorie needs. Reading food labels  Check food labels for the amount of sodium per serving. Choose foods with less than 5 percent of the Daily Value of sodium. Generally, foods with less than 300 mg of sodium per serving fit into this eating plan.  To find whole grains, look for the word "whole" as the first word in the ingredient list. Shopping  Buy products labeled as "low-sodium" or "no salt added."  Buy fresh foods. Avoid canned  foods and premade or frozen meals. Cooking  Avoid adding salt when cooking. Use salt-free seasonings or herbs instead of table salt or sea salt. Check with your health care provider or pharmacist before using salt substitutes.  Do not fry foods. Cook foods using healthy methods such as baking, boiling, grilling, and broiling instead.  Cook with heart-healthy oils, such as olive, canola, soybean, or sunflower oil. Meal planning   Eat a balanced diet that includes: ? 5 or more servings of fruits and vegetables each day. At each meal, try to fill half of your plate with fruits and vegetables. ? Up to 6-8 servings of whole grains each day. ? Less than 6 oz of lean meat, poultry, or fish each day. A 3-oz serving of meat is about the same size as a deck of cards. One egg equals 1 oz. ? 2 servings of low-fat dairy each day. ? A serving of nuts, seeds, or beans 5 times each week. ? Heart-healthy fats. Healthy fats called Omega-3 fatty acids are found in foods such as flaxseeds and coldwater fish, like sardines, salmon, and mackerel.  Limit how much you eat of the following: ? Canned or prepackaged foods. ? Food that is high in trans fat, such as fried foods. ? Food that is high in saturated fat, such as fatty meat. ? Sweets, desserts, sugary drinks, and other foods with added sugar. ? Full-fat dairy products.  Do not salt foods before  eating.  Try to eat at least 2 vegetarian meals each week.  Eat more home-cooked food and less restaurant, buffet, and fast food.  When eating at a restaurant, ask that your food be prepared with less salt or no salt, if possible. What foods are recommended? The items listed may not be a complete list. Talk with your dietitian about what dietary choices are best for you. Grains Whole-grain or whole-wheat bread. Whole-grain or whole-wheat pasta. Brown rice. [place]. Bulgur. Whole-grain and low-sodium cereals. Pita bread. Low-fat, low-sodium crackers.  Whole-wheat flour tortillas. Vegetables Fresh or frozen vegetables (raw, steamed, roasted, or grilled). Low-sodium or reduced-sodium tomato and vegetable juice. Low-sodium or reduced-sodium tomato sauce and tomato paste. Low-sodium or reduced-sodium canned vegetables. Fruits All fresh, dried, or frozen fruit. Canned fruit in natural juice (without added sugar). Meat and other protein foods Skinless chicken or [place]. Ground chicken or [place]. Pork with fat trimmed off. Fish and seafood. Egg whites. Dried beans, peas, or lentils. Unsalted nuts, nut butters, and seeds. Unsalted canned beans. Lean cuts of beef with fat trimmed off. Low-sodium, lean deli meat. Dairy Low-fat (1%) or fat-free (skim) milk. Fat-free, low-fat, or reduced-fat cheeses. Nonfat, low-sodium ricotta or cottage cheese. Low-fat or nonfat yogurt. Low-fat, low-sodium cheese. Fats and oils Soft margarine without trans fats. Vegetable oil. Low-fat, reduced-fat, or light mayonnaise and salad dressings (reduced-sodium). Canola, safflower, olive, soybean, and sunflower oils. Avocado. Seasoning and other foods Herbs. Spices. Seasoning mixes without salt. Unsalted popcorn and pretzels. Fat-free sweets. What foods are not recommended? The items listed may not be a complete list. Talk with your dietitian about what dietary choices are best for you. Grains Baked goods made with fat, such as croissants, muffins, or some breads. Dry pasta or rice meal packs. Vegetables Creamed or fried vegetables. Vegetables in a cheese sauce. Regular canned vegetables (not low-sodium or reduced-sodium). Regular canned tomato sauce and paste (not low-sodium or reduced-sodium). Regular tomato and vegetable juice (not low-sodium or reduced-sodium). Angie Fava. Olives. Fruits Canned fruit in a light or heavy syrup. Fried fruit. Fruit in cream or butter sauce. Meat and other protein foods Fatty cuts of meat. Ribs. Fried meat. Berniece Salines. Sausage. Bologna and other  processed lunch meats. Salami. Fatback. Hotdogs. Bratwurst. Salted nuts and seeds. Canned beans with added salt. Canned or smoked fish. Whole eggs or egg yolks. Chicken or [place] with skin. Dairy Whole or 2% milk, cream, and half-and-half. Whole or full-fat cream cheese. Whole-fat or sweetened yogurt. Full-fat cheese. Nondairy creamers. Whipped toppings. Processed cheese and cheese spreads. Fats and oils Butter. Stick margarine. Lard. Shortening. Ghee. Bacon fat. Tropical oils, such as coconut, palm kernel, or palm oil. Seasoning and other foods Salted popcorn and pretzels. Onion salt, garlic salt, seasoned salt, table salt, and sea salt. Worcestershire sauce. Tartar sauce. Barbecue sauce. Teriyaki sauce. Soy sauce, including reduced-sodium. Steak sauce. Canned and packaged gravies. Fish sauce. Oyster sauce. Cocktail sauce. Horseradish that you find on the shelf. Ketchup. Mustard. Meat flavorings and tenderizers. Bouillon cubes. Hot sauce and Tabasco sauce. Premade or packaged marinades. Premade or packaged taco seasonings. Relishes. Regular salad dressings. Where to find more information:  National Heart, Lung, and [place]: https://wilson-eaton.com/  American Heart Association: www.heart.org Summary  The DASH eating plan is a healthy eating plan that has been shown to reduce high blood pressure (hypertension). It may also reduce your risk for type 2 diabetes, heart disease, and stroke.  With the DASH eating plan, you should limit salt (sodium) intake to 2,300 mg a  day. If you have hypertension, you may need to reduce your sodium intake to 1,500 mg a day.  When on the DASH eating plan, aim to eat more fresh fruits and vegetables, whole grains, lean proteins, low-fat dairy, and heart-healthy fats.  Work with your health care provider or diet and nutrition specialist (dietitian) to adjust your eating plan to your individual calorie needs. This information is not intended to replace advice given to  you by your health care provider. Make sure you discuss any questions you have with your health care provider. Document Released: 04/30/2011 Document Revised: 05/04/2016 Document Reviewed: 05/04/2016 Elsevier Interactive Patient Education  [employer].

## 2018-01-31 NOTE — Assessment & Plan Note (Signed)
Flu shot was provided today. 

## 2018-02-01 LAB — LIPID PANEL
Chol/HDL Ratio: 7.4 ratio — ABNORMAL HIGH (ref 0.0–4.4)
Cholesterol, Total: 192 mg/dL (ref 100–199)
HDL: 26 mg/dL — ABNORMAL LOW (ref >39)
LDL Calculated: 134 mg/dL — ABNORMAL HIGH (ref 0–99)
Triglycerides: 161 mg/dL — ABNORMAL HIGH (ref 0–149)
VLDL Cholesterol Cal: 32 mg/dL (ref 5–40)

## 2018-02-03 NOTE — Progress Notes (Signed)
Internal Medicine Clinic Attending  Case discussed with Dr. Amin at the time of the visit.  We reviewed the resident's history and exam and pertinent patient test results.  I agree with the assessment, diagnosis, and plan of care documented in the resident's note.  Alexander Raines, M.D., Ph.D.  

## 2018-03-08 DIAGNOSIS — R296 Repeated falls: Secondary | ICD-10-CM | POA: Diagnosis not present

## 2018-03-08 DIAGNOSIS — R42 Dizziness and giddiness: Secondary | ICD-10-CM | POA: Diagnosis not present

## 2018-03-08 DIAGNOSIS — G3184 Mild cognitive impairment, so stated: Secondary | ICD-10-CM | POA: Diagnosis not present

## 2018-03-08 DIAGNOSIS — R51 Headache: Secondary | ICD-10-CM | POA: Diagnosis not present

## 2018-03-24 ENCOUNTER — Other Ambulatory Visit: Payer: Self-pay | Admitting: Internal Medicine

## 2018-03-24 DIAGNOSIS — G44229 Chronic tension-type headache, not intractable: Secondary | ICD-10-CM | POA: Diagnosis not present

## 2018-03-24 DIAGNOSIS — E1142 Type 2 diabetes mellitus with diabetic polyneuropathy: Secondary | ICD-10-CM

## 2018-03-24 DIAGNOSIS — G4701 Insomnia due to medical condition: Secondary | ICD-10-CM | POA: Diagnosis not present

## 2018-03-24 DIAGNOSIS — R4182 Altered mental status, unspecified: Secondary | ICD-10-CM | POA: Diagnosis not present

## 2018-03-24 DIAGNOSIS — M5412 Radiculopathy, cervical region: Secondary | ICD-10-CM | POA: Diagnosis not present

## 2018-03-24 DIAGNOSIS — M5417 Radiculopathy, lumbosacral region: Secondary | ICD-10-CM | POA: Diagnosis not present

## 2018-04-12 ENCOUNTER — Other Ambulatory Visit: Payer: Self-pay | Admitting: Nurse Practitioner

## 2018-04-12 ENCOUNTER — Ambulatory Visit
Admission: RE | Admit: 2018-04-12 | Discharge: 2018-04-12 | Disposition: A | Discharge status: Home / Self Care | Payer: Medicare HMO | Source: Ambulatory Visit | Attending: Nurse Practitioner | Admitting: Nurse Practitioner

## 2018-04-12 DIAGNOSIS — R0781 Pleurodynia: Secondary | ICD-10-CM

## 2018-04-12 DIAGNOSIS — I10 Essential (primary) hypertension: Secondary | ICD-10-CM | POA: Diagnosis not present

## 2018-04-12 DIAGNOSIS — E782 Mixed hyperlipidemia: Secondary | ICD-10-CM | POA: Diagnosis not present

## 2018-04-12 DIAGNOSIS — Z1231 Encounter for screening mammogram for malignant neoplasm of breast: Secondary | ICD-10-CM | POA: Diagnosis not present

## 2018-04-12 DIAGNOSIS — I671 Cerebral aneurysm, nonruptured: Secondary | ICD-10-CM | POA: Diagnosis not present

## 2018-04-12 DIAGNOSIS — S299XXA Unspecified injury of thorax, initial encounter: Secondary | ICD-10-CM | POA: Diagnosis not present

## 2018-04-12 DIAGNOSIS — Z7689 Persons encountering health services in other specified circumstances: Secondary | ICD-10-CM | POA: Diagnosis not present

## 2018-04-12 DIAGNOSIS — E119 Type 2 diabetes mellitus without complications: Secondary | ICD-10-CM | POA: Diagnosis not present

## 2018-04-19 DIAGNOSIS — R8290 Unspecified abnormal findings in urine: Secondary | ICD-10-CM | POA: Diagnosis not present

## 2018-04-19 DIAGNOSIS — Z23 Encounter for immunization: Secondary | ICD-10-CM | POA: Diagnosis not present

## 2018-04-19 DIAGNOSIS — E119 Type 2 diabetes mellitus without complications: Secondary | ICD-10-CM | POA: Diagnosis not present

## 2018-04-19 DIAGNOSIS — N3000 Acute cystitis without hematuria: Secondary | ICD-10-CM | POA: Diagnosis not present

## 2018-05-09 ENCOUNTER — Encounter: Payer: Medicare HMO | Admitting: Internal Medicine

## 2018-05-12 DIAGNOSIS — M5412 Radiculopathy, cervical region: Secondary | ICD-10-CM | POA: Diagnosis not present

## 2018-05-12 DIAGNOSIS — G603 Idiopathic progressive neuropathy: Secondary | ICD-10-CM | POA: Diagnosis not present

## 2018-05-12 DIAGNOSIS — G5603 Carpal tunnel syndrome, bilateral upper limbs: Secondary | ICD-10-CM | POA: Diagnosis not present

## 2018-05-12 DIAGNOSIS — F331 Major depressive disorder, recurrent, moderate: Secondary | ICD-10-CM | POA: Diagnosis not present

## 2018-05-12 DIAGNOSIS — G44229 Chronic tension-type headache, not intractable: Secondary | ICD-10-CM | POA: Diagnosis not present

## 2018-05-12 DIAGNOSIS — R443 Hallucinations, unspecified: Secondary | ICD-10-CM | POA: Diagnosis not present

## 2018-05-12 DIAGNOSIS — M5417 Radiculopathy, lumbosacral region: Secondary | ICD-10-CM | POA: Diagnosis not present

## 2018-05-12 DIAGNOSIS — R251 Tremor, unspecified: Secondary | ICD-10-CM | POA: Diagnosis not present

## 2018-05-26 ENCOUNTER — Ambulatory Visit
Admission: RE | Admit: 2018-05-26 | Discharge: 2018-05-26 | Disposition: A | Discharge status: Home / Self Care | Payer: Medicare HMO | Source: Ambulatory Visit | Attending: Nurse Practitioner | Admitting: Nurse Practitioner

## 2018-05-26 DIAGNOSIS — Z1231 Encounter for screening mammogram for malignant neoplasm of breast: Secondary | ICD-10-CM | POA: Diagnosis not present

## 2018-06-22 ENCOUNTER — Other Ambulatory Visit: Payer: Self-pay | Admitting: Internal Medicine

## 2018-06-22 DIAGNOSIS — I671 Cerebral aneurysm, nonruptured: Secondary | ICD-10-CM | POA: Diagnosis not present

## 2018-06-22 DIAGNOSIS — I10 Essential (primary) hypertension: Secondary | ICD-10-CM | POA: Diagnosis not present

## 2018-06-22 DIAGNOSIS — E1169 Type 2 diabetes mellitus with other specified complication: Secondary | ICD-10-CM | POA: Diagnosis not present

## 2018-06-22 DIAGNOSIS — Z794 Long term (current) use of insulin: Secondary | ICD-10-CM | POA: Diagnosis not present

## 2018-06-22 DIAGNOSIS — E1142 Type 2 diabetes mellitus with diabetic polyneuropathy: Secondary | ICD-10-CM

## 2018-06-22 DIAGNOSIS — E7800 Pure hypercholesterolemia, unspecified: Secondary | ICD-10-CM | POA: Diagnosis not present

## 2018-09-01 DIAGNOSIS — G44229 Chronic tension-type headache, not intractable: Secondary | ICD-10-CM | POA: Diagnosis not present

## 2018-09-01 DIAGNOSIS — G3184 Mild cognitive impairment, so stated: Secondary | ICD-10-CM | POA: Diagnosis not present

## 2018-09-01 DIAGNOSIS — R251 Tremor, unspecified: Secondary | ICD-10-CM | POA: Diagnosis not present

## 2018-09-01 DIAGNOSIS — G454 Transient global amnesia: Secondary | ICD-10-CM | POA: Diagnosis not present

## 2018-10-20 DIAGNOSIS — E7800 Pure hypercholesterolemia, unspecified: Secondary | ICD-10-CM | POA: Diagnosis not present

## 2018-10-20 DIAGNOSIS — E1169 Type 2 diabetes mellitus with other specified complication: Secondary | ICD-10-CM | POA: Diagnosis not present

## 2018-10-20 DIAGNOSIS — R7989 Other specified abnormal findings of blood chemistry: Secondary | ICD-10-CM | POA: Diagnosis not present

## 2018-10-20 DIAGNOSIS — Z794 Long term (current) use of insulin: Secondary | ICD-10-CM | POA: Diagnosis not present

## 2018-10-20 DIAGNOSIS — I10 Essential (primary) hypertension: Secondary | ICD-10-CM | POA: Diagnosis not present

## 2018-10-20 DIAGNOSIS — I671 Cerebral aneurysm, nonruptured: Secondary | ICD-10-CM | POA: Diagnosis not present

## 2018-10-25 DIAGNOSIS — E1169 Type 2 diabetes mellitus with other specified complication: Secondary | ICD-10-CM | POA: Diagnosis not present

## 2018-10-25 DIAGNOSIS — R7989 Other specified abnormal findings of blood chemistry: Secondary | ICD-10-CM | POA: Diagnosis not present

## 2018-10-25 DIAGNOSIS — E7800 Pure hypercholesterolemia, unspecified: Secondary | ICD-10-CM | POA: Diagnosis not present

## 2018-10-25 DIAGNOSIS — I10 Essential (primary) hypertension: Secondary | ICD-10-CM | POA: Diagnosis not present

## 2019-01-05 DIAGNOSIS — R251 Tremor, unspecified: Secondary | ICD-10-CM | POA: Diagnosis not present

## 2019-01-05 DIAGNOSIS — G3184 Mild cognitive impairment, so stated: Secondary | ICD-10-CM | POA: Diagnosis not present

## 2019-01-05 DIAGNOSIS — G603 Idiopathic progressive neuropathy: Secondary | ICD-10-CM | POA: Diagnosis not present

## 2019-01-05 DIAGNOSIS — G454 Transient global amnesia: Secondary | ICD-10-CM | POA: Diagnosis not present

## 2019-01-05 DIAGNOSIS — M5417 Radiculopathy, lumbosacral region: Secondary | ICD-10-CM | POA: Diagnosis not present

## 2019-01-05 DIAGNOSIS — G5603 Carpal tunnel syndrome, bilateral upper limbs: Secondary | ICD-10-CM | POA: Diagnosis not present

## 2019-01-05 DIAGNOSIS — M5412 Radiculopathy, cervical region: Secondary | ICD-10-CM | POA: Diagnosis not present

## 2019-01-05 DIAGNOSIS — G44229 Chronic tension-type headache, not intractable: Secondary | ICD-10-CM | POA: Diagnosis not present

## 2019-08-22 ENCOUNTER — Other Ambulatory Visit (HOSPITAL_BASED_OUTPATIENT_CLINIC_OR_DEPARTMENT_OTHER): Payer: Self-pay | Admitting: Family Medicine

## 2019-08-22 ENCOUNTER — Other Ambulatory Visit (HOSPITAL_BASED_OUTPATIENT_CLINIC_OR_DEPARTMENT_OTHER): Payer: Self-pay | Admitting: *Deleted

## 2019-08-22 DIAGNOSIS — Z1231 Encounter for screening mammogram for malignant neoplasm of breast: Secondary | ICD-10-CM

## 2019-08-23 ENCOUNTER — Other Ambulatory Visit (HOSPITAL_BASED_OUTPATIENT_CLINIC_OR_DEPARTMENT_OTHER): Payer: Self-pay | Admitting: Internal Medicine

## 2019-08-23 DIAGNOSIS — Z1231 Encounter for screening mammogram for malignant neoplasm of breast: Secondary | ICD-10-CM

## 2019-09-18 ENCOUNTER — Ambulatory Visit (HOSPITAL_BASED_OUTPATIENT_CLINIC_OR_DEPARTMENT_OTHER): Payer: Medicare HMO

## 2019-09-19 ENCOUNTER — Ambulatory Visit (HOSPITAL_BASED_OUTPATIENT_CLINIC_OR_DEPARTMENT_OTHER)
Admission: RE | Admit: 2019-09-19 | Discharge: 2019-09-19 | Disposition: A | Discharge status: Home / Self Care | Payer: Medicare HMO | Source: Ambulatory Visit | Attending: Internal Medicine | Admitting: Internal Medicine

## 2019-09-19 ENCOUNTER — Other Ambulatory Visit: Payer: Self-pay

## 2019-09-19 DIAGNOSIS — Z1231 Encounter for screening mammogram for malignant neoplasm of breast: Secondary | ICD-10-CM | POA: Diagnosis not present

## 2019-09-20 ENCOUNTER — Other Ambulatory Visit: Payer: Self-pay | Admitting: Internal Medicine

## 2019-09-20 DIAGNOSIS — R928 Other abnormal and inconclusive findings on diagnostic imaging of breast: Secondary | ICD-10-CM

## 2019-09-29 ENCOUNTER — Other Ambulatory Visit: Payer: Self-pay | Admitting: Internal Medicine

## 2019-09-29 ENCOUNTER — Ambulatory Visit
Admission: RE | Admit: 2019-09-29 | Discharge: 2019-09-29 | Disposition: A | Discharge status: Home / Self Care | Payer: Medicare HMO | Source: Ambulatory Visit | Attending: Internal Medicine | Admitting: Internal Medicine

## 2019-09-29 ENCOUNTER — Other Ambulatory Visit: Payer: Self-pay

## 2019-09-29 DIAGNOSIS — R928 Other abnormal and inconclusive findings on diagnostic imaging of breast: Secondary | ICD-10-CM

## 2019-09-29 DIAGNOSIS — N6489 Other specified disorders of breast: Secondary | ICD-10-CM | POA: Diagnosis not present

## 2019-09-29 DIAGNOSIS — R922 Inconclusive mammogram: Secondary | ICD-10-CM | POA: Diagnosis not present

## 2019-10-11 ENCOUNTER — Other Ambulatory Visit: Payer: Self-pay

## 2019-10-11 ENCOUNTER — Ambulatory Visit
Admission: RE | Admit: 2019-10-11 | Discharge: 2019-10-11 | Disposition: A | Discharge status: Home / Self Care | Payer: Medicare HMO | Source: Ambulatory Visit | Attending: Internal Medicine | Admitting: Internal Medicine

## 2019-10-11 DIAGNOSIS — N6011 Diffuse cystic mastopathy of right breast: Secondary | ICD-10-CM | POA: Diagnosis not present

## 2019-10-11 DIAGNOSIS — R928 Other abnormal and inconclusive findings on diagnostic imaging of breast: Secondary | ICD-10-CM

## 2019-10-11 DIAGNOSIS — N6341 Unspecified lump in right breast, subareolar: Secondary | ICD-10-CM | POA: Diagnosis not present

## 2019-11-23 DIAGNOSIS — G44229 Chronic tension-type headache, not intractable: Secondary | ICD-10-CM | POA: Diagnosis not present

## 2019-11-23 DIAGNOSIS — E531 Pyridoxine deficiency: Secondary | ICD-10-CM | POA: Diagnosis not present

## 2019-11-23 DIAGNOSIS — G603 Idiopathic progressive neuropathy: Secondary | ICD-10-CM | POA: Diagnosis not present

## 2019-11-23 DIAGNOSIS — M5412 Radiculopathy, cervical region: Secondary | ICD-10-CM | POA: Diagnosis not present

## 2019-11-23 DIAGNOSIS — R270 Ataxia, unspecified: Secondary | ICD-10-CM | POA: Diagnosis not present

## 2019-11-23 DIAGNOSIS — R413 Other amnesia: Secondary | ICD-10-CM | POA: Diagnosis not present

## 2019-11-23 DIAGNOSIS — G5603 Carpal tunnel syndrome, bilateral upper limbs: Secondary | ICD-10-CM | POA: Diagnosis not present

## 2019-11-23 DIAGNOSIS — G4701 Insomnia due to medical condition: Secondary | ICD-10-CM | POA: Diagnosis not present

## 2019-11-23 DIAGNOSIS — R634 Abnormal weight loss: Secondary | ICD-10-CM | POA: Diagnosis not present

## 2019-11-23 DIAGNOSIS — G3184 Mild cognitive impairment, so stated: Secondary | ICD-10-CM | POA: Diagnosis not present

## 2019-11-23 DIAGNOSIS — R202 Paresthesia of skin: Secondary | ICD-10-CM | POA: Diagnosis not present

## 2019-11-23 DIAGNOSIS — M5417 Radiculopathy, lumbosacral region: Secondary | ICD-10-CM | POA: Diagnosis not present

## 2019-11-29 ENCOUNTER — Other Ambulatory Visit: Payer: Self-pay | Admitting: Specialist

## 2019-11-29 DIAGNOSIS — R2681 Unsteadiness on feet: Secondary | ICD-10-CM

## 2019-12-10 ENCOUNTER — Other Ambulatory Visit: Payer: Medicare HMO

## 2019-12-31 ENCOUNTER — Ambulatory Visit
Admission: RE | Admit: 2019-12-31 | Discharge: 2019-12-31 | Disposition: A | Discharge status: Home / Self Care | Payer: Medicare HMO | Source: Ambulatory Visit | Attending: Specialist | Admitting: Specialist

## 2019-12-31 ENCOUNTER — Other Ambulatory Visit: Payer: Self-pay

## 2019-12-31 DIAGNOSIS — I671 Cerebral aneurysm, nonruptured: Secondary | ICD-10-CM | POA: Diagnosis not present

## 2019-12-31 DIAGNOSIS — R2681 Unsteadiness on feet: Secondary | ICD-10-CM | POA: Diagnosis not present

## 2020-01-04 DIAGNOSIS — R519 Headache, unspecified: Secondary | ICD-10-CM | POA: Diagnosis not present

## 2020-01-04 DIAGNOSIS — G4701 Insomnia due to medical condition: Secondary | ICD-10-CM | POA: Diagnosis not present

## 2020-01-04 DIAGNOSIS — R2689 Other abnormalities of gait and mobility: Secondary | ICD-10-CM | POA: Diagnosis not present

## 2020-01-04 DIAGNOSIS — G3184 Mild cognitive impairment, so stated: Secondary | ICD-10-CM | POA: Diagnosis not present

## 2020-01-26 DIAGNOSIS — Z1211 Encounter for screening for malignant neoplasm of colon: Secondary | ICD-10-CM | POA: Diagnosis not present

## 2020-01-26 DIAGNOSIS — I10 Essential (primary) hypertension: Secondary | ICD-10-CM | POA: Diagnosis not present

## 2020-01-26 DIAGNOSIS — E1169 Type 2 diabetes mellitus with other specified complication: Secondary | ICD-10-CM | POA: Diagnosis not present

## 2020-01-26 DIAGNOSIS — E7800 Pure hypercholesterolemia, unspecified: Secondary | ICD-10-CM | POA: Diagnosis not present

## 2020-01-26 DIAGNOSIS — R634 Abnormal weight loss: Secondary | ICD-10-CM | POA: Diagnosis not present

## 2020-01-26 DIAGNOSIS — I671 Cerebral aneurysm, nonruptured: Secondary | ICD-10-CM | POA: Diagnosis not present

## 2020-01-26 DIAGNOSIS — R928 Other abnormal and inconclusive findings on diagnostic imaging of breast: Secondary | ICD-10-CM | POA: Diagnosis not present

## 2020-02-16 ENCOUNTER — Ambulatory Visit
Admission: RE | Admit: 2020-02-16 | Discharge: 2020-02-16 | Disposition: A | Discharge status: Home / Self Care | Payer: Medicare HMO | Source: Ambulatory Visit | Attending: Internal Medicine | Admitting: Internal Medicine

## 2020-02-16 ENCOUNTER — Other Ambulatory Visit: Payer: Self-pay | Admitting: Internal Medicine

## 2020-02-16 DIAGNOSIS — M25512 Pain in left shoulder: Secondary | ICD-10-CM | POA: Diagnosis not present

## 2020-02-16 DIAGNOSIS — M19012 Primary osteoarthritis, left shoulder: Secondary | ICD-10-CM | POA: Diagnosis not present

## 2020-02-20 DIAGNOSIS — M25512 Pain in left shoulder: Secondary | ICD-10-CM | POA: Diagnosis not present

## 2020-02-20 DIAGNOSIS — M542 Cervicalgia: Secondary | ICD-10-CM | POA: Diagnosis not present

## 2020-02-26 DIAGNOSIS — E1169 Type 2 diabetes mellitus with other specified complication: Secondary | ICD-10-CM | POA: Diagnosis not present

## 2020-02-26 DIAGNOSIS — I671 Cerebral aneurysm, nonruptured: Secondary | ICD-10-CM | POA: Diagnosis not present

## 2020-02-27 DIAGNOSIS — M542 Cervicalgia: Secondary | ICD-10-CM | POA: Diagnosis not present

## 2020-02-27 DIAGNOSIS — M25512 Pain in left shoulder: Secondary | ICD-10-CM | POA: Diagnosis not present

## 2020-02-27 DIAGNOSIS — M25612 Stiffness of left shoulder, not elsewhere classified: Secondary | ICD-10-CM | POA: Diagnosis not present

## 2020-02-27 DIAGNOSIS — M6281 Muscle weakness (generalized): Secondary | ICD-10-CM | POA: Diagnosis not present

## 2020-02-29 DIAGNOSIS — M6281 Muscle weakness (generalized): Secondary | ICD-10-CM | POA: Diagnosis not present

## 2020-02-29 DIAGNOSIS — M25512 Pain in left shoulder: Secondary | ICD-10-CM | POA: Diagnosis not present

## 2020-02-29 DIAGNOSIS — M25612 Stiffness of left shoulder, not elsewhere classified: Secondary | ICD-10-CM | POA: Diagnosis not present

## 2020-02-29 DIAGNOSIS — M542 Cervicalgia: Secondary | ICD-10-CM | POA: Diagnosis not present

## 2020-03-01 DIAGNOSIS — E119 Type 2 diabetes mellitus without complications: Secondary | ICD-10-CM | POA: Diagnosis not present

## 2020-03-05 DIAGNOSIS — M6281 Muscle weakness (generalized): Secondary | ICD-10-CM | POA: Diagnosis not present

## 2020-03-05 DIAGNOSIS — M25612 Stiffness of left shoulder, not elsewhere classified: Secondary | ICD-10-CM | POA: Diagnosis not present

## 2020-03-05 DIAGNOSIS — M542 Cervicalgia: Secondary | ICD-10-CM | POA: Diagnosis not present

## 2020-03-05 DIAGNOSIS — M25512 Pain in left shoulder: Secondary | ICD-10-CM | POA: Diagnosis not present

## 2020-03-12 DIAGNOSIS — M25612 Stiffness of left shoulder, not elsewhere classified: Secondary | ICD-10-CM | POA: Diagnosis not present

## 2020-03-12 DIAGNOSIS — M25512 Pain in left shoulder: Secondary | ICD-10-CM | POA: Diagnosis not present

## 2020-03-12 DIAGNOSIS — M542 Cervicalgia: Secondary | ICD-10-CM | POA: Diagnosis not present

## 2020-03-12 DIAGNOSIS — M6281 Muscle weakness (generalized): Secondary | ICD-10-CM | POA: Diagnosis not present

## 2020-03-14 DIAGNOSIS — M25612 Stiffness of left shoulder, not elsewhere classified: Secondary | ICD-10-CM | POA: Diagnosis not present

## 2020-03-14 DIAGNOSIS — M542 Cervicalgia: Secondary | ICD-10-CM | POA: Diagnosis not present

## 2020-03-14 DIAGNOSIS — M6281 Muscle weakness (generalized): Secondary | ICD-10-CM | POA: Diagnosis not present

## 2020-03-14 DIAGNOSIS — M25512 Pain in left shoulder: Secondary | ICD-10-CM | POA: Diagnosis not present

## 2020-03-20 DIAGNOSIS — E119 Type 2 diabetes mellitus without complications: Secondary | ICD-10-CM | POA: Diagnosis not present

## 2020-03-21 DIAGNOSIS — K635 Polyp of colon: Secondary | ICD-10-CM | POA: Diagnosis not present

## 2020-03-21 DIAGNOSIS — R1310 Dysphagia, unspecified: Secondary | ICD-10-CM | POA: Diagnosis not present

## 2020-03-21 DIAGNOSIS — Z800 Family history of malignant neoplasm of digestive organs: Secondary | ICD-10-CM | POA: Diagnosis not present

## 2020-03-21 DIAGNOSIS — R634 Abnormal weight loss: Secondary | ICD-10-CM | POA: Diagnosis not present

## 2020-03-22 DIAGNOSIS — M542 Cervicalgia: Secondary | ICD-10-CM | POA: Diagnosis not present

## 2020-03-22 DIAGNOSIS — M6281 Muscle weakness (generalized): Secondary | ICD-10-CM | POA: Diagnosis not present

## 2020-03-22 DIAGNOSIS — M25612 Stiffness of left shoulder, not elsewhere classified: Secondary | ICD-10-CM | POA: Diagnosis not present

## 2020-03-22 DIAGNOSIS — M25512 Pain in left shoulder: Secondary | ICD-10-CM | POA: Diagnosis not present

## 2020-03-25 DIAGNOSIS — M25612 Stiffness of left shoulder, not elsewhere classified: Secondary | ICD-10-CM | POA: Diagnosis not present

## 2020-03-25 DIAGNOSIS — M6281 Muscle weakness (generalized): Secondary | ICD-10-CM | POA: Diagnosis not present

## 2020-03-25 DIAGNOSIS — M25512 Pain in left shoulder: Secondary | ICD-10-CM | POA: Diagnosis not present

## 2020-03-25 DIAGNOSIS — M542 Cervicalgia: Secondary | ICD-10-CM | POA: Diagnosis not present

## 2020-03-27 DIAGNOSIS — M25612 Stiffness of left shoulder, not elsewhere classified: Secondary | ICD-10-CM | POA: Diagnosis not present

## 2020-03-27 DIAGNOSIS — M6281 Muscle weakness (generalized): Secondary | ICD-10-CM | POA: Diagnosis not present

## 2020-03-27 DIAGNOSIS — M542 Cervicalgia: Secondary | ICD-10-CM | POA: Diagnosis not present

## 2020-03-27 DIAGNOSIS — M25512 Pain in left shoulder: Secondary | ICD-10-CM | POA: Diagnosis not present

## 2020-04-01 ENCOUNTER — Encounter: Payer: Self-pay | Admitting: *Deleted

## 2020-04-02 DIAGNOSIS — M25512 Pain in left shoulder: Secondary | ICD-10-CM | POA: Diagnosis not present

## 2020-04-03 ENCOUNTER — Encounter: Payer: Self-pay | Admitting: Diagnostic Neuroimaging

## 2020-04-03 ENCOUNTER — Ambulatory Visit: Payer: Medicare HMO | Admitting: Diagnostic Neuroimaging

## 2020-04-03 VITALS — BP 106/58 | HR 75 | Ht 67.0 in | Wt 159.4 lb

## 2020-04-03 DIAGNOSIS — I671 Cerebral aneurysm, nonruptured: Secondary | ICD-10-CM | POA: Diagnosis not present

## 2020-04-03 NOTE — Progress Notes (Signed)
[facility]  PATIENT: Laura Gentry DOB: 12/09/1955  REFERRING CLINICIAN: Seward Carol, MD HISTORY FROM: patient  REASON FOR VISIT: new consult    HISTORICAL  CHIEF COMPLAINT:  Chief Complaint  Patient presents with  . Cerebral aneurysm    rm 6 New Pt "evaluate and treat"    HISTORY OF PRESENT ILLNESS:   64 year old female here for transition of care.  History of cerebral aneurysm status post craniotomy and clipping in 2014.  Was managed by neurosurgery and then transition to neurology.  Patient is not sure of details of her history.  Apparently she had the worst headache of her life in 2014 went to the hospital was found to have 1 cm right MCA aneurysm, but apparently unruptured.  CSF analysis showed no subarachnoid hemorrhage or xanthochromia.  This was treated with craniotomy and clipping.  She followed up with neurosurgery and then transition to neurology.  Patient has been seen by Dr. Trula Ore since that time.  Apparently he was prescribing some medications but patient is not sure which medications he was prescribing or for what reason.  She denies seizures.  She does have problems with memory loss and headaches.  Unfortunately no recent outside neurology records available to review today.  Patient is alone for this visit.    REVIEW OF SYSTEMS: Full 14 system review of systems performed and negative with exception of: As per HPI.  ALLERGIES: Allergies  Allergen Reactions  . Amoxicillin Shortness Of Breath  . Bee Venom Shortness Of Breath  . Penicillins Anaphylaxis      . Pneumococcal Vaccines     Per patient due to Brain Aneurysm  . Sulfa Antibiotics Hives, Itching and Swelling  . Bactrim [Sulfamethoxazole-Trimethoprim]     Felt off balance    HOME MEDICATIONS: Outpatient Medications Prior to Visit  Medication Sig Dispense Refill  . atorvastatin (LIPITOR) 40 MG tablet Take 1 tablet (40 mg total) by mouth daily. 60 tablet 1  . Blood  Glucose Monitoring Suppl (ACCU-CHEK AVIVA PLUS) w/Device KIT The patient IS insulin requiring, ICD 10 code E11.9. The patient tests 4 times per day. 1 kit 0  . diclofenac (VOLTAREN) 75 MG EC tablet 75 mg as needed.    . donepezil (ARICEPT) 10 MG tablet Take 2 tablets by mouth daily.    Marland Kitchen gabapentin (NEURONTIN) 300 MG capsule Take 300 mg in AM, 300 mg at noon, 600 mg at bedtime 120 capsule 0  . glucose blood (ACCU-CHEK AVIVA PLUS) test strip Use as instructed. The patient IS insulin requiring, ICD 10 code E11.9. The patient tests 4 times per day. 100 each 12  . Insulin Glargine (LANTUS SOLOSTAR) 100 UNIT/ML Solostar Pen Inject 25 Units into the skin at bedtime. 5 pen 11  . Insulin Pen Needle 32G X 4 MM MISC Use to inject lantus once dialy 100 each 4  . INVOKAMET XR 50-1000 MG TB24 TAKE 2 TABLETS BY MOUTH DAILY WITH BREAKFAST 180 tablet 0  . lamoTRIgine (LAMICTAL) 200 MG tablet Take 200 mg by mouth 2 (two) times daily.    . Lancets (ACCU-CHEK SOFT TOUCH) lancets Use as instructed 100 each 12  . primidone (MYSOLINE) 50 MG tablet Take 1 tablet by mouth daily.  3  . propranolol ER (INDERAL LA) 120 MG 24 hr capsule Take 120 mg by mouth daily.  4  . sertraline (ZOLOFT) 100 MG tablet Take 1 tablet (100 mg total) by mouth daily. 30 tablet 2  . Topiramate ER 100 MG CS24  Take 100 mg by mouth daily. 30 each 0  . Continuous Blood Gluc Receiver (FREESTYLE LIBRE 14 DAY READER) DEVI 1 each by Does not apply route 4 (four) times daily. 1 Device 1  . Continuous Blood Gluc Sensor (FREESTYLE LIBRE 14 DAY SENSOR) MISC 1 each by Does not apply route 4 (four) times daily. 2 each 12   No facility-administered medications prior to visit.    PAST MEDICAL HISTORY: Past Medical History:  Diagnosis Date  . Brain aneurysm 03/19/13  . Cataracts, bilateral   . Cerebral aneurysm   . Colon polyps   . Diabetes ([place]) 2013  . High blood pressure   . High cholesterol   . Kidney stones   . Migraines   . Skin cancer     squamous  . Thyroid disease     PAST SURGICAL HISTORY: Past Surgical History:  Procedure Laterality Date  . APPENDECTOMY    . BRAIN SURGERY  03/19/13   Sugita aneyrsum clip can have MRI up to [place] scanned in   . CHOLECYSTECTOMY  2013  . INNER EAR SURGERY     Lost Hearing  . TONSILLECTOMY AND ADENOIDECTOMY    . VAGINAL HYSTERECTOMY     At age 66 due to "growth"    FAMILY HISTORY: Family History  Problem Relation Age of Onset  . Emphysema Maternal Aunt        never smoked, spouse did  . Breast cancer Maternal Aunt   . Asthma Mother   . Allergies Mother   . Diabetes Mother   . Pancreatic cancer Brother   . Lung cancer Father        smoked  . Throat cancer Father        smoked  . Breast cancer Maternal Aunt   . Breast cancer Paternal Aunt     SOCIAL HISTORY: Social History   Socioeconomic History  . Marital status: Divorced    Spouse name: Not on file  . Number of children: 0  . Years of education: Not on file  . Highest education level: Some college, no degree  Occupational History  . Occupation: Educational psychologist: [employer]    Comment: disabled  Tobacco Use  . Smoking status: Former Smoker    Packs/day: 0.50    Years: 40.00    Pack years: 20.00    Types: Cigarettes    Quit date: 05/26/2011    Years since quitting: 8.8  . Smokeless tobacco: Never Used  Substance and Sexual Activity  . Alcohol use: No    Alcohol/week: 0.0 standard drinks  . Drug use: No  . Sexual activity: Never  Other Topics Concern  . Not on file  Social History Narrative   Caffeine-tea   Social Determinants of Health   Financial Resource Strain:   . Difficulty of Paying Living Expenses: Not on file  Food Insecurity:   . Worried About Charity fundraiser in the Last Year: Not on file  . Ran Out of Food in the Last Year: Not on file  Transportation Needs:   . Lack of Transportation (Medical): Not on file  . Lack of Transportation (Non-Medical): Not on  file  Physical Activity:   . Days of Exercise per Week: Not on file  . Minutes of Exercise per Session: Not on file  Stress:   . Feeling of Stress : Not on file  Social Connections:   . Frequency of Communication with Friends and Family: Not  on file  . Frequency of Social Gatherings with Friends and Family: Not on file  . Attends Religious Services: Not on file  . Active Member of Clubs or Organizations: Not on file  . Attends Archivist Meetings: Not on file  . Marital Status: Not on file  Intimate Partner Violence:   . Fear of Current or Ex-Partner: Not on file  . Emotionally Abused: Not on file  . Physically Abused: Not on file  . Sexually Abused: Not on file     PHYSICAL EXAM  GENERAL EXAM/CONSTITUTIONAL: Vitals:  Vitals:   04/03/20 0845  BP: (!) 106/58  Pulse: 75  Weight: 159 lb 6.4 oz (72.3 kg)  Height: 5' 7" (1.702 m)     Body mass index is 24.97 kg/m. Wt Readings from Last 3 Encounters:  04/03/20 159 lb 6.4 oz (72.3 kg)  01/31/18 161 lb 9.6 oz (73.3 kg)  12/06/17 168 lb 11.2 oz (76.5 kg)     Patient is in no distress; well developed, nourished and groomed; neck is supple  CARDIOVASCULAR:  Examination of carotid arteries is normal; no carotid bruits  Regular rate and rhythm, no murmurs  Examination of peripheral vascular system by observation and palpation is normal  EYES:  Ophthalmoscopic exam of optic discs and posterior segments is normal; no papilledema or hemorrhages  No exam data present  MUSCULOSKELETAL:  Gait, strength, tone, movements noted in Neurologic exam below  NEUROLOGIC: MENTAL STATUS:  No flowsheet data found.  awake, alert, oriented to person, place and time  recent and remote memory intact  normal attention and concentration  language fluent, comprehension intact, naming intact  fund of knowledge appropriate  CRANIAL NERVE:   2nd - no papilledema on fundoscopic exam  2nd, 3rd, 4th, 6th - pupils equal  and reactive to light, visual fields full to confrontation, extraocular muscles intact, no nystagmus  5th - facial sensation symmetric  7th - facial strength symmetric  8th - hearing intact  9th - palate elevates symmetrically, uvula midline  11th - shoulder shrug symmetric  12th - tongue protrusion midline  MOTOR:   normal bulk and tone, full strength in the BUE, BLE  SENSORY:   normal and symmetric to light touch, temperature, vibration  COORDINATION:   finger-nose-finger, fine finger movements normal  REFLEXES:   deep tendon reflexes TRACE and symmetric  GAIT/STATION:   narrow based gait     DIAGNOSTIC DATA (LABS, IMAGING, TESTING) - I reviewed patient records, labs, notes, testing and imaging myself where available.  Lab Results  Component Value Date   WBC 9.3 12/06/2017   HGB 12.4 12/06/2017   HCT 38.5 12/06/2017   MCV 76 (L) 12/06/2017   PLT 378 12/06/2017      Component Value Date/Time   NA 142 12/06/2017 1423   K 4.2 12/06/2017 1423   CL 105 12/06/2017 1423   CO2 21 12/06/2017 1423   GLUCOSE 117 (H) 12/06/2017 1423   GLUCOSE 455 (H) 12/31/2016 1333   BUN 13 12/06/2017 1423   CREATININE 0.94 12/06/2017 1423   CALCIUM 9.0 12/06/2017 1423   PROT 6.7 12/06/2017 1423   ALBUMIN 3.9 12/06/2017 1423   AST 9 12/06/2017 1423   ALT 6 12/06/2017 1423   ALKPHOS 89 12/06/2017 1423   BILITOT 0.3 12/06/2017 1423   GFRNONAA 65 12/06/2017 1423   GFRAA 75 12/06/2017 1423   Lab Results  Component Value Date   CHOL 192 01/31/2018   HDL 26 (L) 01/31/2018   LDLCALC  134 (H) 01/31/2018   LDLDIRECT 120.0 09/26/2015   TRIG 161 (H) 01/31/2018   CHOLHDL 7.4 (H) 01/31/2018   Lab Results  Component Value Date   HGBA1C 6.9 (A) 01/31/2018   No results found for: [id number] Lab Results  Component Value Date   TSH 1.560 12/06/2017       ASSESSMENT AND PLAN  64 y.o. year old female here with:  Dx:  1. Unruptured cerebral aneurysm      PLAN:  HISTORY OF CEREBRAL ANEURYSM (unruptured right MCA; s/p craniotomy and clipping in 2014) - follow up with neurosurgery  MEMORY LOSS - unclear etiology; need prior records and collateral history from family  [place] - previously managed by Dr. Trula Ore since 2015; no records available at this time; patient has poor memory and doesn't have details of medications or history; I encouraged patient to continue follow up with Dr. Trula Ore as he knows her history best. If patient insists on transition of care, then I asked her to discuss with Dr. Trula Ore about this request so he can help facilitate a better transition; she may collect her medical records and bring to [place] for review; also she needs some help from her family or other caregiver since her cognitive status is apparently affected.  Return for return to Dr. Trula Ore.    Penni Bombard, MD 54/27/0623, 7:62 AM Certified in Neurology, Neurophysiology and Neuroimaging  [facility] [address], [place], [place] [address] [phone]

## 2020-04-03 NOTE — Patient Instructions (Signed)
  HISTORY OF CEREBRAL ANEURYSM (unruptured right MCA; s/p craniotomy and clipping in 2014) - follow up with neurosurgery  MEMORY LOSS - unclear etiology  TRANSITION OF CARE REQUEST - previously managed by Dr. Trula Ore since 2015; no records available at this time; patient has poor memory and doesn't have details of medications or history; I encouraged patient to continue follow up with Dr. Trula Ore as he knows her history best. If patient insists on transition of care, then I asked her to discuss with Dr. Trula Ore about this request so he can help facilitate a better transition; she may collect her medical records and bring to [place] for review; also she needs some help from her family or other caregiver since her cognitive status is apparently affected.

## 2020-04-11 DIAGNOSIS — M25512 Pain in left shoulder: Secondary | ICD-10-CM | POA: Diagnosis not present

## 2020-04-16 DIAGNOSIS — M25512 Pain in left shoulder: Secondary | ICD-10-CM | POA: Diagnosis not present

## 2020-04-29 DIAGNOSIS — I10 Essential (primary) hypertension: Secondary | ICD-10-CM | POA: Diagnosis not present

## 2020-04-29 DIAGNOSIS — E7800 Pure hypercholesterolemia, unspecified: Secondary | ICD-10-CM | POA: Diagnosis not present

## 2020-04-29 DIAGNOSIS — E1169 Type 2 diabetes mellitus with other specified complication: Secondary | ICD-10-CM | POA: Diagnosis not present

## 2020-04-29 DIAGNOSIS — Z23 Encounter for immunization: Secondary | ICD-10-CM | POA: Diagnosis not present

## 2020-04-29 DIAGNOSIS — I700 Atherosclerosis of aorta: Secondary | ICD-10-CM | POA: Diagnosis not present

## 2020-04-30 DIAGNOSIS — E119 Type 2 diabetes mellitus without complications: Secondary | ICD-10-CM | POA: Diagnosis not present

## 2020-05-16 DIAGNOSIS — Z1159 Encounter for screening for other viral diseases: Secondary | ICD-10-CM | POA: Diagnosis not present

## 2020-05-21 ENCOUNTER — Encounter (HOSPITAL_BASED_OUTPATIENT_CLINIC_OR_DEPARTMENT_OTHER): Payer: Self-pay | Admitting: Orthopaedic Surgery

## 2020-05-21 ENCOUNTER — Other Ambulatory Visit: Payer: Self-pay

## 2020-05-23 DIAGNOSIS — K635 Polyp of colon: Secondary | ICD-10-CM | POA: Diagnosis not present

## 2020-05-23 DIAGNOSIS — Z86010 Personal history of colonic polyps: Secondary | ICD-10-CM | POA: Diagnosis not present

## 2020-05-23 DIAGNOSIS — K2930 Chronic superficial gastritis without bleeding: Secondary | ICD-10-CM | POA: Diagnosis not present

## 2020-05-23 DIAGNOSIS — D123 Benign neoplasm of transverse colon: Secondary | ICD-10-CM | POA: Diagnosis not present

## 2020-05-23 DIAGNOSIS — K2100 Gastro-esophageal reflux disease with esophagitis, without bleeding: Secondary | ICD-10-CM | POA: Diagnosis not present

## 2020-05-23 DIAGNOSIS — R1310 Dysphagia, unspecified: Secondary | ICD-10-CM | POA: Diagnosis not present

## 2020-05-23 DIAGNOSIS — K219 Gastro-esophageal reflux disease without esophagitis: Secondary | ICD-10-CM | POA: Diagnosis not present

## 2020-05-23 DIAGNOSIS — K3189 Other diseases of stomach and duodenum: Secondary | ICD-10-CM | POA: Diagnosis not present

## 2020-05-27 ENCOUNTER — Other Ambulatory Visit (HOSPITAL_COMMUNITY): Payer: Medicare HMO

## 2020-05-29 ENCOUNTER — Ambulatory Visit (HOSPITAL_BASED_OUTPATIENT_CLINIC_OR_DEPARTMENT_OTHER): Admission: RE | Admit: 2020-05-29 | Payer: Medicare HMO | Source: Home / Self Care | Admitting: Orthopaedic Surgery

## 2020-05-29 DIAGNOSIS — K2930 Chronic superficial gastritis without bleeding: Secondary | ICD-10-CM | POA: Diagnosis not present

## 2020-05-29 DIAGNOSIS — K219 Gastro-esophageal reflux disease without esophagitis: Secondary | ICD-10-CM | POA: Diagnosis not present

## 2020-05-29 DIAGNOSIS — D123 Benign neoplasm of transverse colon: Secondary | ICD-10-CM | POA: Diagnosis not present

## 2020-05-29 HISTORY — DX: Personal history of urinary calculi: Z87.442

## 2020-05-29 SURGERY — ARTHROSCOPY, SHOULDER
Anesthesia: Choice | Site: Shoulder | Laterality: Left

## 2020-06-06 DIAGNOSIS — M25512 Pain in left shoulder: Secondary | ICD-10-CM | POA: Diagnosis not present

## 2020-06-21 DIAGNOSIS — M25512 Pain in left shoulder: Secondary | ICD-10-CM | POA: Diagnosis not present

## 2020-07-02 ENCOUNTER — Other Ambulatory Visit: Payer: Self-pay | Admitting: Internal Medicine

## 2020-07-02 DIAGNOSIS — E2839 Other primary ovarian failure: Secondary | ICD-10-CM | POA: Diagnosis not present

## 2020-07-05 DIAGNOSIS — M25512 Pain in left shoulder: Secondary | ICD-10-CM | POA: Diagnosis not present

## 2020-07-18 DIAGNOSIS — M5412 Radiculopathy, cervical region: Secondary | ICD-10-CM | POA: Diagnosis not present

## 2020-07-18 DIAGNOSIS — M5417 Radiculopathy, lumbosacral region: Secondary | ICD-10-CM | POA: Diagnosis not present

## 2020-07-18 DIAGNOSIS — F331 Major depressive disorder, recurrent, moderate: Secondary | ICD-10-CM | POA: Diagnosis not present

## 2020-07-18 DIAGNOSIS — R519 Headache, unspecified: Secondary | ICD-10-CM | POA: Diagnosis not present

## 2020-09-12 DIAGNOSIS — G603 Idiopathic progressive neuropathy: Secondary | ICD-10-CM | POA: Diagnosis not present

## 2020-09-12 DIAGNOSIS — M5417 Radiculopathy, lumbosacral region: Secondary | ICD-10-CM | POA: Diagnosis not present

## 2020-09-12 DIAGNOSIS — R519 Headache, unspecified: Secondary | ICD-10-CM | POA: Diagnosis not present

## 2020-09-12 DIAGNOSIS — R413 Other amnesia: Secondary | ICD-10-CM | POA: Diagnosis not present

## 2020-09-12 DIAGNOSIS — G4701 Insomnia due to medical condition: Secondary | ICD-10-CM | POA: Diagnosis not present

## 2020-09-12 DIAGNOSIS — M5412 Radiculopathy, cervical region: Secondary | ICD-10-CM | POA: Diagnosis not present

## 2020-09-12 DIAGNOSIS — R251 Tremor, unspecified: Secondary | ICD-10-CM | POA: Diagnosis not present

## 2020-09-12 DIAGNOSIS — G5603 Carpal tunnel syndrome, bilateral upper limbs: Secondary | ICD-10-CM | POA: Diagnosis not present

## 2020-11-06 DIAGNOSIS — M25512 Pain in left shoulder: Secondary | ICD-10-CM | POA: Diagnosis not present

## 2020-11-26 ENCOUNTER — Ambulatory Visit
Admission: RE | Admit: 2020-11-26 | Discharge: 2020-11-26 | Disposition: A | Discharge status: Home / Self Care | Payer: Medicare HMO | Source: Ambulatory Visit | Attending: Internal Medicine | Admitting: Internal Medicine

## 2020-11-26 ENCOUNTER — Other Ambulatory Visit: Payer: Self-pay

## 2020-11-26 DIAGNOSIS — M8588 Other specified disorders of bone density and structure, other site: Secondary | ICD-10-CM | POA: Diagnosis not present

## 2020-11-26 DIAGNOSIS — M810 Age-related osteoporosis without current pathological fracture: Secondary | ICD-10-CM | POA: Diagnosis not present

## 2020-11-26 DIAGNOSIS — Z780 Asymptomatic menopausal state: Secondary | ICD-10-CM | POA: Diagnosis not present

## 2020-11-26 DIAGNOSIS — E2839 Other primary ovarian failure: Secondary | ICD-10-CM

## 2020-11-28 DIAGNOSIS — M25561 Pain in right knee: Secondary | ICD-10-CM | POA: Diagnosis not present

## 2020-11-28 DIAGNOSIS — M25562 Pain in left knee: Secondary | ICD-10-CM | POA: Diagnosis not present

## 2020-12-03 DIAGNOSIS — M25562 Pain in left knee: Secondary | ICD-10-CM | POA: Diagnosis not present

## 2020-12-03 DIAGNOSIS — M25561 Pain in right knee: Secondary | ICD-10-CM | POA: Diagnosis not present

## 2020-12-11 DIAGNOSIS — M25561 Pain in right knee: Secondary | ICD-10-CM | POA: Diagnosis not present

## 2020-12-19 DIAGNOSIS — M5417 Radiculopathy, lumbosacral region: Secondary | ICD-10-CM | POA: Diagnosis not present

## 2020-12-19 DIAGNOSIS — M5412 Radiculopathy, cervical region: Secondary | ICD-10-CM | POA: Diagnosis not present

## 2020-12-19 DIAGNOSIS — M545 Low back pain, unspecified: Secondary | ICD-10-CM | POA: Diagnosis not present

## 2020-12-19 DIAGNOSIS — R519 Headache, unspecified: Secondary | ICD-10-CM | POA: Diagnosis not present

## 2020-12-25 DIAGNOSIS — M25562 Pain in left knee: Secondary | ICD-10-CM | POA: Diagnosis not present

## 2020-12-25 DIAGNOSIS — M2242 Chondromalacia patellae, left knee: Secondary | ICD-10-CM | POA: Diagnosis not present

## 2020-12-25 DIAGNOSIS — M25561 Pain in right knee: Secondary | ICD-10-CM | POA: Diagnosis not present

## 2020-12-25 DIAGNOSIS — M6281 Muscle weakness (generalized): Secondary | ICD-10-CM | POA: Diagnosis not present

## 2021-01-01 DIAGNOSIS — M6281 Muscle weakness (generalized): Secondary | ICD-10-CM | POA: Diagnosis not present

## 2021-01-01 DIAGNOSIS — M25562 Pain in left knee: Secondary | ICD-10-CM | POA: Diagnosis not present

## 2021-01-01 DIAGNOSIS — M2242 Chondromalacia patellae, left knee: Secondary | ICD-10-CM | POA: Diagnosis not present

## 2021-01-01 DIAGNOSIS — M25561 Pain in right knee: Secondary | ICD-10-CM | POA: Diagnosis not present

## 2021-01-08 DIAGNOSIS — M25561 Pain in right knee: Secondary | ICD-10-CM | POA: Diagnosis not present

## 2021-01-08 DIAGNOSIS — M6281 Muscle weakness (generalized): Secondary | ICD-10-CM | POA: Diagnosis not present

## 2021-01-08 DIAGNOSIS — M2242 Chondromalacia patellae, left knee: Secondary | ICD-10-CM | POA: Diagnosis not present

## 2021-01-08 DIAGNOSIS — M25562 Pain in left knee: Secondary | ICD-10-CM | POA: Diagnosis not present

## 2021-01-22 DIAGNOSIS — M25561 Pain in right knee: Secondary | ICD-10-CM | POA: Diagnosis not present

## 2021-01-22 DIAGNOSIS — M6281 Muscle weakness (generalized): Secondary | ICD-10-CM | POA: Diagnosis not present

## 2021-01-22 DIAGNOSIS — M2242 Chondromalacia patellae, left knee: Secondary | ICD-10-CM | POA: Diagnosis not present

## 2021-01-22 DIAGNOSIS — M25562 Pain in left knee: Secondary | ICD-10-CM | POA: Diagnosis not present

## 2021-02-12 DIAGNOSIS — M6281 Muscle weakness (generalized): Secondary | ICD-10-CM | POA: Diagnosis not present

## 2021-02-12 DIAGNOSIS — M25562 Pain in left knee: Secondary | ICD-10-CM | POA: Diagnosis not present

## 2021-02-12 DIAGNOSIS — M2242 Chondromalacia patellae, left knee: Secondary | ICD-10-CM | POA: Diagnosis not present

## 2021-02-12 DIAGNOSIS — M25561 Pain in right knee: Secondary | ICD-10-CM | POA: Diagnosis not present

## 2021-02-27 DIAGNOSIS — G5602 Carpal tunnel syndrome, left upper limb: Secondary | ICD-10-CM | POA: Diagnosis not present

## 2021-03-27 DIAGNOSIS — M5412 Radiculopathy, cervical region: Secondary | ICD-10-CM | POA: Diagnosis not present

## 2021-03-27 DIAGNOSIS — M545 Low back pain, unspecified: Secondary | ICD-10-CM | POA: Diagnosis not present

## 2021-03-27 DIAGNOSIS — R251 Tremor, unspecified: Secondary | ICD-10-CM | POA: Diagnosis not present

## 2021-03-27 DIAGNOSIS — M5417 Radiculopathy, lumbosacral region: Secondary | ICD-10-CM | POA: Diagnosis not present

## 2021-04-14 DIAGNOSIS — H353131 Nonexudative age-related macular degeneration, bilateral, early dry stage: Secondary | ICD-10-CM | POA: Diagnosis not present

## 2021-04-14 DIAGNOSIS — E119 Type 2 diabetes mellitus without complications: Secondary | ICD-10-CM | POA: Diagnosis not present

## 2021-04-14 DIAGNOSIS — H35363 Drusen (degenerative) of macula, bilateral: Secondary | ICD-10-CM | POA: Diagnosis not present

## 2021-04-14 DIAGNOSIS — Z961 Presence of intraocular lens: Secondary | ICD-10-CM | POA: Diagnosis not present

## 2021-05-22 DIAGNOSIS — Z0000 Encounter for general adult medical examination without abnormal findings: Secondary | ICD-10-CM | POA: Diagnosis not present

## 2021-05-22 DIAGNOSIS — Z794 Long term (current) use of insulin: Secondary | ICD-10-CM | POA: Diagnosis not present

## 2021-05-22 DIAGNOSIS — Z8679 Personal history of other diseases of the circulatory system: Secondary | ICD-10-CM | POA: Diagnosis not present

## 2021-05-22 DIAGNOSIS — I700 Atherosclerosis of aorta: Secondary | ICD-10-CM | POA: Diagnosis not present

## 2021-05-22 DIAGNOSIS — E1169 Type 2 diabetes mellitus with other specified complication: Secondary | ICD-10-CM | POA: Diagnosis not present

## 2021-05-22 DIAGNOSIS — M810 Age-related osteoporosis without current pathological fracture: Secondary | ICD-10-CM | POA: Diagnosis not present

## 2021-05-22 DIAGNOSIS — E7800 Pure hypercholesterolemia, unspecified: Secondary | ICD-10-CM | POA: Diagnosis not present

## 2021-05-22 DIAGNOSIS — Z5181 Encounter for therapeutic drug level monitoring: Secondary | ICD-10-CM | POA: Diagnosis not present

## 2021-05-22 DIAGNOSIS — I10 Essential (primary) hypertension: Secondary | ICD-10-CM | POA: Diagnosis not present

## 2021-07-03 DIAGNOSIS — R251 Tremor, unspecified: Secondary | ICD-10-CM | POA: Diagnosis not present

## 2021-07-03 DIAGNOSIS — R269 Unspecified abnormalities of gait and mobility: Secondary | ICD-10-CM | POA: Diagnosis not present

## 2021-07-03 DIAGNOSIS — M545 Low back pain, unspecified: Secondary | ICD-10-CM | POA: Diagnosis not present

## 2021-07-03 DIAGNOSIS — G4701 Insomnia due to medical condition: Secondary | ICD-10-CM | POA: Diagnosis not present

## 2021-07-08 ENCOUNTER — Other Ambulatory Visit: Payer: Self-pay | Admitting: Internal Medicine

## 2021-07-08 DIAGNOSIS — Z1231 Encounter for screening mammogram for malignant neoplasm of breast: Secondary | ICD-10-CM

## 2021-07-10 DIAGNOSIS — H7391 Unspecified disorder of tympanic membrane, right ear: Secondary | ICD-10-CM | POA: Diagnosis not present

## 2021-07-10 DIAGNOSIS — H6690 Otitis media, unspecified, unspecified ear: Secondary | ICD-10-CM | POA: Diagnosis not present

## 2021-07-22 ENCOUNTER — Ambulatory Visit
Admission: RE | Admit: 2021-07-22 | Discharge: 2021-07-22 | Disposition: A | Discharge status: Home / Self Care | Payer: Medicare HMO | Source: Ambulatory Visit

## 2021-07-22 DIAGNOSIS — Z1231 Encounter for screening mammogram for malignant neoplasm of breast: Secondary | ICD-10-CM

## 2021-07-28 DIAGNOSIS — H6690 Otitis media, unspecified, unspecified ear: Secondary | ICD-10-CM | POA: Diagnosis not present

## 2021-09-03 ENCOUNTER — Encounter: Payer: Self-pay | Admitting: *Deleted

## 2021-09-08 ENCOUNTER — Encounter: Payer: Self-pay | Admitting: Diagnostic Neuroimaging

## 2021-09-08 ENCOUNTER — Ambulatory Visit: Payer: Medicare HMO | Admitting: Diagnostic Neuroimaging

## 2021-09-08 VITALS — BP 114/68 | HR 76 | Ht 67.0 in | Wt 153.6 lb

## 2021-09-08 DIAGNOSIS — I671 Cerebral aneurysm, nonruptured: Secondary | ICD-10-CM

## 2021-09-08 DIAGNOSIS — G43109 Migraine with aura, not intractable, without status migrainosus: Secondary | ICD-10-CM

## 2021-09-08 DIAGNOSIS — G250 Essential tremor: Secondary | ICD-10-CM

## 2021-09-08 NOTE — Progress Notes (Signed)
? ?[facility] ? ?PATIENT: Laura Gentry ?DOB: [birth year] ? ?REFERRING CLINICIAN: Seward Carol, MD ?HISTORY FROM: patient  ?REASON FOR VISIT: follow up ? ? ?HISTORICAL ? ?CHIEF COMPLAINT:  ?Chief Complaint  ?Patient presents with  ? Cerebral aneurysm  ?  Rm 7,  est pt last seen 03/2020  ? ? ?HISTORY OF PRESENT ILLNESS:  ? ?UPDATE (09/08/21, VRP): Since last visit, here for transition of care. Reviewed records from Dr. Trula Ore (07/17/21). Still having issues with headaches, tremor, memory loss. Denies any seizures. Has not seen neurosurgery since 2014. Denies history of seizures. Was on preventative levetiracetam for 6 months; but then changed to lamotrigine for some reason (patient doesn't know; not documented in records I reviewed).  ? ? ?PRIOR HPI (04/03/20): 66 year old female here for transition of care. ? ?History of cerebral aneurysm status post craniotomy and clipping in 2014.  Was managed by neurosurgery and then transition to neurology.  Patient is not sure of details of her history.  Apparently she had the worst headache of her life in 2014 went to the hospital was found to have 1 cm right MCA aneurysm, but apparently unruptured.  CSF analysis showed no subarachnoid hemorrhage or xanthochromia.  This was treated with craniotomy and clipping.  She followed up with neurosurgery and then transition to neurology.  Patient has been seen by Dr. Trula Ore since that time.  Apparently he was prescribing some medications but patient is not sure which medications he was prescribing or for what reason.  She denies seizures.  She does have problems with memory loss and headaches.  Unfortunately no recent outside neurology records available to review today.  Patient is alone for this visit. ? ? ? ?REVIEW OF SYSTEMS: Full 14 system review of systems performed and negative with exception of: As per HPI. ? ?ALLERGIES: ?Allergies  ?Allergen Reactions  ? Amoxicillin Shortness Of Breath  ? Bee Venom  Shortness Of Breath  ? Penicillins Anaphylaxis  ?    ? Pneumococcal Vaccines   ?  Per patient due to Brain Aneurysm  ? Sulfa Antibiotics Hives, Itching and Swelling  ? Bactrim [Sulfamethoxazole-Trimethoprim]   ?  Felt off balance  ? ? ?HOME MEDICATIONS: ?Outpatient Medications Prior to Visit  ?Medication Sig Dispense Refill  ? Ascorbic Acid (VITAMIN C PO) Take by mouth.    ? atorvastatin (LIPITOR) 40 MG tablet Take 1 tablet (40 mg total) by mouth daily. 60 tablet 1  ? Blood Glucose Monitoring Suppl (ACCU-CHEK AVIVA PLUS) w/Device KIT The patient IS insulin requiring, ICD 10 code E11.9. The patient tests 4 times per day. 1 kit 0  ? diclofenac (VOLTAREN) 75 MG EC tablet 75 mg as needed.    ? donepezil (ARICEPT) 10 MG tablet Take 2 tablets by mouth daily.    ? gabapentin (NEURONTIN) 300 MG capsule Take 300 mg in AM, 300 mg at noon, 600 mg at bedtime (Patient taking differently: Take 300 mg by mouth 3 (three) times daily.) 120 capsule 0  ? glucose blood (ACCU-CHEK AVIVA PLUS) test strip Use as instructed. The patient IS insulin requiring, ICD 10 code E11.9. The patient tests 4 times per day. 100 each 12  ? Insulin Glargine (LANTUS SOLOSTAR) 100 UNIT/ML Solostar Pen Inject 25 Units into the skin at bedtime. 5 pen 11  ? Insulin Pen Needle 32G X 4 MM MISC Use to inject lantus once dialy 100 each 4  ? INVOKAMET XR 50-1000 MG TB24 TAKE 2 TABLETS BY MOUTH DAILY WITH BREAKFAST 180 tablet  0  ? lamoTRIgine (LAMICTAL) 200 MG tablet Take 100 mg by mouth 2 (two) times daily.    ? Lancets (ACCU-CHEK SOFT TOUCH) lancets Use as instructed 100 each 12  ? propranolol ER (INDERAL LA) 120 MG 24 hr capsule Take 40 mg by mouth in the morning, at noon, and at bedtime.  4  ? sertraline (ZOLOFT) 100 MG tablet Take 1 tablet (100 mg total) by mouth daily. 30 tablet 2  ? primidone (MYSOLINE) 50 MG tablet Take 1 tablet by mouth daily.  3  ? Topiramate ER 100 MG CS24 Take 100 mg by mouth daily. (Patient taking differently: Take 50 mg by mouth 2  (two) times daily.) 30 each 0  ? ?No facility-administered medications prior to visit.  ? ? ?PAST MEDICAL HISTORY: ?Past Medical History:  ?Diagnosis Date  ? Brain aneurysm 03/19/2013  ? Cataracts, bilateral   ? Cerebral aneurysm   ? Colon polyps   ? Diabetes ([place]) 2013  ? Headache   ? High cholesterol   ? History of kidney stones   ? Insomnia   ? Lumbosacral radiculopathy   ? Migraines   ? Osteoporosis   ? Skin cancer   ? squamous  ? Thyroid disease   ? Tremor   ? ? ?PAST SURGICAL HISTORY: ?Past Surgical History:  ?Procedure Laterality Date  ? APPENDECTOMY    ? BRAIN SURGERY  03/19/13  ? Sugita aneyrsum clip can have MRI up to 4T docs scanned in   ? CHOLECYSTECTOMY  2013  ? INNER EAR SURGERY    ? Lost Hearing  ? TONSILLECTOMY AND ADENOIDECTOMY    ? VAGINAL HYSTERECTOMY    ? At age 29 due to "growth"  ? ? ?FAMILY HISTORY: ?Family History  ?Problem Relation Age of Onset  ? Asthma Mother   ? Allergies Mother   ? Diabetes Mother   ? Lung cancer Father   ?     smoked  ? Throat cancer Father   ?     smoked  ? Pancreatic cancer Brother   ? Hypertension Maternal Grandfather   ? Diabetes Maternal Grandfather   ? Emphysema Maternal Aunt   ?     never smoked, spouse did  ? Breast cancer Maternal Aunt   ? Breast cancer Maternal Aunt   ? Breast cancer Paternal Aunt   ? ? ?SOCIAL HISTORY: ?Social History  ? ?Socioeconomic History  ? Marital status: Divorced  ?  Spouse name: Not on file  ? Number of children: 0  ? Years of education: Not on file  ? Highest education level: Some college, no degree  ?Occupational History  ? Occupation: Forensic psychologist   ?  Employer: [employer]  ?  Comment: disabled  ?Tobacco Use  ? Smoking status: Former  ?  Packs/day: 0.50  ?  Years: 40.00  ?  Pack years: 20.00  ?  Types: Cigarettes  ?  Quit date: 05/26/2011  ?  Years since quitting: 10.2  ? Smokeless tobacco: Never  ?Substance and Sexual Activity  ? Alcohol use: No  ?  Alcohol/week: 0.0 standard drinks  ? Drug use: No  ? Sexual  activity: Never  ?Other Topics Concern  ? Not on file  ?Social History Narrative  ? 09/08/21 her mom lives with her  ? Caffeine-tea  ? ?Social Determinants of Health  ? ?Financial Resource Strain: Not on file  ?Food Insecurity: Not on file  ?Transportation Needs: Not on file  ?Physical Activity: Not on file  ?  Stress: Not on file  ?Social Connections: Not on file  ?Intimate Partner Violence: Not on file  ? ? ? ?PHYSICAL EXAM ? ?GENERAL EXAM/CONSTITUTIONAL: ?Vitals:  ?Vitals:  ? 09/08/21 1120  ?BP: 114/68  ?Pulse: 76  ?Weight: 153 lb 9.6 oz (69.7 kg)  ?Height: 5' 7"  (1.702 m)  ? ?Body mass index is 24.06 kg/m?. ?Wt Readings from Last 3 Encounters:  ?09/08/21 153 lb 9.6 oz (69.7 kg)  ?04/03/20 159 lb 6.4 oz (72.3 kg)  ?01/31/18 161 lb 9.6 oz (73.3 kg)  ? ?Patient is in no distress; well developed, nourished and groomed; neck is supple ? ?CARDIOVASCULAR: ?Examination of carotid arteries is normal; no carotid bruits ?Regular rate and rhythm, no murmurs ?Examination of peripheral vascular system by observation and palpation is normal ? ?EYES: ?Ophthalmoscopic exam of optic discs and posterior segments is normal; no papilledema or hemorrhages ?No results found. ? ?MUSCULOSKELETAL: ?Gait, strength, tone, movements noted in Neurologic exam below ? ?NEUROLOGIC: ?MENTAL STATUS:  ?   ? View : No data to display.  ?  ?  ?  ? ?awake, alert, oriented to person, place and time ?recent and remote memory intact ?normal attention and concentration ?language fluent, comprehension intact, naming intact ?fund of knowledge appropriate ? ?CRANIAL NERVE:  ?2nd - no papilledema on fundoscopic exam ?2nd, 3rd, 4th, 6th - pupils equal and reactive to light, visual fields full to confrontation, extraocular muscles intact, no nystagmus ?5th - facial sensation symmetric ?7th - facial strength symmetric ?8th - hearing intact ?9th - palate elevates symmetrically, uvula midline ?11th - shoulder shrug symmetric ?12th - tongue protrusion  midline ? ?MOTOR:  ?normal bulk and tone, full strength in the BUE, BLE ? ?SENSORY:  ?normal and symmetric to light touch, temperature, vibration ? ?COORDINATION:  ?finger-nose-finger, fine finger movements normal ? ?REFLEXES:  ?d

## 2021-09-08 NOTE — Patient Instructions (Addendum)
HISTORY OF CEREBRAL ANEURYSM (unruptured right MCA; s/p craniotomy and clipping in 2014) ?- follow up with Dr. Owens Shark for aneurysm followup and long term monitoring ([facility]) ? ?LAMOTRIGINE USE ?- unclear indication; previously rx'd by Dr. Trula Ore ? ?MEMORY LOSS (could be related to anxiety, insomnia, MCI, polypharmacy) ?- safety / supervision issues reviewed (currently living independently and also caregiver for her 67 year old mother) ?- stop donepezil (no indication for donepezil because patient does not have dementia) ? ?ESSENTIAL TREMOR ?- continue propranolol (no need for primidone, since tremors are well controlled on propranolol) ? ?MIGRAINE HEADACHES + tension headaches ?- stop topiramate (not working; patient not taking) ?- stop sumatriptan (not recommended for h/o of aneurysm) ? ?NEUROPATHY (diabetic neuropathy; a1c 14.5) ?- continue diabetes control ?- continue gabapentin ?

## 2021-09-09 ENCOUNTER — Encounter: Payer: Self-pay | Admitting: Diagnostic Neuroimaging

## 2021-09-09 ENCOUNTER — Telehealth: Payer: Self-pay | Admitting: Diagnostic Neuroimaging

## 2021-09-09 ENCOUNTER — Other Ambulatory Visit: Payer: Self-pay | Admitting: *Deleted

## 2021-09-09 DIAGNOSIS — I671 Cerebral aneurysm, nonruptured: Secondary | ICD-10-CM

## 2021-09-09 NOTE — Telephone Encounter (Signed)
Referral sent to Dr. Alfred Levins @ [facility] [phone]. ?

## 2021-09-15 NOTE — Telephone Encounter (Signed)
Received a fax from [facility] and they stated they need a recent MRI Brain or CT Myleogram within the last past year before scheduling.  ?

## 2021-09-15 NOTE — Telephone Encounter (Signed)
Per Dr Leta Baptist, ordered CTA head (w/wo) for Aneurysm follow up. ?

## 2021-09-15 NOTE — Addendum Note (Signed)
Addended by: Minna Antis on: 09/15/2021 01:43 PM ? ? Modules accepted: Orders ? ?

## 2021-09-16 NOTE — Telephone Encounter (Signed)
Laura Gentry Laura Gentry: [phone] (exp. 09/16/21 to 10/16/21) patient is scheduled at GI for  09/26/21. ?

## 2021-09-26 ENCOUNTER — Other Ambulatory Visit: Payer: Medicare HMO

## 2021-10-02 DIAGNOSIS — H938X1 Other specified disorders of right ear: Secondary | ICD-10-CM | POA: Diagnosis not present

## 2021-10-02 DIAGNOSIS — Z9889 Other specified postprocedural states: Secondary | ICD-10-CM | POA: Diagnosis not present

## 2021-10-02 DIAGNOSIS — H9203 Otalgia, bilateral: Secondary | ICD-10-CM | POA: Diagnosis not present

## 2021-10-02 DIAGNOSIS — Z8669 Personal history of other diseases of the nervous system and sense organs: Secondary | ICD-10-CM | POA: Diagnosis not present

## 2021-10-08 ENCOUNTER — Ambulatory Visit
Admission: RE | Admit: 2021-10-08 | Discharge: 2021-10-08 | Disposition: A | Discharge status: Home / Self Care | Payer: Medicare HMO | Source: Ambulatory Visit | Attending: Diagnostic Neuroimaging | Admitting: Diagnostic Neuroimaging

## 2021-10-08 DIAGNOSIS — I671 Cerebral aneurysm, nonruptured: Secondary | ICD-10-CM

## 2021-10-08 MED ORDER — IOPAMIDOL (ISOVUE-370) INJECTION 76%
75.0000 mL | Freq: Once | INTRAVENOUS | Status: AC | PRN
  Administered 2021-10-08: 75 mL via INTRAVENOUS

## 2021-10-15 NOTE — Telephone Encounter (Signed)
Faxed CT results to [facility]

## 2021-10-28 ENCOUNTER — Emergency Department (HOSPITAL_BASED_OUTPATIENT_CLINIC_OR_DEPARTMENT_OTHER): Payer: Medicare HMO

## 2021-10-28 ENCOUNTER — Inpatient Hospital Stay (HOSPITAL_BASED_OUTPATIENT_CLINIC_OR_DEPARTMENT_OTHER)
Admission: EM | Admit: 2021-10-28 | Discharge: 2021-11-01 | DRG: 853 | Disposition: A | Discharge status: Home / Self Care | Payer: Medicare HMO | Source: Ambulatory Visit | Attending: Internal Medicine | Admitting: Internal Medicine

## 2021-10-28 ENCOUNTER — Encounter (HOSPITAL_BASED_OUTPATIENT_CLINIC_OR_DEPARTMENT_OTHER): Payer: Self-pay | Admitting: Urology

## 2021-10-28 DIAGNOSIS — R6520 Severe sepsis without septic shock: Secondary | ICD-10-CM | POA: Diagnosis present

## 2021-10-28 DIAGNOSIS — N3000 Acute cystitis without hematuria: Secondary | ICD-10-CM | POA: Diagnosis not present

## 2021-10-28 DIAGNOSIS — Z801 Family history of malignant neoplasm of trachea, bronchus and lung: Secondary | ICD-10-CM | POA: Diagnosis not present

## 2021-10-28 DIAGNOSIS — N135 Crossing vessel and stricture of ureter without hydronephrosis: Secondary | ICD-10-CM | POA: Diagnosis not present

## 2021-10-28 DIAGNOSIS — M810 Age-related osteoporosis without current pathological fracture: Secondary | ICD-10-CM | POA: Diagnosis present

## 2021-10-28 DIAGNOSIS — E039 Hypothyroidism, unspecified: Secondary | ICD-10-CM | POA: Diagnosis present

## 2021-10-28 DIAGNOSIS — Z825 Family history of asthma and other chronic lower respiratory diseases: Secondary | ICD-10-CM | POA: Diagnosis not present

## 2021-10-28 DIAGNOSIS — Z8719 Personal history of other diseases of the digestive system: Secondary | ICD-10-CM

## 2021-10-28 DIAGNOSIS — N202 Calculus of kidney with calculus of ureter: Secondary | ICD-10-CM | POA: Diagnosis not present

## 2021-10-28 DIAGNOSIS — I671 Cerebral aneurysm, nonruptured: Secondary | ICD-10-CM | POA: Diagnosis present

## 2021-10-28 DIAGNOSIS — E7800 Pure hypercholesterolemia, unspecified: Secondary | ICD-10-CM | POA: Diagnosis present

## 2021-10-28 DIAGNOSIS — I10 Essential (primary) hypertension: Secondary | ICD-10-CM | POA: Diagnosis present

## 2021-10-28 DIAGNOSIS — A419 Sepsis, unspecified organism: Secondary | ICD-10-CM | POA: Diagnosis present

## 2021-10-28 DIAGNOSIS — E1010 Type 1 diabetes mellitus with ketoacidosis without coma: Secondary | ICD-10-CM | POA: Diagnosis not present

## 2021-10-28 DIAGNOSIS — R197 Diarrhea, unspecified: Secondary | ICD-10-CM | POA: Diagnosis present

## 2021-10-28 DIAGNOSIS — Z833 Family history of diabetes mellitus: Secondary | ICD-10-CM | POA: Diagnosis not present

## 2021-10-28 DIAGNOSIS — N132 Hydronephrosis with renal and ureteral calculous obstruction: Secondary | ICD-10-CM | POA: Diagnosis not present

## 2021-10-28 DIAGNOSIS — Z87442 Personal history of urinary calculi: Secondary | ICD-10-CM | POA: Diagnosis not present

## 2021-10-28 DIAGNOSIS — Z85828 Personal history of other malignant neoplasm of skin: Secondary | ICD-10-CM

## 2021-10-28 DIAGNOSIS — N281 Cyst of kidney, acquired: Secondary | ICD-10-CM | POA: Diagnosis not present

## 2021-10-28 DIAGNOSIS — R1110 Vomiting, unspecified: Secondary | ICD-10-CM | POA: Diagnosis not present

## 2021-10-28 DIAGNOSIS — N390 Urinary tract infection, site not specified: Secondary | ICD-10-CM | POA: Diagnosis not present

## 2021-10-28 DIAGNOSIS — N179 Acute kidney failure, unspecified: Secondary | ICD-10-CM | POA: Diagnosis present

## 2021-10-28 DIAGNOSIS — E860 Dehydration: Secondary | ICD-10-CM | POA: Diagnosis present

## 2021-10-28 DIAGNOSIS — N201 Calculus of ureter: Secondary | ICD-10-CM | POA: Diagnosis present

## 2021-10-28 DIAGNOSIS — Z87891 Personal history of nicotine dependence: Secondary | ICD-10-CM | POA: Diagnosis not present

## 2021-10-28 DIAGNOSIS — Z90710 Acquired absence of both cervix and uterus: Secondary | ICD-10-CM

## 2021-10-28 DIAGNOSIS — E876 Hypokalemia: Secondary | ICD-10-CM | POA: Diagnosis not present

## 2021-10-28 DIAGNOSIS — N1330 Unspecified hydronephrosis: Secondary | ICD-10-CM | POA: Diagnosis not present

## 2021-10-28 DIAGNOSIS — R000 Tachycardia, unspecified: Secondary | ICD-10-CM | POA: Diagnosis not present

## 2021-10-28 DIAGNOSIS — Z1152 Encounter for screening for COVID-19: Secondary | ICD-10-CM | POA: Diagnosis not present

## 2021-10-28 DIAGNOSIS — Z79899 Other long term (current) drug therapy: Secondary | ICD-10-CM

## 2021-10-28 DIAGNOSIS — Z808 Family history of malignant neoplasm of other organs or systems: Secondary | ICD-10-CM

## 2021-10-28 DIAGNOSIS — Z803 Family history of malignant neoplasm of breast: Secondary | ICD-10-CM | POA: Diagnosis not present

## 2021-10-28 DIAGNOSIS — N10 Acute tubulo-interstitial nephritis: Secondary | ICD-10-CM | POA: Diagnosis not present

## 2021-10-28 DIAGNOSIS — R0602 Shortness of breath: Secondary | ICD-10-CM | POA: Diagnosis not present

## 2021-10-28 DIAGNOSIS — N136 Pyonephrosis: Secondary | ICD-10-CM | POA: Diagnosis present

## 2021-10-28 DIAGNOSIS — Z794 Long term (current) use of insulin: Secondary | ICD-10-CM | POA: Diagnosis not present

## 2021-10-28 DIAGNOSIS — Z800 Family history of malignant neoplasm of digestive organs: Secondary | ICD-10-CM

## 2021-10-28 DIAGNOSIS — E1110 Type 2 diabetes mellitus with ketoacidosis without coma: Secondary | ICD-10-CM | POA: Diagnosis present

## 2021-10-28 DIAGNOSIS — I3139 Other pericardial effusion (noninflammatory): Secondary | ICD-10-CM | POA: Diagnosis not present

## 2021-10-28 DIAGNOSIS — E0810 Diabetes mellitus due to underlying condition with ketoacidosis without coma: Secondary | ICD-10-CM | POA: Diagnosis not present

## 2021-10-28 DIAGNOSIS — I700 Atherosclerosis of aorta: Secondary | ICD-10-CM | POA: Diagnosis not present

## 2021-10-28 DIAGNOSIS — E119 Type 2 diabetes mellitus without complications: Secondary | ICD-10-CM | POA: Diagnosis not present

## 2021-10-28 DIAGNOSIS — Z8249 Family history of ischemic heart disease and other diseases of the circulatory system: Secondary | ICD-10-CM

## 2021-10-28 LAB — CBC WITH DIFFERENTIAL/PLATELET
Abs Immature Granulocytes: 0.05 10*3/uL (ref 0.00–0.07)
Basophils Absolute: 0.1 10*3/uL (ref 0.0–0.1)
Basophils Relative: 1 %
Eosinophils Absolute: 0 10*3/uL (ref 0.0–0.5)
Eosinophils Relative: 0 %
HCT: 45 % (ref 36.0–46.0)
Hemoglobin: 14.8 g/dL (ref 12.0–15.0)
Immature Granulocytes: 0 %
Lymphocytes Relative: 22 %
Lymphs Abs: 3.3 10*3/uL (ref 0.7–4.0)
MCH: 24.7 pg — ABNORMAL LOW (ref 26.0–34.0)
MCHC: 32.9 g/dL (ref 30.0–36.0)
MCV: 75.3 fL — ABNORMAL LOW (ref 80.0–100.0)
Monocytes Absolute: 0.8 10*3/uL (ref 0.1–1.0)
Monocytes Relative: 6 %
Neutro Abs: 10.5 10*3/uL — ABNORMAL HIGH (ref 1.7–7.7)
Neutrophils Relative %: 71 %
Platelets: 414 10*3/uL — ABNORMAL HIGH (ref 150–400)
RBC: 5.98 MIL/uL — ABNORMAL HIGH (ref 3.87–5.11)
RDW: 14.7 % (ref 11.5–15.5)
WBC: 14.8 10*3/uL — ABNORMAL HIGH (ref 4.0–10.5)
nRBC: 0 % (ref 0.0–0.2)

## 2021-10-28 LAB — COMPREHENSIVE METABOLIC PANEL
ALT: 15 U/L (ref 0–44)
AST: 24 U/L (ref 15–41)
Albumin: 4 g/dL (ref 3.5–5.0)
Alkaline Phosphatase: 102 U/L (ref 38–126)
Anion gap: 19 — ABNORMAL HIGH (ref 5–15)
BUN: 26 mg/dL — ABNORMAL HIGH (ref 8–23)
CO2: 19 mmol/L — ABNORMAL LOW (ref 22–32)
Calcium: 9.4 mg/dL (ref 8.9–10.3)
Chloride: 95 mmol/L — ABNORMAL LOW (ref 98–111)
Creatinine, Ser: 1.38 mg/dL — ABNORMAL HIGH (ref 0.44–1.00)
GFR, Estimated: 42 mL/min — ABNORMAL LOW (ref >60)
Glucose, Bld: 298 mg/dL — ABNORMAL HIGH (ref 70–99)
Potassium: 3.6 mmol/L (ref 3.5–5.1)
Sodium: 133 mmol/L — ABNORMAL LOW (ref 135–145)
Total Bilirubin: 2 mg/dL — ABNORMAL HIGH (ref 0.3–1.2)
Total Protein: 8.6 g/dL — ABNORMAL HIGH (ref 6.5–8.1)

## 2021-10-28 LAB — BASIC METABOLIC PANEL
Anion gap: 11 (ref 5–15)
BUN: 25 mg/dL — ABNORMAL HIGH (ref 8–23)
CO2: 21 mmol/L — ABNORMAL LOW (ref 22–32)
Calcium: 8.2 mg/dL — ABNORMAL LOW (ref 8.9–10.3)
Chloride: 103 mmol/L (ref 98–111)
Creatinine, Ser: 1.3 mg/dL — ABNORMAL HIGH (ref 0.44–1.00)
GFR, Estimated: 45 mL/min — ABNORMAL LOW (ref >60)
Glucose, Bld: 235 mg/dL — ABNORMAL HIGH (ref 70–99)
Potassium: 3.4 mmol/L — ABNORMAL LOW (ref 3.5–5.1)
Sodium: 135 mmol/L (ref 135–145)

## 2021-10-28 LAB — CBG MONITORING, ED
Glucose-Capillary: 179 mg/dL — ABNORMAL HIGH (ref 70–99)
Glucose-Capillary: 190 mg/dL — ABNORMAL HIGH (ref 70–99)
Glucose-Capillary: 193 mg/dL — ABNORMAL HIGH (ref 70–99)
Glucose-Capillary: 227 mg/dL — ABNORMAL HIGH (ref 70–99)
Glucose-Capillary: 266 mg/dL — ABNORMAL HIGH (ref 70–99)

## 2021-10-28 LAB — SARS CORONAVIRUS 2 BY RT PCR: SARS Coronavirus 2 by RT PCR: NEGATIVE

## 2021-10-28 LAB — URINALYSIS, ROUTINE W REFLEX MICROSCOPIC
Glucose, UA: 100 mg/dL — AB
Ketones, ur: >=80 mg/dL — AB
Nitrite: NEGATIVE
Protein, ur: 100 mg/dL — AB
Specific Gravity, Urine: >=1.03 (ref 1.005–1.030)
pH: 5 (ref 5.0–8.0)

## 2021-10-28 LAB — URINALYSIS, MICROSCOPIC (REFLEX): RBC / HPF: >50 RBC/hpf (ref 0–5)

## 2021-10-28 LAB — LACTIC ACID, PLASMA
Lactic Acid, Venous: 2.1 mmol/L (ref 0.5–1.9)
Lactic Acid, Venous: 1.3 mmol/L (ref 0.5–1.9)

## 2021-10-28 LAB — LIPASE, BLOOD: Lipase: 24 U/L (ref 11–51)

## 2021-10-28 MED ORDER — POTASSIUM CHLORIDE 10 MEQ/100ML IV SOLN
10.0000 meq | INTRAVENOUS | Status: AC
  Administered 2021-10-28 (×2): 10 meq via INTRAVENOUS
  Filled 2021-10-28 (×2): qty 100

## 2021-10-28 MED ORDER — INSULIN REGULAR(HUMAN) IN NACL 100-0.9 UT/100ML-% IV SOLN
INTRAVENOUS | Status: DC
  Administered 2021-10-28: 5.5 [IU]/h via INTRAVENOUS
  Filled 2021-10-28: qty 100

## 2021-10-28 MED ORDER — SODIUM CHLORIDE 0.9 % IV BOLUS
1000.0000 mL | Freq: Once | INTRAVENOUS | Status: AC
  Administered 2021-10-28: 1000 mL via INTRAVENOUS

## 2021-10-28 MED ORDER — SODIUM CHLORIDE 0.9 % IV SOLN: 1.0000 g | Freq: Once | INTRAVENOUS | Status: DC

## 2021-10-28 MED ORDER — DEXTROSE IN LACTATED RINGERS 5 % IV SOLN: INTRAVENOUS | Status: DC

## 2021-10-28 MED ORDER — SODIUM CHLORIDE 0.9 % IV SOLN
1.0000 g | Freq: Two times a day (BID) | INTRAVENOUS | Status: DC
  Administered 2021-10-28 – 2021-10-29 (×2): 1 g via INTRAVENOUS
  Filled 2021-10-28 (×2): qty 20

## 2021-10-28 MED ORDER — LACTATED RINGERS IV SOLN: INTRAVENOUS | Status: DC

## 2021-10-28 MED ORDER — LACTATED RINGERS IV SOLN
INTRAVENOUS | Status: DC
Start: 2021-10-28 — End: 2021-10-29

## 2021-10-28 MED ORDER — DEXTROSE 50 % IV SOLN: 0.0000 mL | INTRAVENOUS | Status: DC | PRN

## 2021-10-28 NOTE — ED Notes (Signed)
Blood Cultures x 1 obtained from rt hand

## 2021-10-28 NOTE — ED Notes (Signed)
MD Long made aware of BMP resulting and UA and UC being obtained.

## 2021-10-28 NOTE — ED Triage Notes (Signed)
Pt hyperventilating at triage Reports "cant breath"  Grabbing at chest but denies pain  States been sick for last 3-4 days with nausea and vomiting  Reports fever at home of 89 States hematuria that started 4-5 days ago but denies dysuria, reports dx of kidney stone in right kidney

## 2021-10-28 NOTE — ED Notes (Addendum)
Called to registration for SHOB, HR 155, SPO2 100, hyperventilating encouraged to slow breathing.  BBS decreased but clear. To triage for EKG.

## 2021-10-28 NOTE — ED Notes (Signed)
Patient to CT.

## 2021-10-28 NOTE — ED Notes (Signed)
Insulin gtt

## 2021-10-28 NOTE — ED Provider Notes (Signed)
Emergency Department Provider Note   I have reviewed the triage vital signs and the nursing notes.   HISTORY  Chief Complaint Shortness of Breath   HPI Laura Gentry is a 66 y.o. female with past medical history reviewed below including brain aneurysm status post clip, insulin-dependent diabetes, and chronic HA presents to the ED with severe generalized weakness, fever, abdominal discomfort.  Patient reports vomiting and diarrhea.  Abdominal discomfort is more diffuse.  Denies any pain or pressure in the chest but does feel short of breath especially with even minimal exertion. No COPD or emphysema history.  She states that over the past 3 to 4 days she is felt so bad she has been unable to take her insulin or any other home medications.  She has a persistent diffuse headache which she states is not unusual for her but she tries to take medication for the headache she vomits and so those symptoms are persistent.    Past Medical History:  Diagnosis Date   Brain aneurysm 03/19/2013   Cataracts, bilateral    Cerebral aneurysm    Colon polyps    Diabetes ([place]) 2013   Headache    High cholesterol    History of kidney stones    Insomnia    Lumbosacral radiculopathy    Migraines    Osteoporosis    Skin cancer    squamous   Thyroid disease    Tremor     Review of Systems  Constitutional: Positive fever/chills Eyes: No visual changes. Cardiovascular: Denies chest pain. Respiratory: Positive shortness of breath. Gastrointestinal: Positive abdominal pain. Positive nausea, vomiting, and diarrhea.  No constipation. Musculoskeletal: Negative for back pain. Skin: Negative for rash. Neurological: Negative for focal weakness or numbness. Positive HA.    ____________________________________________   PHYSICAL EXAM:  VITAL SIGNS: ED Triage Vitals  Enc Vitals Group     BP 10/28/21 1647 100/81     Pulse Rate 10/28/21 1647 (!) 144     Resp 10/28/21 1647 (!) 22     Temp  10/28/21 1647 97.9 F (36.6 C)     Temp Source 10/28/21 1647 Oral     SpO2 10/28/21 1647 100 %     Weight 10/28/21 1645 153 lb 10.6 oz (69.7 kg)     Height 10/28/21 1645 '5\' 7"'$  (1.702 m)   Constitutional: Alert and oriented. Patient appears unwell.  Eyes: Conjunctivae are normal.  Head: Atraumatic. Nose: No congestion/rhinnorhea. Mouth/Throat: Mucous membranes are very dry.  Neck: No stridor.  Cardiovascular: Tachycardia. Good peripheral circulation. Grossly normal heart sounds.   Respiratory: Increased respiratory effort.  No retractions. Lungs CTAB. Gastrointestinal: Soft and nontender. No distention.  Musculoskeletal: No lower extremity tenderness nor edema. No gross deformities of extremities. Neurologic:  Normal speech and language.  Skin:  Skin is warm, dry and intact. No rash noted.  ____________________________________________   LABS (all labs ordered are listed, but only abnormal results are displayed)  Labs Reviewed  CBC WITH DIFFERENTIAL/PLATELET - Abnormal; Notable for the following components:      Result Value   WBC 14.8 (*)    RBC 5.98 (*)    MCV 75.3 (*)    MCH 24.7 (*)    Platelets 414 (*)    Neutro Abs 10.5 (*)    All other components within normal limits  COMPREHENSIVE METABOLIC PANEL - Abnormal; Notable for the following components:   Sodium 133 (*)    Chloride 95 (*)    CO2 19 (*)  Glucose, Bld 298 (*)    BUN 26 (*)    Creatinine, Ser 1.38 (*)    Total Protein 8.6 (*)    Total Bilirubin 2.0 (*)    GFR, Estimated 42 (*)    Anion gap 19 (*)    All other components within normal limits  LACTIC ACID, PLASMA - Abnormal; Notable for the following components:   Lactic Acid, Venous 2.1 (*)    All other components within normal limits  URINALYSIS, ROUTINE W REFLEX MICROSCOPIC - Abnormal; Notable for the following components:   APPearance CLOUDY (*)    Glucose, UA 100 (*)    Hgb urine dipstick LARGE (*)    Bilirubin Urine MODERATE (*)    Ketones, ur  >=80 (*)    Protein, ur 100 (*)    Leukocytes,Ua SMALL (*)    All other components within normal limits  BASIC METABOLIC PANEL - Abnormal; Notable for the following components:   Potassium 3.4 (*)    CO2 21 (*)    Glucose, Bld 235 (*)    BUN 25 (*)    Creatinine, Ser 1.30 (*)    Calcium 8.2 (*)    GFR, Estimated 45 (*)    All other components within normal limits  URINALYSIS, MICROSCOPIC (REFLEX) - Abnormal; Notable for the following components:   Bacteria, UA FEW (*)    All other components within normal limits  CBG MONITORING, ED - Abnormal; Notable for the following components:   Glucose-Capillary 266 (*)    All other components within normal limits  CBG MONITORING, ED - Abnormal; Notable for the following components:   Glucose-Capillary 227 (*)    All other components within normal limits  CBG MONITORING, ED - Abnormal; Notable for the following components:   Glucose-Capillary 190 (*)    All other components within normal limits  CBG MONITORING, ED - Abnormal; Notable for the following components:   Glucose-Capillary 193 (*)    All other components within normal limits  SARS CORONAVIRUS 2 BY RT PCR  URINE CULTURE  LIPASE, BLOOD  LACTIC ACID, PLASMA  BETA-HYDROXYBUTYRIC ACID   ____________________________________________  EKG   EKG Interpretation  Date/Time:  Tuesday October 28 2021 16:46:28 EDT Ventricular Rate:  147 PR Interval:  122 QRS Duration: 72 QT Interval:  290 QTC Calculation: 453 R Axis:   84 Text Interpretation: Sinus tachycardia Nonspecific ST abnormality Abnormal ECG When compared with ECG of 07-Mar-2015 18:53, PREVIOUS ECG IS PRESENT Confirmed by Nanda Quinton [id number]) on 10/28/2021 4:52:52 PM        ____________________________________________  RADIOLOGY  DG Chest 2 View  Result Date: 10/28/2021 CLINICAL DATA:  Shortness of breath.  Tightness in chest. EXAM: CHEST - 2 VIEW COMPARISON:  PA chest and right rib radiographs 04/12/2018; chest two views  03/07/2015 FINDINGS: Cardiac silhouette and mediastinal contours are within normal limits with calcification again seen within the aortic arch. There is flattening of the diaphragms and moderate hyperinflation. Increased lucencies within the upper lungs are again seen suggesting chronic cystic emphysematous changes. No focal airspace opacity to indicate pneumonia. No pleural effusion or pneumothorax. Mild-to-moderate multilevel degenerative disc changes of the thoracic spine. Cholecystectomy clips. IMPRESSION: 1. Chronic hyperinflation and findings suggesting emphysematous change. 2. No acute lung process. Electronically Signed   By: Yvonne Kendall M.D.   On: 10/28/2021 17:11   CT Head Wo Contrast  Result Date: 10/28/2021 CLINICAL DATA:  Shortness of breath with vomiting and diarrhea. EXAM: CT HEAD WITHOUT CONTRAST TECHNIQUE: Contiguous axial images  were obtained from the base of the skull through the vertex without intravenous contrast. RADIATION DOSE REDUCTION: This exam was performed according to the departmental dose-optimization program which includes automated exposure control, adjustment of the mA and/or kV according to patient size and/or use of iterative reconstruction technique. COMPARISON:  Oct 08, 2021 FINDINGS: Brain: No evidence of acute infarction, hemorrhage, hydrocephalus, extra-axial collection or mass lesion/mass effect. Vascular: A metallic density right MCA bifurcation aneurysm clip is seen within the anterolateral aspect of the anterolateral temporoparietal region. Skull: A right frontotemporal craniotomy defect is seen. Sinuses/Orbits: No acute finding. Other: None. IMPRESSION: 1. No acute intracranial abnormality. 2. Right frontotemporal craniotomy defect with an adjacent metallic density right MCA bifurcation aneurysm clip. Electronically Signed   By: Virgina Norfolk M.D.   On: 10/28/2021 19:49   CT CHEST ABDOMEN PELVIS WO CONTRAST  Result Date: 10/28/2021 CLINICAL DATA:  Shortness of  breath with vomiting and diarrhea. EXAM: CT CHEST, ABDOMEN AND PELVIS WITHOUT CONTRAST TECHNIQUE: Multidetector CT imaging of the chest, abdomen and pelvis was performed following the standard protocol without IV contrast. RADIATION DOSE REDUCTION: This exam was performed according to the departmental dose-optimization program which includes automated exposure control, adjustment of the mA and/or kV according to patient size and/or use of iterative reconstruction technique. COMPARISON:  None Available. FINDINGS: CT CHEST FINDINGS Cardiovascular: There is mild calcification of the aortic arch, without evidence of aortic aneurysm. Normal heart size with marked severity coronary artery calcification. There is a trace amount of pericardial fluid. Mediastinum/Nodes: No enlarged mediastinal, hilar, or axillary lymph nodes. Thyroid gland, trachea, and esophagus demonstrate no significant findings. Lungs/Pleura: Lungs are clear. No pleural effusion or pneumothorax. Musculoskeletal: No chest wall mass or suspicious bone lesions identified. CT ABDOMEN PELVIS FINDINGS Hepatobiliary: No focal liver abnormality is seen. Status post cholecystectomy. No biliary dilatation. Pancreas: Unremarkable. No pancreatic ductal dilatation or surrounding inflammatory changes. Spleen: Normal in size without focal abnormality. Adrenals/Urinary Tract: Adrenal glands are unremarkable. Kidneys are normal in size. Multiple bilateral renal cysts are seen. No additional follow-up or imaging is recommended. Numerous bilateral subcentimeter non-obstructing renal calculi are also noted. Adjacent 2 mm and 3 mm obstructing renal calculi are seen within the distal right ureter (axial CT image 107, CT series 2). There is mild right-sided hydronephrosis, hydroureter and periureteral inflammatory fat stranding. Bladder is unremarkable. Stomach/Bowel: Stomach is within normal limits. The appendix is surgically absent no evidence of bowel wall thickening,  distention, or inflammatory changes. Vascular/Lymphatic: Aortic atherosclerosis. No enlarged abdominal or pelvic lymph nodes. Reproductive: Status post hysterectomy. No adnexal masses. Other: No abdominal wall hernia or abnormality. No abdominopelvic ascites. Musculoskeletal: No acute or significant osseous findings. IMPRESSION: 1. Adjacent 2 mm and 3 mm obstructing renal calculi within the distal right ureter. 2. Numerous bilateral subcentimeter non-obstructing renal calculi. 3. Marked severity coronary artery calcification. 4. Trace amount of pericardial fluid. 5. Aortic atherosclerosis. Aortic Atherosclerosis (ICD10-I70.0). Electronically Signed   By: Virgina Norfolk M.D.   On: 10/28/2021 19:53    ____________________________________________   PROCEDURES  Procedure(s) performed:   Procedures  CRITICAL CARE Performed by: Margette Fast Total critical care time: 75 minutes Critical care time was exclusive of separately billable procedures and treating other patients. Critical care was necessary to treat or prevent imminent or life-threatening deterioration. Critical care was time spent personally by me on the following activities: development of treatment plan with patient and/or surrogate as well as nursing, discussions with consultants, evaluation of patient's response to treatment, examination of patient,  obtaining history from patient or surrogate, ordering and performing treatments and interventions, ordering and review of laboratory studies, ordering and review of radiographic studies, pulse oximetry and re-evaluation of patient's condition.  Nanda Quinton, MD Emergency Medicine  ____________________________________________   INITIAL IMPRESSION / ASSESSMENT AND PLAN / ED COURSE  Pertinent labs & imaging results that were available during my care of the patient were reviewed by me and considered in my medical decision making (see chart for details).   This patient is Presenting for  Evaluation of abdominal pain, which does require a range of treatment options, and is a complaint that involves a high risk of morbidity and mortality.  The Differential Diagnoses includes but is not exclusive to acute cholecystitis, intrathoracic causes for epigastric abdominal pain, gastritis, duodenitis, pancreatitis, small bowel or large bowel obstruction, abdominal aortic aneurysm, hernia, gastritis, etc.   Critical Interventions-    Medications  insulin regular, human (MYXREDLIN) 100 units/ 100 mL infusion (3.2 Units/hr Intravenous Rate/Dose Change 10/28/21 2253)  lactated ringers infusion (0 mLs Intravenous Stopped 10/28/21 2039)  dextrose 5 % in lactated ringers infusion ( Intravenous Rate/Dose Change 10/28/21 2229)  dextrose 50 % solution 0-50 mL (has no administration in time range)  lactated ringers infusion (0 mLs Intravenous Hold 10/28/21 2042)  meropenem (MERREM) 1 g in sodium chloride 0.9 % 100 mL IVPB (0 g Intravenous Stopped 10/28/21 2304)  sodium chloride 0.9 % bolus 1,000 mL (0 mLs Intravenous Stopped 10/28/21 1929)  potassium chloride 10 mEq in 100 mL IVPB (0 mEq Intravenous Stopped 10/28/21 2227)    Reassessment after intervention: Tachycardia improving.    I did obtain Additional Historical Information from family at bedside.     Clinical Laboratory Tests Ordered, included patient with cytosis to 14.8.  No anemia.  She has some mild acute kidney injury at 1.38 creatinine and GFR 42.  CO2 of 19 with anion gap of 19.   Radiologic Tests Ordered, included CXR. I independently interpreted the images and agree with radiology interpretation.   Cardiac Monitor Tracing which shows sinus tachycardia.    Social Determinants of Health Risk patient is a former smoker.   Consult complete with  Dr. Tresa Moore with Urology. He will evaluate in the AM. Keep NPO in case intervention is required.   TRH, Dr. Myna Hidalgo. Plan for admit.  Medical Decision Making: Summary:  Patient presents to the  emergency department for evaluation of severe fatigue with abdominal discomfort, vomiting and diarrhea, headache.  She arrives tachycardic.  She is afebrile here but describing fevers at home.  Will need sepsis screening.  Would like to use contrast for imaging but plan for CT of the chest, abdomen, pelvis without contrast given some of her kidney injury.  Obtaining CT of the head given her history of aneurysm but low suspicion that this is the primary etiology of her symptoms.  She does appear to be in DKA on my initial assessment and I have started IV fluids in addition to insulin and potassium supplementation.   Reevaluation with update and discussion with patient and family. Repeat BMP shows gap is closing. HR improving. Plan for continued resuscitation and admit.   Disposition: Admit  ____________________________________________  FINAL CLINICAL IMPRESSION(S) / ED DIAGNOSES  Final diagnoses:  Diabetic ketoacidosis without coma associated with type 1 diabetes mellitus ([place])    Note:  This document was prepared using Dragon voice recognition software and may include unintentional dictation errors.  Nanda Quinton, MD, [facility] Emergency Medicine    Emmary Culbreath, [facility],  MD 10/28/21 2328

## 2021-10-28 NOTE — ED Provider Triage Note (Signed)
Emergency Medicine Provider Triage Evaluation Note  Laura Gentry , a 66 y.o. female  was evaluated in triage.  Pt complains of shortness of breath, vomiting, diarrhea, dehydration. Symptoms going on for 3 days. Cannot tolerate PO including medications. Feels SOB even at rest. No leg swelling, no chest pain.  Review of Systems  Positive: SOB, vomiting, diarrhea Negative: Chest pain, leg swelling  Physical Exam  BP 100/81 (BP Location: Left Arm)   Pulse (!) 144   Temp 97.9 F (36.6 C) (Oral)   Resp (!) 22   Ht '5\' 7"'$  (1.702 m)   Wt 69.7 kg   SpO2 100%   BMI 24.07 kg/m  Gen:   Awake, no distress   Resp:  Normal effort  MSK:   Moves extremities without difficulty  Other:  Tachycardic, dry MM  Medical Decision Making  Medically screening exam initiated at 6:09 PM.  Appropriate orders placed.  Laura Gentry was informed that the remainder of the evaluation will be completed by another provider, this initial triage assessment does not replace that evaluation, and the importance of remaining in the ED until their evaluation is complete.  Patient will need to be roomed soon, tachycardic   Delia Heady, PA-C 10/28/21 1811

## 2021-10-28 NOTE — ED Notes (Signed)
Called lab and spoke to shay to add lipase to previously drawn labs

## 2021-10-28 NOTE — Progress Notes (Signed)
Laura Gentry a 66 y.o. female admitted on 10/28/2021 with abdominal distress and extreme fatigue with concern for UTI.  Pharmacy has been consulted for meropenem dosing.  6/6: Scr 1.38, LA 2.1, WBC 14.8, tachycardic- other VSS. Patient noted to have an AG of 18 as she was not taking her insulin PTA d/t being unable to eat (possible DKA). Patient also noted to have a PCN allergy of unknown significance. Patient's last documented cephalosporin use was ceftriaxone in 2016.   Estimated Creatinine Clearance: 39 mL/min (A) (by C-G formula based on SCr of 1.38 mg/dL (H)).  Plan: START Meropenem 1 g IV Q12H  F/U Clinical course, renal function, de-escalation, C/S   Filed Weights   10/28/21 1645  Weight: 69.7 kg (153 lb 10.6 oz)    Antimicrobials this admission: meropenem 10/28/2021>>   Microbiology results: 6/6 Ucx: sent   Thank you for allowing pharmacy to be a part of this patient's care.  Adria Dill, PharmD PGY-1 Acute Care Resident  10/28/2021 8:10 PM

## 2021-10-28 NOTE — Consult Note (Signed)
Reason for Consult: Rt Ureteral + Bilateral Renal Stones, Rule Out Pyelonephritis  Referring Physician: Gara Kroner MD  Laura Gentry is an 66 y.o. female.   HPI:   1 - Rt Ureteral + Bilateral Renal Stones - 42m Rt distal ureteral stone with very mild hydro and punctate bilateral renal stones on ER CT 10/2021 on eval abdominal pain. C r 1.3. UA with pyuria, -nitirtes.  2 - Rule Out Pyelonephritis - subjective maliase at home, mild leukocytosis to 14k, lactate 2.1 by ER eval 10/2021. No fevers / hypotensision sepsis. Also in mild DKA. UCX 10/28/21 pending, placed on empiric Merrem.   PMH sig for IDDM, hyst, XXXX. Her PCP is XXX.  Today "Laura Gentry is seen in consultation for above. She is admitted with mild DKA and found to have small distal ureteral stone with ? Early infectious parameters.   Past Medical History:  Diagnosis Date   Brain aneurysm 03/19/2013   Cataracts, bilateral    Cerebral aneurysm    Colon polyps    Diabetes ([place] 2013   Headache    High cholesterol    History of kidney stones    Insomnia    Lumbosacral radiculopathy    Migraines    Osteoporosis    Skin cancer    squamous   Thyroid disease    Tremor     Past Surgical History:  Procedure Laterality Date   APPENDECTOMY     BRAIN SURGERY  03/19/13   [place] aneyrsum clip can have [place]    [place]   TONSILLECTOMY AND ADENOIDECTOMY     VAGINAL HYSTERECTOMY     At age [age]due to "growth"    Family History  Problem Relation Age of Onset   Asthma Mother    Allergies Mother    Diabetes Mother    Lung cancer Father        smoked   Throat cancer Father        smoked   Pancreatic cancer Brother    Hypertension Maternal Grandfather    Diabetes Maternal Grandfather    Emphysema Maternal Aunt        never smoked, spouse did   Breast cancer Maternal Aunt    Breast cancer Maternal Aunt    Breast cancer Paternal Aunt     Social  History:  reports that she quit smoking about 10 years ago. Her smoking use included cigarettes. She has a 20.00 pack-year smoking history. She has never used smokeless tobacco. She reports that she does not drink alcohol and does not use drugs.  Allergies:  Allergies  Allergen Reactions   Amoxicillin Shortness Of Breath   Bee Venom Shortness Of Breath   Penicillins Anaphylaxis    Has patient had a PCN reaction causing immediate rash, facial/tongue/throat swelling, SOB or lightheadedness with hypotension: {Yes} Has patient had a PCN reaction causing severe rash involving mucus membranes or skin necrosis: {No} Has patient had a PCN reaction that required hospitalization {No} Has patient had a PCN reaction occurring within the last 10 years: {No} If all of the above answers are "NO", then may proceed with Cephalosporin use.   Pneumococcal Vaccines     Per patient due to Brain Aneurysm   Sulfa Antibiotics Hives, Itching and Swelling   Bactrim [Sulfamethoxazole-Trimethoprim]     Felt off balance    Medications: {medication reviewed/display:[id number]}  Results for orders placed or performed  during the hospital encounter of 10/28/21 (from the past 48 hour(s))  CBC with Differential     Status: Abnormal   Collection Time: 10/28/21  4:52 PM  Result Value Ref Range   WBC 14.8 (H) 4.0 - 10.5 K/uL   RBC 5.98 (H) 3.87 - 5.11 MIL/uL   Hemoglobin 14.8 12.0 - 15.0 g/dL   HCT 45.0 36.0 - 46.0 %   MCV 75.3 (L) 80.0 - 100.0 fL   MCH 24.7 (L) 26.0 - 34.0 pg   MCHC 32.9 30.0 - 36.0 g/dL   RDW 14.7 11.5 - 15.5 %   Platelets 414 (H) 150 - 400 K/uL   nRBC 0.0 0.0 - 0.2 %   Neutrophils Relative % 71 %   Neutro Abs 10.5 (H) 1.7 - 7.7 K/uL   Lymphocytes Relative 22 %   Lymphs Abs 3.3 0.7 - 4.0 K/uL   Monocytes Relative 6 %   Monocytes Absolute 0.8 0.1 - 1.0 K/uL   Eosinophils Relative 0 %   Eosinophils Absolute 0.0 0.0 - 0.5 K/uL   Basophils Relative 1 %   Basophils Absolute 0.1 0.0 - 0.1 K/uL    Immature Granulocytes 0 %   Abs Immature Granulocytes 0.05 0.00 - 0.07 K/uL    Comment: Performed at [facility], [address] [place], [place] [address]  Comprehensive metabolic panel     Status: Abnormal   Collection Time: 10/28/21  4:52 PM  Result Value Ref Range   Sodium 133 (L) 135 - 145 mmol/L   Potassium 3.6 3.5 - 5.1 mmol/L   Chloride 95 (L) 98 - 111 mmol/L   CO2 19 (L) 22 - 32 mmol/L   Glucose, Bld 298 (H) 70 - 99 mg/dL    Comment: Glucose reference range applies only to samples taken after fasting for at least 8 hours.   BUN 26 (H) 8 - 23 mg/dL   Creatinine, Ser 1.38 (H) 0.44 - 1.00 mg/dL   Calcium 9.4 8.9 - 10.3 mg/dL   Total Protein 8.6 (H) 6.5 - 8.1 g/dL   Albumin 4.0 3.5 - 5.0 g/dL   AST 24 15 - 41 U/L   ALT 15 0 - 44 U/L   Alkaline Phosphatase 102 38 - 126 U/L   Total Bilirubin 2.0 (H) 0.3 - 1.2 mg/dL   GFR, Estimated 42 (L) >60 mL/min    Comment: (NOTE) Calculated using the CKD-EPI Creatinine Equation (2021)    Anion gap 19 (H) 5 - 15    Comment: Performed at [facility], [address] [place], [place] [address]  Lipase, blood     Status: None   Collection Time: 10/28/21  6:47 PM  Result Value Ref Range   Lipase 24 11 - 51 U/L    Comment: Performed at [facility], [address] [place], [place] [address]  Lactic acid, plasma     Status: Abnormal   Collection Time: 10/28/21  6:47 PM  Result Value Ref Range   Lactic Acid, Venous 2.1 (HH) 0.5 - 1.9 mmol/L    Comment: CRITICAL RESULT CALLED TO, READ BACK BY AND VERIFIED WITH: LISA M. RN AT 1932 ON 10/28/21  BY [email or web address] Performed at [facility], [address] [place], [place] [address]   SARS Coronavirus 2 by RT PCR (hospital order, performed in [facility] hospital lab) *cepheid single result test* Anterior Nasal Swab     Status: None   Collection Time: 10/28/21  6:55 PM   Specimen: Anterior Nasal Swab  Result Value Ref Range   SARS Coronavirus 2 by RT PCR  NEGATIVE NEGATIVE    Comment: (NOTE) SARS-CoV-2 target nucleic acids are NOT DETECTED.  The SARS-CoV-2 RNA is generally detectable in upper and lower respiratory specimens during the acute phase of infection. The lowest concentration of SARS-CoV-2 viral copies this assay can detect is 250 copies / mL. A negative result does not preclude SARS-CoV-2 infection and should not be used as the sole basis for treatment or other patient management decisions.  A negative result may occur with improper specimen collection / handling, submission of specimen other than nasopharyngeal swab, presence of viral mutation(s) within the areas targeted by this assay, and inadequate number of viral copies (<250 copies / mL). A negative result must be combined with clinical observations, patient history, and epidemiological information.  Fact Sheet for Patients:   https://www.patel.info/  Fact Sheet for Healthcare Providers: https://hall.com/  This test is not yet approved or  cleared by the [place] FDA and has been authorized for detection and/or diagnosis of SARS-CoV-2 by FDA under an Emergency Use Authorization (EUA).  This EUA will remain in effect (meaning this test can be used) for the duration of the COVID-19 declaration under Section 564(b)(1) of the Act, 21 U.S.C. section 360bbb-3(b)(1), unless the authorization is terminated or revoked sooner.  Performed at [facility], [address] [place], [place] [address]   POC CBG, ED     Status: Abnormal   Collection Time: 10/28/21  7:14 PM  Result Value Ref Range   Glucose-Capillary 266 (H) 70 - 99 mg/dL    Comment: Glucose reference range applies only to samples taken after fasting for at least 8 hours.  Basic metabolic panel     Status: Abnormal   Collection Time: 10/28/21  8:13 PM  Result Value Ref Range   Sodium 135 135 - 145 mmol/L   Potassium 3.4 (L) 3.5 - 5.1 mmol/L   Chloride  103 98 - 111 mmol/L   CO2 21 (L) 22 - 32 mmol/L   Glucose, Bld 235 (H) 70 - 99 mg/dL    Comment: Glucose reference range applies only to samples taken after fasting for at least 8 hours.   BUN 25 (H) 8 - 23 mg/dL   Creatinine, Ser 1.30 (H) 0.44 - 1.00 mg/dL   Calcium 8.2 (L) 8.9 - 10.3 mg/dL   GFR, Estimated 45 (L) >60 mL/min    Comment: (NOTE) Calculated using the CKD-EPI Creatinine Equation (2021)    Anion gap 11 5 - 15    Comment: Performed at [facility], [address] [place], [place] [address]  CBG monitoring, ED     Status: Abnormal   Collection Time: 10/28/21  8:35 PM  Result Value Ref Range   Glucose-Capillary 227 (H) 70 - 99 mg/dL    Comment: Glucose reference range applies only to samples taken after fasting for at least 8 hours.  Urinalysis, Routine w reflex microscopic Urine, Clean Catch     Status: Abnormal   Collection Time: 10/28/21  8:45 PM  Result Value Ref Range   Color, Urine YELLOW YELLOW   APPearance CLOUDY (A) CLEAR   Specific Gravity, Urine >=1.030 1.005 - 1.030   pH 5.0 5.0 - 8.0   Glucose, UA 100 (A) NEGATIVE mg/dL   Hgb urine dipstick LARGE (A) NEGATIVE   Bilirubin Urine MODERATE (A) NEGATIVE   Ketones, ur >=80 (A)  NEGATIVE mg/dL   Protein, ur 100 (A) NEGATIVE mg/dL   Nitrite NEGATIVE NEGATIVE   Leukocytes,Ua SMALL (A) NEGATIVE    Comment: Performed at [facility], [address] [place], [place] [address]  Urinalysis, Microscopic (reflex)     Status: Abnormal   Collection Time: 10/28/21  8:45 PM  Result Value Ref Range   RBC / HPF >50 0 - 5 RBC/hpf   WBC, UA 6-10 0 - 5 WBC/hpf   Bacteria, UA FEW (A) NONE SEEN   Squamous Epithelial / LPF 6-10 0 - 5    Comment: Performed at [facility], [address] [place], [place] [address]  CBG monitoring, ED     Status: Abnormal   Collection Time: 10/28/21  9:43 PM  Result Value Ref Range   Glucose-Capillary 190 (H) 70 - 99 mg/dL    Comment: Glucose reference range  applies only to samples taken after fasting for at least 8 hours.    DG Chest 2 View  Result Date: 10/28/2021 CLINICAL DATA:  Shortness of breath.  Tightness in chest. EXAM: CHEST - 2 VIEW COMPARISON:  PA chest and right rib radiographs 04/12/2018; chest two views 03/07/2015 FINDINGS: Cardiac silhouette and mediastinal contours are within normal limits with calcification again seen within the aortic arch. There is flattening of the diaphragms and moderate hyperinflation. Increased lucencies within the upper lungs are again seen suggesting chronic cystic emphysematous changes. No focal airspace opacity to indicate pneumonia. No pleural effusion or pneumothorax. Mild-to-moderate multilevel degenerative disc changes of the thoracic spine. Cholecystectomy clips. IMPRESSION: 1. Chronic hyperinflation and findings suggesting emphysematous change. 2. No acute lung process. Electronically Signed   By: Yvonne Kendall M.D.   On: 10/28/2021 17:11   CT Head Wo Contrast  Result Date: 10/28/2021 CLINICAL DATA:  Shortness of breath with vomiting and diarrhea. EXAM: CT HEAD WITHOUT CONTRAST TECHNIQUE: Contiguous axial images were obtained from the base of the skull through the vertex without intravenous contrast. RADIATION DOSE REDUCTION: This exam was performed according to the departmental dose-optimization program which includes automated exposure control, adjustment of the mA and/or kV according to patient size and/or use of iterative reconstruction technique. COMPARISON:  Oct 08, 2021 FINDINGS: Brain: No evidence of acute infarction, hemorrhage, hydrocephalus, extra-axial collection or mass lesion/mass effect. Vascular: A metallic density right MCA bifurcation aneurysm clip is seen within the anterolateral aspect of the anterolateral temporoparietal region. Skull: A right frontotemporal craniotomy defect is seen. Sinuses/Orbits: No acute finding. Other: None. IMPRESSION: 1. No acute intracranial abnormality. 2. Right  frontotemporal craniotomy defect with an adjacent metallic density right MCA bifurcation aneurysm clip. Electronically Signed   By: Virgina Norfolk M.D.   On: 10/28/2021 19:49   CT CHEST ABDOMEN PELVIS WO CONTRAST  Result Date: 10/28/2021 CLINICAL DATA:  Shortness of breath with vomiting and diarrhea. EXAM: CT CHEST, ABDOMEN AND PELVIS WITHOUT CONTRAST TECHNIQUE: Multidetector CT imaging of the chest, abdomen and pelvis was performed following the standard protocol without IV contrast. RADIATION DOSE REDUCTION: This exam was performed according to the departmental dose-optimization program which includes automated exposure control, adjustment of the mA and/or kV according to patient size and/or use of iterative reconstruction technique. COMPARISON:  None Available. FINDINGS: CT CHEST FINDINGS Cardiovascular: There is mild calcification of the aortic arch, without evidence of aortic aneurysm. Normal heart size with marked severity coronary artery calcification. There is a trace amount of pericardial fluid. Mediastinum/Nodes: No enlarged mediastinal, hilar, or axillary lymph nodes. Thyroid gland,  trachea, and esophagus demonstrate no significant findings. Lungs/Pleura: Lungs are clear. No pleural effusion or pneumothorax. Musculoskeletal: No chest wall mass or suspicious bone lesions identified. CT ABDOMEN PELVIS FINDINGS Hepatobiliary: No focal liver abnormality is seen. Status post cholecystectomy. No biliary dilatation. Pancreas: Unremarkable. No pancreatic ductal dilatation or surrounding inflammatory changes. Spleen: Normal in size without focal abnormality. Adrenals/Urinary Tract: Adrenal glands are unremarkable. Kidneys are normal in size. Multiple bilateral renal cysts are seen. No additional follow-up or imaging is recommended. Numerous bilateral subcentimeter non-obstructing renal calculi are also noted. Adjacent 2 mm and 3 mm obstructing renal calculi are seen within the distal right ureter (axial CT  image 107, CT series 2). There is mild right-sided hydronephrosis, hydroureter and periureteral inflammatory fat stranding. Bladder is unremarkable. Stomach/Bowel: Stomach is within normal limits. The appendix is surgically absent no evidence of bowel wall thickening, distention, or inflammatory changes. Vascular/Lymphatic: Aortic atherosclerosis. No enlarged abdominal or pelvic lymph nodes. Reproductive: Status post hysterectomy. No adnexal masses. Other: No abdominal wall hernia or abnormality. No abdominopelvic ascites. Musculoskeletal: No acute or significant osseous findings. IMPRESSION: 1. Adjacent 2 mm and 3 mm obstructing renal calculi within the distal right ureter. 2. Numerous bilateral subcentimeter non-obstructing renal calculi. 3. Marked severity coronary artery calcification. 4. Trace amount of pericardial fluid. 5. Aortic atherosclerosis. Aortic Atherosclerosis (ICD10-I70.0). Electronically Signed   By: Virgina Norfolk M.D.   On: 10/28/2021 19:53    Review of Systems Blood pressure 131/75, pulse (!) 109, temperature 97.9 F (36.6 C), temperature source Oral, resp. rate (!) 23, height '5\' 7"'$  (1.702 m), weight 69.7 kg, SpO2 100 %. Physical Exam  Assessment/Plan:  Unclear if stone and / or GU infection primary driver of present symptoms or incidental / multifactorial. I feel safest management would be to proceed with renal decompression with BILATERAL ureteral stents, then bilateral ureteroscopy in elective setting after clears infectious parameters. Agree with current ABX pending CX data. Risks, benefits, alternatives, expected peri-op course and rationale for staged approach discussed.     Alexis Frock 10/28/2021, 10:39 PM

## 2021-10-29 ENCOUNTER — Other Ambulatory Visit: Payer: Self-pay

## 2021-10-29 ENCOUNTER — Encounter (HOSPITAL_COMMUNITY): Payer: Self-pay | Admitting: Family Medicine

## 2021-10-29 ENCOUNTER — Inpatient Hospital Stay (HOSPITAL_COMMUNITY): Payer: Medicare HMO

## 2021-10-29 ENCOUNTER — Inpatient Hospital Stay (HOSPITAL_COMMUNITY): Payer: Medicare HMO | Admitting: Anesthesiology

## 2021-10-29 ENCOUNTER — Encounter (HOSPITAL_COMMUNITY)
Admission: EM | Disposition: A | Discharge status: Home / Self Care | Payer: Self-pay | Source: Ambulatory Visit | Attending: Internal Medicine

## 2021-10-29 DIAGNOSIS — E1110 Type 2 diabetes mellitus with ketoacidosis without coma: Secondary | ICD-10-CM | POA: Diagnosis present

## 2021-10-29 DIAGNOSIS — Z87442 Personal history of urinary calculi: Secondary | ICD-10-CM | POA: Diagnosis not present

## 2021-10-29 DIAGNOSIS — Z800 Family history of malignant neoplasm of digestive organs: Secondary | ICD-10-CM | POA: Diagnosis not present

## 2021-10-29 DIAGNOSIS — E119 Type 2 diabetes mellitus without complications: Secondary | ICD-10-CM | POA: Diagnosis not present

## 2021-10-29 DIAGNOSIS — N179 Acute kidney failure, unspecified: Secondary | ICD-10-CM | POA: Diagnosis present

## 2021-10-29 DIAGNOSIS — Z8719 Personal history of other diseases of the digestive system: Secondary | ICD-10-CM | POA: Diagnosis not present

## 2021-10-29 DIAGNOSIS — N202 Calculus of kidney with calculus of ureter: Secondary | ICD-10-CM | POA: Diagnosis not present

## 2021-10-29 DIAGNOSIS — A419 Sepsis, unspecified organism: Secondary | ICD-10-CM | POA: Diagnosis present

## 2021-10-29 DIAGNOSIS — Z833 Family history of diabetes mellitus: Secondary | ICD-10-CM | POA: Diagnosis not present

## 2021-10-29 DIAGNOSIS — Z803 Family history of malignant neoplasm of breast: Secondary | ICD-10-CM | POA: Diagnosis not present

## 2021-10-29 DIAGNOSIS — E7800 Pure hypercholesterolemia, unspecified: Secondary | ICD-10-CM | POA: Diagnosis present

## 2021-10-29 DIAGNOSIS — N132 Hydronephrosis with renal and ureteral calculous obstruction: Secondary | ICD-10-CM | POA: Diagnosis not present

## 2021-10-29 DIAGNOSIS — Z87891 Personal history of nicotine dependence: Secondary | ICD-10-CM | POA: Diagnosis not present

## 2021-10-29 DIAGNOSIS — N201 Calculus of ureter: Secondary | ICD-10-CM | POA: Diagnosis present

## 2021-10-29 DIAGNOSIS — M810 Age-related osteoporosis without current pathological fracture: Secondary | ICD-10-CM | POA: Diagnosis present

## 2021-10-29 DIAGNOSIS — Z801 Family history of malignant neoplasm of trachea, bronchus and lung: Secondary | ICD-10-CM | POA: Diagnosis not present

## 2021-10-29 DIAGNOSIS — Z1152 Encounter for screening for COVID-19: Secondary | ICD-10-CM | POA: Diagnosis not present

## 2021-10-29 DIAGNOSIS — E1010 Type 1 diabetes mellitus with ketoacidosis without coma: Secondary | ICD-10-CM | POA: Diagnosis not present

## 2021-10-29 DIAGNOSIS — N136 Pyonephrosis: Secondary | ICD-10-CM | POA: Diagnosis present

## 2021-10-29 DIAGNOSIS — N390 Urinary tract infection, site not specified: Secondary | ICD-10-CM | POA: Diagnosis not present

## 2021-10-29 DIAGNOSIS — E039 Hypothyroidism, unspecified: Secondary | ICD-10-CM | POA: Diagnosis present

## 2021-10-29 DIAGNOSIS — N3000 Acute cystitis without hematuria: Secondary | ICD-10-CM | POA: Diagnosis not present

## 2021-10-29 DIAGNOSIS — N10 Acute tubulo-interstitial nephritis: Secondary | ICD-10-CM | POA: Diagnosis not present

## 2021-10-29 DIAGNOSIS — E0810 Diabetes mellitus due to underlying condition with ketoacidosis without coma: Secondary | ICD-10-CM | POA: Diagnosis not present

## 2021-10-29 DIAGNOSIS — N135 Crossing vessel and stricture of ureter without hydronephrosis: Secondary | ICD-10-CM | POA: Diagnosis not present

## 2021-10-29 DIAGNOSIS — Z85828 Personal history of other malignant neoplasm of skin: Secondary | ICD-10-CM | POA: Diagnosis not present

## 2021-10-29 DIAGNOSIS — I10 Essential (primary) hypertension: Secondary | ICD-10-CM | POA: Diagnosis present

## 2021-10-29 DIAGNOSIS — Z825 Family history of asthma and other chronic lower respiratory diseases: Secondary | ICD-10-CM | POA: Diagnosis not present

## 2021-10-29 DIAGNOSIS — I671 Cerebral aneurysm, nonruptured: Secondary | ICD-10-CM | POA: Diagnosis present

## 2021-10-29 DIAGNOSIS — Z794 Long term (current) use of insulin: Secondary | ICD-10-CM | POA: Diagnosis not present

## 2021-10-29 DIAGNOSIS — Z808 Family history of malignant neoplasm of other organs or systems: Secondary | ICD-10-CM | POA: Diagnosis not present

## 2021-10-29 DIAGNOSIS — R6520 Severe sepsis without septic shock: Secondary | ICD-10-CM | POA: Diagnosis present

## 2021-10-29 DIAGNOSIS — E860 Dehydration: Secondary | ICD-10-CM | POA: Diagnosis present

## 2021-10-29 DIAGNOSIS — R197 Diarrhea, unspecified: Secondary | ICD-10-CM | POA: Diagnosis present

## 2021-10-29 HISTORY — PX: CYSTOSCOPY WITH RETROGRADE PYELOGRAM, URETEROSCOPY AND STENT PLACEMENT: SHX5789

## 2021-10-29 LAB — COMPREHENSIVE METABOLIC PANEL
ALT: 10 U/L (ref 0–44)
AST: 18 U/L (ref 15–41)
Albumin: 3.3 g/dL — ABNORMAL LOW (ref 3.5–5.0)
Alkaline Phosphatase: 75 U/L (ref 38–126)
Anion gap: 10 (ref 5–15)
BUN: 23 mg/dL (ref 8–23)
CO2: 23 mmol/L (ref 22–32)
Calcium: 8.8 mg/dL — ABNORMAL LOW (ref 8.9–10.3)
Chloride: 106 mmol/L (ref 98–111)
Creatinine, Ser: 0.92 mg/dL (ref 0.44–1.00)
GFR, Estimated: >60 mL/min (ref >60)
Glucose, Bld: 153 mg/dL — ABNORMAL HIGH (ref 70–99)
Potassium: 3.2 mmol/L — ABNORMAL LOW (ref 3.5–5.1)
Sodium: 139 mmol/L (ref 135–145)
Total Bilirubin: 1.6 mg/dL — ABNORMAL HIGH (ref 0.3–1.2)
Total Protein: 6.7 g/dL (ref 6.5–8.1)

## 2021-10-29 LAB — LIPID PANEL
Cholesterol: 158 mg/dL (ref 0–200)
HDL: 29 mg/dL — ABNORMAL LOW (ref >40)
LDL Cholesterol: 94 mg/dL (ref 0–99)
Total CHOL/HDL Ratio: 5.4 RATIO
Triglycerides: 174 mg/dL — ABNORMAL HIGH (ref <150)
VLDL: 35 mg/dL (ref 0–40)

## 2021-10-29 LAB — CBC WITH DIFFERENTIAL/PLATELET
Abs Immature Granulocytes: 0.05 10*3/uL (ref 0.00–0.07)
Basophils Absolute: 0.1 10*3/uL (ref 0.0–0.1)
Basophils Relative: 1 %
Eosinophils Absolute: 0.1 10*3/uL (ref 0.0–0.5)
Eosinophils Relative: 1 %
HCT: 38.5 % (ref 36.0–46.0)
Hemoglobin: 12.6 g/dL (ref 12.0–15.0)
Immature Granulocytes: 0 %
Lymphocytes Relative: 31 %
Lymphs Abs: 3.7 10*3/uL (ref 0.7–4.0)
MCH: 25.4 pg — ABNORMAL LOW (ref 26.0–34.0)
MCHC: 32.7 g/dL (ref 30.0–36.0)
MCV: 77.6 fL — ABNORMAL LOW (ref 80.0–100.0)
Monocytes Absolute: 1.2 10*3/uL — ABNORMAL HIGH (ref 0.1–1.0)
Monocytes Relative: 10 %
Neutro Abs: 6.8 10*3/uL (ref 1.7–7.7)
Neutrophils Relative %: 57 %
Platelets: 282 10*3/uL (ref 150–400)
RBC: 4.96 MIL/uL (ref 3.87–5.11)
RDW: 14.8 % (ref 11.5–15.5)
WBC: 11.9 10*3/uL — ABNORMAL HIGH (ref 4.0–10.5)
nRBC: 0 % (ref 0.0–0.2)

## 2021-10-29 LAB — GLUCOSE, CAPILLARY
Glucose-Capillary: 148 mg/dL — ABNORMAL HIGH (ref 70–99)
Glucose-Capillary: 162 mg/dL — ABNORMAL HIGH (ref 70–99)
Glucose-Capillary: 153 mg/dL — ABNORMAL HIGH (ref 70–99)
Glucose-Capillary: 175 mg/dL — ABNORMAL HIGH (ref 70–99)
Glucose-Capillary: 177 mg/dL — ABNORMAL HIGH (ref 70–99)
Glucose-Capillary: 168 mg/dL — ABNORMAL HIGH (ref 70–99)
Glucose-Capillary: 187 mg/dL — ABNORMAL HIGH (ref 70–99)
Glucose-Capillary: 214 mg/dL — ABNORMAL HIGH (ref 70–99)
Glucose-Capillary: 189 mg/dL — ABNORMAL HIGH (ref 70–99)
Glucose-Capillary: 180 mg/dL — ABNORMAL HIGH (ref 70–99)

## 2021-10-29 LAB — PROTIME-INR
INR: 1 (ref 0.8–1.2)
Prothrombin Time: 13.4 seconds (ref 11.4–15.2)

## 2021-10-29 LAB — BASIC METABOLIC PANEL
Anion gap: 9 (ref 5–15)
BUN: 17 mg/dL (ref 8–23)
CO2: 22 mmol/L (ref 22–32)
Calcium: 8.5 mg/dL — ABNORMAL LOW (ref 8.9–10.3)
Chloride: 107 mmol/L (ref 98–111)
Creatinine, Ser: 0.75 mg/dL (ref 0.44–1.00)
GFR, Estimated: >60 mL/min (ref >60)
Glucose, Bld: 159 mg/dL — ABNORMAL HIGH (ref 70–99)
Potassium: 3.5 mmol/L (ref 3.5–5.1)
Sodium: 138 mmol/L (ref 135–145)

## 2021-10-29 LAB — URINE CULTURE

## 2021-10-29 LAB — TSH: TSH: 3.477 u[IU]/mL (ref 0.350–4.500)

## 2021-10-29 LAB — CBG MONITORING, ED: Glucose-Capillary: 182 mg/dL — ABNORMAL HIGH (ref 70–99)

## 2021-10-29 LAB — MRSA NEXT GEN BY PCR, NASAL: MRSA by PCR Next Gen: NOT DETECTED

## 2021-10-29 LAB — HEMOGLOBIN A1C
Hgb A1c MFr Bld: 8.8 % — ABNORMAL HIGH (ref 4.8–5.6)
Mean Plasma Glucose: 205.86 mg/dL

## 2021-10-29 LAB — BETA-HYDROXYBUTYRIC ACID
Beta-Hydroxybutyric Acid: 3.07 mmol/L — ABNORMAL HIGH (ref 0.05–0.27)
Beta-Hydroxybutyric Acid: 0.34 mmol/L — ABNORMAL HIGH (ref 0.05–0.27)

## 2021-10-29 LAB — MAGNESIUM: Magnesium: 1.7 mg/dL (ref 1.7–2.4)

## 2021-10-29 SURGERY — CYSTOURETEROSCOPY, WITH RETROGRADE PYELOGRAM AND STENT INSERTION
Anesthesia: General | Laterality: Bilateral

## 2021-10-29 MED ORDER — ONDANSETRON HCL 4 MG/2ML IJ SOLN
4.0000 mg | Freq: Four times a day (QID) | INTRAMUSCULAR | Status: DC | PRN
  Administered 2021-10-29: 4 mg via INTRAVENOUS
  Filled 2021-10-29: qty 2

## 2021-10-29 MED ORDER — ACETAMINOPHEN 650 MG RE SUPP: 650.0000 mg | Freq: Four times a day (QID) | RECTAL | Status: DC | PRN

## 2021-10-29 MED ORDER — INSULIN DETEMIR 100 UNIT/ML ~~LOC~~ SOLN
15.0000 [IU] | Freq: Every day | SUBCUTANEOUS | Status: DC
  Administered 2021-10-29 – 2021-11-01 (×4): 15 [IU] via SUBCUTANEOUS
  Filled 2021-10-29 (×4): qty 0.15

## 2021-10-29 MED ORDER — FENTANYL CITRATE (PF) 100 MCG/2ML IJ SOLN
25.0000 ug | INTRAMUSCULAR | Status: DC | PRN
  Administered 2021-10-29 – 2021-10-30 (×3): 25 ug via INTRAVENOUS
  Filled 2021-10-29 (×3): qty 2

## 2021-10-29 MED ORDER — SODIUM CHLORIDE 0.9 % IV SOLN
1.0000 g | INTRAVENOUS | Status: DC
  Administered 2021-10-29 – 2021-10-30 (×2): 1 g via INTRAVENOUS
  Filled 2021-10-29 (×3): qty 10

## 2021-10-29 MED ORDER — PHENYLEPHRINE HCL (PRESSORS) 10 MG/ML IV SOLN
INTRAVENOUS | Status: AC
  Filled 2021-10-29: qty 1

## 2021-10-29 MED ORDER — ACETAMINOPHEN 325 MG PO TABS: 650.0000 mg | ORAL_TABLET | Freq: Four times a day (QID) | ORAL | Status: DC | PRN

## 2021-10-29 MED ORDER — CHLORHEXIDINE GLUCONATE CLOTH 2 % EX PADS
6.0000 | MEDICATED_PAD | Freq: Every day | CUTANEOUS | Status: DC
  Administered 2021-10-29 – 2021-10-31 (×4): 6 via TOPICAL

## 2021-10-29 MED ORDER — SODIUM CHLORIDE 0.9 % IR SOLN
Status: DC | PRN
  Administered 2021-10-29: 3000 mL

## 2021-10-29 MED ORDER — FENTANYL CITRATE (PF) 100 MCG/2ML IJ SOLN
INTRAMUSCULAR | Status: DC | PRN
  Administered 2021-10-29: 50 ug via INTRAVENOUS
  Administered 2021-10-29 (×2): 25 ug via INTRAVENOUS

## 2021-10-29 MED ORDER — POTASSIUM CHLORIDE 10 MEQ/100ML IV SOLN
10.0000 meq | INTRAVENOUS | Status: AC
  Administered 2021-10-29 (×2): 10 meq via INTRAVENOUS
  Filled 2021-10-29 (×2): qty 100

## 2021-10-29 MED ORDER — LACTATED RINGERS IV SOLN: INTRAVENOUS | Status: DC | PRN

## 2021-10-29 MED ORDER — PROPRANOLOL HCL ER 60 MG PO CP24
60.0000 mg | ORAL_CAPSULE | Freq: Two times a day (BID) | ORAL | Status: DC
  Administered 2021-10-29 – 2021-11-01 (×7): 60 mg via ORAL
  Filled 2021-10-29 (×8): qty 1

## 2021-10-29 MED ORDER — PHENYLEPHRINE 80 MCG/ML (10ML) SYRINGE FOR IV PUSH (FOR BLOOD PRESSURE SUPPORT)
PREFILLED_SYRINGE | INTRAVENOUS | Status: DC | PRN
  Administered 2021-10-29 (×5): 80 ug via INTRAVENOUS

## 2021-10-29 MED ORDER — INSULIN ASPART 100 UNIT/ML IJ SOLN
0.0000 [IU] | INTRAMUSCULAR | Status: DC
  Administered 2021-10-29 (×2): 3 [IU] via SUBCUTANEOUS
  Administered 2021-10-29 – 2021-10-30 (×2): 5 [IU] via SUBCUTANEOUS
  Administered 2021-10-30: 2 [IU] via SUBCUTANEOUS
  Administered 2021-10-30 – 2021-10-31 (×6): 3 [IU] via SUBCUTANEOUS
  Administered 2021-10-31: 5 [IU] via SUBCUTANEOUS
  Administered 2021-10-31: 2 [IU] via SUBCUTANEOUS
  Administered 2021-10-31: 3 [IU] via SUBCUTANEOUS
  Administered 2021-11-01: 5 [IU] via SUBCUTANEOUS
  Administered 2021-11-01: 2 [IU] via SUBCUTANEOUS
  Administered 2021-11-01 (×2): 3 [IU] via SUBCUTANEOUS

## 2021-10-29 MED ORDER — PROPOFOL 10 MG/ML IV BOLUS
INTRAVENOUS | Status: DC | PRN
  Administered 2021-10-29: 150 mg via INTRAVENOUS

## 2021-10-29 MED ORDER — LORAZEPAM 2 MG/ML IJ SOLN
1.0000 mg | Freq: Once | INTRAMUSCULAR | Status: AC
Start: 2021-10-29 — End: 2021-10-29
  Administered 2021-10-29: 1 mg via INTRAVENOUS
  Filled 2021-10-29: qty 1

## 2021-10-29 MED ORDER — FENTANYL CITRATE (PF) 100 MCG/2ML IJ SOLN
INTRAMUSCULAR | Status: AC
  Filled 2021-10-29: qty 2

## 2021-10-29 MED ORDER — POTASSIUM CHLORIDE 10 MEQ/100ML IV SOLN
10.0000 meq | INTRAVENOUS | Status: AC
  Administered 2021-10-29 (×5): 10 meq via INTRAVENOUS
  Filled 2021-10-29 (×5): qty 100

## 2021-10-29 MED ORDER — LIDOCAINE 2% (20 MG/ML) 5 ML SYRINGE
INTRAMUSCULAR | Status: DC | PRN
  Administered 2021-10-29: 100 mg via INTRAVENOUS

## 2021-10-29 MED ORDER — MORPHINE SULFATE (PF) 2 MG/ML IV SOLN
2.0000 mg | INTRAVENOUS | Status: DC | PRN
  Administered 2021-10-29 (×2): 2 mg via INTRAVENOUS
  Filled 2021-10-29 (×2): qty 1

## 2021-10-29 MED ORDER — PROPOFOL 10 MG/ML IV BOLUS
INTRAVENOUS | Status: AC
  Filled 2021-10-29: qty 20

## 2021-10-29 MED ORDER — IOHEXOL 300 MG/ML  SOLN
INTRAMUSCULAR | Status: DC | PRN
  Administered 2021-10-29: 10 mL

## 2021-10-29 MED ORDER — NALOXONE HCL 0.4 MG/ML IJ SOLN: 0.4000 mg | INTRAMUSCULAR | Status: DC | PRN

## 2021-10-29 MED ORDER — ONDANSETRON HCL 4 MG/2ML IJ SOLN
INTRAMUSCULAR | Status: DC | PRN
  Administered 2021-10-29: 4 mg via INTRAVENOUS

## 2021-10-29 MED ORDER — FENTANYL CITRATE PF 50 MCG/ML IJ SOSY: 25.0000 ug | PREFILLED_SYRINGE | INTRAMUSCULAR | Status: DC | PRN

## 2021-10-29 MED ORDER — 0.9 % SODIUM CHLORIDE (POUR BTL) OPTIME
TOPICAL | Status: DC | PRN
  Administered 2021-10-29: 1000 mL

## 2021-10-29 MED ORDER — HYDRALAZINE HCL 20 MG/ML IJ SOLN
10.0000 mg | Freq: Four times a day (QID) | INTRAMUSCULAR | Status: DC | PRN
  Administered 2021-10-29: 10 mg via INTRAVENOUS
  Filled 2021-10-29: qty 1

## 2021-10-29 MED ORDER — ACETAMINOPHEN 10 MG/ML IV SOLN: 1000.0000 mg | Freq: Once | INTRAVENOUS | Status: DC | PRN

## 2021-10-29 SURGICAL SUPPLY — 24 items
BAG URO CATCHER STRL LF (MISCELLANEOUS) ×3 IMPLANT
BASKET LASER NITINOL 1.9FR (BASKET) IMPLANT
BSKT STON RTRVL 120 1.9FR (BASKET)
CATH URETL OPEN END 6FR 70 (CATHETERS) ×3 IMPLANT
CLOTH BEACON ORANGE TIMEOUT ST (SAFETY) ×3 IMPLANT
EXTRACTOR STONE 1.7FRX115CM (UROLOGICAL SUPPLIES) IMPLANT
GLOVE SURG LX 7.5 STRW (GLOVE) ×1
GLOVE SURG LX STRL 7.5 STRW (GLOVE) ×2 IMPLANT
GOWN SRG XL LVL 4 BRTHBL STRL (GOWNS) ×2 IMPLANT
GOWN STRL NON-REIN XL LVL4 (GOWNS) ×2
GUIDEWIRE ANG ZIPWIRE 038X150 (WIRE) ×3 IMPLANT
GUIDEWIRE STR DUAL SENSOR (WIRE) ×3 IMPLANT
KIT TURNOVER KIT A (KITS) IMPLANT
LASER FIB FLEXIVA PULSE ID 365 (Laser) IMPLANT
MANIFOLD NEPTUNE II (INSTRUMENTS) ×3 IMPLANT
PACK CYSTO (CUSTOM PROCEDURE TRAY) ×3 IMPLANT
SHEATH NAVIGATOR HD 11/13X28 (SHEATH) IMPLANT
SHEATH NAVIGATOR HD 11/13X36 (SHEATH) IMPLANT
STENT POLARIS 5FRX22 (STENTS) ×2 IMPLANT
TRACTIP FLEXIVA PULS ID 200XHI (Laser) IMPLANT
TRACTIP FLEXIVA PULSE ID 200 (Laser)
TUBE FEEDING 8FR 16IN STR KANG (MISCELLANEOUS) ×3 IMPLANT
TUBING CONNECTING 10 (TUBING) ×3 IMPLANT
TUBING UROLOGY SET (TUBING) ×3 IMPLANT

## 2021-10-29 NOTE — Brief Op Note (Signed)
10/29/2021  7:53 AM  PATIENT:  Laura Gentry  66 y.o. female  PRE-OPERATIVE DIAGNOSIS:  Stone  Plus UTI  POST-OPERATIVE DIAGNOSIS:  Stone  Plus UTI  PROCEDURE:  Procedure(s): CYSTOSCOPY WITH RETROGRADE PYELOGRAM, URETEROSCOPY AND STENT PLACEMENT (Bilateral)  SURGEON:  Surgeon(s) and Role:    * Alexis Frock, MD - Primary  PHYSICIAN ASSISTANT:   ASSISTANTS: none   ANESTHESIA:   general  EBL:  minimal   BLOOD ADMINISTERED:none  DRAINS: none   LOCAL MEDICATIONS USED:  NONE  SPECIMEN:  No Specimen  DISPOSITION OF SPECIMEN:  N/A  COUNTS:  YES  TOURNIQUET:  * No tourniquets in log *  DICTATION: .Other Dictation: Dictation Number [id number]  PLAN OF CARE: Admit to inpatient   PATIENT DISPOSITION:  PACU - hemodynamically stable.   Delay start of Pharmacological VTE agent (>24hrs) due to surgical blood loss or risk of bleeding: yes

## 2021-10-29 NOTE — Assessment & Plan Note (Signed)
#)   Obstructing right ureteral stones in the setting of presenting 3 to 4 days of nausea/vomiting associated with new onset right flank discomfort, subjective fever, with CT abdomen/pelvis showing 2 mm and 3 mm obstructing renal calculi within the distal right ureter with associated mild right-sided hydronephrosis, hydroureter, periureteral inflammatory fat stranding.  Appears associated with presenting severe sepsis due to suspected urinary tract infection wanting source control, as further detailed below.  EDP discussed the patient's case and imaging with the on-call urologist, Dr. Tresa Moore, Requested admission to the hospitalist service at [facility], where Dr. Tresa Moore, will formally consult and see the patient in morning.  He requested the patient be kept n.p.o. for potential ureteroscopy with stent placement at that time.  Not on any blood thinners as an outpatient.  No evidence to suggest acute MI or acute decompensated heart failure.  Therefore, no absolute contraindications to proceeding with proposed urologic procedure.    Plan: NPO.  Continuous IV fluids.  Check INR.  Urology consulted, as above.  Repeat CMP and CBC in the morning.  Monitor strict I's and O's and daily weights.  Prn IV fentanyl.  Prn IV Zofran.  Further evaluation management of suspected associated severe sepsis due to UTI, as further detailed below.

## 2021-10-29 NOTE — Anesthesia Procedure Notes (Signed)
Procedure Name: LMA Insertion Date/Time: 10/29/2021 7:30 AM Performed by: Lavina Hamman, CRNA Pre-anesthesia Checklist: Patient identified, Emergency Drugs available, Suction available and Patient being monitored Patient Re-evaluated:Patient Re-evaluated prior to induction Oxygen Delivery Method: Circle System Utilized Preoxygenation: Pre-oxygenation with 100% oxygen Induction Type: IV induction Ventilation: Mask ventilation without difficulty LMA: LMA with gastric port inserted LMA Size: 4.0 Number of attempts: 1 Airway Equipment and Method: Bite block Placement Confirmation: positive ETCO2 Tube secured with: Tape Dental Injury: Teeth and Oropharynx as per pre-operative assessment

## 2021-10-29 NOTE — Assessment & Plan Note (Signed)
 #(  Diabetic ketoacidosis: In the context of a documented history of insulin-dependent diabetes mellitus, the patient presents with 3 to 4 days of nausea, vomiting, elevated blood sugars into the 300s, with associated evidence of anion gap metabolic acidosis, positive ketones on urinalysis as well as elevated beta hydroxybutyric acid, as further quantified above.  Suspect that this is on the basis of notably dehydration from her nausea/vomiting, but also from physiologic stress as relates to her presenting severe sepsis due to urinary tract infection as a consequence of multiple obstructing right ureterolithiasis.  Received a 1 L normal saline bolus at [facility] ED and started on insulin drip.  In the setting of initial potassium 3.6, she also received 20 mg of IV potassium ensuing lactated Ringer's were transitioned to D5 LR given ensuing blood sugars less than 250.  Will continue his existing fluids, closely monitor ensuing anion gap via every 4 hours BMPs that been ordered through 9 AM on 10/29/2021, will also closely monitor ensuing serum potassium level.  Plan: Continue insulin drip as above.  Every hour Accu-Cheks.  IV fluids as above.  Every 4 hours BMPs through 9 AM, as above.  Add on hemoglobin A1c.  Further evaluation management is for sepsis due to UTI, as above.  Prn IV Zofran.  Check serum magnesium level.

## 2021-10-29 NOTE — Transfer of Care (Signed)
Immediate Anesthesia Transfer of Care Note  Patient: Laura Gentry  Procedure(s) Performed: Procedure(s): CYSTOSCOPY WITH RETROGRADE PYELOGRAM, URETEROSCOPY AND STENT PLACEMENT (Bilateral)  Patient Location: PACU  Anesthesia Type:General  Level of Consciousness:  sedated, patient cooperative and responds to stimulation  Airway & Oxygen Therapy:Patient Spontanous Breathing and Patient connected to face mask oxgen  Post-op Assessment:  Report given to PACU RN and Post -op Vital signs reviewed and stable  Post vital signs:  Reviewed and stable  Last Vitals:  Vitals:   10/29/21 0500 10/29/21 0600  BP: (!) 147/77 (!) 162/88  Pulse: (!) 103 (!) 109  Resp: 20 (!) 22  Temp:    SpO2: 34% 96%    Complications: No apparent anesthesia complications

## 2021-10-29 NOTE — Anesthesia Postprocedure Evaluation (Signed)
Anesthesia Post Note  Patient: Laura Gentry  Procedure(s) Performed: CYSTOSCOPY WITH RETROGRADE PYELOGRAM, URETEROSCOPY AND STENT PLACEMENT (Bilateral)     Patient location during evaluation: PACU Anesthesia Type: General Level of consciousness: awake and alert Pain management: pain level controlled Vital Signs Assessment: post-procedure vital signs reviewed and stable Respiratory status: spontaneous breathing, nonlabored ventilation, respiratory function stable and patient connected to nasal cannula oxygen Cardiovascular status: blood pressure returned to baseline and stable Postop Assessment: no apparent nausea or vomiting Anesthetic complications: no   No notable events documented.  Last Vitals:  Vitals:   10/29/21 1300 10/29/21 1400  BP: 137/61 132/70  Pulse: (!) 127 (!) 123  Resp: (!) 26 (!) 23  Temp:    SpO2: 96% 97%    Last Pain:  Vitals:   10/29/21 1400  TempSrc:   PainSc: 2                  Barnet Glasgow

## 2021-10-29 NOTE — Assessment & Plan Note (Signed)
 #)   Acute Kidney Injury: Presenting serum creatinine 1.38 relative to most recent prior value of 0.94 when checked in July 2019.  Suspect that this is multifactorial nature, with prerenal contributions from dehydration as a result of increased GI losses from nausea/vomiting and consequential decline in oral intake over the last few days as well as postrenal contribution from evidence of obstructive right ureterolithiasis, as above.  Presenting urinalysis with microscopy notable for 67 white blood cells, greater than 50 red blood cells, as further detailed above.   Plan: monitor strict I's & O's and daily weights. Attempt to avoid nephrotoxic agents. Refrain from NSAIDs. Repeat CMP in the morning. Check serum magnesium level.  Fluids, as above.  Further evaluation management of obstructing right ureterolithiasis, including urology consult, as above.

## 2021-10-29 NOTE — ED Notes (Signed)
Patient secured on carelink stretcher at this time.

## 2021-10-29 NOTE — ED Notes (Signed)
Patient removed from bedpan.  ?

## 2021-10-29 NOTE — Anesthesia Preprocedure Evaluation (Signed)
Anesthesia Evaluation  Patient identified by MRN, date of birth, ID band Patient awake    Reviewed: Allergy & Precautions, NPO status , Patient's Chart, lab work & pertinent test results  Airway Mallampati: II  TM Distance: >3 FB Neck ROM: Full    Dental  (+) Edentulous Upper, Edentulous Lower   Pulmonary neg pulmonary ROS, former smoker,    Pulmonary exam normal        Cardiovascular negative cardio ROS   Rhythm:Regular Rate:Normal     Neuro/Psych  Headaches, negative psych ROS   GI/Hepatic negative GI ROS, Neg liver ROS,   Endo/Other  diabetes, Type 2, Insulin DependentHypothyroidism   Renal/GU Renal disease   Bilateral ureteral calculi with UTI    Musculoskeletal negative musculoskeletal ROS (+)   Abdominal Normal abdominal exam  (+)   Peds  Hematology negative hematology ROS (+)   Anesthesia Other Findings   Reproductive/Obstetrics                             Anesthesia Physical Anesthesia Plan  ASA: 2 and emergent  Anesthesia Plan: General   Post-op Pain Management:    Induction: Intravenous  PONV Risk Score and Plan: 3 and Ondansetron, Dexamethasone and Treatment may vary due to age or medical condition  Airway Management Planned: Mask and LMA  Additional Equipment: None  Intra-op Plan:   Post-operative Plan: Extubation in OR  Informed Consent: I have reviewed the patients History and Physical, chart, labs and discussed the procedure including the risks, benefits and alternatives for the proposed anesthesia with the patient or authorized representative who has indicated his/her understanding and acceptance.     Dental advisory given  Plan Discussed with: CRNA  Anesthesia Plan Comments: (Lab Results      Component                Value               Date                      WBC                      11.9 (H)            10/29/2021                HGB                       12.6                10/29/2021                HCT                      38.5                10/29/2021                MCV                      77.6 (L)            10/29/2021                PLT                      282  10/29/2021           Lab Results      Component                Value               Date                      NA                       139                 10/29/2021                K                        3.2 (L)             10/29/2021                CO2                      23                  10/29/2021                GLUCOSE                  153 (H)             10/29/2021                BUN                      23                  10/29/2021                CREATININE               0.92                10/29/2021                CALCIUM                  8.8 (L)             10/29/2021                GFRNONAA                 >60                 10/29/2021          )        Anesthesia Quick Evaluation

## 2021-10-29 NOTE — Assessment & Plan Note (Signed)
 #)   Acquired hypothyroidism: Documented history of such, although it does not appear that the patient is currently on any supplemental thyroid hormone as an outpatient. While her presenting subjective fever and tachycardia are most likely features  of her severe sepsis due to UTI as above, will also check TSH level in the absence of any corresponding recent data point per chart review.   Plan: Check TSH.

## 2021-10-29 NOTE — Op Note (Unsigned)
NAME: AKIAH, BAUCH MEDICAL RECORD NO: [id number] ACCOUNT NO: [id number] DATE OF BIRTH: 27-Feb-1956 FACILITY: [facility] LOCATION: [facility] PHYSICIAN: Alexis Frock, MD  Operative Report   DATE OF PROCEDURE: 10/29/2021  SURGEON:  Alexis Frock, MD  PREOPERATIVE DIAGNOSES:  Right ureteral, bilateral renal stones, questionable urinary tract infection.  PROCEDURE PERFORMED: 1.  Cystoscopy with bilateral retrograde pyelograms, interpretation. 2.  Insertion of bilateral ureteral stents.  ESTIMATED BLOOD LOSS:  Nil.  SPECIMEN:  None.  COMPLICATIONS:  None.  FINDINGS: 1.  Mild right hydronephrosis. 2.  Right distal ureteral stone. 3.  Successful placement of bilateral ureteral stents.  INDICATIONS:  The patient is a pleasant 66 year old lady with history of recurrent urolithiasis.  She was found on workup of abdominal pain, mild DKA and questionable infectious parameters and right distal ureteral stone.  Options were discussed for  management including recommended path of renal decompression to allow for definitive clearance of infectious parameters before definitive stone management and she presents for this today with stenting.  Informed consent was obtained and placed in medical  record.  PROCEDURE IN DETAIL:  The patient being Laura Gentry verified and procedure being bilateral stent placement confirmed.  Procedure timeout was performed.  Intravenous antibiotics were administered.  General anesthesia was induced.  The patient was placed  into a low lithotomy position.  Sterile field was created, prepped and draped the patient's vagina, introitus, and proximal thighs using iodine.  Cystourethroscopy was performed using 21-French rigid cystoscope with offset lens.  Inspection of urinary  bladder revealed no diverticula, calcifications or papillary lesions.  There was still a mild amount of proteinaceous, but not grossly foul urine in the bladder.  The right ureteral orifice was cannulated  with a 6-French end-hole catheter, and right  retrograde pyelogram was obtained.  Right ureteropyelogram demonstrated single right ureter, single system right kidney with very mild hydronephrosis to distal ureter where a filling defect was noted consistent with known stone.  A sensor wire was advanced to lower pole of kidney over  which a new 5 x 22 Polaris type stent was placed using fluoroscopic guidance.  Good proximal and distal planes were noted.  Next, left retrograde pyelogram was obtained.  Left retrograde pyelogram demonstrated single left ureter, single system left kidney.  No filling defects or narrowing noted.  Sensor wire was advanced to lower pole over which a separate 5 x 22 Polaris type stent was placed using fluoroscopic guidance.   Good proximal and distal planes were noted.  Bladder was emptied per cystoscope.  Procedure was then terminated.  The patient tolerated the procedure well, no immediate perioperative complications.  The patient was taken to postanesthesia care unit in  stable condition.  We will plan for continued hospital admission. As she remained afebrile, no significant tachycardia or hypotension, decision was made not to leave the catheter in place.   NIK D: 10/29/2021 7:57:22 am T: 10/29/2021 10:11:00 am  JOB: [id number]/ [id number]

## 2021-10-29 NOTE — Assessment & Plan Note (Signed)
  #)   Severe sepsis due to UTI: In setting of the patient's presenting multiple days of subjective fever and presenting with obstructing right ureteral stones, there is concern for underlying urinary tract infection proximal to the acute obstructing ureteral stones, warranting source control.   SIRS criteria met via presenting leukocytosis, tachycardia. Lactic acid level: Initially elevated 2.1, with repeat value trending down to 1.3 following interval IV fluids. Of note, given the associated presence of suspected end organ damage in the form of concominant presenting lactic acidosis as well as acute kidney injury, criteria are met for pt's sepsis to be considered severe in nature. However, in the absence of lactic acid level that is greater than or equal to 4.0, and in the absence of any associated hypotension refractory to IVF's, there are no indications for administration of a 30 mL/kg IVF bolus at this time.   Additional ED work-up/management notable for: Urine culture collected followed by initiation of meropenem, which was prompted by inpatient pharmacy consultation given reported allergies to multiple antibiotic classes, as further detailed above.  Of note, meropenem was administered prior to collection of blood cultures.  No e/o additional infectious process at this time, including COVID-19 PCR negative, CT chest showed no evidence of acute process, including no evidence of infiltrate.    Plan: CBC w/ diff and CMP in AM.  Follow for urine culture.  Continue meropenem.  On-call urology consulted, as above, with potential ureteroscopy and stent placement later today, as above.  IV fluids, as above.

## 2021-10-29 NOTE — ED Notes (Signed)
Patient's sister updated on inpatient status per patient's request.

## 2021-10-29 NOTE — H&P (Signed)
History and Physical    PLEASE NOTE THAT DRAGON DICTATION SOFTWARE WAS USED IN THE CONSTRUCTION OF THIS NOTE.   Laura Gentry DVV:[id number] DOB: May 26, 1955 DOA: 10/28/2021  PCP: Seward Carol, MD  Patient coming from: home   I have personally briefly reviewed patient's old medical records in [place]  Chief Complaint: Nausea vomiting  HPI: Laura Gentry is a 66 y.o. female with medical history significant for prior kidney stones, insulin-dependent type 2 diabetes mellitus, who is admitted to [facility] on 10/28/2021 by way of transfer from [place] emergency department with obstructing right lithiasis after presenting from home to the latter complaining of nausea/vomiting.  The patient reports 3 to 4 days of intermittent nausea resulting in at least 2-3 episodes per day of nonbloody, nonbilious emesis over that timeframe, with most recent such episode of emesis occurring just prior to presenting to [place] emergency department earlier in the day.  She notes that this has been associated with intermittent sharp right-sided flank pain as well as some new onset gross hematuria in the absence of any overt dysuria.  She is also noted subjective fever over the last 1 to 2 days, in the absence of chills, full body rigors, or generalized myalgias.  In the context of her recent nausea/vomiting, the patient reports a significant decline in her oral intake over the course of the last few days.  No recent trauma or travel.  No recent diarrhea, melena, hematochezia, rash.  Over the last 1 to 2 days, she is also noted some mild shortness of breath at rest in the absence of any orthopnea, PND, or worsening of peripheral edema.  Not associate with any chest pain, cough, mopped assist, palpitations, diaphoresis, dizziness, presyncope, or syncope.  Denies any known chronic underlying pulmonary pathology.  She has a mild bilateral frontal lobe headache, which she  suspects is on the basis of dehydration and limited oral intake over the last few days.  Weakness, acute focal numbness, paresthesias, acute change in vision, slurred speech, dysarthria, or vertigo.  Technologist a history of insulin-dependent type 2 diabetes mellitus, with chart review revealing most recent hemoglobin A1c to be 6.9% in 11/2017.  Outpatient insulin regimen reported to be Lantus 25 units SQ nightly as well as metformin and canagliflozin.   Not on any blood thinners as an outpatient, including no aspirin.  She conveys a history of kidney stone several years ago.     [facility] ED Course:  Vital signs in the ED were notable for the following: Afebrile; initial heart rate 125 which decreased to 110 following interval IV fluids; initial blood pressure 100/81, which increased to 160/90 following IV fluids; return rate 15-23, oxygen saturation 97 to 100% on room air.  Labs were notable for the following: CMP notable for the following: Sodium 133, corrected to approximately 136 when taking into account concomitant hyperglycemia, potassium 3.6, bicarbonate 19, anion gap 19, BUN 26, creatinine 1.3 compared to most recent prior creatinine data point of 0.94 in July 2019, glucose 298, total bilirubin 2.0 otherwise liver enzymes within normal limits.  Lipase 24.  Beta hydroxybutyric acid 3.07.   CBC notable for white blood cell count 14,800 with 71% neutrophils, hemoglobin 14.8 initial lactate 2.1, with repeat value trending down to 1.3 following interval IV fluids.  Urinalysis associate with a cloudy appearing specimen, associated with 6-10 white blood cells, greater than 50 will red blood cells greater than 80 ketones and  small leukocyte esterase.  Urine culture collected prior to initiation of IV antibiotics as below.  COVID-19 PCR negative.  Imaging and additional notable ED work-up: 2 view chest x-ray showed no evidence of acute cardiopulmonary process.  CT chest, abdomen, pelvis  without contrast showed 2 mm and 3 mm obstructing renal calculi within the distal right ureter with associated mild right-sided hydronephrosis, hydroureter, periureteral inflammatory fat stranding but otherwise showed no additional evidence of acute abdominal or acute intrapelvic process, while CT chest showed trace pericardial effusion but otherwise no evidence of acute cardiopulmonary process, including no evidence of infiltrate, edema, effusion, or pneumothorax.  She initiated sinus tachycardia with heart rate 147, normal intervals, nonspecific ST ST depression in leads I, 2, v4-v6, no evidence of ST elevation or T wave changes.  EDP discussed the patient's case and imaging with the on-call urologist, Dr. Tresa Moore, Requested admission to the hospitalist service at [facility], where Dr. Tresa Moore, will formally consult and see the patient in morning.  He requested the patient be kept n.p.o. for potential ureteroscopy with stent placement at that time.  While in the ED, the following were administered: In the context of the patient's reported anaphylactic response to penicillin, EDP consulted inpatient pharmacist for recommendations regarding antibiotics, with ensuing recommendation for meropenem, which was started in the ED.  Additionally, insulin drip was started and patient received 1 L normal saline bolus followed by continuous LR at 125 cc/h, before changing these IV fluids to D5 LR following per Endo tool/dka protocol via improving blood sugar.  Potassium chloride 20 mill equivalents over 2 hours.   Subsequently, the patient was transferred to the stepdown unit at [facility] for further evaluation and management of presenting obstructing right ureteral stones associated with severe sepsis due to suspected urinary tract infection, in addition to further evaluation management of suspected presenting DKA, with labs also notable for acute kidney injury.    Review of Systems: As per HPI otherwise 10 point  review of systems negative.   Past Medical History:  Diagnosis Date   Brain aneurysm 03/19/2013   Cataracts, bilateral    Cerebral aneurysm    Colon polyps    Diabetes ([place]) 2013   Headache    High cholesterol    History of kidney stones    Insomnia    Lumbosacral radiculopathy    Migraines    Osteoporosis    Skin cancer    squamous   Thyroid disease    Tremor     Past Surgical History:  Procedure Laterality Date   APPENDECTOMY     BRAIN SURGERY  03/19/13   Sugita aneyrsum clip can have MRI up to Box scanned in    CHOLECYSTECTOMY  2013   INNER EAR SURGERY     Lost Hearing   TONSILLECTOMY AND ADENOIDECTOMY     VAGINAL HYSTERECTOMY     At age 66 due to "growth"    Social History:  reports that she quit smoking about 10 years ago. Her smoking use included cigarettes. She has a 20.00 pack-year smoking history. She has never used smokeless tobacco. She reports that she does not drink alcohol and does not use drugs.     Family History  Problem Relation Age of Onset   Asthma Mother    Allergies Mother    Diabetes Mother    Lung cancer Father        smoked   Throat cancer Father        smoked  Pancreatic cancer Brother    Hypertension Maternal Grandfather    Diabetes Maternal Grandfather    Emphysema Maternal Aunt        never smoked, spouse did   Breast cancer Maternal Aunt    Breast cancer Maternal Aunt    Breast cancer Paternal Aunt     Family history reviewed and not pertinent    Prior to Admission medications   Medication Sig Start Date End Date Taking? Authorizing Provider  Ascorbic Acid (VITAMIN C PO) Take by mouth.    [provider]  atorvastatin (LIPITOR) 40 MG tablet Take 1 tablet (40 mg total) by mouth daily. 01/31/18   Lorella Nimrod, MD  Blood Glucose Monitoring Suppl (ACCU-CHEK AVIVA PLUS) w/Device KIT The patient IS insulin requiring, ICD 10 code E11.9. The patient tests 4 times per day. 06/04/17   Lenore Cordia, MD  diclofenac  (VOLTAREN) 75 MG EC tablet 75 mg as needed. 02/27/20   [provider]  donepezil (ARICEPT) 10 MG tablet Take 2 tablets by mouth daily. 09/15/15   [provider]  gabapentin (NEURONTIN) 300 MG capsule Take 300 mg in AM, 300 mg at noon, 600 mg at bedtime Patient taking differently: Take 300 mg by mouth 3 (three) times daily. 09/21/16   Rivet, Sindy Guadeloupe, MD  glucose blood (ACCU-CHEK AVIVA PLUS) test strip Use as instructed. The patient IS insulin requiring, ICD 10 code E11.9. The patient tests 4 times per day. 09/23/16   Rivet, Sindy Guadeloupe, MD  Insulin Glargine (LANTUS SOLOSTAR) 100 UNIT/ML Solostar Pen Inject 25 Units into the skin at bedtime. 01/31/18   Lorella Nimrod, MD  Insulin Pen Needle 32G X 4 MM MISC Use to inject lantus once dialy 10/29/17   Lorella Nimrod, MD  INVOKAMET XR 50-1000 MG TB24 TAKE 2 TABLETS BY MOUTH DAILY WITH BREAKFAST 03/26/18   Lorella Nimrod, MD  lamoTRIgine (LAMICTAL) 200 MG tablet Take 100 mg by mouth 2 (two) times daily.    [provider]  Lancets (ACCU-CHEK SOFT TOUCH) lancets Use as instructed 09/23/16   Rivet, Sindy Guadeloupe, MD  propranolol ER (INDERAL LA) 120 MG 24 hr capsule Take 40 mg by mouth in the morning, at noon, and at bedtime. 09/17/15   [provider]  sertraline (ZOLOFT) 100 MG tablet Take 1 tablet (100 mg total) by mouth daily. 09/21/16   Juliet Rude, MD     Objective    Physical Exam: Vitals:   10/29/21 0015 10/29/21 0045 10/29/21 0100 10/29/21 0200  BP: 139/80 136/84 (!) 147/86 (!) 160/90  Pulse: (!) 111 (!) 107 (!) 110 (!) 113  Resp: 16 13 17  (!) 21  Temp:    98.3 F (36.8 C)  TempSrc:    Oral  SpO2: 97% 98% 100% 100%  Weight:    67.3 kg  Height:    5' 7"  (1.702 m)    General: appears to be stated age; alert, oriented Skin: warm, dry, no rash Head:  AT/[place] Mouth:  Oral mucosa membranes appear dry, normal dentition Neck: supple; trachea midline Heart:  RRR; did not appreciate any M/R/G Lungs: CTAB, did not appreciate any  wheezes, rales, or rhonchi Abdomen: + BS; soft, ND, NT Vascular: 2+ pedal pulses b/l; 2+ radial pulses b/l Extremities: no peripheral edema, no muscle wasting Neuro: strength and sensation intact in upper and lower extremities b/l      Labs on Admission: I have personally reviewed following labs and imaging studies  CBC: Recent Labs  Lab 10/28/21  1652 10/29/21 0254  WBC 14.8* 11.9*  NEUTROABS 10.5* 6.8  HGB 14.8 12.6  HCT 45.0 38.5  MCV 75.3* 77.6*  PLT 414* 939   Basic Metabolic Panel: Recent Labs  Lab 10/28/21 1652 10/28/21 2013  NA 133* 135  K 3.6 3.4*  CL 95* 103  CO2 19* 21*  GLUCOSE 298* 235*  BUN 26* 25*  CREATININE 1.38* 1.30*  CALCIUM 9.4 8.2*   GFR: Estimated Creatinine Clearance: 41.4 mL/min (A) (by C-G formula based on SCr of 1.3 mg/dL (H)). Liver Function Tests: Recent Labs  Lab 10/28/21 1652  AST 24  ALT 15  ALKPHOS 102  BILITOT 2.0*  PROT 8.6*  ALBUMIN 4.0   Recent Labs  Lab 10/28/21 1847  LIPASE 24   No results for input(s): AMMONIA in the last 168 hours. Coagulation Profile: No results for input(s): INR, PROTIME in the last 168 hours. Cardiac Enzymes: No results for input(s): CKTOTAL, CKMB, CKMBINDEX, TROPONINI in the last 168 hours. BNP (last 3 results) No results for input(s): PROBNP in the last 8760 hours. HbA1C: No results for input(s): HGBA1C in the last 72 hours. CBG: Recent Labs  Lab 10/28/21 2249 10/28/21 2355 10/29/21 0059 10/29/21 0215 10/29/21 0313  GLUCAP 193* 179* 182* 148* 162*   Lipid Profile: No results for input(s): CHOL, HDL, LDLCALC, TRIG, CHOLHDL, LDLDIRECT in the last 72 hours. Thyroid Function Tests: No results for input(s): TSH, T4TOTAL, FREET4, T3FREE, THYROIDAB in the last 72 hours. Anemia Panel: No results for input(s): VITAMINB12, FOLATE, FERRITIN, TIBC, IRON, RETICCTPCT in the last 72 hours. Urine analysis:    Component Value Date/Time   COLORURINE YELLOW 10/28/2021 2045   APPEARANCEUR  CLOUDY (A) 10/28/2021 2045   LABSPEC >=1.030 10/28/2021 2045   PHURINE 5.0 10/28/2021 2045   GLUCOSEU 100 (A) 10/28/2021 2045   HGBUR LARGE (A) 10/28/2021 2045   BILIRUBINUR MODERATE (A) 10/28/2021 2045   BILIRUBINUR small 12/31/2016 1710   KETONESUR >=80 (A) 10/28/2021 2045   PROTEINUR 100 (A) 10/28/2021 2045   UROBILINOGEN 0.2 12/31/2016 1710   UROBILINOGEN 0.2 03/07/2015 1354   NITRITE NEGATIVE 10/28/2021 2045   LEUKOCYTESUR SMALL (A) 10/28/2021 2045    Radiological Exams on Admission: DG Chest 2 View  Result Date: 10/28/2021 CLINICAL DATA:  Shortness of breath.  Tightness in chest. EXAM: CHEST - 2 VIEW COMPARISON:  PA chest and right rib radiographs 04/12/2018; chest two views 03/07/2015 FINDINGS: Cardiac silhouette and mediastinal contours are within normal limits with calcification again seen within the aortic arch. There is flattening of the diaphragms and moderate hyperinflation. Increased lucencies within the upper lungs are again seen suggesting chronic cystic emphysematous changes. No focal airspace opacity to indicate pneumonia. No pleural effusion or pneumothorax. Mild-to-moderate multilevel degenerative disc changes of the thoracic spine. Cholecystectomy clips. IMPRESSION: 1. Chronic hyperinflation and findings suggesting emphysematous change. 2. No acute lung process. Electronically Signed   By: Yvonne Kendall M.D.   On: 10/28/2021 17:11   CT Head Wo Contrast  Result Date: 10/28/2021 CLINICAL DATA:  Shortness of breath with vomiting and diarrhea. EXAM: CT HEAD WITHOUT CONTRAST TECHNIQUE: Contiguous axial images were obtained from the base of the skull through the vertex without intravenous contrast. RADIATION DOSE REDUCTION: This exam was performed according to the departmental dose-optimization program which includes automated exposure control, adjustment of the mA and/or kV according to patient size and/or use of iterative reconstruction technique. COMPARISON:  Oct 08, 2021  FINDINGS: Brain: No evidence of acute infarction, hemorrhage, hydrocephalus, extra-axial collection or mass  lesion/mass effect. Vascular: A metallic density right MCA bifurcation aneurysm clip is seen within the anterolateral aspect of the anterolateral temporoparietal region. Skull: A right frontotemporal craniotomy defect is seen. Sinuses/Orbits: No acute finding. Other: None. IMPRESSION: 1. No acute intracranial abnormality. 2. Right frontotemporal craniotomy defect with an adjacent metallic density right MCA bifurcation aneurysm clip. Electronically Signed   By: Virgina Norfolk M.D.   On: 10/28/2021 19:49   CT CHEST ABDOMEN PELVIS WO CONTRAST  Result Date: 10/28/2021 CLINICAL DATA:  Shortness of breath with vomiting and diarrhea. EXAM: CT CHEST, ABDOMEN AND PELVIS WITHOUT CONTRAST TECHNIQUE: Multidetector CT imaging of the chest, abdomen and pelvis was performed following the standard protocol without IV contrast. RADIATION DOSE REDUCTION: This exam was performed according to the departmental dose-optimization program which includes automated exposure control, adjustment of the mA and/or kV according to patient size and/or use of iterative reconstruction technique. COMPARISON:  None Available. FINDINGS: CT CHEST FINDINGS Cardiovascular: There is mild calcification of the aortic arch, without evidence of aortic aneurysm. Normal heart size with marked severity coronary artery calcification. There is a trace amount of pericardial fluid. Mediastinum/Nodes: No enlarged mediastinal, hilar, or axillary lymph nodes. Thyroid gland, trachea, and esophagus demonstrate no significant findings. Lungs/Pleura: Lungs are clear. No pleural effusion or pneumothorax. Musculoskeletal: No chest wall mass or suspicious bone lesions identified. CT ABDOMEN PELVIS FINDINGS Hepatobiliary: No focal liver abnormality is seen. Status post cholecystectomy. No biliary dilatation. Pancreas: Unremarkable. No pancreatic ductal dilatation or  surrounding inflammatory changes. Spleen: Normal in size without focal abnormality. Adrenals/Urinary Tract: Adrenal glands are unremarkable. Kidneys are normal in size. Multiple bilateral renal cysts are seen. No additional follow-up or imaging is recommended. Numerous bilateral subcentimeter non-obstructing renal calculi are also noted. Adjacent 2 mm and 3 mm obstructing renal calculi are seen within the distal right ureter (axial CT image 107, CT series 2). There is mild right-sided hydronephrosis, hydroureter and periureteral inflammatory fat stranding. Bladder is unremarkable. Stomach/Bowel: Stomach is within normal limits. The appendix is surgically absent no evidence of bowel wall thickening, distention, or inflammatory changes. Vascular/Lymphatic: Aortic atherosclerosis. No enlarged abdominal or pelvic lymph nodes. Reproductive: Status post hysterectomy. No adnexal masses. Other: No abdominal wall hernia or abnormality. No abdominopelvic ascites. Musculoskeletal: No acute or significant osseous findings. IMPRESSION: 1. Adjacent 2 mm and 3 mm obstructing renal calculi within the distal right ureter. 2. Numerous bilateral subcentimeter non-obstructing renal calculi. 3. Marked severity coronary artery calcification. 4. Trace amount of pericardial fluid. 5. Aortic atherosclerosis. Aortic Atherosclerosis (ICD10-I70.0). Electronically Signed   By: Virgina Norfolk M.D.   On: 10/28/2021 19:53     EKG: Independently reviewed, with result as described above.    Assessment/Plan    Principal Problem:   Ureteral obstruction, right Active Problems:   UTI (urinary tract infection)   Severe sepsis (HCC)   DKA (diabetic ketoacidosis) (HCC)   AKI (acute kidney injury) ([place])   Acquired hypothyroidism     #) Obstructing right ureteral stones in the setting of presenting 3 to 4 days of nausea/vomiting associated with new onset right flank discomfort, subjective fever, with CT abdomen/pelvis showing 2 mm and  3 mm obstructing renal calculi within the distal right ureter with associated mild right-sided hydronephrosis, hydroureter, periureteral inflammatory fat stranding.  Appears associated with presenting severe sepsis due to suspected urinary tract infection wanting source control, as further detailed below.  EDP discussed the patient's case and imaging with the on-call urologist, Dr. Tresa Moore, Requested admission to the hospitalist service at [facility], where Dr. Tresa Moore, will formally consult and see the patient in morning.  He requested the patient be kept n.p.o. for potential ureteroscopy with stent placement at that time.  Not on any blood thinners as an outpatient.  No evidence to suggest acute MI or acute decompensated heart failure.  Therefore, no absolute contraindications to proceeding with proposed urologic procedure.    Plan: NPO.  Continuous IV fluids.  Check INR.  Urology consulted, as above.  Repeat CMP and CBC in the morning.  Monitor strict I's and O's and daily weights.  Prn IV fentanyl.  Prn IV Zofran.  Further evaluation management of suspected associated severe sepsis due to UTI, as further detailed below.          #) Severe sepsis due to UTI: In setting of the patient's presenting multiple days of subjective fever and presenting with obstructing right ureteral stones, there is concern for underlying urinary tract infection proximal to the acute obstructing ureteral stones, warranting source control.   SIRS criteria met via presenting leukocytosis, tachycardia. Lactic acid level: Initially elevated 2.1, with repeat value trending down to 1.3 following interval IV fluids. Of note, given the associated presence of suspected end organ damage in the form of concominant presenting lactic acidosis as well as acute kidney injury, criteria are met for pt's sepsis to be considered severe in nature. However, in the absence of lactic acid level that is greater than or equal to 4.0, and in the  absence of any associated hypotension refractory to IVF's, there are no indications for administration of a 30 mL/kg IVF bolus at this time.   Additional ED work-up/management notable for: Urine culture collected followed by initiation of meropenem, which was prompted by inpatient pharmacy consultation given reported allergies to multiple antibiotic classes, as further detailed above.  Of note, meropenem was administered prior to collection of blood cultures.  No e/o additional infectious process at this time, including COVID-19 PCR negative, CT chest showed no evidence of acute process, including no evidence of infiltrate.    Plan: CBC w/ diff and CMP in AM.  Follow for urine culture.  Continue meropenem.  On-call urology consulted, as above, with potential ureteroscopy and stent placement later today, as above.  IV fluids, as above.      #(Diabetic ketoacidosis: In the context of a documented history of insulin-dependent diabetes mellitus, the patient presents with 3 to 4 days of nausea, vomiting, elevated blood sugars into the 300s, with associated evidence of anion gap metabolic acidosis, positive ketones on urinalysis as well as elevated beta hydroxybutyric acid, as further quantified above.  Suspect that this is on the basis of notably dehydration from her nausea/vomiting, but also from physiologic stress as relates to her presenting severe sepsis due to urinary tract infection as a consequence of multiple obstructing right ureterolithiasis.  Received a 1 L normal saline bolus at [facility] ED and started on insulin drip.  In the setting of initial potassium 3.6, she also received 20 mg of IV potassium ensuing lactated Ringer's were transitioned to D5 LR given ensuing blood sugars less than 250.  Will continue his existing fluids, closely monitor ensuing anion gap via every 4 hours BMPs that been ordered through 9 AM on 10/29/2021, will also closely monitor ensuing serum potassium  level.  Plan: Continue insulin drip as above.  Every hour Accu-Cheks.  IV fluids as above.  Every 4 hours BMPs through 9 AM, as above.  Add on hemoglobin A1c.  Further evaluation management is for sepsis due to UTI, as above.  Prn IV Zofran.  Check serum magnesium level.          #) Acute Kidney Injury: Presenting serum creatinine 1.38 relative to most recent prior value of 0.94 when checked in July 2019.  Suspect that this is multifactorial nature, with prerenal contributions from dehydration as a result of increased GI losses from nausea/vomiting and consequential decline in oral intake over the last few days as well as postrenal contribution from evidence of obstructive right ureterolithiasis, as above.  Presenting urinalysis with microscopy notable for 67 white blood cells, greater than 50 red blood cells, as further detailed above.   Plan: monitor strict I's & O's and daily weights. Attempt to avoid nephrotoxic agents. Refrain from NSAIDs. Repeat CMP in the morning. Check serum magnesium level.  Fluids, as above.  Further evaluation management of obstructing right ureterolithiasis, including urology consult, as above.         #) Acquired hypothyroidism: Documented history of such, although it does not appear that the patient is currently on any supplemental thyroid hormone as an outpatient. While her presenting subjective fever and tachycardia are most likely features  of her severe sepsis due to UTI as above, will also check TSH level in the absence of any corresponding recent data point per chart review.   Plan: Check TSH.       DVT prophylaxis: SCD's   Code Status: Full code Family Communication: none Disposition Plan: Per Rounding Team Consults called: on-call urology, Dr. Tresa Moore, consulted, as abovd;  Admission status: inpatient    PLEASE NOTE THAT DRAGON DICTATION SOFTWARE WAS USED IN THE CONSTRUCTION OF THIS NOTE.   [place] DO [facility]  [place]   10/29/2021, 3:47 AM

## 2021-10-29 NOTE — Plan of Care (Signed)
Pt resting comfortably in bed, vital signs stable, last CBG 162 continuing endotool insulin gtt

## 2021-10-29 NOTE — Progress Notes (Signed)
Day of Surgery   Subjective/Chief Complaint:  1 - Recurrent Urolithiasis -  Pre 2023 - SWL x several, URS x 1 10/2021 - 44m Rt distal ureteral stone with very mild hydro and punctate bilateral renal stones on ER CT 10/2021 on eval abdominal pain. C r 1.3. UA with pyuria, -nitirtes.   2 - Rule Out Pyelonephritis - subjective maliase at home, mild leukocytosis to 14k, lactate 2.1 by ER eval 10/2021. No fevers / hypotensision sepsis. Also in mild DKA. UCX 10/28/21 pending, placed on empiric Merrem.     Today "Laura Gentry is seen to proceed with bilateral ureteral stenting. Improved leukocytosis and malaise but still some tachycardia and now low grade fevers, no significant hypotension.     Objective: Vital signs in last 24 hours: Temp:  [97.9 F (36.6 C)-99.3 F (37.4 C)] 98.7 F (37.1 C) (06/07 0456) Pulse Rate:  [103-144] 109 (06/07 0600) Resp:  [13-33] 22 (06/07 0600) BP: (100-165)/(61-100) 162/88 (06/07 0600) SpO2:  [96 %-100 %] 97 % (06/07 0600) Weight:  [67.3 kg-69.7 kg] 67.3 kg (06/07 0200) Last BM Date :  (PTA)  Intake/Output from previous day: 06/06 0701 - 06/07 0700 In: 19741[I.V.:1140.4; IV Piggyback:238.6] Out: -  Intake/Output this shift: Total I/O In: 1379 [I.V.:1140.4; IV Piggyback:238.6] Out: -   NAD, AOx3, some stable stigmata of prior neurologic surgery Mild regular tachycardia SNTND No CVAT at present No c/c/e.  Lab Results:  Recent Labs    10/28/21 1652 10/29/21 0254  WBC 14.8* 11.9*  HGB 14.8 12.6  HCT 45.0 38.5  PLT 414* 282   BMET Recent Labs    10/28/21 2013 10/29/21 0254  NA 135 139  K 3.4* 3.2*  CL 103 106  CO2 21* 23  GLUCOSE 235* 153*  BUN 25* 23  CREATININE 1.30* 0.92  CALCIUM 8.2* 8.8*   PT/INR Recent Labs    10/29/21 0549  LABPROT 13.4  INR 1.0   ABG No results for input(s): PHART, HCO3 in the last 72 hours.  Invalid input(s): PCO2, PO2  Studies/Results: DG Chest 2 View  Result Date: 10/28/2021 CLINICAL DATA:  Shortness  of breath.  Tightness in chest. EXAM: CHEST - 2 VIEW COMPARISON:  PA chest and right rib radiographs 04/12/2018; chest two views 03/07/2015 FINDINGS: Cardiac silhouette and mediastinal contours are within normal limits with calcification again seen within the aortic arch. There is flattening of the diaphragms and moderate hyperinflation. Increased lucencies within the upper lungs are again seen suggesting chronic cystic emphysematous changes. No focal airspace opacity to indicate pneumonia. No pleural effusion or pneumothorax. Mild-to-moderate multilevel degenerative disc changes of the thoracic spine. Cholecystectomy clips. IMPRESSION: 1. Chronic hyperinflation and findings suggesting emphysematous change. 2. No acute lung process. Electronically Signed   By: RYvonne KendallM.D.   On: 10/28/2021 17:11   CT Head Wo Contrast  Result Date: 10/28/2021 CLINICAL DATA:  Shortness of breath with vomiting and diarrhea. EXAM: CT HEAD WITHOUT CONTRAST TECHNIQUE: Contiguous axial images were obtained from the base of the skull through the vertex without intravenous contrast. RADIATION DOSE REDUCTION: This exam was performed according to the departmental dose-optimization program which includes automated exposure control, adjustment of the mA and/or kV according to patient size and/or use of iterative reconstruction technique. COMPARISON:  Oct 08, 2021 FINDINGS: Brain: No evidence of acute infarction, hemorrhage, hydrocephalus, extra-axial collection or mass lesion/mass effect. Vascular: A metallic density right MCA bifurcation aneurysm clip is seen within the anterolateral aspect of the anterolateral temporoparietal region. Skull: A right  frontotemporal craniotomy defect is seen. Sinuses/Orbits: No acute finding. Other: None. IMPRESSION: 1. No acute intracranial abnormality. 2. Right frontotemporal craniotomy defect with an adjacent metallic density right MCA bifurcation aneurysm clip. Electronically Signed   By: Virgina Norfolk M.D.   On: 10/28/2021 19:49   CT CHEST ABDOMEN PELVIS WO CONTRAST  Result Date: 10/28/2021 CLINICAL DATA:  Shortness of breath with vomiting and diarrhea. EXAM: CT CHEST, ABDOMEN AND PELVIS WITHOUT CONTRAST TECHNIQUE: Multidetector CT imaging of the chest, abdomen and pelvis was performed following the standard protocol without IV contrast. RADIATION DOSE REDUCTION: This exam was performed according to the departmental dose-optimization program which includes automated exposure control, adjustment of the mA and/or kV according to patient size and/or use of iterative reconstruction technique. COMPARISON:  None Available. FINDINGS: CT CHEST FINDINGS Cardiovascular: There is mild calcification of the aortic arch, without evidence of aortic aneurysm. Normal heart size with marked severity coronary artery calcification. There is a trace amount of pericardial fluid. Mediastinum/Nodes: No enlarged mediastinal, hilar, or axillary lymph nodes. Thyroid gland, trachea, and esophagus demonstrate no significant findings. Lungs/Pleura: Lungs are clear. No pleural effusion or pneumothorax. Musculoskeletal: No chest wall mass or suspicious bone lesions identified. CT ABDOMEN PELVIS FINDINGS Hepatobiliary: No focal liver abnormality is seen. Status post cholecystectomy. No biliary dilatation. Pancreas: Unremarkable. No pancreatic ductal dilatation or surrounding inflammatory changes. Spleen: Normal in size without focal abnormality. Adrenals/Urinary Tract: Adrenal glands are unremarkable. Kidneys are normal in size. Multiple bilateral renal cysts are seen. No additional follow-up or imaging is recommended. Numerous bilateral subcentimeter non-obstructing renal calculi are also noted. Adjacent 2 mm and 3 mm obstructing renal calculi are seen within the distal right ureter (axial CT image 107, CT series 2). There is mild right-sided hydronephrosis, hydroureter and periureteral inflammatory fat stranding. Bladder is  unremarkable. Stomach/Bowel: Stomach is within normal limits. The appendix is surgically absent no evidence of bowel wall thickening, distention, or inflammatory changes. Vascular/Lymphatic: Aortic atherosclerosis. No enlarged abdominal or pelvic lymph nodes. Reproductive: Status post hysterectomy. No adnexal masses. Other: No abdominal wall hernia or abnormality. No abdominopelvic ascites. Musculoskeletal: No acute or significant osseous findings. IMPRESSION: 1. Adjacent 2 mm and 3 mm obstructing renal calculi within the distal right ureter. 2. Numerous bilateral subcentimeter non-obstructing renal calculi. 3. Marked severity coronary artery calcification. 4. Trace amount of pericardial fluid. 5. Aortic atherosclerosis. Aortic Atherosclerosis (ICD10-I70.0). Electronically Signed   By: Virgina Norfolk M.D.   On: 10/28/2021 19:53    Anti-infectives: Anti-infectives (From admission, onward)    Start     Dose/Rate Route Frequency Ordered Stop   10/28/21 2015  meropenem (MERREM) 1 g in sodium chloride 0.9 % 100 mL IVPB  Status:  Discontinued        1 g 200 mL/hr over 30 Minutes Intravenous  Once 10/28/21 2004 10/28/21 2013   10/28/21 2015  [MAR Hold]  meropenem (MERREM) 1 g in sodium chloride 0.9 % 100 mL IVPB        (MAR Hold since Wed 10/29/2021 at 0645.Hold Reason: Transfer to a Procedural area)   1 g 200 mL/hr over 30 Minutes Intravenous Every 12 hours 10/28/21 2013         Assessment/Plan:  Proceed as planned with BILATERAL ureteral stent placement for renal decompression in setting of partial renal obstruction and possible GU infection. Risks, benefits, alternatives, expected peri-op course, need for 2nd stage surgery (elective ureteroscopy after clears infectious parameters) discussed previously and reiterated.   Alexis Frock 10/29/2021

## 2021-10-29 NOTE — ED Notes (Signed)
Patient placed on bedpan.

## 2021-10-29 NOTE — Progress Notes (Signed)
PROGRESS NOTE    [facility]  HWE:[id number] DOB: [birth year] DOA: 10/28/2021 PCP: Laura Carol, MD     Brief Narrative:  66 y.o. WF PMHx nephrolithiasis, DM type 2 insulin-dependent, HLD, thyroid disease, migraines, brain aneurysm, lumbosacral radiculopathy  Admitted to [facility] on 10/28/2021 by way of transfer from [facility] emergency department with obstructing right lithiasis after presenting from home to the latter complaining of nausea/vomiting.   The patient reports 3 to 4 days of intermittent nausea resulting in at least 2-3 episodes per day of nonbloody, nonbilious emesis over that timeframe, with most recent such episode of emesis occurring just prior to presenting to [facility] earlier in the day.  She notes that this has been associated with intermittent sharp right-sided flank pain as well as some new onset gross hematuria in the absence of any overt dysuria.  She is also noted subjective fever over the last 1 to 2 days, in the absence of chills, full body rigors, or generalized myalgias.  In the context of her recent nausea/vomiting, the patient reports a significant decline in her oral intake over the course of the last few days.  No recent trauma or travel.  No recent diarrhea, melena, hematochezia, rash.   Over the last 1 to 2 days, she is also noted some mild shortness of breath at rest in the absence of any orthopnea, PND, or worsening of peripheral edema.  Not associate with any chest pain, cough, mopped assist, palpitations, diaphoresis, dizziness, presyncope, or syncope.  Denies any known chronic underlying pulmonary pathology.  She has a mild bilateral frontal lobe headache, which she suspects is on the basis of dehydration and limited oral intake over the last few days.  Weakness, acute focal numbness, paresthesias, acute change in vision, slurred speech, dysarthria, or vertigo.   Technologist a history of  insulin-dependent type 2 diabetes mellitus, with chart review revealing most recent hemoglobin A1c to be 6.9% in 11/2017.  Outpatient insulin regimen reported to be Lantus 25 units SQ nightly as well as metformin and canagliflozin.    Not on any blood thinners as an outpatient, including no aspirin.   She conveys a history of kidney stone several years ago.    Subjective: Patient in extreme pain, A/O x4.  Rates pain 10/10 tachycardic, diaphoretic, anxious.   Assessment & Plan: Covid vaccination;   Principal Problem:   Ureteral obstruction, right Active Problems:   UTI (urinary tract infection)   Severe sepsis (HCC)   DKA (diabetic ketoacidosis) (HCC)   AKI (acute kidney injury) ([facility])   Acquired hypothyroidism  Obstructing right ureteral stones -In the setting of presenting 3 to 4 days of nausea/vomiting associated with new onset right flank discomfort, subjective fever, with CT abdomen/pelvis showing 2 mm and 3 mm obstructing renal calculi within the distal right ureter with associated mild right-sided hydronephrosis, hydroureter, periureteral inflammatory fat stranding.  Appears associated with presenting severe sepsis due to suspected urinary tract infection wanting source control, as further detailed below. -EDP discussed the patient's case and imaging with the on-call urologist, Dr. Tresa [facility], Requested admission to the hospitalist service at [facility], where Dr. Tresa [facility], will formally consult and see the patient in morning.  He requested the patient be kept n.p.o. for potential ureteroscopy with stent placement at that time. -6/7 s/p cystoscopy with bilateral stent placement.  See below - 6/7 patient pain not adequately controlled - 6/7 pain control  1.  Morphine 2 mg q 3hr  PRN  2.  Ativan 1 mg x 1 dose  3.  Consider starting OxyContin 10 mg BID  Severe sepsis/Complicated UTI - On admission presenting leukocytosis, tachycardia. Lactic acid level: -Continue current antibiotic x14  days  DKA/DM type II uncontrolled with hyperglycemia -Endo tool -6/7 Levemir 15 units now, continue Endo tool for 2 hours after administration and then discontinue. - 6/7 Moderate SSI   Essential HTN - 6/7 Propranolol ER 60 mg BID (home dose 40 mg TID: We do not have that product) -6/7 Hydralazine IV 10 mg q 6 hr PRN SBP> 150 or DBP> 100   AKI - Presenting serum creatinine 1.38 (most recent prior value of 0.94 July 2019).   -Multifactorial UTI, urolithiasis,   Recurrent Urolithiasis -  Pre 2023 - SWL x several, URS x 1 10/2021 - 75m Rt distal ureteral stone with very mild hydro and punctate bilateral renal stones on ER CT 10/2021 on eval abdominal pain. C r 1.3. UA with pyuria, -nitirtes.   2 - Rule Out Pyelonephritis - subjective maliase at home, mild leukocytosis to 14k, lactate 2.1 by ER eval 10/2021. No fevers / hypotensision sepsis. Also in mild DKA. UCX 10/28/21 pending, placed on empiric Merrem.   Acute urinary retention - 6/7 in and out cath -Strict in and out - 6/7 if patient continues to retain urine will place Foley catheter  Acquired hypothyroidism - TSH pending - Patient not on any thyroid medication on admission   Hypokalemia - Potassium goal> 4 - Potassium IV 50 mEq     Mobility Assessment (last 72 hours)     Mobility Assessment   No documentation.             Interdisciplinary Goals of Care Family Meeting   Date carried out: 10/29/2021  Location of the meeting:   Member's involved:   Durable Power of ATour manager     Discussion: We discussed goals of care for [name].    Code status:   Disposition:   Time spent for the meeting:     Laura Ose J, MD  10/29/2021, 8:55 AM         DVT prophylaxis:  Code Status:  Family Communication:  Status is: Inpatient    Dispo: The patient is from: Home              Anticipated d/c is to: Home              Anticipated d/c date is: 3 days               Patient currently is not medically stable to d/c.      Consultants:  Urology Dr. TAlexis [facility]   Procedures/Significant Events:  6/7 CYSTOSCOPY WITH RETROGRADE PYELOGRAM, URETEROSCOPY AND STENT PLACEMENT (Bilateral)  I have personally reviewed and interpreted all radiology studies and my findings are as above.  VENTILATOR SETTINGS:    Cultures   Antimicrobials: Anti-infectives (From admission, onward)    Start     Dose/Rate Route Frequency Ordered Stop   10/29/21 1800  cefTRIAXone (ROCEPHIN) 1 g in sodium chloride 0.9 % 100 mL IVPB        1 g 200 mL/hr over 30 Minutes Intravenous Every 24 hours 10/29/21 1401     10/28/21 2015  meropenem (MERREM) 1 g in sodium chloride 0.9 % 100 mL IVPB  Status:  Discontinued        1 g 200 mL/hr over 30 Minutes Intravenous  Once 10/28/21 2004 10/28/21 2013   10/28/21 2015  meropenem (MERREM) 1 g in sodium chloride 0.9 % 100 mL IVPB  Status:  Discontinued        1 g 200 mL/hr over 30 Minutes Intravenous Every 12 hours 10/28/21 2013 10/29/21 1401         Devices    LINES / TUBES:      Continuous Infusions:  dextrose 5% lactated ringers 125 mL/hr at 10/29/21 0500   insulin 2.6 Units/hr (10/29/21 0617)   lactated ringers Stopped (10/28/21 2039)   meropenem (MERREM) IV 1 g (10/29/21 0837)     Objective: Vitals:   10/29/21 0600 10/29/21 0800 10/29/21 0815 10/29/21 0853  BP: (!) 162/88 (!) 144/77 (!) 149/80   Pulse: (!) 109 (!) 105 (!) 103 (!) 106  Resp: (!) 22 (!) 24 (!) 22 20  Temp:  97.8 F (36.6 C)    TempSrc:      SpO2: 97% 100% 96% 96%  Weight:      Height:        Intake/Output Summary (Last 24 hours) at 10/29/2021 0855 Last data filed at 10/29/2021 3299 Gross per 24 hour  Intake 2162.87 ml  Output --  Net 2162.87 ml   Filed Weights   10/28/21 1645 10/29/21 0200  Weight: 69.7 kg 67.3 kg    Examination:  General: A/O x4, No acute respiratory distress Eyes: negative scleral hemorrhage, negative  anisocoria, negative icterus ENT: Negative Runny nose, negative gingival bleeding, Neck:  Negative scars, masses, torticollis, lymphadenopathy, JVD Lungs: tachypnea clear to auscultation bilaterally without wheezes or crackles Cardiovascular: Tachycardic without murmur gallop or rub normal S1 and S2 Abdomen: Positive abdominal pain (suprapubic and bilateral CVA tenderness), nondistended, positive soft, bowel sounds, no rebound, no ascites, no appreciable mass Extremities: No significant cyanosis, clubbing, or edema bilateral lower extremities Skin: Negative rashes, lesions, ulcers Psychiatric:  Negative depression, negative anxiety, negative fatigue, negative mania  Central nervous system:  Cranial nerves II through XII intact, tongue/uvula midline, all extremities muscle strength 5/5, sensation intact throughout, negative dysarthria, negative expressive aphasia, negative receptive aphasia.  .     Data Reviewed: Care during the described time interval was provided by me .  I have reviewed this patient's available data, including medical history, events of note, physical examination, and all test results as part of my evaluation.  CBC: Recent Labs  Lab 10/28/21 1652 10/29/21 0254  WBC 14.8* 11.9*  NEUTROABS 10.5* 6.8  HGB 14.8 12.6  HCT 45.0 38.5  MCV 75.3* 77.6*  PLT 414* 242   Basic Metabolic Panel: Recent Labs  Lab 10/28/21 1652 10/28/21 2013 10/29/21 0254  NA 133* 135 139  K 3.6 3.4* 3.2*  CL 95* 103 106  CO2 19* 21* 23  GLUCOSE 298* 235* 153*  BUN 26* 25* 23  CREATININE 1.38* 1.30* 0.92  CALCIUM 9.4 8.2* 8.8*  MG  --   --  1.7   GFR: Estimated Creatinine Clearance: 58.5 mL/min (by C-G formula based on SCr of 0.92 mg/dL). Liver Function Tests: Recent Labs  Lab 10/28/21 1652 10/29/21 0254  AST 24 18  ALT 15 10  ALKPHOS 102 75  BILITOT 2.0* 1.6*  PROT 8.6* 6.7  ALBUMIN 4.0 3.3*   Recent Labs  Lab 10/28/21 1847  LIPASE 24   No results for input(s):  AMMONIA in the last 168 hours. Coagulation Profile: Recent Labs  Lab 10/29/21 0549  INR 1.0   Cardiac Enzymes: No results for input(s): CKTOTAL, CKMB, CKMBINDEX,  TROPONINI in the last 168 hours. BNP (last 3 results) No results for input(s): PROBNP in the last 8760 hours. HbA1C: Recent Labs    10/29/21 0330  HGBA1C 8.8*   CBG: Recent Labs  Lab 10/29/21 0215 10/29/21 0313 10/29/21 0413 10/29/21 0615 10/29/21 0802  GLUCAP 148* 162* 153* 175* 177*   Lipid Profile: No results for input(s): CHOL, HDL, LDLCALC, TRIG, CHOLHDL, LDLDIRECT in the last 72 hours. Thyroid Function Tests: Recent Labs    10/29/21 0549  TSH 3.477   Anemia Panel: No results for input(s): VITAMINB12, FOLATE, FERRITIN, TIBC, IRON, RETICCTPCT in the last 72 hours. Sepsis Labs: Recent Labs  Lab 10/28/21 1847 10/28/21 2220  LATICACIDVEN 2.1* 1.3    Recent Results (from the past 240 hour(s))  SARS Coronavirus 2 by RT PCR (hospital order, performed in [facility] hospital lab) *cepheid single result test* Anterior Nasal Swab     Status: None   Collection Time: 10/28/21  6:55 PM   Specimen: Anterior Nasal Swab  Result Value Ref Range Status   SARS Coronavirus 2 by RT PCR NEGATIVE NEGATIVE Final    Comment: (NOTE) SARS-CoV-2 target nucleic acids are NOT DETECTED.  The SARS-CoV-2 RNA is generally detectable in upper and lower respiratory specimens during the acute phase of infection. The lowest concentration of SARS-CoV-2 viral copies this assay can detect is 250 copies / mL. A negative result does not preclude SARS-CoV-2 infection and should not be used as the sole basis for treatment or other patient management decisions.  A negative result may occur with improper specimen collection / handling, submission of specimen other than nasopharyngeal swab, presence of viral mutation(s) within the areas targeted by this assay, and inadequate number of viral copies (<250 copies / mL). A negative result  must be combined with clinical observations, patient history, and epidemiological information.  Fact Sheet for Patients:   https://www.patel.info/  Fact Sheet for Healthcare Providers: https://hall.com/  This test is not yet approved or  cleared by the [place] FDA and has been authorized for detection and/or diagnosis of SARS-CoV-2 by FDA under an Emergency Use Authorization (EUA).  This EUA will remain in effect (meaning this test can be used) for the duration of the COVID-19 declaration under Section 564(b)(1) of the Act, 21 U.S.C. section 360bbb-3(b)(1), unless the authorization is terminated or revoked sooner.  Performed at [facility], [address] [place], [place] [address]   MRSA Next Gen by PCR, Nasal     Status: None   Collection Time: 10/29/21  2:04 AM   Specimen: Nasal Mucosa; Nasal Swab  Result Value Ref Range Status   MRSA by PCR Next Gen NOT DETECTED NOT DETECTED Final    Comment: (NOTE) The GeneXpert MRSA Assay (FDA approved for NASAL specimens only), is one component of a comprehensive MRSA colonization surveillance program. It is not intended to diagnose MRSA infection nor to guide or monitor treatment for MRSA infections. Test performance is not FDA approved in patients less than 19 years old. Performed at [facility], [place] [address], [place], [place] [address]          Radiology Studies: DG Chest 2 View  Result Date: 10/28/2021 CLINICAL DATA:  Shortness of breath.  Tightness in chest. EXAM: CHEST - 2 VIEW COMPARISON:  PA chest and right rib radiographs 04/12/2018; chest two views 03/07/2015 FINDINGS: Cardiac silhouette and mediastinal contours are within normal limits with calcification again seen within the aortic arch. There is flattening of  the diaphragms and moderate hyperinflation. Increased lucencies within the upper lungs are again seen suggesting chronic cystic  emphysematous changes. No focal airspace opacity to indicate pneumonia. No pleural effusion or pneumothorax. Mild-to-moderate multilevel degenerative disc changes of the thoracic spine. Cholecystectomy clips. IMPRESSION: 1. Chronic hyperinflation and findings suggesting emphysematous change. 2. No acute lung process. Electronically Signed   By: Yvonne Kendall M.D.   On: 10/28/2021 17:11   CT Head Wo Contrast  Result Date: 10/28/2021 CLINICAL DATA:  Shortness of breath with vomiting and diarrhea. EXAM: CT HEAD WITHOUT CONTRAST TECHNIQUE: Contiguous axial images were obtained from the base of the skull through the vertex without intravenous contrast. RADIATION DOSE REDUCTION: This exam was performed according to the departmental dose-optimization program which includes automated exposure control, adjustment of the mA and/or kV according to patient size and/or use of iterative reconstruction technique. COMPARISON:  Oct 08, 2021 FINDINGS: Brain: No evidence of acute infarction, hemorrhage, hydrocephalus, extra-axial collection or mass lesion/mass effect. Vascular: A metallic density right MCA bifurcation aneurysm clip is seen within the anterolateral aspect of the anterolateral temporoparietal region. Skull: A right frontotemporal craniotomy defect is seen. Sinuses/Orbits: No acute finding. Other: None. IMPRESSION: 1. No acute intracranial abnormality. 2. Right frontotemporal craniotomy defect with an adjacent metallic density right MCA bifurcation aneurysm clip. Electronically Signed   By: Virgina Norfolk M.D.   On: 10/28/2021 19:49   CT CHEST ABDOMEN PELVIS WO CONTRAST  Result Date: 10/28/2021 CLINICAL DATA:  Shortness of breath with vomiting and diarrhea. EXAM: CT CHEST, ABDOMEN AND PELVIS WITHOUT CONTRAST TECHNIQUE: Multidetector CT imaging of the chest, abdomen and pelvis was performed following the standard protocol without IV contrast. RADIATION DOSE REDUCTION: This exam was performed according to the  departmental dose-optimization program which includes automated exposure control, adjustment of the mA and/or kV according to patient size and/or use of iterative reconstruction technique. COMPARISON:  None Available. FINDINGS: CT CHEST FINDINGS Cardiovascular: There is mild calcification of the aortic arch, without evidence of aortic aneurysm. Normal heart size with marked severity coronary artery calcification. There is a trace amount of pericardial fluid. Mediastinum/Nodes: No enlarged mediastinal, hilar, or axillary lymph nodes. Thyroid gland, trachea, and esophagus demonstrate no significant findings. Lungs/Pleura: Lungs are clear. No pleural effusion or pneumothorax. Musculoskeletal: No chest wall mass or suspicious bone lesions identified. CT ABDOMEN PELVIS FINDINGS Hepatobiliary: No focal liver abnormality is seen. Status post cholecystectomy. No biliary dilatation. Pancreas: Unremarkable. No pancreatic ductal dilatation or surrounding inflammatory changes. Spleen: Normal in size without focal abnormality. Adrenals/Urinary Tract: Adrenal glands are unremarkable. Kidneys are normal in size. Multiple bilateral renal cysts are seen. No additional follow-up or imaging is recommended. Numerous bilateral subcentimeter non-obstructing renal calculi are also noted. Adjacent 2 mm and 3 mm obstructing renal calculi are seen within the distal right ureter (axial CT image 107, CT series 2). There is mild right-sided hydronephrosis, hydroureter and periureteral inflammatory fat stranding. Bladder is unremarkable. Stomach/Bowel: Stomach is within normal limits. The appendix is surgically absent no evidence of bowel wall thickening, distention, or inflammatory changes. Vascular/Lymphatic: Aortic atherosclerosis. No enlarged abdominal or pelvic lymph nodes. Reproductive: Status post hysterectomy. No adnexal masses. Other: No abdominal wall hernia or abnormality. No abdominopelvic ascites. Musculoskeletal: No acute or  significant osseous findings. IMPRESSION: 1. Adjacent 2 mm and 3 mm obstructing renal calculi within the distal right ureter. 2. Numerous bilateral subcentimeter non-obstructing renal calculi. 3. Marked severity coronary artery calcification. 4. Trace amount of pericardial fluid. 5. Aortic atherosclerosis. Aortic Atherosclerosis (ICD10-I70.0). Electronically Signed  By: Virgina Norfolk M.D.   On: 10/28/2021 19:53        Scheduled Meds:  Chlorhexidine Gluconate Cloth  6 each Topical Daily   Continuous Infusions:  dextrose 5% lactated ringers 125 mL/hr at 10/29/21 0500   insulin 2.6 Units/hr (10/29/21 0617)   lactated ringers Stopped (10/28/21 2039)   meropenem (MERREM) IV 1 g (10/29/21 0837)     LOS: 0 days    Time spent:40 min    [employer], Geraldo Docker, MD [facility]   If 7PM-7AM, please contact night-coverage 10/29/2021, 8:55 AM

## 2021-10-29 NOTE — Assessment & Plan Note (Signed)
(  please see severe sepsis) 

## 2021-10-30 ENCOUNTER — Encounter (HOSPITAL_COMMUNITY): Payer: Self-pay | Admitting: Urology

## 2021-10-30 DIAGNOSIS — R6520 Severe sepsis without septic shock: Secondary | ICD-10-CM

## 2021-10-30 DIAGNOSIS — N135 Crossing vessel and stricture of ureter without hydronephrosis: Secondary | ICD-10-CM | POA: Diagnosis not present

## 2021-10-30 DIAGNOSIS — E039 Hypothyroidism, unspecified: Secondary | ICD-10-CM

## 2021-10-30 DIAGNOSIS — A419 Sepsis, unspecified organism: Principal | ICD-10-CM

## 2021-10-30 DIAGNOSIS — N179 Acute kidney failure, unspecified: Secondary | ICD-10-CM | POA: Diagnosis not present

## 2021-10-30 DIAGNOSIS — E1010 Type 1 diabetes mellitus with ketoacidosis without coma: Secondary | ICD-10-CM | POA: Diagnosis not present

## 2021-10-30 DIAGNOSIS — E1110 Type 2 diabetes mellitus with ketoacidosis without coma: Secondary | ICD-10-CM

## 2021-10-30 DIAGNOSIS — N3000 Acute cystitis without hematuria: Secondary | ICD-10-CM

## 2021-10-30 LAB — GLUCOSE, CAPILLARY
Glucose-Capillary: 124 mg/dL — ABNORMAL HIGH (ref 70–99)
Glucose-Capillary: 157 mg/dL — ABNORMAL HIGH (ref 70–99)
Glucose-Capillary: 170 mg/dL — ABNORMAL HIGH (ref 70–99)
Glucose-Capillary: 178 mg/dL — ABNORMAL HIGH (ref 70–99)
Glucose-Capillary: 196 mg/dL — ABNORMAL HIGH (ref 70–99)
Glucose-Capillary: 219 mg/dL — ABNORMAL HIGH (ref 70–99)
Glucose-Capillary: 70 mg/dL (ref 70–99)

## 2021-10-30 LAB — PHOSPHORUS: Phosphorus: 3.7 mg/dL (ref 2.5–4.6)

## 2021-10-30 LAB — CBC WITH DIFFERENTIAL/PLATELET
Abs Immature Granulocytes: 0.03 10*3/uL (ref 0.00–0.07)
Basophils Absolute: 0.1 10*3/uL (ref 0.0–0.1)
Basophils Relative: 1 %
Eosinophils Absolute: 0.2 10*3/uL (ref 0.0–0.5)
Eosinophils Relative: 2 %
HCT: 37.2 % (ref 36.0–46.0)
Hemoglobin: 11.7 g/dL — ABNORMAL LOW (ref 12.0–15.0)
Immature Granulocytes: 0 %
Lymphocytes Relative: 37 %
Lymphs Abs: 3.2 10*3/uL (ref 0.7–4.0)
MCH: 24.8 pg — ABNORMAL LOW (ref 26.0–34.0)
MCHC: 31.5 g/dL (ref 30.0–36.0)
MCV: 78.8 fL — ABNORMAL LOW (ref 80.0–100.0)
Monocytes Absolute: 0.7 10*3/uL (ref 0.1–1.0)
Monocytes Relative: 9 %
Neutro Abs: 4.3 10*3/uL (ref 1.7–7.7)
Neutrophils Relative %: 51 %
Platelets: 296 10*3/uL (ref 150–400)
RBC: 4.72 MIL/uL (ref 3.87–5.11)
RDW: 15.1 % (ref 11.5–15.5)
WBC: 8.4 10*3/uL (ref 4.0–10.5)
nRBC: 0 % (ref 0.0–0.2)

## 2021-10-30 LAB — MAGNESIUM: Magnesium: 1.7 mg/dL (ref 1.7–2.4)

## 2021-10-30 LAB — COMPREHENSIVE METABOLIC PANEL
ALT: 10 U/L (ref 0–44)
AST: 11 U/L — ABNORMAL LOW (ref 15–41)
Albumin: 2.8 g/dL — ABNORMAL LOW (ref 3.5–5.0)
Alkaline Phosphatase: 66 U/L (ref 38–126)
Anion gap: 7 (ref 5–15)
BUN: 11 mg/dL (ref 8–23)
CO2: 26 mmol/L (ref 22–32)
Calcium: 8.6 mg/dL — ABNORMAL LOW (ref 8.9–10.3)
Chloride: 110 mmol/L (ref 98–111)
Creatinine, Ser: 0.66 mg/dL (ref 0.44–1.00)
GFR, Estimated: >60 mL/min (ref >60)
Glucose, Bld: 71 mg/dL (ref 70–99)
Potassium: 3.4 mmol/L — ABNORMAL LOW (ref 3.5–5.1)
Sodium: 143 mmol/L (ref 135–145)
Total Bilirubin: 0.8 mg/dL (ref 0.3–1.2)
Total Protein: 6 g/dL — ABNORMAL LOW (ref 6.5–8.1)

## 2021-10-30 MED ORDER — OXYCODONE-ACETAMINOPHEN 7.5-325 MG PO TABS
1.0000 | ORAL_TABLET | Freq: Four times a day (QID) | ORAL | Status: DC | PRN
  Administered 2021-10-30 – 2021-11-01 (×2): 1 via ORAL
  Filled 2021-10-30 (×2): qty 1

## 2021-10-30 MED ORDER — FENTANYL CITRATE PF 50 MCG/ML IJ SOSY: 25.0000 ug | PREFILLED_SYRINGE | INTRAMUSCULAR | Status: DC | PRN

## 2021-10-30 MED ORDER — FENTANYL CITRATE (PF) 100 MCG/2ML IJ SOLN: 25.0000 ug | INTRAMUSCULAR | Status: DC | PRN

## 2021-10-30 MED ORDER — POTASSIUM CHLORIDE 10 MEQ/100ML IV SOLN: 10.0000 meq | INTRAVENOUS | Status: DC

## 2021-10-30 MED ORDER — POTASSIUM CHLORIDE CRYS ER 20 MEQ PO TBCR
40.0000 meq | EXTENDED_RELEASE_TABLET | Freq: Once | ORAL | Status: AC
  Administered 2021-10-30: 40 meq via ORAL
  Filled 2021-10-30: qty 2

## 2021-10-30 NOTE — Plan of Care (Signed)
  Problem: Fluid Volume: Goal: Ability to achieve a balanced intake and output will improve Outcome: Progressing   Problem: Nutritional: Goal: Maintenance of adequate nutrition will improve Outcome: Progressing

## 2021-10-30 NOTE — Progress Notes (Signed)
1 Day Post-Op   Subjective/Chief Complaint:  1 - Recurrent Urolithiasis -  Pre 2023 - SWL x several, URS x 1 10/2021 - 61m Rt distal ureteral stone with very mild hydro and punctate bilateral renal stones on ER CT 10/2021 on eval abdominal pain. C r 1.3. UA with pyuria, -nitirtes. ==> Bilateral ureteral stents placed 10/29/21.    2 - Rule Out Pyelonephritis - subjective maliase at home, mild leukocytosis to 14k, lactate 2.1 by ER eval 10/2021. No fevers / hypotensision sepsis. Also in mild DKA. UCX 10/28/21 non-clonal, placed on empiric Merrem.     Today "[name] is significantly improved. Resolving fevers, leukocytosis, tachycardai and subjective malaise. Cr 0.6, UCX non-clonal.    Objective: Vital signs in last 24 hours: Temp:  [97.6 F (36.4 C)-98.5 F (36.9 C)] 98 F (36.7 C) (06/08 1215) Pulse Rate:  [72-101] 85 (06/08 1400) Resp:  [16-22] 17 (06/08 1400) BP: (99-167)/(46-88) 117/62 (06/08 1400) SpO2:  [94 %-100 %] 96 % (06/08 1400) Last BM Date : 10/29/21  Intake/Output from previous day: 06/07 0701 - 06/08 0700 In: 1479.2 [I.V.:722.3; IV Piggyback:756.9] Out: 300 [Urine:300] Intake/Output this shift: Total I/O In: 480 [P.O.:480] Out: 1000 [Urine:1000]  EXAM: NAD. AOx3, pleasant and in improved spirits today Non-labored breathing on RA HR 80s on monitor SNTND No c/c/e  Lab Results:  Recent Labs    10/29/21 0254 10/30/21 0259  WBC 11.9* 8.4  HGB 12.6 11.7*  HCT 38.5 37.2  PLT 282 296   BMET Recent Labs    10/29/21 1008 10/30/21 0259  NA 138 143  K 3.5 3.4*  CL 107 110  CO2 22 26  GLUCOSE 159* 71  BUN 17 11  CREATININE 0.75 0.66  CALCIUM 8.5* 8.6*   PT/INR Recent Labs    10/29/21 0549  LABPROT 13.4  INR 1.0   ABG No results for input(s): "PHART", "HCO3" in the last 72 hours.  Invalid input(s): "PCO2", "PO2"  Studies/Results: DG C-Arm 1-60 Min-No Report  Result Date: 10/29/2021 Fluoroscopy was utilized by the requesting physician.  No  radiographic interpretation.   CT CHEST ABDOMEN PELVIS WO CONTRAST  Result Date: 10/28/2021 CLINICAL DATA:  Shortness of breath with vomiting and diarrhea. EXAM: CT CHEST, ABDOMEN AND PELVIS WITHOUT CONTRAST TECHNIQUE: Multidetector CT imaging of the chest, abdomen and pelvis was performed following the standard protocol without IV contrast. RADIATION DOSE REDUCTION: This exam was performed according to the departmental dose-optimization program which includes automated exposure control, adjustment of the mA and/or kV according to patient size and/or use of iterative reconstruction technique. COMPARISON:  None Available. FINDINGS: CT CHEST FINDINGS Cardiovascular: There is mild calcification of the aortic arch, without evidence of aortic aneurysm. Normal heart size with marked severity coronary artery calcification. There is a trace amount of pericardial fluid. Mediastinum/Nodes: No enlarged mediastinal, hilar, or axillary lymph nodes. Thyroid gland, trachea, and esophagus demonstrate no significant findings. Lungs/Pleura: Lungs are clear. No pleural effusion or pneumothorax. Musculoskeletal: No chest wall mass or suspicious bone lesions identified. CT ABDOMEN PELVIS FINDINGS Hepatobiliary: No focal liver abnormality is seen. Status post cholecystectomy. No biliary dilatation. Pancreas: Unremarkable. No pancreatic ductal dilatation or surrounding inflammatory changes. Spleen: Normal in size without focal abnormality. Adrenals/Urinary Tract: Adrenal glands are unremarkable. Kidneys are normal in size. Multiple bilateral renal cysts are seen. No additional follow-up or imaging is recommended. Numerous bilateral subcentimeter non-obstructing renal calculi are also noted. Adjacent 2 mm and 3 mm obstructing renal calculi are seen within the distal right ureter (axial  CT image 107, CT series 2). There is mild right-sided hydronephrosis, hydroureter and periureteral inflammatory fat stranding. Bladder is unremarkable.  Stomach/Bowel: Stomach is within normal limits. The appendix is surgically absent no evidence of bowel wall thickening, distention, or inflammatory changes. Vascular/Lymphatic: Aortic atherosclerosis. No enlarged abdominal or pelvic lymph nodes. Reproductive: Status post hysterectomy. No adnexal masses. Other: No abdominal wall hernia or abnormality. No abdominopelvic ascites. Musculoskeletal: No acute or significant osseous findings. IMPRESSION: 1. Adjacent 2 mm and 3 mm obstructing renal calculi within the distal right ureter. 2. Numerous bilateral subcentimeter non-obstructing renal calculi. 3. Marked severity coronary artery calcification. 4. Trace amount of pericardial fluid. 5. Aortic atherosclerosis. Aortic Atherosclerosis (ICD10-I70.0). Electronically Signed   By: Laura Gentry M.D.   On: 10/28/2021 19:53   CT Head Wo Contrast  Result Date: 10/28/2021 CLINICAL DATA:  Shortness of breath with vomiting and diarrhea. EXAM: CT HEAD WITHOUT CONTRAST TECHNIQUE: Contiguous axial images were obtained from the base of the skull through the vertex without intravenous contrast. RADIATION DOSE REDUCTION: This exam was performed according to the departmental dose-optimization program which includes automated exposure control, adjustment of the mA and/or kV according to patient size and/or use of iterative reconstruction technique. COMPARISON:  Oct 08, 2021 FINDINGS: Brain: No evidence of acute infarction, hemorrhage, hydrocephalus, extra-axial collection or mass lesion/mass effect. Vascular: A metallic density right MCA bifurcation aneurysm clip is seen within the anterolateral aspect of the anterolateral temporoparietal region. Skull: A right frontotemporal craniotomy defect is seen. Sinuses/Orbits: No acute finding. Other: None. IMPRESSION: 1. No acute intracranial abnormality. 2. Right frontotemporal craniotomy defect with an adjacent metallic density right MCA bifurcation aneurysm clip. Electronically Signed    By: Laura Gentry M.D.   On: 10/28/2021 19:49   DG Chest 2 View  Result Date: 10/28/2021 CLINICAL DATA:  Shortness of breath.  Tightness in chest. EXAM: CHEST - 2 VIEW COMPARISON:  PA chest and right rib radiographs 04/12/2018; chest two views 03/07/2015 FINDINGS: Cardiac silhouette and mediastinal contours are within normal limits with calcification again seen within the aortic arch. There is flattening of the diaphragms and moderate hyperinflation. Increased lucencies within the upper lungs are again seen suggesting chronic cystic emphysematous changes. No focal airspace opacity to indicate pneumonia. No pleural effusion or pneumothorax. Mild-to-moderate multilevel degenerative disc changes of the thoracic spine. Cholecystectomy clips. IMPRESSION: 1. Chronic hyperinflation and findings suggesting emphysematous change. 2. No acute lung process. Electronically Signed   By: Yvonne Kendall M.D.   On: 10/28/2021 17:11    Anti-infectives: Anti-infectives (From admission, onward)    Start     Dose/Rate Route Frequency Ordered Stop   10/29/21 1800  cefTRIAXone (ROCEPHIN) 1 g in sodium chloride 0.9 % 100 mL IVPB        1 g 200 mL/hr over 30 Minutes Intravenous Every 24 hours 10/29/21 1401     10/28/21 2015  meropenem (MERREM) 1 g in sodium chloride 0.9 % 100 mL IVPB  Status:  Discontinued        1 g 200 mL/hr over 30 Minutes Intravenous  Once 10/28/21 2004 10/28/21 2013   10/28/21 2015  meropenem (MERREM) 1 g in sodium chloride 0.9 % 100 mL IVPB  Status:  Discontinued        1 g 200 mL/hr over 30 Minutes Intravenous Every 12 hours 10/28/21 2013 10/29/21 1401       Assessment/Plan:  Stones now temporized with stents, we will arrange outpatient ureteroscopy with goal of stone free in elective setting in  several weeks.  Infectious parameters improving rapidly, this is fortunate. OK for transition to PO ABX whenever felt OK per prinary team with 10 days total course. Cipro or doxy reasonabel given  allergies and non-clonal CX.  Appreciarte hospitalist team comanagment.    Alexis Frock 10/30/2021

## 2021-10-30 NOTE — Progress Notes (Signed)
PROGRESS NOTE    Laura Gentry  BHA:[id number] DOB: [birth year] DOA: 10/28/2021 PCP: Seward Carol, MD     Brief Narrative:  66 y.o. WF PMHx nephrolithiasis, DM type 2 insulin-dependent, HLD, thyroid disease, migraines, brain aneurysm, lumbosacral radiculopathy  Admitted to [facility] on 10/28/2021 by way of transfer from [place] emergency department with obstructing right lithiasis after presenting from home to the latter complaining of nausea/vomiting.   The patient reports 3 to 4 days of intermittent nausea resulting in at least 2-3 episodes per day of nonbloody, nonbilious emesis over that timeframe, with most recent such episode of emesis occurring just prior to presenting to [place] emergency department earlier in the day.  She notes that this has been associated with intermittent sharp right-sided flank pain as well as some new onset gross hematuria in the absence of any overt dysuria.  She is also noted subjective fever over the last 1 to 2 days, in the absence of chills, full body rigors, or generalized myalgias.  In the context of her recent nausea/vomiting, the patient reports a significant decline in her oral intake over the course of the last few days.  No recent trauma or travel.  No recent diarrhea, melena, hematochezia, rash.   Over the last 1 to 2 days, she is also noted some mild shortness of breath at rest in the absence of any orthopnea, PND, or worsening of peripheral edema.  Not associate with any chest pain, cough, mopped assist, palpitations, diaphoresis, dizziness, presyncope, or syncope.  Denies any known chronic underlying pulmonary pathology.  She has a mild bilateral frontal lobe headache, which she suspects is on the basis of dehydration and limited oral intake over the last few days.  Weakness, acute focal numbness, paresthesias, acute change in vision, slurred speech, dysarthria, or vertigo.   Technologist a history of  insulin-dependent type 2 diabetes mellitus, with chart review revealing most recent hemoglobin A1c to be 6.9% in 11/2017.  Outpatient insulin regimen reported to be Lantus 25 units SQ nightly as well as metformin and canagliflozin.    Not on any blood thinners as an outpatient, including no aspirin.   She conveys a history of kidney stone several years ago.    Subjective: 6/8 afebrile overnight A/O x4.  Sitting comfortably in the chair.    Assessment & Plan: Covid vaccination;   Principal Problem:   Ureteral obstruction, right Active Problems:   UTI (urinary tract infection)   Severe sepsis (HCC)   DKA (diabetic ketoacidosis) (HCC)   AKI (acute kidney injury) ([place])   Acquired hypothyroidism  Obstructing right ureteral stones -In the setting of presenting 3 to 4 days of nausea/vomiting associated with new onset right flank discomfort, subjective fever, with CT abdomen/pelvis showing 2 mm and 3 mm obstructing renal calculi within the distal right ureter with associated mild right-sided hydronephrosis, hydroureter, periureteral inflammatory fat stranding.  Appears associated with presenting severe sepsis due to suspected urinary tract infection wanting source control, as further detailed below. -EDP discussed the patient's case and imaging with the on-call urologist, Dr. Tresa Moore, Requested admission to the hospitalist service at [facility], where Dr. Tresa Moore, will formally consult and see the patient in morning.  He requested the patient be kept n.p.o. for potential ureteroscopy with stent placement at that time. -6/7 s/p cystoscopy with bilateral stent placement.  See below - 6/7 patient pain not adequately controlled - 6/7 pain control  1.  6/8 Morphine 2 mg q 3hr  PRN (hold)  2.  Ativan 1 mg x 1 dose  3.  6/8 Percocet 7.5-325 mg PRN  4.  6/8 Fentanyl 25 mcg PRN if Percocet does not alleviate pain  Severe sepsis/Complicated UTI - On admission presenting leukocytosis, tachycardia. Lactic  acid level: -Continue current antibiotic x14 days  DKA/DM type II uncontrolled with hyperglycemia -Endo tool -6/7 Levemir 15 units now, continue Endo tool for 2 hours after administration and then discontinue. - 6/7 Moderate SSI   Essential HTN - 6/7 Propranolol ER 60 mg BID (home dose 40 mg TID: We do not have that product) -6/7 Hydralazine IV 10 mg q 6 hr PRN SBP> 150 or DBP> 100   AKI - Presenting serum creatinine 1.38 (most recent prior value of 0.94 July 2019).   -Multifactorial UTI, urolithiasis,  Recurrent Urolithiasis   -Pre 2023 - SWL x several, URS x 1 -10/2021 - 39m Rt distal ureteral stone with very mild hydro and punctate bilateral renal stones on ER CT 10/2021 on eval abdominal pain. C r 1.3. UA with pyuria, -nitirtes.   Acute urinary retention - 6/7 in and out cath -Strict in and out +2.3 L - 6/7 if patient continues to retain urine will place Foley catheter -6/8 resolved  Acquired hypothyroidism - 6/7 TSH= 3.4 WNL - Patient not on any thyroid medication on admission - 6/8 states takes natural hormone replacement.  Hypokalemia - Potassium goal> 4 - 6/8 potassium po 40 mEq     Mobility Assessment (last 72 hours)     Mobility Assessment   No documentation.             Interdisciplinary Goals of Care Family Meeting   Date carried out: 10/30/2021  Location of the meeting:   Member's involved:   Durable Power of ATour manager     Discussion: We discussed goals of care for [name].    Code status:   Disposition:   Time spent for the meeting:     Milan Clare J, MD  10/30/2021, 9:29 AM         DVT prophylaxis:  Code Status:  Family Communication:  Status is: Inpatient    Dispo: The patient is from: Home              Anticipated d/c is to: Home              Anticipated d/c date is: 3 days              Patient currently is not medically stable to d/c.      Consultants:  Urology Dr.  TAlexis Frock   Procedures/Significant Events:  6/7 CYSTOSCOPY WITH RETROGRADE PYELOGRAM, URETEROSCOPY AND STENT PLACEMENT (Bilateral)  I have personally reviewed and interpreted all radiology studies and my findings are as above.  VENTILATOR SETTINGS:    Cultures   Antimicrobials: Anti-infectives (From admission, onward)    Start     Ordered Stop   10/29/21 1800  cefTRIAXone (ROCEPHIN) 1 g in sodium chloride 0.9 % 100 mL IVPB        10/29/21 1401     10/28/21 2015  meropenem (MERREM) 1 g in sodium chloride 0.9 % 100 mL IVPB  Status:  Discontinued        10/28/21 2004 10/28/21 2013   10/28/21 2015  meropenem (MERREM) 1 g in sodium chloride 0.9 % 100 mL IVPB  Status:  Discontinued        10/28/21 2013  10/29/21 1401         Devices    LINES / TUBES:      Continuous Infusions:  cefTRIAXone (ROCEPHIN)  IV Stopped (10/29/21 1803)   dextrose 5% lactated ringers Stopped (10/29/21 1400)   insulin Stopped (10/29/21 1400)   lactated ringers Stopped (10/28/21 2039)     Objective: Vitals:   10/30/21 0700 10/30/21 0800 10/30/21 0900 10/30/21 0920  BP: (!) 148/81 (!) 144/82 (!) 152/59 (!) 160/78  Pulse: 75 77 72 75  Resp: '16 17 16   '$ Temp:      TempSrc:      SpO2: 95% 96% 100%   Weight:      Height:        Intake/Output Summary (Last 24 hours) at 10/30/2021 3559 Last data filed at 10/30/2021 0800 Gross per 24 hour  Intake 835.32 ml  Output 800 ml  Net 35.32 ml    Filed Weights   10/28/21 1645 10/29/21 0200  Weight: 69.7 kg 67.3 kg    Examination:  General: A/O x4, No acute respiratory distress Eyes: negative scleral hemorrhage, negative anisocoria, negative icterus ENT: Negative Runny nose, negative gingival bleeding, Neck:  Negative scars, masses, torticollis, lymphadenopathy, JVD Lungs: tachypnea clear to auscultation bilaterally without wheezes or crackles Cardiovascular: Tachycardic without murmur gallop or rub normal S1 and S2 Abdomen: Negative  abdominal pain, nondistended, positive soft, bowel sounds, no rebound, no ascites, no appreciable mass Extremities: No significant cyanosis, clubbing, or edema bilateral lower extremities Skin: Negative rashes, lesions, ulcers Psychiatric:  Negative depression, negative anxiety, negative fatigue, negative mania  Central nervous system:  Cranial nerves II through XII intact, tongue/uvula midline, all extremities muscle strength 5/5, sensation intact throughout, negative dysarthria, negative expressive aphasia, negative receptive aphasia.  .     Data Reviewed: Care during the described time interval was provided by me .  I have reviewed this patient's available data, including medical history, events of note, physical examination, and all test results as part of my evaluation.  CBC: Recent Labs  Lab 10/28/21 1652 10/29/21 0254 10/30/21 0259  WBC 14.8* 11.9* 8.4  NEUTROABS 10.5* 6.8 4.3  HGB 14.8 12.6 11.7*  HCT 45.0 38.5 37.2  MCV 75.3* 77.6* 78.8*  PLT 414* 282 741    Basic Metabolic Panel: Recent Labs  Lab 10/28/21 1652 10/28/21 2013 10/29/21 0254 10/29/21 1008 10/30/21 0259  NA 133* 135 139 138 143  K 3.6 3.4* 3.2* 3.5 3.4*  CL 95* 103 106 107 110  CO2 19* 21* '23 22 26  '$ GLUCOSE 298* 235* 153* 159* 71  BUN 26* 25* '23 17 11  '$ CREATININE 1.38* 1.30* 0.92 0.75 0.66  CALCIUM 9.4 8.2* 8.8* 8.5* 8.6*  MG  --   --  1.7  --  1.7  PHOS  --   --   --   --  3.7    GFR: Estimated Creatinine Clearance: 67.3 mL/min (by C-G formula based on SCr of 0.66 mg/dL). Liver Function Tests: Recent Labs  Lab 10/28/21 1652 10/29/21 0254 10/30/21 0259  AST 24 18 11*  ALT '15 10 10  '$ ALKPHOS 102 75 66  BILITOT 2.0* 1.6* 0.8  PROT 8.6* 6.7 6.0*  ALBUMIN 4.0 3.3* 2.8*    Recent Labs  Lab 10/28/21 1847  LIPASE 24    No results for input(s): "AMMONIA" in the last 168 hours. Coagulation Profile: Recent Labs  Lab 10/29/21 0549  INR 1.0    Cardiac Enzymes: No results for  input(s): "CKTOTAL", "CKMB", "CKMBINDEX", "TROPONINI"  in the last 168 hours. BNP (last 3 results) No results for input(s): "PROBNP" in the last 8760 hours. HbA1C: Recent Labs    10/29/21 0330  HGBA1C 8.8*    CBG: Recent Labs  Lab 10/29/21 1725 10/29/21 2018 10/29/21 2356 10/30/21 0333 10/30/21 0824  GLUCAP 189* 180* 124* 70 170*    Lipid Profile: Recent Labs    10/29/21 1008  CHOL 158  HDL 29*  LDLCALC 94  TRIG 174*  CHOLHDL 5.4   Thyroid Function Tests: Recent Labs    10/29/21 0549  TSH 3.477    Anemia Panel: No results for input(s): "VITAMINB12", "FOLATE", "FERRITIN", "TIBC", "IRON", "RETICCTPCT" in the last 72 hours. Sepsis Labs: Recent Labs  Lab 10/28/21 1847 10/28/21 2220  LATICACIDVEN 2.1* 1.3     Recent Results (from the past 240 hour(s))  SARS Coronavirus 2 by RT PCR (hospital order, performed in [facility] hospital lab) *cepheid single result test* Anterior Nasal Swab     Status: None   Collection Time: 10/28/21  6:55 PM   Specimen: Anterior Nasal Swab  Result Value Ref Range Status   SARS Coronavirus 2 by RT PCR NEGATIVE NEGATIVE Final    Comment: (NOTE) SARS-CoV-2 target nucleic acids are NOT DETECTED.  The SARS-CoV-2 RNA is generally detectable in upper and lower respiratory specimens during the acute phase of infection. The lowest concentration of SARS-CoV-2 viral copies this assay can detect is 250 copies / mL. A negative result does not preclude SARS-CoV-2 infection and should not be used as the sole basis for treatment or other patient management decisions.  A negative result may occur with improper specimen collection / handling, submission of specimen other than nasopharyngeal swab, presence of viral mutation(s) within the areas targeted by this assay, and inadequate number of viral copies (<250 copies / mL). A negative result must be combined with clinical observations, patient history, and epidemiological information.  Fact  Sheet for Patients:   https://www.patel.info/  Fact Sheet for Healthcare Providers: https://hall.com/  This test is not yet approved or  cleared by the [place] FDA and has been authorized for detection and/or diagnosis of SARS-CoV-2 by FDA under an Emergency Use Authorization (EUA).  This EUA will remain in effect (meaning this test can be used) for the duration of the COVID-19 declaration under Section 564(b)(1) of the Act, 21 U.S.C. section 360bbb-3(b)(1), unless the authorization is terminated or revoked sooner.  Performed at [facility], [address], [place], [place] [address]   Urine Culture     Status: Abnormal   Collection Time: 10/28/21  8:45 PM   Specimen: Urine, Clean Catch  Result Value Ref Range Status   Specimen Description   Final    URINE, CLEAN CATCH Performed at [facility], [address] [place], [place] [address]    Special Requests   Final    NONE Performed at [facility], [address] [place], [place] [address]    Culture MULTIPLE SPECIES PRESENT, SUGGEST RECOLLECTION (A)  Final   Report Status 10/29/2021 FINAL  Final  MRSA Next Gen by PCR, Nasal     Status: None   Collection Time: 10/29/21  2:04 AM   Specimen: Nasal Mucosa; Nasal Swab  Result Value Ref Range Status   MRSA by PCR Next Gen NOT DETECTED NOT DETECTED Final    Comment: (NOTE) The GeneXpert MRSA Assay (FDA approved for NASAL specimens only), is one component of a comprehensive MRSA colonization  surveillance program. It is not intended to diagnose MRSA infection nor to guide or monitor treatment for MRSA infections. Test performance is not FDA approved in patients less than 60 years old. Performed at [facility], [place] [address] [place], [place] [address]          Radiology Studies: DG C-Arm 1-60 Min-No Report  Result Date: 10/29/2021 Fluoroscopy was utilized by the  requesting physician.  No radiographic interpretation.   CT CHEST ABDOMEN PELVIS WO CONTRAST  Result Date: 10/28/2021 CLINICAL DATA:  Shortness of breath with vomiting and diarrhea. EXAM: CT CHEST, ABDOMEN AND PELVIS WITHOUT CONTRAST TECHNIQUE: Multidetector CT imaging of the chest, abdomen and pelvis was performed following the standard protocol without IV contrast. RADIATION DOSE REDUCTION: This exam was performed according to the departmental dose-optimization program which includes automated exposure control, adjustment of the mA and/or kV according to patient size and/or use of iterative reconstruction technique. COMPARISON:  None Available. FINDINGS: CT CHEST FINDINGS Cardiovascular: There is mild calcification of the aortic arch, without evidence of aortic aneurysm. Normal heart size with marked severity coronary artery calcification. There is a trace amount of pericardial fluid. Mediastinum/Nodes: No enlarged mediastinal, hilar, or axillary lymph nodes. Thyroid gland, trachea, and esophagus demonstrate no significant findings. Lungs/Pleura: Lungs are clear. No pleural effusion or pneumothorax. Musculoskeletal: No chest wall mass or suspicious bone lesions identified. CT ABDOMEN PELVIS FINDINGS Hepatobiliary: No focal liver abnormality is seen. Status post cholecystectomy. No biliary dilatation. Pancreas: Unremarkable. No pancreatic ductal dilatation or surrounding inflammatory changes. Spleen: Normal in size without focal abnormality. Adrenals/Urinary Tract: Adrenal glands are unremarkable. Kidneys are normal in size. Multiple bilateral renal cysts are seen. No additional follow-up or imaging is recommended. Numerous bilateral subcentimeter non-obstructing renal calculi are also noted. Adjacent 2 mm and 3 mm obstructing renal calculi are seen within the distal right ureter (axial CT image 107, CT series 2). There is mild right-sided hydronephrosis, hydroureter and periureteral inflammatory fat stranding.  Bladder is unremarkable. Stomach/Bowel: Stomach is within normal limits. The appendix is surgically absent no evidence of bowel wall thickening, distention, or inflammatory changes. Vascular/Lymphatic: Aortic atherosclerosis. No enlarged abdominal or pelvic lymph nodes. Reproductive: Status post hysterectomy. No adnexal masses. Other: No abdominal wall hernia or abnormality. No abdominopelvic ascites. Musculoskeletal: No acute or significant osseous findings. IMPRESSION: 1. Adjacent 2 mm and 3 mm obstructing renal calculi within the distal right ureter. 2. Numerous bilateral subcentimeter non-obstructing renal calculi. 3. Marked severity coronary artery calcification. 4. Trace amount of pericardial fluid. 5. Aortic atherosclerosis. Aortic Atherosclerosis (ICD10-I70.0). Electronically Signed   By: Virgina Norfolk M.D.   On: 10/28/2021 19:53   CT Head Wo Contrast  Result Date: 10/28/2021 CLINICAL DATA:  Shortness of breath with vomiting and diarrhea. EXAM: CT HEAD WITHOUT CONTRAST TECHNIQUE: Contiguous axial images were obtained from the base of the skull through the vertex without intravenous contrast. RADIATION DOSE REDUCTION: This exam was performed according to the departmental dose-optimization program which includes automated exposure control, adjustment of the mA and/or kV according to patient size and/or use of iterative reconstruction technique. COMPARISON:  Oct 08, 2021 FINDINGS: Brain: No evidence of acute infarction, hemorrhage, hydrocephalus, extra-axial collection or mass lesion/mass effect. Vascular: A metallic density right MCA bifurcation aneurysm clip is seen within the anterolateral aspect of the anterolateral temporoparietal region. Skull: A right frontotemporal craniotomy defect is seen. Sinuses/Orbits: No acute finding. Other: None. IMPRESSION: 1. No acute intracranial abnormality. 2. Right frontotemporal craniotomy defect with an adjacent metallic density right MCA  bifurcation aneurysm  clip. Electronically Signed   By: Virgina Norfolk M.D.   On: 10/28/2021 19:49   DG Chest 2 View  Result Date: 10/28/2021 CLINICAL DATA:  Shortness of breath.  Tightness in chest. EXAM: CHEST - 2 VIEW COMPARISON:  PA chest and right rib radiographs 04/12/2018; chest two views 03/07/2015 FINDINGS: Cardiac silhouette and mediastinal contours are within normal limits with calcification again seen within the aortic arch. There is flattening of the diaphragms and moderate hyperinflation. Increased lucencies within the upper lungs are again seen suggesting chronic cystic emphysematous changes. No focal airspace opacity to indicate pneumonia. No pleural effusion or pneumothorax. Mild-to-moderate multilevel degenerative disc changes of the thoracic spine. Cholecystectomy clips. IMPRESSION: 1. Chronic hyperinflation and findings suggesting emphysematous change. 2. No acute lung process. Electronically Signed   By: Yvonne Kendall M.D.   On: 10/28/2021 17:11        Scheduled Meds:  Chlorhexidine Gluconate Cloth  6 each Topical Daily   insulin aspart  0-15 Units Subcutaneous Q4H   insulin detemir  15 Units Subcutaneous Daily   propranolol ER  60 mg Oral BID   Continuous Infusions:  cefTRIAXone (ROCEPHIN)  IV Stopped (10/29/21 1803)   dextrose 5% lactated ringers Stopped (10/29/21 1400)   insulin Stopped (10/29/21 1400)   lactated ringers Stopped (10/28/21 2039)     LOS: 1 day    Time spent:40 min    Katlyn Muldrew, Geraldo Docker, MD [facility]   If 7PM-7AM, please contact night-coverage 10/30/2021, 9:29 AM

## 2021-10-30 NOTE — Progress Notes (Signed)
Inpatient Diabetes Program Recommendations  AACE/ADA: New Consensus Statement on Inpatient Glycemic Control (2015)  Target Ranges:  Prepandial:   less than 140 mg/dL      Peak postprandial:   less than 180 mg/dL (1-2 hours)      Critically ill patients:  140 - 180 mg/dL   Lab Results  Component Value Date   GLUCAP 219 (H) 10/30/2021   HGBA1C 8.8 (H) 10/29/2021    Review of Glycemic Control  Diabetes history: DM2 Outpatient Diabetes medications: Lantus 50 QHS, Invokanamet 50/1000 mg 2 tabs QAM Current orders for Inpatient glycemic control: Levemir 15 QD, Novolog 0-15 units Q4H  HgbA1C - 8.8%  Inpatient Diabetes Program Recommendations:    Change Novolog 0-15 units to TID with meals and 0-5 HS  Add Novolog 3 units TID with meals if eating > 50%  If FBS > 180 mg/dL, increase Levemir to 20 units QD  Spoke with pt about her diabetes control at home. States she's been eating a lot of strawberries lately and hasn't been checking blood sugars like she should. Discussed HgbA1C of 8.8% and goal of < 7.5% to prevent long and short-term complications. Blood sugars trending well until lunch today - 239. May benefit from meal coverage insulin if eating > 50%. Answered all questons. Stressed importance of f/u with PCP and take blood sugar log for review.   Continue to follow.   Thank you. Lorenda Peck, RD, LDN, CDE Inpatient Diabetes Coordinator [phone]

## 2021-10-31 DIAGNOSIS — N179 Acute kidney failure, unspecified: Secondary | ICD-10-CM | POA: Diagnosis not present

## 2021-10-31 DIAGNOSIS — N135 Crossing vessel and stricture of ureter without hydronephrosis: Secondary | ICD-10-CM | POA: Diagnosis not present

## 2021-10-31 DIAGNOSIS — E039 Hypothyroidism, unspecified: Secondary | ICD-10-CM | POA: Diagnosis not present

## 2021-10-31 DIAGNOSIS — E1010 Type 1 diabetes mellitus with ketoacidosis without coma: Secondary | ICD-10-CM | POA: Diagnosis not present

## 2021-10-31 LAB — CBC WITH DIFFERENTIAL/PLATELET
Abs Immature Granulocytes: 0.02 10*3/uL (ref 0.00–0.07)
Basophils Absolute: 0.1 10*3/uL (ref 0.0–0.1)
Basophils Relative: 1 %
Eosinophils Absolute: 0.2 10*3/uL (ref 0.0–0.5)
Eosinophils Relative: 3 %
HCT: 34.5 % — ABNORMAL LOW (ref 36.0–46.0)
Hemoglobin: 10.9 g/dL — ABNORMAL LOW (ref 12.0–15.0)
Immature Granulocytes: 0 %
Lymphocytes Relative: 45 %
Lymphs Abs: 3.5 10*3/uL (ref 0.7–4.0)
MCH: 24.9 pg — ABNORMAL LOW (ref 26.0–34.0)
MCHC: 31.6 g/dL (ref 30.0–36.0)
MCV: 78.8 fL — ABNORMAL LOW (ref 80.0–100.0)
Monocytes Absolute: 0.7 10*3/uL (ref 0.1–1.0)
Monocytes Relative: 9 %
Neutro Abs: 3.3 10*3/uL (ref 1.7–7.7)
Neutrophils Relative %: 42 %
Platelets: 261 10*3/uL (ref 150–400)
RBC: 4.38 MIL/uL (ref 3.87–5.11)
RDW: 14.8 % (ref 11.5–15.5)
WBC: 7.8 10*3/uL (ref 4.0–10.5)
nRBC: 0 % (ref 0.0–0.2)

## 2021-10-31 LAB — COMPREHENSIVE METABOLIC PANEL
ALT: 9 U/L (ref 0–44)
AST: 13 U/L — ABNORMAL LOW (ref 15–41)
Albumin: 2.7 g/dL — ABNORMAL LOW (ref 3.5–5.0)
Alkaline Phosphatase: 60 U/L (ref 38–126)
Anion gap: 8 (ref 5–15)
BUN: 10 mg/dL (ref 8–23)
CO2: 26 mmol/L (ref 22–32)
Calcium: 8.4 mg/dL — ABNORMAL LOW (ref 8.9–10.3)
Chloride: 105 mmol/L (ref 98–111)
Creatinine, Ser: 0.59 mg/dL (ref 0.44–1.00)
GFR, Estimated: >60 mL/min (ref >60)
Glucose, Bld: 186 mg/dL — ABNORMAL HIGH (ref 70–99)
Potassium: 3.8 mmol/L (ref 3.5–5.1)
Sodium: 139 mmol/L (ref 135–145)
Total Bilirubin: 0.7 mg/dL (ref 0.3–1.2)
Total Protein: 5.8 g/dL — ABNORMAL LOW (ref 6.5–8.1)

## 2021-10-31 LAB — GLUCOSE, CAPILLARY
Glucose-Capillary: 167 mg/dL — ABNORMAL HIGH (ref 70–99)
Glucose-Capillary: 165 mg/dL — ABNORMAL HIGH (ref 70–99)
Glucose-Capillary: 239 mg/dL — ABNORMAL HIGH (ref 70–99)
Glucose-Capillary: 124 mg/dL — ABNORMAL HIGH (ref 70–99)
Glucose-Capillary: 162 mg/dL — ABNORMAL HIGH (ref 70–99)

## 2021-10-31 LAB — PHOSPHORUS: Phosphorus: 3.9 mg/dL (ref 2.5–4.6)

## 2021-10-31 LAB — MAGNESIUM: Magnesium: 1.4 mg/dL — ABNORMAL LOW (ref 1.7–2.4)

## 2021-10-31 MED ORDER — CIPROFLOXACIN HCL 500 MG PO TABS
500.0000 mg | ORAL_TABLET | Freq: Two times a day (BID) | ORAL | Status: DC
  Administered 2021-10-31 – 2021-11-01 (×2): 500 mg via ORAL
  Filled 2021-10-31 (×2): qty 1

## 2021-10-31 MED ORDER — MAGNESIUM SULFATE 2 GM/50ML IV SOLN
2.0000 g | Freq: Once | INTRAVENOUS | Status: AC
  Administered 2021-10-31: 2 g via INTRAVENOUS
  Filled 2021-10-31: qty 50

## 2021-10-31 MED ORDER — TRAZODONE HCL 50 MG PO TABS
50.0000 mg | ORAL_TABLET | Freq: Every evening | ORAL | Status: DC | PRN
  Administered 2021-10-31: 50 mg via ORAL
  Filled 2021-10-31: qty 1

## 2021-10-31 NOTE — Progress Notes (Signed)
  Transition of Care [facility]) Screening Note   Patient Details  Name: Laura Gentry Date of Birth: [birth year]   Transition of Care [facility]) CM/SW Contact:    Lennart Pall, LCSW Phone Number: 10/31/2021, 10:53 AM    Transition of Care Department [facility]) has reviewed patient and no TOC needs have been identified at this time. We will continue to monitor patient advancement through interdisciplinary progression rounds. If new patient transition needs arise, please place a TOC consult.

## 2021-10-31 NOTE — Progress Notes (Addendum)
PROGRESS NOTE    Laura Gentry  FHL:[id number] DOB: 1956-03-13 DOA: 10/28/2021 PCP: Seward Carol, MD     Brief Narrative:  66 y.o. WF PMHx nephrolithiasis, DM type 2 insulin-dependent, HLD, thyroid disease, migraines, brain aneurysm, lumbosacral radiculopathy  Admitted to [facility] on 10/28/2021 by way of transfer from [place] emergency department with obstructing right lithiasis after presenting from home to the latter complaining of nausea/vomiting.   The patient reports 3 to 4 days of intermittent nausea resulting in at least 2-3 episodes per day of nonbloody, nonbilious emesis over that timeframe, with most recent such episode of emesis occurring just prior to presenting to [place] emergency department earlier in the day.  She notes that this has been associated with intermittent sharp right-sided flank pain as well as some new onset gross hematuria in the absence of any overt dysuria.  She is also noted subjective fever over the last 1 to 2 days, in the absence of chills, full body rigors, or generalized myalgias.  In the context of her recent nausea/vomiting, the patient reports a significant decline in her oral intake over the course of the last few days.  No recent trauma or travel.  No recent diarrhea, melena, hematochezia, rash.   Over the last 1 to 2 days, she is also noted some mild shortness of breath at rest in the absence of any orthopnea, PND, or worsening of peripheral edema.  Not associate with any chest pain, cough, mopped assist, palpitations, diaphoresis, dizziness, presyncope, or syncope.  Denies any known chronic underlying pulmonary pathology.  She has a mild bilateral frontal lobe headache, which she suspects is on the basis of dehydration and limited oral intake over the last few days.  Weakness, acute focal numbness, paresthesias, acute change in vision, slurred speech, dysarthria, or vertigo.   Technologist a history of  insulin-dependent type 2 diabetes mellitus, with chart review revealing most recent hemoglobin A1c to be 6.9% in 11/2017.  Outpatient insulin regimen reported to be Lantus 25 units SQ nightly as well as metformin and canagliflozin.    Not on any blood thinners as an outpatient, including no aspirin.   She conveys a history of kidney stone several years ago.    Subjective: 6/9 febrile overnight A/O x4, negative abdominal pain, negative dysuria, only complaint is difficulty sleeping.   Assessment & Plan: Covid vaccination;   Principal Problem:   Ureteral obstruction, right Active Problems:   UTI (urinary tract infection)   Severe sepsis (HCC)   DKA (diabetic ketoacidosis) (HCC)   AKI (acute kidney injury) ([place])   Acquired hypothyroidism  Obstructing right ureteral stones -In the setting of presenting 3 to 4 days of nausea/vomiting associated with new onset right flank discomfort, subjective fever, with CT abdomen/pelvis showing 2 mm and 3 mm obstructing renal calculi within the distal right ureter with associated mild right-sided hydronephrosis, hydroureter, periureteral inflammatory fat stranding.  Appears associated with presenting severe sepsis due to suspected urinary tract infection wanting source control, as further detailed below. -EDP discussed the patient's case and imaging with the on-call urologist, Dr. Tresa Moore, Requested admission to the hospitalist service at [facility], where Dr. Tresa Moore, will formally consult and see the patient in morning.  He requested the patient be kept n.p.o. for potential ureteroscopy with stent placement at that time. -6/7 s/p cystoscopy with bilateral stent placement.  See below - 6/7 patient pain not adequately controlled - 6/7 pain control  1.  6/8 Morphine 2  mg q 3hr PRN (hold)  2.  Ativan 1 mg x 1 dose  3.  6/8 Percocet 7.5-325 mg PRN  4.  6/8 Fentanyl 25 mcg PRN if Percocet does not alleviate pain -6/9 per urology Dr. Alexis Frock okay to  transition patient to Ciprofloxacin 500 mg BID x10 days -6/9 Urology to arrange outpatient ureteroscopy with goal of stone free in elective setting in several weeks  Severe sepsis/Complicated UTI - On admission presenting leukocytosis, tachycardia. Lactic acid level: -Continue current antibiotic x14 days  DKA/DM type II uncontrolled with hyperglycemia -Endo tool -6/7 Levemir 15 units now, continue Endo tool for 2 hours after administration and then discontinue. - 6/7 Moderate SSI -6/9 now that patient is consuming adequate food and drink D/C IV D5   Essential HTN - 6/7 Propranolol ER 60 mg BID (home dose 40 mg TID: We do not have that product) -6/7 Hydralazine IV 10 mg q 6 hr PRN SBP> 150 or DBP> 100   AKI - Presenting serum creatinine 1.38 (most recent prior value of 0.94 July 2019).   -Multifactorial UTI, urolithiasis,  Recurrent Urolithiasis   -Pre 2023 - SWL x several, URS x 1 -10/2021 - 27m Rt distal ureteral stone with very mild hydro and punctate bilateral renal stones on ER CT 10/2021 on eval abdominal pain. C r 1.3. UA with pyuria, -nitirtes.   Acute urinary retention - 6/7 in and out cath -Strict in and out +2.8 L - 6/7 if patient continues to retain urine will place Foley catheter -6/8 resolved  Acquired hypothyroidism - 6/7 TSH= 3.4 WNL - Patient not on any thyroid medication on admission - 6/8 states takes natural hormone replacement.  Hypokalemia - Potassium goal> 4 - 6/8 potassium po 40 mEq  Hypomagnesmia - Magnesium goal> 2 - 6/9 magnesium IV 2 g     Mobility Assessment (last 72 hours)     Mobility Assessment     Row Name 10/30/21 2200           Does patient have an order for bedrest or is patient medically unstable No - Continue assessment       What is the highest level of mobility based on the progressive mobility assessment? Level 6 (Walks independently in room and hall) - Balance while walking in room without assist - Complete                   Interdisciplinary Goals of Care Family Meeting   Date carried out: 10/31/2021  Location of the meeting:   Member's involved:   Durable Power of ATour manager     Discussion: We discussed goals of care for [name].    Code status:   Disposition:   Time spent for the meeting:     Prima Rayner J, MD  10/31/2021, 10:32 AM         DVT prophylaxis:  Code Status:  Family Communication:  Status is: Inpatient    Dispo: The patient is from: Home              Anticipated d/c is to: Home              Anticipated d/c date is:2 days              Patient currently is not medically stable to d/c.      Consultants:  Urology Dr. TAlexis Frock   Procedures/Significant Events:  6/7 CYSTOSCOPY WITH RETROGRADE PYELOGRAM, URETEROSCOPY AND  STENT PLACEMENT (Bilateral)  I have personally reviewed and interpreted all radiology studies and my findings are as above.  VENTILATOR SETTINGS:    Cultures   Antimicrobials: Anti-infectives (From admission, onward)    Start     Ordered Stop   10/29/21 1800  cefTRIAXone (ROCEPHIN) 1 g in sodium chloride 0.9 % 100 mL IVPB        10/29/21 1401     10/28/21 2015  meropenem (MERREM) 1 g in sodium chloride 0.9 % 100 mL IVPB  Status:  Discontinued        10/28/21 2004 10/28/21 2013   10/28/21 2015  meropenem (MERREM) 1 g in sodium chloride 0.9 % 100 mL IVPB  Status:  Discontinued        10/28/21 2013 10/29/21 1401         Devices    LINES / TUBES:  Bilateral ureteral stents 6/7>>>    Continuous Infusions:  cefTRIAXone (ROCEPHIN)  IV Stopped (10/30/21 1730)   dextrose 5% lactated ringers 125 mL/hr at 10/31/21 0823   insulin Stopped (10/29/21 1400)   lactated ringers Stopped (10/28/21 2039)     Objective: Vitals:   10/30/21 2040 10/31/21 0145 10/31/21 0449 10/31/21 0500  BP: (!) 142/66 111/60 131/66   Pulse: 79 67 70   Resp: '18 18 18   '$ Temp: 99.3 F (37.4 C)  98.7 F (37.1 C) 98.1 F (36.7 C)   TempSrc: Oral Oral Oral   SpO2: 99% 97% 96%   Weight:    70.6 kg  Height:        Intake/Output Summary (Last 24 hours) at 10/31/2021 1032 Last data filed at 10/31/2021 0962 Gross per 24 hour  Intake 3300.86 ml  Output 3200 ml  Net 100.86 ml    Filed Weights   10/28/21 1645 10/29/21 0200 10/31/21 0500  Weight: 69.7 kg 67.3 kg 70.6 kg    Examination:  General: A/O x4, No acute respiratory distress Eyes: negative scleral hemorrhage, negative anisocoria, negative icterus ENT: Negative Runny nose, negative gingival bleeding, Neck:  Negative scars, masses, torticollis, lymphadenopathy, JVD Lungs: tachypnea clear to auscultation bilaterally without wheezes or crackles Cardiovascular: Tachycardic without murmur gallop or rub normal S1 and S2 Abdomen: Negative abdominal pain, nondistended, positive soft, bowel sounds, no rebound, no ascites, no appreciable mass Extremities: No significant cyanosis, clubbing, or edema bilateral lower extremities Skin: Negative rashes, lesions, ulcers Psychiatric:  Negative depression, negative anxiety, negative fatigue, negative mania  Central nervous system:  Cranial nerves II through XII intact, tongue/uvula midline, all extremities muscle strength 5/5, sensation intact throughout, negative dysarthria, negative expressive aphasia, negative receptive aphasia.  .     Data Reviewed: Care during the described time interval was provided by me .  I have reviewed this patient's available data, including medical history, events of note, physical examination, and all test results as part of my evaluation.  CBC: Recent Labs  Lab 10/28/21 1652 10/29/21 0254 10/30/21 0259 10/31/21 0436  WBC 14.8* 11.9* 8.4 7.8  NEUTROABS 10.5* 6.8 4.3 3.3  HGB 14.8 12.6 11.7* 10.9*  HCT 45.0 38.5 37.2 34.5*  MCV 75.3* 77.6* 78.8* 78.8*  PLT 414* 282 296 836    Basic Metabolic Panel: Recent Labs  Lab 10/28/21 2013 10/29/21 0254  10/29/21 1008 10/30/21 0259 10/31/21 0436  NA 135 139 138 143 139  K 3.4* 3.2* 3.5 3.4* 3.8  CL 103 106 107 110 105  CO2 21* '23 22 26 26  '$ GLUCOSE 235* 153* 159* 71 186*  BUN  25* '23 17 11 10  '$ CREATININE 1.30* 0.92 0.75 0.66 0.59  CALCIUM 8.2* 8.8* 8.5* 8.6* 8.4*  MG  --  1.7  --  1.7 1.4*  PHOS  --   --   --  3.7 3.9    GFR: Estimated Creatinine Clearance: 67.3 mL/min (by C-G formula based on SCr of 0.59 mg/dL). Liver Function Tests: Recent Labs  Lab 10/28/21 1652 10/29/21 0254 10/30/21 0259 10/31/21 0436  AST 24 18 11* 13*  ALT '15 10 10 9  '$ ALKPHOS 102 75 66 60  BILITOT 2.0* 1.6* 0.8 0.7  PROT 8.6* 6.7 6.0* 5.8*  ALBUMIN 4.0 3.3* 2.8* 2.7*    Recent Labs  Lab 10/28/21 1847  LIPASE 24    No results for input(s): "AMMONIA" in the last 168 hours. Coagulation Profile: Recent Labs  Lab 10/29/21 0549  INR 1.0    Cardiac Enzymes: No results for input(s): "CKTOTAL", "CKMB", "CKMBINDEX", "TROPONINI" in the last 168 hours. BNP (last 3 results) No results for input(s): "PROBNP" in the last 8760 hours. HbA1C: Recent Labs    10/29/21 0330  HGBA1C 8.8*    CBG: Recent Labs  Lab 10/30/21 1649 10/30/21 1953 10/30/21 2342 10/31/21 0358 10/31/21 0730  GLUCAP 196* 219* 178* 167* 165*    Lipid Profile: Recent Labs    10/29/21 1008  CHOL 158  HDL 29*  LDLCALC 94  TRIG 174*  CHOLHDL 5.4    Thyroid Function Tests: Recent Labs    10/29/21 0549  TSH 3.477    Anemia Panel: No results for input(s): "VITAMINB12", "FOLATE", "FERRITIN", "TIBC", "IRON", "RETICCTPCT" in the last 72 hours. Sepsis Labs: Recent Labs  Lab 10/28/21 1847 10/28/21 2220  LATICACIDVEN 2.1* 1.3     Recent Results (from the past 240 hour(s))  SARS Coronavirus 2 by RT PCR (hospital order, performed in [facility] hospital lab) *cepheid single result test* Anterior Nasal Swab     Status: None   Collection Time: 10/28/21  6:55 PM   Specimen: Anterior Nasal Swab  Result Value Ref  Range Status   SARS Coronavirus 2 by RT PCR NEGATIVE NEGATIVE Final    Comment: (NOTE) SARS-CoV-2 target nucleic acids are NOT DETECTED.  The SARS-CoV-2 RNA is generally detectable in upper and lower respiratory specimens during the acute phase of infection. The lowest concentration of SARS-CoV-2 viral copies this assay can detect is 250 copies / mL. A negative result does not preclude SARS-CoV-2 infection and should not be used as the sole basis for treatment or other patient management decisions.  A negative result may occur with improper specimen collection / handling, submission of specimen other than nasopharyngeal swab, presence of viral mutation(s) within the areas targeted by this assay, and inadequate number of viral copies (<250 copies / mL). A negative result must be combined with clinical observations, patient history, and epidemiological information.  Fact Sheet for Patients:   https://www.patel.info/  Fact Sheet for Healthcare Providers: https://hall.com/  This test is not yet approved or  cleared by the [place] FDA and has been authorized for detection and/or diagnosis of SARS-CoV-2 by FDA under an Emergency Use Authorization (EUA).  This EUA will remain in effect (meaning this test can be used) for the duration of the COVID-19 declaration under Section 564(b)(1) of the Act, 21 U.S.C. section 360bbb-3(b)(1), unless the authorization is terminated or revoked sooner.  Performed at [facility], [address] [place], [place] [address]   Urine Culture     Status: Abnormal  Collection Time: 10/28/21  8:45 PM   Specimen: Urine, Clean Catch  Result Value Ref Range Status   Specimen Description   Final    URINE, CLEAN CATCH Performed at [facility], [address] [place], [place] [address]    Special Requests   Final    NONE Performed at [facility], [address] [place], [place] [address]    Culture MULTIPLE SPECIES PRESENT, SUGGEST RECOLLECTION (A)  Final   Report Status 10/29/2021 FINAL  Final  MRSA Next Gen by PCR, Nasal     Status: None   Collection Time: 10/29/21  2:04 AM   Specimen: Nasal Mucosa; Nasal Swab  Result Value Ref Range Status   MRSA by PCR Next Gen NOT DETECTED NOT DETECTED Final    Comment: (NOTE) The GeneXpert MRSA Assay (FDA approved for NASAL specimens only), is one component of a comprehensive MRSA colonization surveillance program. It is not intended to diagnose MRSA infection nor to guide or monitor treatment for MRSA infections. Test performance is not FDA approved in patients less than 59 years old. Performed at [facility], [place] [address] [place], [place] [address]          Radiology Studies: No results found.      Scheduled Meds:  Chlorhexidine Gluconate Cloth  6 each Topical Daily   insulin aspart  0-15 Units Subcutaneous Q4H   insulin detemir  15 Units Subcutaneous Daily   propranolol ER  60 mg Oral BID   Continuous Infusions:  cefTRIAXone (ROCEPHIN)  IV Stopped (10/30/21 1730)   dextrose 5% lactated ringers 125 mL/hr at 10/31/21 0823   insulin Stopped (10/29/21 1400)   lactated ringers Stopped (10/28/21 2039)     LOS: 2 days    Time spent:40 min    Alauna Hayden, Geraldo Docker, MD [facility]   If 7PM-7AM, please contact night-coverage 10/31/2021, 10:32 AM

## 2021-11-01 DIAGNOSIS — N10 Acute tubulo-interstitial nephritis: Secondary | ICD-10-CM

## 2021-11-01 DIAGNOSIS — E1010 Type 1 diabetes mellitus with ketoacidosis without coma: Secondary | ICD-10-CM | POA: Diagnosis not present

## 2021-11-01 DIAGNOSIS — E0810 Diabetes mellitus due to underlying condition with ketoacidosis without coma: Secondary | ICD-10-CM

## 2021-11-01 DIAGNOSIS — E039 Hypothyroidism, unspecified: Secondary | ICD-10-CM | POA: Diagnosis not present

## 2021-11-01 DIAGNOSIS — N179 Acute kidney failure, unspecified: Secondary | ICD-10-CM | POA: Diagnosis not present

## 2021-11-01 DIAGNOSIS — N135 Crossing vessel and stricture of ureter without hydronephrosis: Secondary | ICD-10-CM | POA: Diagnosis not present

## 2021-11-01 LAB — COMPREHENSIVE METABOLIC PANEL
ALT: 12 U/L (ref 0–44)
AST: 14 U/L — ABNORMAL LOW (ref 15–41)
Albumin: 3 g/dL — ABNORMAL LOW (ref 3.5–5.0)
Alkaline Phosphatase: 68 U/L (ref 38–126)
Anion gap: 8 (ref 5–15)
BUN: 9 mg/dL (ref 8–23)
CO2: 26 mmol/L (ref 22–32)
Calcium: 8.6 mg/dL — ABNORMAL LOW (ref 8.9–10.3)
Chloride: 107 mmol/L (ref 98–111)
Creatinine, Ser: 0.7 mg/dL (ref 0.44–1.00)
GFR, Estimated: >60 mL/min (ref >60)
Glucose, Bld: 163 mg/dL — ABNORMAL HIGH (ref 70–99)
Potassium: 3.2 mmol/L — ABNORMAL LOW (ref 3.5–5.1)
Sodium: 141 mmol/L (ref 135–145)
Total Bilirubin: 0.8 mg/dL (ref 0.3–1.2)
Total Protein: 6.2 g/dL — ABNORMAL LOW (ref 6.5–8.1)

## 2021-11-01 LAB — CBC WITH DIFFERENTIAL/PLATELET
Abs Immature Granulocytes: 0.04 10*3/uL (ref 0.00–0.07)
Basophils Absolute: 0.1 10*3/uL (ref 0.0–0.1)
Basophils Relative: 1 %
Eosinophils Absolute: 0.1 10*3/uL (ref 0.0–0.5)
Eosinophils Relative: 1 %
HCT: 35.3 % — ABNORMAL LOW (ref 36.0–46.0)
Hemoglobin: 10.9 g/dL — ABNORMAL LOW (ref 12.0–15.0)
Immature Granulocytes: 0 %
Lymphocytes Relative: 12 %
Lymphs Abs: 1.1 10*3/uL (ref 0.7–4.0)
MCH: 24.3 pg — ABNORMAL LOW (ref 26.0–34.0)
MCHC: 30.9 g/dL (ref 30.0–36.0)
MCV: 78.6 fL — ABNORMAL LOW (ref 80.0–100.0)
Monocytes Absolute: 0.6 10*3/uL (ref 0.1–1.0)
Monocytes Relative: 6 %
Neutro Abs: 7.8 10*3/uL — ABNORMAL HIGH (ref 1.7–7.7)
Neutrophils Relative %: 80 %
Platelets: 247 10*3/uL (ref 150–400)
RBC: 4.49 MIL/uL (ref 3.87–5.11)
RDW: 14.6 % (ref 11.5–15.5)
WBC: 9.7 10*3/uL (ref 4.0–10.5)
nRBC: 0 % (ref 0.0–0.2)

## 2021-11-01 LAB — GLUCOSE, CAPILLARY
Glucose-Capillary: 131 mg/dL — ABNORMAL HIGH (ref 70–99)
Glucose-Capillary: 160 mg/dL — ABNORMAL HIGH (ref 70–99)
Glucose-Capillary: 164 mg/dL — ABNORMAL HIGH (ref 70–99)
Glucose-Capillary: 221 mg/dL — ABNORMAL HIGH (ref 70–99)

## 2021-11-01 LAB — PHOSPHORUS: Phosphorus: 3.8 mg/dL (ref 2.5–4.6)

## 2021-11-01 LAB — MAGNESIUM: Magnesium: 1.6 mg/dL — ABNORMAL LOW (ref 1.7–2.4)

## 2021-11-01 MED ORDER — OXYCODONE-ACETAMINOPHEN 7.5-325 MG PO TABS: 1.0000 | ORAL_TABLET | Freq: Four times a day (QID) | ORAL | 0 refills | Status: DC | PRN

## 2021-11-01 MED ORDER — MAGNESIUM SULFATE 4 GM/100ML IV SOLN
4.0000 g | Freq: Once | INTRAVENOUS | Status: AC
Start: 2021-11-01 — End: 2021-11-01
  Administered 2021-11-01: 4 g via INTRAVENOUS
  Filled 2021-11-01: qty 100

## 2021-11-01 MED ORDER — POTASSIUM CHLORIDE CRYS ER 10 MEQ PO TBCR
50.0000 meq | EXTENDED_RELEASE_TABLET | Freq: Once | ORAL | Status: AC
  Administered 2021-11-01: 50 meq via ORAL
  Filled 2021-11-01: qty 1

## 2021-11-01 MED ORDER — CIPROFLOXACIN HCL 500 MG PO TABS: 500.0000 mg | ORAL_TABLET | Freq: Two times a day (BID) | ORAL | 0 refills | Status: DC

## 2021-11-01 MED ORDER — TRAZODONE HCL 50 MG PO TABS: 50.0000 mg | ORAL_TABLET | Freq: Every evening | ORAL | 0 refills | Status: DC | PRN

## 2021-11-01 NOTE — Progress Notes (Signed)
Assessment unchanged. Pt verbalized understanding  of dc instructions including medications and follow up care. Discharged via wc to front entrance accompanied by NT. 

## 2021-11-01 NOTE — Discharge Summary (Signed)
Physician Discharge Summary  Dineen Conradt HOZ:[id number] DOB: Dec 22, 1955 DOA: 10/28/2021  PCP: Seward Carol, MD  Admit date: 10/28/2021 Discharge date: 11/01/2021  Time spent: 35 minutes  Recommendations for Outpatient Follow-up:    Obstructing right ureteral stones -In the setting of presenting 3 to 4 days of nausea/vomiting associated with new onset right flank discomfort, subjective fever, with CT abdomen/pelvis showing 2 mm and 3 mm obstructing renal calculi within the distal right ureter with associated mild right-sided hydronephrosis, hydroureter, periureteral inflammatory fat stranding.  Appears associated with presenting severe sepsis due to suspected urinary tract infection wanting source control, as further detailed below. -EDP discussed the patient's case and imaging with the on-call urologist, Dr. Tresa Moore, Requested admission to the hospitalist service at [facility], where Dr. Tresa Moore, will formally consult and see the patient in morning.  He requested the patient be kept n.p.o. for potential ureteroscopy with stent placement at that time. -6/7 s/p cystoscopy with bilateral stent placement.  See below - 6/7 patient pain not adequately controlled - 6/7 pain control             1.  6/8 Morphine 2 mg q 3hr PRN (hold)             2.  Ativan 1 mg x 1 dose             3.  6/8 Percocet 7.5-325 mg PRN             4.  6/8 Fentanyl 25 mcg PRN if Percocet does not alleviate pain -6/9 per urology Dr. Alexis Frock okay to transition patient to Ciprofloxacin 500 mg BID x10 days -6/9 Urology to arrange outpatient ureteroscopy with goal of stone free in elective setting in several weeks   Severe sepsis/Complicated UTI - On admission presenting leukocytosis, tachycardia. Lactic acid level: -Continue current antibiotic x14 days   DKA/DM type II uncontrolled with hyperglycemia -6/7 hemoglobin A1c= 8.8 -Endo tool -6/7 Levemir 15 units now, continue Endo tool for 2 hours after administration  and then discontinue. - 6/7 Moderate SSI -6/9 now that patient is consuming adequate food and drink D/C IV D5   Essential HTN - 6/7 Propranolol ER 60 mg BID (home dose 40 mg TID: We do not have that product) -6/7 Hydralazine IV 10 mg q 6 hr PRN SBP> 150 or DBP> 100 -6/10 restart home medication at discharge   AKI Lab Results  Component Value Date   CREATININE 0.70 11/01/2021   CREATININE 0.59 10/31/2021   CREATININE 0.66 10/30/2021   CREATININE 0.75 10/29/2021   CREATININE 0.92 10/29/2021  - Presenting serum creatinine 1.38 (most recent prior value of 0.94 July 2019).   -Multifactorial UTI, urolithiasis, -Resolved   Recurrent Urolithiasis   -Pre 2023 - SWL x several, URS x 1 -10/2021 - 53m Rt distal ureteral stone with very mild hydro and punctate bilateral renal stones on ER CT 10/2021 on eval abdominal pain. C r 1.3. UA with pyuria, -nitirtes. -See obstructing ureteral stones   Acute urinary retention - 6/7 in and out cath -Strict in and out  +1.9 L - 6/7 if patient continues to retain urine will place Foley catheter -6/8 resolved   Acquired hypothyroidism - 6/7 TSH= 3.4 WNL - Patient not on any thyroid medication on admission - 6/8 states takes natural hormone replacement.   Hypokalemia - Potassium goal> 4 - 6/10 K-Dur po 50 mEq administered prior to discharge   Hypomagnesmia - Magnesium goal> 2 - 6/10 Magnesium IV 2 g  administered prior to discharge    Discharge Diagnoses:  Principal Problem:   Ureteral obstruction, right Active Problems:   UTI (urinary tract infection)   Severe sepsis (HCC)   DKA (diabetic ketoacidosis) (HCC)   AKI (acute kidney injury) ([place])   Acquired hypothyroidism   Discharge Condition: Stable  Diet recommendation: Carb modified  Filed Weights   10/29/21 0200 10/31/21 0500 11/01/21 0500  Weight: 67.3 kg 70.6 kg 71.5 kg    History of present illness:  66 y.o. WF PMHx nephrolithiasis, DM type 2 insulin-dependent, HLD, thyroid  disease, migraines, brain aneurysm, lumbosacral radiculopathy   Admitted to [facility] on 10/28/2021 by way of transfer from [place] emergency department with obstructing right lithiasis after presenting from home to the latter complaining of nausea/vomiting.   The patient reports 3 to 4 days of intermittent nausea resulting in at least 2-3 episodes per day of nonbloody, nonbilious emesis over that timeframe, with most recent such episode of emesis occurring just prior to presenting to [place] emergency department earlier in the day.  She notes that this has been associated with intermittent sharp right-sided flank pain as well as some new onset gross hematuria in the absence of any overt dysuria.  She is also noted subjective fever over the last 1 to 2 days, in the absence of chills, full body rigors, or generalized myalgias.  In the context of her recent nausea/vomiting, the patient reports a significant decline in her oral intake over the course of the last few days.  No recent trauma or travel.  No recent diarrhea, melena, hematochezia, rash.   Over the last 1 to 2 days, she is also noted some mild shortness of breath at rest in the absence of any orthopnea, PND, or worsening of peripheral edema.  Not associate with any chest pain, cough, mopped assist, palpitations, diaphoresis, dizziness, presyncope, or syncope.  Denies any known chronic underlying pulmonary pathology.  She has a mild bilateral frontal lobe headache, which she suspects is on the basis of dehydration and limited oral intake over the last few days.  Weakness, acute focal numbness, paresthesias, acute change in vision, slurred speech, dysarthria, or vertigo.   Technologist a history of insulin-dependent type 2 diabetes mellitus, with chart review revealing most recent hemoglobin A1c to be 6.9% in 11/2017.  Outpatient insulin regimen reported to be Lantus 25 units SQ nightly as well as metformin and  canagliflozin.    Not on any blood thinners as an outpatient, including no aspirin.   She conveys a history of kidney stone several years ago.   Hospital Course:  See above  Procedures: 6/7 CYSTOSCOPY WITH RETROGRADE PYELOGRAM, URETEROSCOPY AND STENT PLACEMENT (Bilateral)  Consultations: Urology Dr. Alexis Frock  Cultures    Antibiotics Anti-infectives (From admission, onward)    Start     Ordered Stop   10/31/21 2000  ciprofloxacin (CIPRO) tablet 500 mg        10/31/21 1304     10/29/21 1800  cefTRIAXone (ROCEPHIN) 1 g in sodium chloride 0.9 % 100 mL IVPB  Status:  Discontinued        10/29/21 1401 10/31/21 1304   10/28/21 2015  meropenem (MERREM) 1 g in sodium chloride 0.9 % 100 mL IVPB  Status:  Discontinued        10/28/21 2004 10/28/21 2013   10/28/21 2015  meropenem (MERREM) 1 g in sodium chloride 0.9 % 100 mL IVPB  Status:  Discontinued  10/28/21 2013 10/29/21 1401         Discharge Exam: Vitals:   10/31/21 1312 10/31/21 2100 11/01/21 0350 11/01/21 0500  BP: 135/70 135/69 138/69   Pulse: 77 77 87   Resp: 17 18 19    Temp: 98.6 F (37 C) 99.3 F (37.4 C) 98.4 F (36.9 C)   TempSrc: Oral Oral Oral   SpO2: 100% 97% 96%   Weight:    71.5 kg  Height:        General: A/O x4, No acute respiratory distress Eyes: negative scleral hemorrhage, negative anisocoria, negative icterus ENT: Negative Runny nose, negative gingival bleeding, Neck:  Negative scars, masses, torticollis, lymphadenopathy, JVD Lungs: clear to auscultation bilaterally without wheezes or crackles Cardiovascular: Regular rhythm and rate without murmur gallop or rub normal S1 and S2  Discharge Instructions   Allergies as of 11/01/2021       Reactions   Bee Venom Shortness Of Breath   Penicillins Anaphylaxis   Remote reaction Throat swelling - compromised airway Tolerates ceftriaxone   Pneumococcal Vaccines    Per patient due to Brain Aneurysm   Sulfa Antibiotics Hives, Itching,  Swelling   Bactrim [sulfamethoxazole-trimethoprim]    Felt off balance        Medication List     TAKE these medications    Accu-Chek Aviva Plus w/Device Kit The patient IS insulin requiring, ICD 10 code E11.9. The patient tests 4 times per day.   accu-chek soft touch lancets Use as instructed   acetaminophen 500 MG tablet Commonly known as: TYLENOL Take 1,000 mg by mouth every 6 (six) hours as needed for mild pain.   alendronate 70 MG tablet Commonly known as: FOSAMAX Take 70 mg by mouth once a week. Monday   atorvastatin 40 MG tablet Commonly known as: LIPITOR Take 1 tablet (40 mg total) by mouth daily.   ciprofloxacin 500 MG tablet Commonly known as: CIPRO Take 1 tablet (500 mg total) by mouth 2 (two) times daily.   DULoxetine 60 MG capsule Commonly known as: CYMBALTA Take 60 mg by mouth daily.   gabapentin 300 MG capsule Commonly known as: NEURONTIN Take 300 mg in AM, 300 mg at noon, 600 mg at bedtime What changed:  how much to take how to take this when to take this additional instructions   glucose blood test strip Commonly known as: Accu-Chek Aviva Plus Use as instructed. The patient IS insulin requiring, ICD 10 code E11.9. The patient tests 4 times per day.   insulin glargine 100 UNIT/ML Solostar Pen Commonly known as: Lantus SoloStar Inject 25 Units into the skin at bedtime.   Insulin Pen Needle 32G X 4 MM Misc Use to inject lantus once dialy   Invokamet XR 50-1000 MG Tb24 Generic drug: Canagliflozin-metFORMIN HCl ER TAKE 2 TABLETS BY MOUTH DAILY WITH BREAKFAST   oxyCODONE-acetaminophen 7.5-325 MG tablet Commonly known as: PERCOCET Take 1 tablet by mouth every 6 (six) hours as needed for severe pain.   propranolol ER 120 MG 24 hr capsule Commonly known as: INDERAL LA Take 40 mg by mouth in the morning, at noon, and at bedtime. What changed: Another medication with the same name was removed. Continue taking this medication, and follow the  directions you see here.   traZODone 50 MG tablet Commonly known as: DESYREL Take 1 tablet (50 mg total) by mouth at bedtime as needed for sleep.   vitamin B-12 1000 MCG tablet Commonly known as: CYANOCOBALAMIN Take 1,000 mcg by mouth daily.  VITAMIN C PO Take 500 mg by mouth daily.       Allergies  Allergen Reactions   Bee Venom Shortness Of Breath   Penicillins Anaphylaxis    Remote reaction Throat swelling - compromised airway Tolerates ceftriaxone   Pneumococcal Vaccines     Per patient due to Brain Aneurysm   Sulfa Antibiotics Hives, Itching and Swelling   Bactrim [Sulfamethoxazole-Trimethoprim]     Felt off balance      The results of significant diagnostics from this hospitalization (including imaging, microbiology, ancillary and laboratory) are listed below for reference.    Significant Diagnostic Studies: DG C-Arm 1-60 Min-No Report  Result Date: 10/29/2021 Fluoroscopy was utilized by the requesting physician.  No radiographic interpretation.   CT CHEST ABDOMEN PELVIS WO CONTRAST  Result Date: 10/28/2021 CLINICAL DATA:  Shortness of breath with vomiting and diarrhea. EXAM: CT CHEST, ABDOMEN AND PELVIS WITHOUT CONTRAST TECHNIQUE: Multidetector CT imaging of the chest, abdomen and pelvis was performed following the standard protocol without IV contrast. RADIATION DOSE REDUCTION: This exam was performed according to the departmental dose-optimization program which includes automated exposure control, adjustment of the mA and/or kV according to patient size and/or use of iterative reconstruction technique. COMPARISON:  None Available. FINDINGS: CT CHEST FINDINGS Cardiovascular: There is mild calcification of the aortic arch, without evidence of aortic aneurysm. Normal heart size with marked severity coronary artery calcification. There is a trace amount of pericardial fluid. Mediastinum/Nodes: No enlarged mediastinal, hilar, or axillary lymph nodes. Thyroid gland,  trachea, and esophagus demonstrate no significant findings. Lungs/Pleura: Lungs are clear. No pleural effusion or pneumothorax. Musculoskeletal: No chest wall mass or suspicious bone lesions identified. CT ABDOMEN PELVIS FINDINGS Hepatobiliary: No focal liver abnormality is seen. Status post cholecystectomy. No biliary dilatation. Pancreas: Unremarkable. No pancreatic ductal dilatation or surrounding inflammatory changes. Spleen: Normal in size without focal abnormality. Adrenals/Urinary Tract: Adrenal glands are unremarkable. Kidneys are normal in size. Multiple bilateral renal cysts are seen. No additional follow-up or imaging is recommended. Numerous bilateral subcentimeter non-obstructing renal calculi are also noted. Adjacent 2 mm and 3 mm obstructing renal calculi are seen within the distal right ureter (axial CT image 107, CT series 2). There is mild right-sided hydronephrosis, hydroureter and periureteral inflammatory fat stranding. Bladder is unremarkable. Stomach/Bowel: Stomach is within normal limits. The appendix is surgically absent no evidence of bowel wall thickening, distention, or inflammatory changes. Vascular/Lymphatic: Aortic atherosclerosis. No enlarged abdominal or pelvic lymph nodes. Reproductive: Status post hysterectomy. No adnexal masses. Other: No abdominal wall hernia or abnormality. No abdominopelvic ascites. Musculoskeletal: No acute or significant osseous findings. IMPRESSION: 1. Adjacent 2 mm and 3 mm obstructing renal calculi within the distal right ureter. 2. Numerous bilateral subcentimeter non-obstructing renal calculi. 3. Marked severity coronary artery calcification. 4. Trace amount of pericardial fluid. 5. Aortic atherosclerosis. Aortic Atherosclerosis (ICD10-I70.0). Electronically Signed   By: Virgina Norfolk M.D.   On: 10/28/2021 19:53   CT Head Wo Contrast  Result Date: 10/28/2021 CLINICAL DATA:  Shortness of breath with vomiting and diarrhea. EXAM: CT HEAD WITHOUT  CONTRAST TECHNIQUE: Contiguous axial images were obtained from the base of the skull through the vertex without intravenous contrast. RADIATION DOSE REDUCTION: This exam was performed according to the departmental dose-optimization program which includes automated exposure control, adjustment of the mA and/or kV according to patient size and/or use of iterative reconstruction technique. COMPARISON:  Oct 08, 2021 FINDINGS: Brain: No evidence of acute infarction, hemorrhage, hydrocephalus, extra-axial collection or mass lesion/mass effect. Vascular: A metallic  density right MCA bifurcation aneurysm clip is seen within the anterolateral aspect of the anterolateral temporoparietal region. Skull: A right frontotemporal craniotomy defect is seen. Sinuses/Orbits: No acute finding. Other: None. IMPRESSION: 1. No acute intracranial abnormality. 2. Right frontotemporal craniotomy defect with an adjacent metallic density right MCA bifurcation aneurysm clip. Electronically Signed   By: Virgina Norfolk M.D.   On: 10/28/2021 19:49   DG Chest 2 View  Result Date: 10/28/2021 CLINICAL DATA:  Shortness of breath.  Tightness in chest. EXAM: CHEST - 2 VIEW COMPARISON:  PA chest and right rib radiographs 04/12/2018; chest two views 03/07/2015 FINDINGS: Cardiac silhouette and mediastinal contours are within normal limits with calcification again seen within the aortic arch. There is flattening of the diaphragms and moderate hyperinflation. Increased lucencies within the upper lungs are again seen suggesting chronic cystic emphysematous changes. No focal airspace opacity to indicate pneumonia. No pleural effusion or pneumothorax. Mild-to-moderate multilevel degenerative disc changes of the thoracic spine. Cholecystectomy clips. IMPRESSION: 1. Chronic hyperinflation and findings suggesting emphysematous change. 2. No acute lung process. Electronically Signed   By: Yvonne Kendall M.D.   On: 10/28/2021 17:11   CT ANGIO HEAD W OR WO  CONTRAST  Result Date: 10/09/2021 CLINICAL DATA:  Treated aneurysm follow-up EXAM: CT ANGIOGRAPHY HEAD TECHNIQUE: Multidetector CT imaging of the head was performed using the standard protocol during bolus administration of intravenous contrast. Multiplanar CT image reconstructions and MIPs were obtained to evaluate the vascular anatomy. RADIATION DOSE REDUCTION: This exam was performed according to the departmental dose-optimization program which includes automated exposure control, adjustment of the mA and/or kV according to patient size and/or use of iterative reconstruction technique. CONTRAST:  39m ISOVUE-370 IOPAMIDOL (ISOVUE-370) INJECTION 76% COMPARISON:  Brain MRI 12/31/2019, CTA head 09/07/2016 FINDINGS: CT HEAD Brain: There is no evidence of acute intracranial hemorrhage, extra-axial fluid collection, or acute infarct. Parenchymal volume is normal. The ventricles are normal in size. Gray-white differentiation is preserved. There is no mass lesion.  There is no mass effect or midline shift. Vascular: An aneurysm clip is again seen in the region of the right MCA bifurcation, similar in position to the prior study. See below for full evaluation of the intracranial vasculature. Skull: Postsurgical changes reflecting right craniotomy are again seen. There is no acute osseous abnormality or suspicious osseous lesion. Sinuses: The paranasal sinuses are clear. Other: Bilateral lens implants are in place. The globes and orbits are otherwise unremarkable. CTA HEAD Anterior circulation: The intracranial ICAs are patent. An aneurysm clip is again seen at the right MCA bifurcation. Streak artifact degrades evaluation of the surrounding vasculature. Within this confine, there is no definite residual or recurrent aneurysm. The bilateral MCAs are patent The bilateral ACAs are patent. The anterior communicating artery is normal. There is no new aneurysm Posterior circulation: The bilateral V4 segments are patent. PICA  is identified bilaterally. The basilar artery is patent. The bilateral PCAs are patent primarily supplied by prominent posterior communicating arteries bilaterally. There is no aneurysm. Venous sinuses: Patent. Anatomic variants: As above. Review of the MIP images confirms the above findings. IMPRESSION: Postprocedural changes reflecting right MCA bifurcation aneurysm clipping with no definite residual or recurrent aneurysm. Electronically Signed   By: PValetta MoleM.D.   On: 10/09/2021 09:50    Microbiology: Recent Results (from the past 240 hour(s))  SARS Coronavirus 2 by RT PCR ([facility] *cepheid single result test* Anterior Nasal Swab     Status: None  Collection Time: 10/28/21  6:55 PM   Specimen: Anterior Nasal Swab  Result Value Ref Range Status   SARS Coronavirus 2 by RT PCR NEGATIVE NEGATIVE Final    Comment: (NOTE) SARS-CoV-2 target nucleic acids are NOT DETECTED.  The SARS-CoV-2 RNA is generally detectable in upper and lower respiratory specimens during the acute phase of infection. The lowest concentration of SARS-CoV-2 viral copies this assay can detect is 250 copies / mL. A negative result does not preclude SARS-CoV-2 infection and should not be used as the sole basis for treatment or other patient management decisions.  A negative result may occur with improper specimen collection / handling, submission of specimen other than nasopharyngeal swab, presence of viral mutation(s) within the areas targeted by this assay, and inadequate number of viral copies (<250 copies / mL). A negative result must be combined with clinical observations, patient history, and epidemiological information.  Fact Sheet for Patients:   https://www.patel.info/  Fact Sheet for Healthcare Providers: https://hall.com/  This test is not yet approved or  cleared by the [place] FDA and has been authorized for  detection and/or diagnosis of SARS-CoV-2 by FDA under an Emergency Use Authorization (EUA).  This EUA will remain in effect (meaning this test can be used) for the duration of the COVID-19 declaration under Section 564(b)(1) of the Act, 21 U.S.C. section 360bbb-3(b)(1), unless the authorization is terminated or revoked sooner.  Performed at [facility], [address], [place], [place] [address]   Urine Culture     Status: Abnormal   Collection Time: 10/28/21  8:45 PM   Specimen: Urine, Clean Catch  Result Value Ref Range Status   Specimen Description   Final    URINE, CLEAN CATCH Performed at [facility], [address] [place], [place] [address]    Special Requests   Final    NONE Performed at [facility], [address] [place], [place] [address]    Culture MULTIPLE SPECIES PRESENT, SUGGEST RECOLLECTION (A)  Final   Report Status 10/29/2021 FINAL  Final  MRSA Next Gen by PCR, Nasal     Status: None   Collection Time: 10/29/21  2:04 AM   Specimen: Nasal Mucosa; Nasal Swab  Result Value Ref Range Status   MRSA by PCR Next Gen NOT DETECTED NOT DETECTED Final    Comment: (NOTE) The GeneXpert MRSA Assay (FDA approved for NASAL specimens only), is one component of a comprehensive MRSA colonization surveillance program. It is not intended to diagnose MRSA infection nor to guide or monitor treatment for MRSA infections. Test performance is not FDA approved in patients less than 20 years old. Performed at [facility], [place] [address], [place], [place] [address]      Labs: Basic Metabolic Panel: Recent Labs  Lab 10/29/21 0254 10/29/21 1008 10/30/21 0259 10/31/21 0436 11/01/21 0519  NA 139 138 143 139 141  K 3.2* 3.5 3.4* 3.8 3.2*  CL 106 107 110 105 107  CO2 23 22 26 26 26   GLUCOSE 153* 159* 71 186* 163*  BUN 23 17 11 10 9   CREATININE 0.92 0.75 0.66 0.59 0.70  CALCIUM 8.8* 8.5* 8.6* 8.4* 8.6*  MG 1.7  --  1.7  1.4* 1.6*  PHOS  --   --  3.7 3.9 3.8   Liver Function Tests: Recent Labs  Lab 10/28/21 1652 10/29/21 0254 10/30/21 0259 10/31/21 0436 11/01/21 0519  AST 24 18 11* 13* 14*  ALT 15 10 10 9 12   ALKPHOS 102 75 66 60 68  BILITOT 2.0* 1.6* 0.8 0.7 0.8  PROT 8.6* 6.7 6.0* 5.8* 6.2*  ALBUMIN 4.0 3.3* 2.8* 2.7* 3.0*   Recent Labs  Lab 10/28/21 1847  LIPASE 24   No results for input(s): "AMMONIA" in the last 168 hours. CBC: Recent Labs  Lab 10/28/21 1652 10/29/21 0254 10/30/21 0259 10/31/21 0436 11/01/21 0519  WBC 14.8* 11.9* 8.4 7.8 9.7  NEUTROABS 10.5* 6.8 4.3 3.3 7.8*  HGB 14.8 12.6 11.7* 10.9* 10.9*  HCT 45.0 38.5 37.2 34.5* 35.3*  MCV 75.3* 77.6* 78.8* 78.8* 78.6*  PLT 414* 282 296 261 247   Cardiac Enzymes: No results for input(s): "CKTOTAL", "CKMB", "CKMBINDEX", "TROPONINI" in the last 168 hours. BNP: BNP (last 3 results) No results for input(s): "BNP" in the last 8760 hours.  ProBNP (last 3 results) No results for input(s): "PROBNP" in the last 8760 hours.  CBG: Recent Labs  Lab 10/31/21 1926 11/01/21 0006 11/01/21 0339 11/01/21 0737 11/01/21 1133  GLUCAP 162* 221* 164* 160* 131*       Signed:  Dia Crawford, MD [facility]

## 2021-11-01 NOTE — TOC Transition Note (Signed)
Transition of Care [facility]) - CM/SW Discharge Note   Patient Details  Name: Yuliza Cara MRN: [id number] Date of Birth: 1955/08/01  Transition of Care [facility]) CM/SW Contact:  Leeroy Cha, RN Phone Number: 11/01/2021, 3:25 PM   Clinical Narrative:    Patient dcd to home with self care. Chart reviewed for toc needs none found.   Final next level of care: Home/Self Care Barriers to Discharge: No Barriers Identified   Patient Goals and CMS Choice Patient states their goals for this hospitalization and ongoing recovery are:: to go home CMS Medicare.gov Compare Post Acute Care list provided to:: Patient Choice offered to / list presented to : Patient  Discharge Placement                       Discharge Plan and Services   Discharge Planning Services: CM Consult                                 Social Determinants of Health (SDOH) Interventions     Readmission Risk Interventions     No data to display

## 2021-11-01 NOTE — Plan of Care (Signed)
Problem: Education: Goal: Ability to describe self-care measures that may prevent or decrease complications (Diabetes Survival Skills Education) will improve Outcome: Adequate for Discharge Goal: Individualized Educational Video(s) Outcome: Adequate for Discharge   Problem: Cardiac: Goal: Ability to maintain an adequate cardiac output will improve Outcome: Adequate for Discharge   Problem: Health Behavior/Discharge Planning: Goal: Ability to identify and utilize available resources and services will improve Outcome: Adequate for Discharge Goal: Ability to manage health-related needs will improve Outcome: Adequate for Discharge   Problem: Fluid Volume: Goal: Ability to achieve a balanced intake and output will improve Outcome: Adequate for Discharge   Problem: Metabolic: Goal: Ability to maintain appropriate glucose levels will improve Outcome: Adequate for Discharge   Problem: Nutritional: Goal: Maintenance of adequate nutrition will improve Outcome: Adequate for Discharge Goal: Maintenance of adequate weight for body size and type will improve Outcome: Adequate for Discharge   Problem: Respiratory: Goal: Will regain and/or maintain adequate ventilation Outcome: Adequate for Discharge   Problem: Urinary Elimination: Goal: Ability to achieve and maintain adequate renal perfusion and functioning will improve Outcome: Adequate for Discharge   Problem: Education: Goal: Ability to describe self-care measures that may prevent or decrease complications (Diabetes Survival Skills Education) will improve Outcome: Adequate for Discharge Goal: Individualized Educational Video(s) Outcome: Adequate for Discharge   Problem: Coping: Goal: Ability to adjust to condition or change in health will improve Outcome: Adequate for Discharge   Problem: Fluid Volume: Goal: Ability to maintain a balanced intake and output will improve Outcome: Adequate for Discharge   Problem: Health  Behavior/Discharge Planning: Goal: Ability to identify and utilize available resources and services will improve Outcome: Adequate for Discharge Goal: Ability to manage health-related needs will improve Outcome: Adequate for Discharge   Problem: Metabolic: Goal: Ability to maintain appropriate glucose levels will improve Outcome: Adequate for Discharge   Problem: Nutritional: Goal: Maintenance of adequate nutrition will improve Outcome: Adequate for Discharge Goal: Progress toward achieving an optimal weight will improve Outcome: Adequate for Discharge   Problem: Skin Integrity: Goal: Risk for impaired skin integrity will decrease Outcome: Adequate for Discharge   Problem: Tissue Perfusion: Goal: Adequacy of tissue perfusion will improve Outcome: Adequate for Discharge   Problem: Education: Goal: Knowledge of General Education information will improve Description: Including pain rating scale, medication(s)/side effects and non-pharmacologic comfort measures Outcome: Adequate for Discharge   Problem: Health Behavior/Discharge Planning: Goal: Ability to manage health-related needs will improve Outcome: Adequate for Discharge   Problem: Clinical Measurements: Goal: Ability to maintain clinical measurements within normal limits will improve Outcome: Adequate for Discharge Goal: Will remain free from infection Outcome: Adequate for Discharge Goal: Diagnostic test results will improve Outcome: Adequate for Discharge Goal: Respiratory complications will improve Outcome: Adequate for Discharge Goal: Cardiovascular complication will be avoided Outcome: Adequate for Discharge   Problem: Activity: Goal: Risk for activity intolerance will decrease Outcome: Adequate for Discharge   Problem: Nutrition: Goal: Adequate nutrition will be maintained Outcome: Adequate for Discharge   Problem: Coping: Goal: Level of anxiety will decrease Outcome: Adequate for Discharge   Problem:  Elimination: Goal: Will not experience complications related to bowel motility Outcome: Adequate for Discharge Goal: Will not experience complications related to urinary retention Outcome: Adequate for Discharge   Problem: Pain Managment: Goal: General experience of comfort will improve Outcome: Adequate for Discharge   Problem: Safety: Goal: Ability to remain free from injury will improve Outcome: Adequate for Discharge   Problem: Skin Integrity: Goal: Risk for impaired skin integrity will decrease  Outcome: Adequate for Discharge   

## 2021-11-02 ENCOUNTER — Inpatient Hospital Stay (HOSPITAL_COMMUNITY)
Admission: EM | Admit: 2021-11-02 | Discharge: 2021-11-07 | DRG: 481 | Disposition: A | Discharge status: Home Health | Payer: Medicare HMO | Source: Ambulatory Visit | Attending: Internal Medicine | Admitting: Internal Medicine

## 2021-11-02 ENCOUNTER — Emergency Department (HOSPITAL_COMMUNITY): Payer: Medicare HMO

## 2021-11-02 ENCOUNTER — Ambulatory Visit (HOSPITAL_BASED_OUTPATIENT_CLINIC_OR_DEPARTMENT_OTHER): Payer: Self-pay | Admitting: Orthopaedic Surgery

## 2021-11-02 ENCOUNTER — Encounter (HOSPITAL_COMMUNITY): Payer: Self-pay | Admitting: Emergency Medicine

## 2021-11-02 ENCOUNTER — Other Ambulatory Visit: Payer: Self-pay

## 2021-11-02 DIAGNOSIS — Y92239 Unspecified place in hospital as the place of occurrence of the external cause: Secondary | ICD-10-CM | POA: Diagnosis present

## 2021-11-02 DIAGNOSIS — S7292XA Unspecified fracture of left femur, initial encounter for closed fracture: Secondary | ICD-10-CM | POA: Diagnosis not present

## 2021-11-02 DIAGNOSIS — W1830XA Fall on same level, unspecified, initial encounter: Secondary | ICD-10-CM | POA: Diagnosis present

## 2021-11-02 DIAGNOSIS — Z85828 Personal history of other malignant neoplasm of skin: Secondary | ICD-10-CM | POA: Diagnosis not present

## 2021-11-02 DIAGNOSIS — Z794 Long term (current) use of insulin: Secondary | ICD-10-CM

## 2021-11-02 DIAGNOSIS — M25522 Pain in left elbow: Secondary | ICD-10-CM | POA: Diagnosis not present

## 2021-11-02 DIAGNOSIS — R319 Hematuria, unspecified: Secondary | ICD-10-CM | POA: Diagnosis not present

## 2021-11-02 DIAGNOSIS — Z8249 Family history of ischemic heart disease and other diseases of the circulatory system: Secondary | ICD-10-CM

## 2021-11-02 DIAGNOSIS — M810 Age-related osteoporosis without current pathological fracture: Secondary | ICD-10-CM | POA: Diagnosis present

## 2021-11-02 DIAGNOSIS — A419 Sepsis, unspecified organism: Secondary | ICD-10-CM | POA: Diagnosis not present

## 2021-11-02 DIAGNOSIS — Z880 Allergy status to penicillin: Secondary | ICD-10-CM | POA: Diagnosis not present

## 2021-11-02 DIAGNOSIS — E039 Hypothyroidism, unspecified: Secondary | ICD-10-CM | POA: Diagnosis not present

## 2021-11-02 DIAGNOSIS — W19XXXA Unspecified fall, initial encounter: Secondary | ICD-10-CM | POA: Diagnosis not present

## 2021-11-02 DIAGNOSIS — Z888 Allergy status to other drugs, medicaments and biological substances status: Secondary | ICD-10-CM

## 2021-11-02 DIAGNOSIS — D62 Acute posthemorrhagic anemia: Secondary | ICD-10-CM | POA: Diagnosis not present

## 2021-11-02 DIAGNOSIS — M25552 Pain in left hip: Secondary | ICD-10-CM | POA: Diagnosis not present

## 2021-11-02 DIAGNOSIS — S72012A Unspecified intracapsular fracture of left femur, initial encounter for closed fracture: Secondary | ICD-10-CM | POA: Diagnosis not present

## 2021-11-02 DIAGNOSIS — Z79899 Other long term (current) drug therapy: Secondary | ICD-10-CM | POA: Diagnosis not present

## 2021-11-02 DIAGNOSIS — S62633D Displaced fracture of distal phalanx of left middle finger, subsequent encounter for fracture with routine healing: Secondary | ICD-10-CM | POA: Diagnosis not present

## 2021-11-02 DIAGNOSIS — E1140 Type 2 diabetes mellitus with diabetic neuropathy, unspecified: Secondary | ICD-10-CM | POA: Diagnosis not present

## 2021-11-02 DIAGNOSIS — E1142 Type 2 diabetes mellitus with diabetic polyneuropathy: Secondary | ICD-10-CM

## 2021-11-02 DIAGNOSIS — Z833 Family history of diabetes mellitus: Secondary | ICD-10-CM

## 2021-11-02 DIAGNOSIS — R531 Weakness: Secondary | ICD-10-CM | POA: Diagnosis not present

## 2021-11-02 DIAGNOSIS — Z7983 Long term (current) use of bisphosphonates: Secondary | ICD-10-CM

## 2021-11-02 DIAGNOSIS — Z91030 Bee allergy status: Secondary | ICD-10-CM

## 2021-11-02 DIAGNOSIS — S42402P Unspecified fracture of lower end of left humerus, subsequent encounter for fracture with malunion: Secondary | ICD-10-CM | POA: Diagnosis not present

## 2021-11-02 DIAGNOSIS — S72002A Fracture of unspecified part of neck of left femur, initial encounter for closed fracture: Principal | ICD-10-CM

## 2021-11-02 DIAGNOSIS — D649 Anemia, unspecified: Secondary | ICD-10-CM | POA: Diagnosis present

## 2021-11-02 DIAGNOSIS — N1330 Unspecified hydronephrosis: Secondary | ICD-10-CM | POA: Diagnosis not present

## 2021-11-02 DIAGNOSIS — E7800 Pure hypercholesterolemia, unspecified: Secondary | ICD-10-CM | POA: Diagnosis present

## 2021-11-02 DIAGNOSIS — N135 Crossing vessel and stricture of ureter without hydronephrosis: Secondary | ICD-10-CM | POA: Diagnosis present

## 2021-11-02 DIAGNOSIS — N136 Pyonephrosis: Secondary | ICD-10-CM | POA: Diagnosis not present

## 2021-11-02 DIAGNOSIS — R338 Other retention of urine: Secondary | ICD-10-CM | POA: Diagnosis not present

## 2021-11-02 DIAGNOSIS — Z01818 Encounter for other preprocedural examination: Secondary | ICD-10-CM | POA: Diagnosis not present

## 2021-11-02 DIAGNOSIS — S72009A Fracture of unspecified part of neck of unspecified femur, initial encounter for closed fracture: Secondary | ICD-10-CM | POA: Insufficient documentation

## 2021-11-02 DIAGNOSIS — I10 Essential (primary) hypertension: Secondary | ICD-10-CM | POA: Diagnosis present

## 2021-11-02 DIAGNOSIS — N200 Calculus of kidney: Secondary | ICD-10-CM | POA: Diagnosis not present

## 2021-11-02 DIAGNOSIS — Z8781 Personal history of (healed) traumatic fracture: Secondary | ICD-10-CM | POA: Diagnosis not present

## 2021-11-02 DIAGNOSIS — Z79890 Hormone replacement therapy (postmenopausal): Secondary | ICD-10-CM

## 2021-11-02 DIAGNOSIS — R6520 Severe sepsis without septic shock: Secondary | ICD-10-CM | POA: Diagnosis not present

## 2021-11-02 DIAGNOSIS — Z882 Allergy status to sulfonamides status: Secondary | ICD-10-CM

## 2021-11-02 DIAGNOSIS — E876 Hypokalemia: Secondary | ICD-10-CM | POA: Diagnosis present

## 2021-11-02 DIAGNOSIS — M7989 Other specified soft tissue disorders: Secondary | ICD-10-CM | POA: Diagnosis not present

## 2021-11-02 DIAGNOSIS — N390 Urinary tract infection, site not specified: Secondary | ICD-10-CM | POA: Diagnosis not present

## 2021-11-02 DIAGNOSIS — S42402A Unspecified fracture of lower end of left humerus, initial encounter for closed fracture: Secondary | ICD-10-CM | POA: Diagnosis present

## 2021-11-02 DIAGNOSIS — Z87891 Personal history of nicotine dependence: Secondary | ICD-10-CM | POA: Diagnosis not present

## 2021-11-02 DIAGNOSIS — R9431 Abnormal electrocardiogram [ECG] [EKG]: Secondary | ICD-10-CM | POA: Diagnosis not present

## 2021-11-02 DIAGNOSIS — S52022A Displaced fracture of olecranon process without intraarticular extension of left ulna, initial encounter for closed fracture: Secondary | ICD-10-CM | POA: Diagnosis not present

## 2021-11-02 DIAGNOSIS — T8383XA Hemorrhage of genitourinary prosthetic devices, implants and grafts, initial encounter: Secondary | ICD-10-CM | POA: Diagnosis not present

## 2021-11-02 DIAGNOSIS — E875 Hyperkalemia: Secondary | ICD-10-CM | POA: Diagnosis present

## 2021-11-02 LAB — APTT: aPTT: 28 seconds (ref 24–36)

## 2021-11-02 LAB — I-STAT CHEM 8, ED
BUN: 10 mg/dL (ref 8–23)
Calcium, Ion: 1.09 mmol/L — ABNORMAL LOW (ref 1.15–1.40)
Chloride: 100 mmol/L (ref 98–111)
Creatinine, Ser: 0.7 mg/dL (ref 0.44–1.00)
Glucose, Bld: 221 mg/dL — ABNORMAL HIGH (ref 70–99)
HCT: 35 % — ABNORMAL LOW (ref 36.0–46.0)
Hemoglobin: 11.9 g/dL — ABNORMAL LOW (ref 12.0–15.0)
Potassium: 4.9 mmol/L (ref 3.5–5.1)
Sodium: 136 mmol/L (ref 135–145)
TCO2: 28 mmol/L (ref 22–32)

## 2021-11-02 LAB — TYPE AND SCREEN
ABO/RH(D): O POS
Antibody Screen: NEGATIVE

## 2021-11-02 LAB — CBC
HCT: 41.4 % (ref 36.0–46.0)
Hemoglobin: 13 g/dL (ref 12.0–15.0)
MCH: 24.5 pg — ABNORMAL LOW (ref 26.0–34.0)
MCHC: 31.4 g/dL (ref 30.0–36.0)
MCV: 78 fL — ABNORMAL LOW (ref 80.0–100.0)
Platelets: 316 10*3/uL (ref 150–400)
RBC: 5.31 MIL/uL — ABNORMAL HIGH (ref 3.87–5.11)
RDW: 14.9 % (ref 11.5–15.5)
WBC: 9.3 10*3/uL (ref 4.0–10.5)
nRBC: 0 % (ref 0.0–0.2)

## 2021-11-02 LAB — DIFFERENTIAL
Abs Immature Granulocytes: 0.04 10*3/uL (ref 0.00–0.07)
Basophils Absolute: 0.1 10*3/uL (ref 0.0–0.1)
Basophils Relative: 1 %
Eosinophils Absolute: 0.1 10*3/uL (ref 0.0–0.5)
Eosinophils Relative: 2 %
Immature Granulocytes: 0 %
Lymphocytes Relative: 17 %
Lymphs Abs: 1.6 10*3/uL (ref 0.7–4.0)
Monocytes Absolute: 0.7 10*3/uL (ref 0.1–1.0)
Monocytes Relative: 7 %
Neutro Abs: 6.9 10*3/uL (ref 1.7–7.7)
Neutrophils Relative %: 73 %

## 2021-11-02 LAB — COMPREHENSIVE METABOLIC PANEL
ALT: 8 U/L (ref 0–44)
AST: 42 U/L — ABNORMAL HIGH (ref 15–41)
Albumin: 3 g/dL — ABNORMAL LOW (ref 3.5–5.0)
Alkaline Phosphatase: 72 U/L (ref 38–126)
Anion gap: 9 (ref 5–15)
BUN: 11 mg/dL (ref 8–23)
CO2: 26 mmol/L (ref 22–32)
Calcium: 8.6 mg/dL — ABNORMAL LOW (ref 8.9–10.3)
Chloride: 102 mmol/L (ref 98–111)
Creatinine, Ser: 0.9 mg/dL (ref 0.44–1.00)
GFR, Estimated: >60 mL/min (ref >60)
Glucose, Bld: 235 mg/dL — ABNORMAL HIGH (ref 70–99)
Potassium: 6.4 mmol/L (ref 3.5–5.1)
Sodium: 137 mmol/L (ref 135–145)
Total Bilirubin: 2.2 mg/dL — ABNORMAL HIGH (ref 0.3–1.2)
Total Protein: 6.8 g/dL (ref 6.5–8.1)

## 2021-11-02 LAB — CBG MONITORING, ED
Glucose-Capillary: 252 mg/dL — ABNORMAL HIGH (ref 70–99)
Glucose-Capillary: 267 mg/dL — ABNORMAL HIGH (ref 70–99)

## 2021-11-02 LAB — PROTIME-INR
INR: 1 (ref 0.8–1.2)
Prothrombin Time: 13.4 seconds (ref 11.4–15.2)

## 2021-11-02 LAB — ABO/RH: ABO/RH(D): O POS

## 2021-11-02 MED ORDER — FENTANYL CITRATE PF 50 MCG/ML IJ SOSY
50.0000 ug | PREFILLED_SYRINGE | INTRAMUSCULAR | Status: AC | PRN
  Administered 2021-11-02 – 2021-11-03 (×2): 50 ug via INTRAVENOUS
  Filled 2021-11-02 (×2): qty 1

## 2021-11-02 MED ORDER — INSULIN ASPART 100 UNIT/ML IJ SOLN
0.0000 [IU] | INTRAMUSCULAR | Status: DC
  Administered 2021-11-03 (×4): 3 [IU] via SUBCUTANEOUS
  Administered 2021-11-03: 8 [IU] via SUBCUTANEOUS
  Administered 2021-11-04: 3 [IU] via SUBCUTANEOUS
  Administered 2021-11-04: 8 [IU] via SUBCUTANEOUS
  Administered 2021-11-05 (×2): 5 [IU] via SUBCUTANEOUS
  Administered 2021-11-05: 11 [IU] via SUBCUTANEOUS
  Administered 2021-11-05: 3 [IU] via SUBCUTANEOUS
  Administered 2021-11-05: 5 [IU] via SUBCUTANEOUS
  Filled 2021-11-02: qty 0.15

## 2021-11-02 MED ORDER — ATORVASTATIN CALCIUM 40 MG PO TABS
40.0000 mg | ORAL_TABLET | Freq: Every day | ORAL | Status: DC
  Administered 2021-11-02 – 2021-11-07 (×6): 40 mg via ORAL
  Filled 2021-11-02 (×6): qty 1

## 2021-11-02 MED ORDER — TRAZODONE HCL 50 MG PO TABS
50.0000 mg | ORAL_TABLET | Freq: Every evening | ORAL | Status: DC | PRN
  Administered 2021-11-04 – 2021-11-06 (×3): 50 mg via ORAL
  Filled 2021-11-02 (×3): qty 1

## 2021-11-02 MED ORDER — SODIUM CHLORIDE 0.9% FLUSH
3.0000 mL | Freq: Once | INTRAVENOUS | Status: AC
  Administered 2021-11-02: 3 mL via INTRAVENOUS

## 2021-11-02 MED ORDER — INSULIN DETEMIR 100 UNIT/ML ~~LOC~~ SOLN
15.0000 [IU] | Freq: Every day | SUBCUTANEOUS | Status: DC
Start: 2021-11-02 — End: 2021-11-05
  Administered 2021-11-02 – 2021-11-04 (×3): 15 [IU] via SUBCUTANEOUS
  Filled 2021-11-02 (×7): qty 0.15

## 2021-11-02 MED ORDER — CIPROFLOXACIN HCL 500 MG PO TABS
500.0000 mg | ORAL_TABLET | Freq: Two times a day (BID) | ORAL | Status: DC
  Administered 2021-11-02 – 2021-11-07 (×10): 500 mg via ORAL
  Filled 2021-11-02 (×11): qty 1

## 2021-11-02 MED ORDER — GABAPENTIN 300 MG PO CAPS
900.0000 mg | ORAL_CAPSULE | Freq: Every day | ORAL | Status: DC
  Administered 2021-11-02 – 2021-11-06 (×5): 900 mg via ORAL
  Filled 2021-11-02 (×5): qty 3

## 2021-11-02 MED ORDER — OXYCODONE-ACETAMINOPHEN 7.5-325 MG PO TABS
1.0000 | ORAL_TABLET | ORAL | Status: DC | PRN
  Administered 2021-11-03 – 2021-11-07 (×10): 1 via ORAL
  Filled 2021-11-02 (×10): qty 1

## 2021-11-02 MED ORDER — DULOXETINE HCL 60 MG PO CPEP
60.0000 mg | ORAL_CAPSULE | Freq: Every day | ORAL | Status: DC
  Administered 2021-11-02 – 2021-11-07 (×6): 60 mg via ORAL
  Filled 2021-11-02 (×2): qty 1
  Filled 2021-11-02 (×2): qty 2
  Filled 2021-11-02 (×2): qty 1

## 2021-11-02 NOTE — Progress Notes (Signed)
Orthopedic Tech Progress Note Patient Details:  Laura Gentry July 22, 1955 [id number]  Ortho Devices Type of Ortho Device: Shoulder immobilizer Ortho Device/Splint Location: left Ortho Device/Splint Interventions: Application   Post Interventions Patient Tolerated: Well Instructions Provided: Care of device, Adjustment of device  [place] Pink 11/02/2021, 5:59 PM

## 2021-11-02 NOTE — H&P (Signed)
[facility] History and Physical  [name] CHY:[id number] DOB: 09/21/1955 DOA: 11/02/2021  Referring physician:  PCP: Seward Carol, MD   Chief Complaint:   HPI: 66 y.o. WF PMHx nephrolithiasis, DM type 2 insulin-dependent, HLD, thyroid disease, migraines, brain aneurysm, lumbosacral radiculopathy   Admitted to [facility] on 10/28/2021 by way of transfer from [place] emergency department with obstructing right lithiasis after presenting from home to the latter complaining of nausea/vomiting.  Discharged on 6/10 s/pcystoscopy with bilateral stent placement on 6/7  Admitted to [facility] fall 2 days ago in the hospital when her feet slipped out from under her she landed on her right side.  She said she was walking up until that moment and then was having difficulty walking on discharge.  She remembers looking at the instructions for the antibiotics they sent her home with the side effects calling joint pain.  She has been unable to ambulate complaining of severe pain on her left hip and in her left elbow.  She has had not had trauma since discharge of the hospital.  Today the pain was so severe that family called the ambulance to transport her back here.  Complaining of pain in her left hip and left elbow with any type of movement.  No numbness or weakness.  No recent fevers or abdominal pain.     Review of Systems:  Covid vaccination;  Constitutional:  No weight loss, night sweats, Fevers, chills, fatigue.  HEENT:  No headaches, Difficulty swallowing,Tooth/dental problems,Sore throat,  No sneezing, itching, ear ache, nasal congestion, post nasal drip,  Cardio-vascular:  No chest pain, Orthopnea, PND, swelling in lower extremities, anasarca, dizziness, palpitations  GI:  No heartburn, indigestion, abdominal pain, nausea, vomiting, diarrhea, change in bowel habits, loss of appetite  Resp:  No shortness of breath with exertion or at rest. No  excess mucus, no productive cough, No non-productive cough, No coughing up of blood.No change in color of mucus.No wheezing.No chest wall deformity  Skin:  no rash or lesions.  GU:  no dysuria, change in color of urine, no urgency or frequency. No flank pain.  Musculoskeletal:  Positive LEFT hip pain or swelling. Positive decreased range of motion LEFT hip. No back pain.  Psych:  No change in mood or affect. No depression or anxiety. No memory loss.   Past Medical History:  Diagnosis Date   Brain aneurysm 03/19/2013   Cataracts, bilateral    Cerebral aneurysm    Colon polyps    Diabetes ([place]) 2013   Headache    High cholesterol    History of kidney stones    Insomnia    Lumbosacral radiculopathy    Migraines    Osteoporosis    Skin cancer    squamous   Thyroid disease    Tremor    Past Surgical History:  Procedure Laterality Date   APPENDECTOMY     BRAIN SURGERY  03/19/13   [place] aneyrsum clip can have MRI up to [place] scanned in    CHOLECYSTECTOMY  2013   [place], URETEROSCOPY AND STENT PLACEMENT Bilateral 10/29/2021   Procedure: [place], URETEROSCOPY AND STENT PLACEMENT;  Surgeon: Alexis Frock, MD;  Location: [facility] ORS;  Service: Urology;  Laterality: Bilateral;   INNER EAR SURGERY     Lost Hearing   TONSILLECTOMY AND ADENOIDECTOMY     VAGINAL HYSTERECTOMY     At age 42 due to "growth"   Social History:  reports that she quit smoking about 10 years ago. Her smoking use included cigarettes. She has a 20.00 pack-year smoking history. She has never used smokeless tobacco. She reports that she does not drink alcohol and does not use drugs.  Allergies  Allergen Reactions   Bee Venom Shortness Of Breath   Penicillins Anaphylaxis    Remote reaction Throat swelling - compromised airway Tolerates ceftriaxone   Pneumococcal Vaccines     Per patient due to Brain Aneurysm   Sulfa Antibiotics Hives, Itching and  Swelling   Bactrim [Sulfamethoxazole-Trimethoprim]     Felt off balance    Family History  Problem Relation Age of Onset   Asthma Mother    Allergies Mother    Diabetes Mother    Lung cancer Father        smoked   Throat cancer Father        smoked   Pancreatic cancer Brother    Hypertension Maternal Grandfather    Diabetes Maternal Grandfather    Emphysema Maternal Aunt        never smoked, spouse did   Breast cancer Maternal Aunt    Breast cancer Maternal Aunt    Breast cancer Paternal Aunt      Prior to Admission medications   Medication Sig Start Date End Date Taking? Authorizing Provider  acetaminophen (TYLENOL) 500 MG tablet Take 1,000 mg by mouth every 6 (six) hours as needed for mild pain.   Yes [provider]  Ascorbic Acid (VITAMIN C PO) Take 500 mg by mouth daily.   Yes [provider]  atorvastatin (LIPITOR) 40 MG tablet Take 1 tablet (40 mg total) by mouth daily. 01/31/18  Yes Lorella Nimrod, MD  Blood Glucose Monitoring Suppl (ACCU-CHEK AVIVA PLUS) w/Device KIT The patient IS insulin requiring, ICD 10 code E11.9. The patient tests 4 times per day. 06/04/17  Yes Lenore Cordia, MD  ciprofloxacin (CIPRO) 500 MG tablet Take 1 tablet (500 mg total) by mouth 2 (two) times daily. 11/01/21  Yes Allie Bossier, MD  DULoxetine (CYMBALTA) 60 MG capsule Take 60 mg by mouth daily.   Yes [provider]  gabapentin (NEURONTIN) 300 MG capsule Take 300 mg in AM, 300 mg at noon, 600 mg at bedtime Patient taking differently: Take 900 mg by mouth at bedtime. 09/21/16  Yes Rivet, Carly J, MD  glucose blood (ACCU-CHEK AVIVA PLUS) test strip Use as instructed. The patient IS insulin requiring, ICD 10 code E11.9. The patient tests 4 times per day. 09/23/16  Yes Rivet, Sindy Guadeloupe, MD  Insulin Glargine (LANTUS SOLOSTAR) 100 UNIT/ML Solostar Pen Inject 25 Units into the skin at bedtime. 01/31/18  Yes Lorella Nimrod, MD  Insulin Pen Needle 32G X 4 MM MISC Use to inject lantus  once dialy 10/29/17  Yes Lorella Nimrod, MD  INVOKAMET XR 50-1000 MG TB24 TAKE 2 TABLETS BY MOUTH DAILY WITH BREAKFAST Patient taking differently: Take 2 tablets by mouth daily with breakfast. 03/26/18  Yes Lorella Nimrod, MD  Lancets (ACCU-CHEK SOFT TOUCH) lancets Use as instructed 09/23/16  Yes Rivet, Sindy Guadeloupe, MD  oxyCODONE-acetaminophen (PERCOCET) 7.5-325 MG tablet Take 1 tablet by mouth every 6 (six) hours as needed for severe pain. 11/01/21  Yes Allie Bossier, MD  propranolol ER (INDERAL LA) 120 MG 24 hr capsule Take 40 mg by mouth in the morning, at noon, and at bedtime. 09/17/15  Yes [provider]  traZODone (DESYREL) 50 MG tablet Take 1 tablet (50 mg total) by  mouth at bedtime as needed for sleep. 11/01/21  Yes Allie Bossier, MD  vitamin B-12 (CYANOCOBALAMIN) 1000 MCG tablet Take 1,000 mcg by mouth daily.   Yes [provider]  alendronate (FOSAMAX) 70 MG tablet Take 70 mg by mouth once a week. Monday 09/27/21   [provider]     Consultants:  Dr. Vanetta Mulders orthopedic surgery  Procedures/Significant Events:  6/11 DG LEFT elbow: Suspect a very subtle fracture of the left radial neck 6/11 CT Hip LEFT W0 contrast: Acute left femoral neck fracture with impaction and minimal displacement, which courses through the lateral head neck junction and through the proximal third of the femoral neck medially. Small joint effusion.   I have personally reviewed and interpreted all radiology studies and my findings are as above.   VENTILATOR SETTINGS:    Cultures   Antimicrobials: Anti-infectives (From admission, onward)    Start     Ordered Stop   11/02/21 2000  ciprofloxacin (CIPRO) tablet 500 mg        11/02/21 1919           Devices    LINES / TUBES:      Continuous Infusions:  Physical Exam: Vitals:   11/02/21 1313 11/02/21 1630 11/02/21 1700 11/02/21 1730  BP: 118/78 132/73 129/66 (!) 141/68  Pulse: 79 79 77 79  Resp: 18 17 16  (!) 23   Temp: 99 F (37.2 C)     TempSrc: Oral     SpO2: 96% 93% 92% 94%    Wt Readings from Last 3 Encounters:  11/01/21 71.5 kg  09/08/21 69.7 kg  04/03/20 72.3 kg    General: A/O x4 No acute respiratory distress Eyes: negative scleral hemorrhage, negative anisocoria, negative icterus ENT: Negative Runny nose, negative gingival bleeding, Neck:  Negative scars, masses, torticollis, lymphadenopathy, JVD Lungs: Clear to auscultation bilaterally without wheezes or crackles Cardiovascular: Regular rate and rhythm without murmur gallop or rub normal S1 and S2 Abdomen: negative abdominal pain, nondistended, positive soft, bowel sounds, no rebound, no ascites, no appreciable mass Extremities: significant tenderness around her LEFT hip and increased pain with any type of range of motion, LEFT elbow and shoulder Skin: Negative rashes, lesions, ulcers Psychiatric:  Negative depression, negative anxiety, negative fatigue, negative mania  Central nervous system:  Cranial nerves II through XII intact, tongue/uvula midline, all extremities muscle strength 5/5, sensation intact throughout, negative dysarthria, negative expressive aphasia, negative receptive aphasia.        Labs on Admission:  Basic Metabolic Panel: Recent Labs  Lab 10/29/21 0254 10/29/21 1008 10/30/21 0259 10/31/21 0436 11/01/21 0519 11/02/21 1548 11/02/21 1802  NA 139 138 143 139 141 137 136  K 3.2* 3.5 3.4* 3.8 3.2* 6.4* 4.9  CL 106 107 110 105 107 102 100  CO2 23 22 26 26 26 26   --   GLUCOSE 153* 159* 71 186* 163* 235* 221*  BUN 23 17 11 10 9 11 10   CREATININE 0.92 0.75 0.66 0.59 0.70 0.90 0.70  CALCIUM 8.8* 8.5* 8.6* 8.4* 8.6* 8.6*  --   MG 1.7  --  1.7 1.4* 1.6*  --   --   PHOS  --   --  3.7 3.9 3.8  --   --    Liver Function Tests: Recent Labs  Lab 10/29/21 0254 10/30/21 0259 10/31/21 0436 11/01/21 0519 11/02/21 1548  AST 18 11* 13* 14* 42*  ALT 10 10 9 12 8   ALKPHOS 75 66 60 68 72  BILITOT 1.6* 0.8 0.7  0.8 2.2*  PROT 6.7 6.0* 5.8* 6.2* 6.8  ALBUMIN 3.3* 2.8* 2.7* 3.0* 3.0*   Recent Labs  Lab 10/28/21 1847  LIPASE 24   No results for input(s): "AMMONIA" in the last 168 hours. CBC: Recent Labs  Lab 10/29/21 0254 10/30/21 0259 10/31/21 0436 11/01/21 0519 11/02/21 1504 11/02/21 1802  WBC 11.9* 8.4 7.8 9.7 9.3  --   NEUTROABS 6.8 4.3 3.3 7.8* 6.9  --   HGB 12.6 11.7* 10.9* 10.9* 13.0 11.9*  HCT 38.5 37.2 34.5* 35.3* 41.4 35.0*  MCV 77.6* 78.8* 78.8* 78.6* 78.0*  --   PLT 282 296 261 247 316  --    Cardiac Enzymes: No results for input(s): "CKTOTAL", "CKMB", "CKMBINDEX", "TROPONINI" in the last 168 hours.  BNP (last 3 results) No results for input(s): "BNP" in the last 8760 hours.  ProBNP (last 3 results) No results for input(s): "PROBNP" in the last 8760 hours.  CBG: Recent Labs  Lab 11/01/21 0006 11/01/21 0339 11/01/21 0737 11/01/21 1133 11/02/21 1419  GLUCAP 221* 164* 160* 131* 252*    Radiological Exams on Admission: CT Hip Left Wo Contrast  Result Date: 11/02/2021 CLINICAL DATA:  Hip pain, stress fracture suspected, neg xray EXAM: CT OF THE LEFT HIP WITHOUT CONTRAST TECHNIQUE: Multidetector CT imaging of the left hip was performed according to the standard protocol. Multiplanar CT image reconstructions were also generated. RADIATION DOSE REDUCTION: This exam was performed according to the departmental dose-optimization program which includes automated exposure control, adjustment of the mA and/or kV according to patient size and/or use of iterative reconstruction technique. COMPARISON:  Same day radiograph FINDINGS: Bones/Joint/Cartilage There is an acute left femoral neck fracture with impaction and minimal displacement, which courses through the lateral head neck junction and through the proximal third of the femoral neck medially. Small joint effusion. Ligaments Suboptimally assessed by CT. Muscles and Tendons No acute myotendinous abnormality by CT. Soft tissues  Mild adjacent soft tissue swelling. IMPRESSION: Acute left subcapital/transcervical left femoral neck fracture with impaction and minimal displacement. Electronically Signed   By: Maurine Simmering M.D.   On: 11/02/2021 16:38   DG Chest 1 View  Result Date: 11/02/2021 CLINICAL DATA:  Hip fracture. EXAM: CHEST  1 VIEW COMPARISON:  None Available. FINDINGS: Normal mediastinum and cardiac silhouette. Normal pulmonary vasculature. No evidence of effusion, infiltrate, or pneumothorax. No acute bony abnormality. IMPRESSION: No acute cardiopulmonary process. Electronically Signed   By: Suzy Bouchard M.D.   On: 11/02/2021 16:23   CT HEAD WO CONTRAST  Result Date: 11/02/2021 CLINICAL DATA:  Neuro deficit, acute, stroke suspected. Left sided weakness. EXAM: CT HEAD WITHOUT CONTRAST TECHNIQUE: Contiguous axial images were obtained from the base of the skull through the vertex without intravenous contrast. RADIATION DOSE REDUCTION: This exam was performed according to the departmental dose-optimization program which includes automated exposure control, adjustment of the mA and/or kV according to patient size and/or use of iterative reconstruction technique. COMPARISON:  Head CT 10/28/2021 and MRI 12/31/2019 FINDINGS: Brain: Within limitations of streak artifact from a right MCA aneurysm clip, there is no evidence of an acute infarct, intracranial hemorrhage, mass, midline shift, or extra-axial fluid collection. The ventricles are normal in size. Vascular: Prior aneurysm clipping in the right MCA bifurcation region. No hyperdense vessel. Skull: Right pterional craniotomy. Sinuses/Orbits: Paranasal sinuses and mastoid air cells are clear. Bilateral cataract extraction. Other: None. IMPRESSION: No evidence of acute intracranial abnormality. Electronically Signed   By: Seymour Bars.D.  On: 11/02/2021 15:19   DG Elbow Complete Left  Result Date: 11/02/2021 CLINICAL DATA:  Fall, pain EXAM: LEFT ELBOW - COMPLETE 3+ VIEW  COMPARISON:  None Available. FINDINGS: Suspect a very subtle fracture of the left radial neck. There is no evidence of arthropathy or other focal bone abnormality. Soft tissues are unremarkable. IMPRESSION: Suspect a very subtle fracture of the left radial neck. Correlate for point tenderness. No elbow joint effusion. Electronically Signed   By: Delanna Ahmadi M.D.   On: 11/02/2021 15:16   DG Hip Unilat W or Wo Pelvis 2-3 Views Left  Result Date: 11/02/2021 CLINICAL DATA:  Fall, left hip pain EXAM: DG HIP (WITH OR WITHOUT PELVIS) 2-3V LEFT COMPARISON:  None Available. FINDINGS: Nondisplaced fracture of the left femoral neck, which appears to be transcervical or basicervical but is not clearly assessed given internal rotation on all views provided. No displaced fracture of the bony pelvis or proximal right femur seen in single frontal view. IMPRESSION: Nondisplaced fracture of the left femoral neck, which appears to be transcervical or basicervical but is not clearly assessed given internal rotation on all views provided. Consider additional radiographic views or CT to more clearly evaluate fracture anatomy. Electronically Signed   By: Delanna Ahmadi M.D.   On: 11/02/2021 15:14    EKG: Independently reviewed.   Assessment/Plan Active Problems:   Calculus of kidney   Ureteral obstruction, right   Severe sepsis [facility])   Acquired hypothyroidism   Left elbow fracture   S/p left hip fracture   Essential hypertension   Complicated UTI (urinary tract infection)  LEFT hip fracture - Patient fell late Friday night prior to early morning discharge on Saturday.  No painful to ambulate unbeknownst to patient had fracture. - Transfer to [facility] Dr. Vanetta Mulders orthopedic surgery to take patient to the OR in the A.m.  LEFT elbow fracture - Continue arm in sling - Further care per Dr. Vanetta Mulders orthopedic surgery  Obstructing right ureteral stones -6/7 s/p cystoscopy with bilateral stent  placement.  See below - 6/7 patient pain not adequately controlled - 6/7 pain control             Percocet 7.5-325 mg PRN             Fentanyl 50 mcg PRN if Percocet does not alleviate pain -6/9 per urology Dr. Alexis Frock okay to transition patient to Ciprofloxacin 500 mg BID x10 days -6/9 Urology to arrange outpatient ureteroscopy with goal of stone free in elective setting in several weeks   Severe sepsis/Complicated UTI -Diagnosis on discharge 6/9 presenting leukocytosis, tachycardia. Lactic acid level: -Continue current antibiotic x14 days   DM type II uncontrolled with hyperglycemia - Levemir 15 units - Moderate SSI   Essential HTN - Currently BP controlled without medication   Acquired hypothyroidism - 6/7 TSH= 3.4 WNL - Patient not on any thyroid medication on admission - states takes natural hormone replacement.   Hypokalemia/Hyperkalemia - Potassium goal> 4 -Repeat potassium WNL    Hypomagnesmia - Magnesium goal> 2   Mobility Assessment (last 72 hours)     Mobility Assessment   No documentation.           Code Status: Full (DVT Prophylaxis: Subcu heparin Family Communication: 6/11 sister at bedside for discussion of plan of care all questions answered Status is: Inpatient    Dispo: The patient is from: Home              Anticipated d/c is  to: SNF              Anticipated d/c date is: > 3 days              Patient currently is not medically stable to d/c.     Data Reviewed: Care during the described time interval was provided by me .  I have reviewed this patient's available data, including medical history, events of note, physical examination, and all test results as part of my evaluation.   The patient is critically ill with multiple organ systems failure and requires high complexity decision making for assessment and support, frequent evaluation and titration of therapies, application of advanced monitoring technologies and extensive  interpretation of multiple databases. Critical Care Time devoted to patient care services described in this note  Time spent: 70 minutes   Bartosz Luginbill, [facility]

## 2021-11-02 NOTE — ED Triage Notes (Signed)
BIBA Per EMS: Pt coming from home w/ c/o left hip pain and left arm pain from fall that occurred while hospitalized. Unable to bear weight. Weaker on L side. Neg for stroke screen per ems.  130/66 80HR  16RR 97% RA [id number]

## 2021-11-02 NOTE — ED Provider Triage Note (Signed)
Emergency Medicine Provider Triage Evaluation Note  Albana Saperstein , a 66 y.o. female  was evaluated in triage.  Pt complains of left hip and left elbow pain and arm and leg weakness its been there for 2 days.  Patient has been ciprofloxacin for 1 week secondary to septic stone.  Patient denies any facial droop, slurred speech, fever, chills.  Review of Systems  Positive:  Negative: See above   Physical Exam  BP 118/78 (BP Location: Right Arm)   Pulse 79   Temp 99 F (37.2 C) (Oral)   Resp 18   SpO2 96%  Gen:   Awake, no distress   Resp:  Normal effort  MSK:   Moves extremities without difficulty  Other:  Decreased grip strength on the left.  Patient has tender left hip to palpation.  No specific left elbow tenderness.  Medical Decision Making  Medically screening exam initiated at 2:14 PM.  Appropriate orders placed.  Chailyn Racette was informed that the remainder of the evaluation will be completed by another provider, this initial triage assessment does not replace that evaluation, and the importance of remaining in the ED until their evaluation is complete.  Suspicious for subacute stroke, fluoroquinolone tendinopathy.   Myna Bright [place], [place] 11/02/21 1415

## 2021-11-02 NOTE — ED Provider Notes (Signed)
[facility] DEPT Provider Note   CSN: [id number] Arrival date & time: 11/02/21  1305     History {Add pertinent medical, surgical, social history, OB history to HPI:1} Chief Complaint  Patient presents with   Hip Pain    Laura Gentry is a 66 y.o. female.  She was admitted a few days ago for nausea vomiting right flank pain and obstructing renal stones on the right.  She was treated for sepsis with antibiotics.  She was in DKA.  She received cystoscopy and bilateral ureteral stents.  She said she did have a fall 2 days ago in the hospital when her feet slipped out from under her she landed on her right side.  She said she was walking up until that moment and then was having difficulty walking on discharge.  She remembers looking at the instructions for the antibiotics they sent her home with the side effects calling joint pain.  She has been unable to ambulate complaining of severe pain on her left hip and in her left elbow.  She has had not had trauma since discharge of the hospital.  Today the pain was so severe that family called the ambulance to transport her back here.  Complaining of pain in her left hip and left elbow with any type of movement.  No numbness or weakness.  No recent fevers or abdominal pain.  The history is provided by the patient.  Hip Pain This is a new problem. The current episode started yesterday. The problem occurs constantly. The problem has not changed since onset.Pertinent negatives include no chest pain, no abdominal pain, no headaches and no shortness of breath. The symptoms are aggravated by bending, twisting and standing. Nothing relieves the symptoms. She has tried rest for the symptoms. The treatment provided no relief.       Home Medications Prior to Admission medications   Medication Sig Start Date End Date Taking? Authorizing Provider  acetaminophen (TYLENOL) 500 MG tablet Take 1,000 mg by mouth every 6 (six) hours  as needed for mild pain.    [provider]  alendronate (FOSAMAX) 70 MG tablet Take 70 mg by mouth once a week. Monday 09/27/21   [provider]  Ascorbic Acid (VITAMIN C PO) Take 500 mg by mouth daily.    [provider]  atorvastatin (LIPITOR) 40 MG tablet Take 1 tablet (40 mg total) by mouth daily. 01/31/18   Lorella Nimrod, MD  Blood Glucose Monitoring Suppl (ACCU-CHEK AVIVA PLUS) w/Device KIT The patient IS insulin requiring, ICD 10 code E11.9. The patient tests 4 times per day. 06/04/17   Lenore Cordia, MD  ciprofloxacin (CIPRO) 500 MG tablet Take 1 tablet (500 mg total) by mouth 2 (two) times daily. 11/01/21   Allie Bossier, MD  DULoxetine (CYMBALTA) 60 MG capsule Take 60 mg by mouth daily.    [provider]  gabapentin (NEURONTIN) 300 MG capsule Take 300 mg in AM, 300 mg at noon, 600 mg at bedtime Patient taking differently: Take 900 mg by mouth at bedtime. 09/21/16   Rivet, Sindy Guadeloupe, MD  glucose blood (ACCU-CHEK AVIVA PLUS) test strip Use as instructed. The patient IS insulin requiring, ICD 10 code E11.9. The patient tests 4 times per day. 09/23/16   Rivet, Sindy Guadeloupe, MD  Insulin Glargine (LANTUS SOLOSTAR) 100 UNIT/ML Solostar Pen Inject 25 Units into the skin at bedtime. 01/31/18   Lorella Nimrod, MD  Insulin Pen Needle 32G X 4 MM MISC  Use to inject lantus once dialy 10/29/17   Lorella Nimrod, MD  INVOKAMET XR 50-1000 MG TB24 TAKE 2 TABLETS BY MOUTH DAILY WITH BREAKFAST Patient taking differently: Take 2 tablets by mouth daily with breakfast. 03/26/18   Lorella Nimrod, MD  Lancets (ACCU-CHEK SOFT TOUCH) lancets Use as instructed 09/23/16   Rivet, Sindy Guadeloupe, MD  oxyCODONE-acetaminophen (PERCOCET) 7.5-325 MG tablet Take 1 tablet by mouth every 6 (six) hours as needed for severe pain. 11/01/21   Allie Bossier, MD  propranolol ER (INDERAL LA) 120 MG 24 hr capsule Take 40 mg by mouth in the morning, at noon, and at bedtime. Patient not taking: Reported on 10/29/2021 09/17/15    [provider]  traZODone (DESYREL) 50 MG tablet Take 1 tablet (50 mg total) by mouth at bedtime as needed for sleep. 11/01/21   Allie Bossier, MD  vitamin B-12 (CYANOCOBALAMIN) 1000 MCG tablet Take 1,000 mcg by mouth daily.    [provider]      Allergies    Bee venom, Penicillins, Pneumococcal vaccines, Sulfa antibiotics, and Bactrim [sulfamethoxazole-trimethoprim]    Review of Systems   Review of Systems  Constitutional:  Negative for fever.  HENT:  Negative for sore throat.   Eyes:  Negative for visual disturbance.  Respiratory:  Negative for shortness of breath.   Cardiovascular:  Negative for chest pain.  Gastrointestinal:  Negative for abdominal pain.  Genitourinary:  Negative for dysuria.  Musculoskeletal:  Negative for neck pain.  Skin:  Negative for rash.  Neurological:  Negative for headaches.    Physical Exam Updated Vital Signs BP 118/78 (BP Location: Right Arm)   Pulse 79   Temp 99 F (37.2 C) (Oral)   Resp 18   SpO2 96%  Physical Exam Vitals and nursing note reviewed.  Constitutional:      General: She is not in acute distress.    Appearance: Normal appearance. She is well-developed.  HENT:     Head: Normocephalic and atraumatic.  Eyes:     Conjunctiva/sclera: Conjunctivae normal.  Cardiovascular:     Rate and Rhythm: Normal rate and regular rhythm.     Heart sounds: No murmur heard. Pulmonary:     Effort: Pulmonary effort is normal. No respiratory distress.     Breath sounds: Normal breath sounds.  Abdominal:     Palpations: Abdomen is soft.     Tenderness: There is no abdominal tenderness.  Musculoskeletal:        General: Tenderness present. No deformity.     Cervical back: Neck supple.     Comments: She has significant tenderness around her left hip and increased pain with any type of range of motion.  Knee and ankle nontender.  She also has tenderness in her left arm primarily at her left elbow.  Wrist and shoulder  nontender.  Distal pulses motor and sensation intact.  Right upper and lower extremities nontender.  Neck and back nontender.  Skin:    General: Skin is warm and dry.     Capillary Refill: Capillary refill takes less than 2 seconds.  Neurological:     General: No focal deficit present.     Mental Status: She is alert.     Sensory: No sensory deficit.     Motor: No weakness.     ED Results / Procedures / Treatments   Labs (all labs ordered are listed, but only abnormal results are displayed) Labs Reviewed  CBC - Abnormal; Notable for the following components:  Result Value   RBC 5.31 (*)    MCV 78.0 (*)    MCH 24.5 (*)    All other components within normal limits  CBG MONITORING, ED - Abnormal; Notable for the following components:   Glucose-Capillary 252 (*)    All other components within normal limits  PROTIME-INR  APTT  DIFFERENTIAL    EKG EKG Interpretation  Date/Time:  Sunday November 02 2021 14:23:10 EDT Ventricular Rate:  82 PR Interval:  141 QRS Duration: 94 QT Interval:  391 QTC Calculation: 457 R Axis:   67 Text Interpretation: Sinus rhythm improved rate and nonspecific STs from prior 6/23 Confirmed by Aletta Edouard [phone]) on 11/02/2021 3:20:59 PM  Radiology CT HEAD WO CONTRAST  Result Date: 11/02/2021 CLINICAL DATA:  Neuro deficit, acute, stroke suspected. Left sided weakness. EXAM: CT HEAD WITHOUT CONTRAST TECHNIQUE: Contiguous axial images were obtained from the base of the skull through the vertex without intravenous contrast. RADIATION DOSE REDUCTION: This exam was performed according to the departmental dose-optimization program which includes automated exposure control, adjustment of the mA and/or kV according to patient size and/or use of iterative reconstruction technique. COMPARISON:  Head CT 10/28/2021 and MRI 12/31/2019 FINDINGS: Brain: Within limitations of streak artifact from a right MCA aneurysm clip, there is no evidence of an acute infarct,  intracranial hemorrhage, mass, midline shift, or extra-axial fluid collection. The ventricles are normal in size. Vascular: Prior aneurysm clipping in the right MCA bifurcation region. No hyperdense vessel. Skull: Right pterional craniotomy. Sinuses/Orbits: Paranasal sinuses and mastoid air cells are clear. Bilateral cataract extraction. Other: None. IMPRESSION: No evidence of acute intracranial abnormality. Electronically Signed   By: Logan Bores M.D.   On: 11/02/2021 15:19   DG Elbow Complete Left  Result Date: 11/02/2021 CLINICAL DATA:  Fall, pain EXAM: LEFT ELBOW - COMPLETE 3+ VIEW COMPARISON:  None Available. FINDINGS: Suspect a very subtle fracture of the left radial neck. There is no evidence of arthropathy or other focal bone abnormality. Soft tissues are unremarkable. IMPRESSION: Suspect a very subtle fracture of the left radial neck. Correlate for point tenderness. No elbow joint effusion. Electronically Signed   By: Delanna Ahmadi M.D.   On: 11/02/2021 15:16   DG Hip Unilat W or Wo Pelvis 2-3 Views Left  Result Date: 11/02/2021 CLINICAL DATA:  Fall, left hip pain EXAM: DG HIP (WITH OR WITHOUT PELVIS) 2-3V LEFT COMPARISON:  None Available. FINDINGS: Nondisplaced fracture of the left femoral neck, which appears to be transcervical or basicervical but is not clearly assessed given internal rotation on all views provided. No displaced fracture of the bony pelvis or proximal right femur seen in single frontal view. IMPRESSION: Nondisplaced fracture of the left femoral neck, which appears to be transcervical or basicervical but is not clearly assessed given internal rotation on all views provided. Consider additional radiographic views or CT to more clearly evaluate fracture anatomy. Electronically Signed   By: Delanna Ahmadi M.D.   On: 11/02/2021 15:14    Procedures Procedures  {Document cardiac monitor, telemetry assessment procedure when appropriate:1}  Medications Ordered in ED Medications   sodium chloride flush (NS) 0.9 % injection 3 mL (has no administration in time range)  fentaNYL (SUBLIMAZE) injection 50 mcg (has no administration in time range)    ED Course/ Medical Decision Making/ A&P                           Medical Decision Making Amount and/or Complexity  of Data Reviewed Labs: ordered. Radiology: ordered.  Risk Prescription drug management.  This patient complains of ***; this involves an extensive number of treatment Options and is a complaint that carries with it a high risk of complications and morbidity. The differential includes ***  I ordered, reviewed and interpreted labs, which included *** I ordered medication *** and reviewed PMP when indicated. I ordered imaging studies which included *** and I independently    visualized and interpreted imaging which showed *** Additional history obtained from *** Previous records obtained and reviewed *** I consulted *** and discussed lab and imaging findings and discussed disposition.  Cardiac monitoring reviewed, *** Social determinants considered, *** Critical Interventions: ***  After the interventions stated above, I reevaluated the patient and found *** Admission and further testing considered, ***    {Document critical care time when appropriate:1} {Document review of labs and clinical decision tools ie heart score, Chads2Vasc2 etc:1}  {Document your independent review of radiology images, and any outside records:1} {Document your discussion with family members, caretakers, and with consultants:1} {Document social determinants of health affecting pt's care:1} {Document your decision making why or why not admission, treatments were needed:1} Final Clinical Impression(s) / ED Diagnoses Final diagnoses:  None    Rx / DC Orders ED Discharge Orders     None

## 2021-11-03 ENCOUNTER — Encounter (HOSPITAL_COMMUNITY): Payer: Self-pay | Admitting: Internal Medicine

## 2021-11-03 ENCOUNTER — Inpatient Hospital Stay (HOSPITAL_COMMUNITY): Payer: Medicare HMO

## 2021-11-03 DIAGNOSIS — N200 Calculus of kidney: Secondary | ICD-10-CM

## 2021-11-03 DIAGNOSIS — N390 Urinary tract infection, site not specified: Secondary | ICD-10-CM | POA: Diagnosis not present

## 2021-11-03 DIAGNOSIS — E039 Hypothyroidism, unspecified: Secondary | ICD-10-CM | POA: Diagnosis not present

## 2021-11-03 DIAGNOSIS — D649 Anemia, unspecified: Secondary | ICD-10-CM | POA: Diagnosis present

## 2021-11-03 DIAGNOSIS — I10 Essential (primary) hypertension: Secondary | ICD-10-CM | POA: Diagnosis not present

## 2021-11-03 LAB — CBC WITH DIFFERENTIAL/PLATELET
Abs Immature Granulocytes: 0.04 10*3/uL (ref 0.00–0.07)
Basophils Absolute: 0.1 10*3/uL (ref 0.0–0.1)
Basophils Relative: 1 %
Eosinophils Absolute: 0.2 10*3/uL (ref 0.0–0.5)
Eosinophils Relative: 3 %
HCT: 34.2 % — ABNORMAL LOW (ref 36.0–46.0)
Hemoglobin: 10.8 g/dL — ABNORMAL LOW (ref 12.0–15.0)
Immature Granulocytes: 1 %
Lymphocytes Relative: 26 %
Lymphs Abs: 1.9 10*3/uL (ref 0.7–4.0)
MCH: 24.9 pg — ABNORMAL LOW (ref 26.0–34.0)
MCHC: 31.6 g/dL (ref 30.0–36.0)
MCV: 78.8 fL — ABNORMAL LOW (ref 80.0–100.0)
Monocytes Absolute: 0.8 10*3/uL (ref 0.1–1.0)
Monocytes Relative: 11 %
Neutro Abs: 4.3 10*3/uL (ref 1.7–7.7)
Neutrophils Relative %: 58 %
Platelets: 285 10*3/uL (ref 150–400)
RBC: 4.34 MIL/uL (ref 3.87–5.11)
RDW: 14.9 % (ref 11.5–15.5)
WBC: 7.3 10*3/uL (ref 4.0–10.5)
nRBC: 0 % (ref 0.0–0.2)

## 2021-11-03 LAB — BASIC METABOLIC PANEL
Anion gap: 6 (ref 5–15)
Anion gap: 8 (ref 5–15)
BUN: 13 mg/dL (ref 8–23)
BUN: 14 mg/dL (ref 8–23)
CO2: 27 mmol/L (ref 22–32)
CO2: 26 mmol/L (ref 22–32)
Calcium: 8.4 mg/dL — ABNORMAL LOW (ref 8.9–10.3)
Calcium: 9 mg/dL (ref 8.9–10.3)
Chloride: 104 mmol/L (ref 98–111)
Chloride: 103 mmol/L (ref 98–111)
Creatinine, Ser: 0.7 mg/dL (ref 0.44–1.00)
Creatinine, Ser: 0.8 mg/dL (ref 0.44–1.00)
GFR, Estimated: >60 mL/min (ref >60)
GFR, Estimated: >60 mL/min (ref >60)
Glucose, Bld: 164 mg/dL — ABNORMAL HIGH (ref 70–99)
Glucose, Bld: 90 mg/dL (ref 70–99)
Potassium: 3.7 mmol/L (ref 3.5–5.1)
Potassium: 4 mmol/L (ref 3.5–5.1)
Sodium: 137 mmol/L (ref 135–145)
Sodium: 137 mmol/L (ref 135–145)

## 2021-11-03 LAB — GLUCOSE, CAPILLARY
Glucose-Capillary: 177 mg/dL — ABNORMAL HIGH (ref 70–99)
Glucose-Capillary: 124 mg/dL — ABNORMAL HIGH (ref 70–99)
Glucose-Capillary: 103 mg/dL — ABNORMAL HIGH (ref 70–99)
Glucose-Capillary: 186 mg/dL — ABNORMAL HIGH (ref 70–99)

## 2021-11-03 LAB — CBG MONITORING, ED
Glucose-Capillary: 158 mg/dL — ABNORMAL HIGH (ref 70–99)
Glucose-Capillary: 158 mg/dL — ABNORMAL HIGH (ref 70–99)
Glucose-Capillary: 174 mg/dL — ABNORMAL HIGH (ref 70–99)
Glucose-Capillary: 263 mg/dL — ABNORMAL HIGH (ref 70–99)

## 2021-11-03 LAB — MAGNESIUM: Magnesium: 1.6 mg/dL — ABNORMAL LOW (ref 1.7–2.4)

## 2021-11-03 MED ORDER — MIDAZOLAM HCL 2 MG/2ML IJ SOLN
INTRAMUSCULAR | Status: AC
  Filled 2021-11-03: qty 2

## 2021-11-03 MED ORDER — CLINDAMYCIN PHOSPHATE 900 MG/50ML IV SOLN
900.0000 mg | Freq: Once | INTRAVENOUS | Status: AC
  Administered 2021-11-03: 900 mg via INTRAVENOUS
  Filled 2021-11-03 (×2): qty 50

## 2021-11-03 MED ORDER — DEXAMETHASONE SODIUM PHOSPHATE 10 MG/ML IJ SOLN
INTRAMUSCULAR | Status: AC
  Filled 2021-11-03: qty 1

## 2021-11-03 MED ORDER — CHLORHEXIDINE GLUCONATE 0.12 % MT SOLN
15.0000 mL | Freq: Once | OROMUCOSAL | Status: AC
Start: 2021-11-03 — End: 2021-11-03

## 2021-11-03 MED ORDER — CLINDAMYCIN PHOSPHATE 300 MG/50ML IV SOLN
900.0000 mg | Freq: Once | INTRAVENOUS | Status: DC
  Filled 2021-11-03: qty 150

## 2021-11-03 MED ORDER — SODIUM CHLORIDE 0.9 % IV SOLN: INTRAVENOUS | Status: DC

## 2021-11-03 MED ORDER — RIVAROXABAN 10 MG PO TABS
10.0000 mg | ORAL_TABLET | Freq: Every day | ORAL | Status: DC
  Administered 2021-11-03: 10 mg via ORAL
  Filled 2021-11-03: qty 1

## 2021-11-03 MED ORDER — PROPOFOL 10 MG/ML IV BOLUS
INTRAVENOUS | Status: AC
  Filled 2021-11-03: qty 20

## 2021-11-03 MED ORDER — ONDANSETRON HCL 4 MG/2ML IJ SOLN
INTRAMUSCULAR | Status: AC
  Filled 2021-11-03: qty 2

## 2021-11-03 MED ORDER — ACETAMINOPHEN 500 MG PO TABS: 1000.0000 mg | ORAL_TABLET | Freq: Once | ORAL | Status: DC

## 2021-11-03 MED ORDER — FENTANYL CITRATE (PF) 250 MCG/5ML IJ SOLN
INTRAMUSCULAR | Status: AC
  Filled 2021-11-03: qty 5

## 2021-11-03 MED ORDER — ORAL CARE MOUTH RINSE: 15.0000 mL | Freq: Once | OROMUCOSAL | Status: AC

## 2021-11-03 MED ORDER — VANCOMYCIN HCL 1500 MG/300ML IV SOLN: 1500.0000 mg | Freq: Once | INTRAVENOUS | Status: DC

## 2021-11-03 MED ORDER — ROCURONIUM BROMIDE 10 MG/ML (PF) SYRINGE
PREFILLED_SYRINGE | INTRAVENOUS | Status: AC
  Filled 2021-11-03: qty 10

## 2021-11-03 MED ORDER — LACTATED RINGERS IV SOLN: INTRAVENOUS | Status: DC

## 2021-11-03 MED ORDER — MAGNESIUM SULFATE 4 GM/100ML IV SOLN
4.0000 g | Freq: Once | INTRAVENOUS | Status: AC
  Administered 2021-11-03: 4 g via INTRAVENOUS
  Filled 2021-11-03 (×2): qty 100

## 2021-11-03 MED ORDER — LIDOCAINE 2% (20 MG/ML) 5 ML SYRINGE
INTRAMUSCULAR | Status: AC
  Filled 2021-11-03: qty 5

## 2021-11-03 MED ORDER — TRANEXAMIC ACID-NACL 1000-0.7 MG/100ML-% IV SOLN
1000.0000 mg | INTRAVENOUS | Status: DC
  Administered 2021-11-04: 1000 mg via INTRAVENOUS
  Filled 2021-11-03: qty 100

## 2021-11-03 MED ORDER — CHLORHEXIDINE GLUCONATE 4 % EX LIQD
60.0000 mL | Freq: Once | CUTANEOUS | Status: AC
  Administered 2021-11-03: 4 via TOPICAL

## 2021-11-03 MED ORDER — CEFAZOLIN SODIUM-DEXTROSE 2-4 GM/100ML-% IV SOLN
2.0000 g | INTRAVENOUS | Status: DC
  Filled 2021-11-03: qty 100

## 2021-11-03 MED ORDER — CHLORHEXIDINE GLUCONATE 0.12 % MT SOLN
OROMUCOSAL | Status: AC
  Administered 2021-11-03: 15 mL
  Filled 2021-11-03: qty 15

## 2021-11-03 NOTE — H&P (View-Only) (Signed)
ORTHOPAEDIC CONSULTATION  REQUESTING PHYSICIAN: Eugenie Filler, MD  Chief Complaint: Left femoral neck fracture  HPI: Laura Gentry is a 66 y.o. female who presents with a fall on the Friday prior to hospitalization.  She was admitted to [facility] long for treatment of an obstructing kidney stone.  She did have a fall that Friday night and was subsequently having pain and difficulty ambulating following this.  She was initially discharged and represented to the emergency room with ongoing left hip and groin pain.  X-rays were obtained which found an impacted femoral neck fracture.  She is having difficulty walking.  She does not use a cane or walker at baseline.  She is here in the hospital with her sister Shirlean Mylar who is at the bedside.  Past Medical History:  Diagnosis Date   Brain aneurysm 03/19/2013   Cataracts, bilateral    Cerebral aneurysm    Colon polyps    Diabetes ([place]) 2013   Headache    High cholesterol    History of kidney stones    Insomnia    Lumbosacral radiculopathy    Migraines    Osteoporosis    Skin cancer    squamous   Thyroid disease    Tremor    Past Surgical History:  Procedure Laterality Date   APPENDECTOMY     BRAIN SURGERY  03/19/13   Sugita aneyrsum clip can have MRI up to [place] scanned in    CHOLECYSTECTOMY  2013   [place], URETEROSCOPY AND STENT PLACEMENT Bilateral 10/29/2021   Procedure: [place], URETEROSCOPY AND STENT PLACEMENT;  Surgeon: Alexis Frock, MD;  Location: [facility];  Service: Urology;  Laterality: Bilateral;   INNER EAR SURGERY     Lost Hearing   TONSILLECTOMY AND ADENOIDECTOMY     VAGINAL HYSTERECTOMY     At age 70 due to "growth"   Social History   Socioeconomic History   Marital status: Divorced    Spouse name: Not on file   Number of children: 0   Years of education: Not on file   Highest education level: Some college, no degree  Occupational History    Occupation: Educational psychologist: [employer]    Comment: disabled  Tobacco Use   Smoking status: Former    Packs/day: 0.50    Years: 40.00    Total pack years: 20.00    Types: Cigarettes    Quit date: 05/26/2011    Years since quitting: 10.4   Smokeless tobacco: Never  Substance and Sexual Activity   Alcohol use: No    Alcohol/week: 0.0 standard drinks of alcohol   Drug use: No   Sexual activity: Never  Other Topics Concern   Not on file  Social History Narrative   09/08/21 her mom lives with her   Caffeine-tea   Social Determinants of Health   Financial Resource Strain: Not on file  Food Insecurity: Not on file  Transportation Needs: Not on file  Physical Activity: Not on file  Stress: Not on file  Social Connections: Not on file   Family History  Problem Relation Age of Onset   Asthma Mother    Allergies Mother    Diabetes Mother    Lung cancer Father        smoked   Throat cancer Father        smoked   Pancreatic cancer Brother    Hypertension Maternal Grandfather  Diabetes Maternal Grandfather    Emphysema Maternal Aunt        never smoked, spouse did   Breast cancer Maternal Aunt    Breast cancer Maternal Aunt    Breast cancer Paternal Aunt    - negative except otherwise stated in the family history section Allergies  Allergen Reactions   Bee Venom Shortness Of Breath   Penicillins Anaphylaxis    Remote reaction Throat swelling - compromised airway Tolerates ceftriaxone   Pneumococcal Vaccines     Per patient due to Brain Aneurysm   Sulfa Antibiotics Hives, Itching and Swelling   Bactrim [Sulfamethoxazole-Trimethoprim]     Felt off balance   Prior to Admission medications   Medication Sig Start Date End Date Taking? Authorizing Provider  acetaminophen (TYLENOL) 500 MG tablet Take 1,000 mg by mouth every 6 (six) hours as needed for mild pain.   Yes [provider]  Ascorbic Acid (VITAMIN C PO) Take 500 mg by mouth  daily.   Yes [provider]  atorvastatin (LIPITOR) 40 MG tablet Take 1 tablet (40 mg total) by mouth daily. 01/31/18  Yes Lorella Nimrod, MD  Blood Glucose Monitoring Suppl (ACCU-CHEK AVIVA PLUS) w/Device KIT The patient IS insulin requiring, ICD 10 code E11.9. The patient tests 4 times per day. 06/04/17  Yes Lenore Cordia, MD  ciprofloxacin (CIPRO) 500 MG tablet Take 1 tablet (500 mg total) by mouth 2 (two) times daily. 11/01/21  Yes Allie Bossier, MD  DULoxetine (CYMBALTA) 60 MG capsule Take 60 mg by mouth daily.   Yes [provider]  gabapentin (NEURONTIN) 300 MG capsule Take 300 mg in AM, 300 mg at noon, 600 mg at bedtime Patient taking differently: Take 900 mg by mouth at bedtime. 09/21/16  Yes Rivet, Carly J, MD  glucose blood (ACCU-CHEK AVIVA PLUS) test strip Use as instructed. The patient IS insulin requiring, ICD 10 code E11.9. The patient tests 4 times per day. 09/23/16  Yes Rivet, Sindy Guadeloupe, MD  Insulin Glargine (LANTUS SOLOSTAR) 100 UNIT/ML Solostar Pen Inject 25 Units into the skin at bedtime. 01/31/18  Yes Lorella Nimrod, MD  Insulin Pen Needle 32G X 4 MM MISC Use to inject lantus once dialy 10/29/17  Yes Lorella Nimrod, MD  INVOKAMET XR 50-1000 MG TB24 TAKE 2 TABLETS BY MOUTH DAILY WITH BREAKFAST Patient taking differently: Take 2 tablets by mouth daily with breakfast. 03/26/18  Yes Lorella Nimrod, MD  Lancets (ACCU-CHEK SOFT TOUCH) lancets Use as instructed 09/23/16  Yes Rivet, Sindy Guadeloupe, MD  oxyCODONE-acetaminophen (PERCOCET) 7.5-325 MG tablet Take 1 tablet by mouth every 6 (six) hours as needed for severe pain. 11/01/21  Yes Allie Bossier, MD  propranolol ER (INDERAL LA) 120 MG 24 hr capsule Take 40 mg by mouth in the morning, at noon, and at bedtime. 09/17/15  Yes [provider]  traZODone (DESYREL) 50 MG tablet Take 1 tablet (50 mg total) by mouth at bedtime as needed for sleep. 11/01/21  Yes Allie Bossier, MD  vitamin B-12 (CYANOCOBALAMIN) 1000 MCG tablet Take 1,000  mcg by mouth daily.   Yes [provider]  alendronate (FOSAMAX) 70 MG tablet Take 70 mg by mouth once a week. Monday 09/27/21   [provider]   CT Hip Left Wo Contrast  Result Date: 11/02/2021 CLINICAL DATA:  Hip pain, stress fracture suspected, neg xray EXAM: CT OF THE LEFT HIP WITHOUT CONTRAST TECHNIQUE: Multidetector CT imaging of the left hip was performed according to the standard  protocol. Multiplanar CT image reconstructions were also generated. RADIATION DOSE REDUCTION: This exam was performed according to the departmental dose-optimization program which includes automated exposure control, adjustment of the mA and/or kV according to patient size and/or use of iterative reconstruction technique. COMPARISON:  Same day radiograph FINDINGS: Bones/Joint/Cartilage There is an acute left femoral neck fracture with impaction and minimal displacement, which courses through the lateral head neck junction and through the proximal third of the femoral neck medially. Small joint effusion. Ligaments Suboptimally assessed by CT. Muscles and Tendons No acute myotendinous abnormality by CT. Soft tissues Mild adjacent soft tissue swelling. IMPRESSION: Acute left subcapital/transcervical left femoral neck fracture with impaction and minimal displacement. Electronically Signed   By: Maurine Simmering M.D.   On: 11/02/2021 16:38   DG Chest 1 View  Result Date: 11/02/2021 CLINICAL DATA:  Hip fracture. EXAM: CHEST  1 VIEW COMPARISON:  None Available. FINDINGS: Normal mediastinum and cardiac silhouette. Normal pulmonary vasculature. No evidence of effusion, infiltrate, or pneumothorax. No acute bony abnormality. IMPRESSION: No acute cardiopulmonary process. Electronically Signed   By: Suzy Bouchard M.D.   On: 11/02/2021 16:23   CT HEAD WO CONTRAST  Result Date: 11/02/2021 CLINICAL DATA:  Neuro deficit, acute, stroke suspected. Left sided weakness. EXAM: CT HEAD WITHOUT CONTRAST TECHNIQUE: Contiguous  axial images were obtained from the base of the skull through the vertex without intravenous contrast. RADIATION DOSE REDUCTION: This exam was performed according to the departmental dose-optimization program which includes automated exposure control, adjustment of the mA and/or kV according to patient size and/or use of iterative reconstruction technique. COMPARISON:  Head CT 10/28/2021 and MRI 12/31/2019 FINDINGS: Brain: Within limitations of streak artifact from a right MCA aneurysm clip, there is no evidence of an acute infarct, intracranial hemorrhage, mass, midline shift, or extra-axial fluid collection. The ventricles are normal in size. Vascular: Prior aneurysm clipping in the right MCA bifurcation region. No hyperdense vessel. Skull: Right pterional craniotomy. Sinuses/Orbits: Paranasal sinuses and mastoid air cells are clear. Bilateral cataract extraction. Other: None. IMPRESSION: No evidence of acute intracranial abnormality. Electronically Signed   By: Logan Bores M.D.   On: 11/02/2021 15:19   DG Elbow Complete Left  Result Date: 11/02/2021 CLINICAL DATA:  Fall, pain EXAM: LEFT ELBOW - COMPLETE 3+ VIEW COMPARISON:  None Available. FINDINGS: Suspect a very subtle fracture of the left radial neck. There is no evidence of arthropathy or other focal bone abnormality. Soft tissues are unremarkable. IMPRESSION: Suspect a very subtle fracture of the left radial neck. Correlate for point tenderness. No elbow joint effusion. Electronically Signed   By: Delanna Ahmadi M.D.   On: 11/02/2021 15:16   DG Hip Unilat W or Wo Pelvis 2-3 Views Left  Result Date: 11/02/2021 CLINICAL DATA:  Fall, left hip pain EXAM: DG HIP (WITH OR WITHOUT PELVIS) 2-3V LEFT COMPARISON:  None Available. FINDINGS: Nondisplaced fracture of the left femoral neck, which appears to be transcervical or basicervical but is not clearly assessed given internal rotation on all views provided. No displaced fracture of the bony pelvis or  proximal right femur seen in single frontal view. IMPRESSION: Nondisplaced fracture of the left femoral neck, which appears to be transcervical or basicervical but is not clearly assessed given internal rotation on all views provided. Consider additional radiographic views or CT to more clearly evaluate fracture anatomy. Electronically Signed   By: Delanna Ahmadi M.D.   On: 11/02/2021 15:14     Positive ROS: All other systems have been reviewed and  were otherwise negative with the exception of those mentioned in the HPI and as above.  Physical Exam: General: No acute distress Cardiovascular: No pedal edema Respiratory: No cyanosis, no use of accessory musculature GI: No organomegaly, abdomen is soft and non-tender Skin: No lesions in the area of chief complaint Neurologic: Sensation intact distally Psychiatric: Patient is at baseline mood and affect Lymphatic: No axillary or cervical lymphadenopathy  MUSCULOSKELETAL:  Limb lengths are equal.  There is groin tenderness.  Pain with gentle logroll.  Distal neurosensory exam is intact.  Fires EHL as well as tibialis anterior and gastrocsoleus.  2+ dorsalis pedis pulse  Independent Imaging Review: AP pelvis, 2 views left hip, CT scan left hip, 3 views left elbow: There is an impacted subcapital femoral neck fracture on the left side  Nondisplaced left radial head fracture  Assessment: 65 year old female with a left impacted femoral neck fracture after a fall while in the hospital.  I did discuss risks and benefits of treatment of this hip fracture.  She is currently having a very difficult time ambulating and mobilizing.  That effect I did discuss that screw fixation would allow for more stability while we work to mobilize her.  I did discuss the nonzero risk of fracture displacement in the absence of surgical fixation.  She understands this and would like to proceed with surgical screw fixation in order to optimize her long-term outcome and early  mobilization.  We did discuss the risk that, specifically with percutaneous screw fixation including avascular necrosis and need for repeat surgery.  She understands this and would like to proceed  Plan: Plan for left hip cannulated screw placement   After a lengthy discussion of treatment options, including risks, benefits, alternatives, complications of surgical and nonsurgical conservative options, the patient elected surgical repair.   The patient  is aware of the material risks  and complications including, but not limited to injury to adjacent structures, neurovascular injury, infection, numbness, bleeding, implant failure, thermal burns, stiffness, persistent pain, failure to heal, disease transmission from allograft, need for further surgery, dislocation, anesthetic risks, blood clots, risks of death,and others. The probabilities of surgical success and failure discussed with patient given their particular co-morbidities.The time and nature of expected rehabilitation and recovery was discussed.The patient's questions were all answered preoperatively.  No barriers to understanding were noted. I explained the natural history of the disease process and Rx rationale.  I explained to the patient what I considered to be reasonable expectations given their personal situation.  The final treatment plan was arrived at through a shared patient decision making process model.   Thank you for the consult and the opportunity to see Ms. Juliet Rude, MD [facility] 2:26 PM

## 2021-11-03 NOTE — Op Note (Deleted)
ORTHOPAEDIC CONSULTATION  REQUESTING PHYSICIAN: Eugenie Filler, MD  Chief Complaint: Left femoral neck fracture  HPI: Laura Gentry is a 66 y.o. female who presents with a fall on the Friday prior to hospitalization.  She was admitted to [facility] long for treatment of an obstructing kidney stone.  She did have a fall that Friday night and was subsequently having pain and difficulty ambulating following this.  She was initially discharged and represented to the emergency room with ongoing left hip and groin pain.  X-rays were obtained which found an impacted femoral neck fracture.  She is having difficulty walking.  She does not use a cane or walker at baseline.  She is here in the hospital with her sister Shirlean Mylar who is at the bedside.  Past Medical History:  Diagnosis Date   Brain aneurysm 03/19/2013   Cataracts, bilateral    Cerebral aneurysm    Colon polyps    Diabetes ([place]) 2013   Headache    High cholesterol    History of kidney stones    Insomnia    Lumbosacral radiculopathy    Migraines    Osteoporosis    Skin cancer    squamous   Thyroid disease    Tremor    Past Surgical History:  Procedure Laterality Date   APPENDECTOMY     BRAIN SURGERY  03/19/13   [place]ita aneyrsum clip can have MRI up to [place] scanned in    CHOLECYSTECTOMY  2013   [place], URETEROSCOPY AND STENT PLACEMENT Bilateral 10/29/2021   Procedure: [place], URETEROSCOPY AND STENT PLACEMENT;  Surgeon: Alexis Frock, MD;  Location: [facility];  Service: Urology;  Laterality: Bilateral;   INNER EAR SURGERY     Lost Hearing   TONSILLECTOMY AND ADENOIDECTOMY     VAGINAL HYSTERECTOMY     At age 11 due to "growth"   Social History   Socioeconomic History   Marital status: Divorced    Spouse name: Not on file   Number of children: 0   Years of education: Not on file   Highest education level: Some college, no degree  Occupational History    Occupation: Educational psychologist: [employer]    Comment: disabled  Tobacco Use   Smoking status: Former    Packs/day: 0.50    Years: 40.00    Total pack years: 20.00    Types: Cigarettes    Quit date: 05/26/2011    Years since quitting: 10.4   Smokeless tobacco: Never  Substance and Sexual Activity   Alcohol use: No    Alcohol/week: 0.0 standard drinks of alcohol   Drug use: No   Sexual activity: Never  Other Topics Concern   Not on file  Social History Narrative   09/08/21 her mom lives with her   Caffeine-tea   Social Determinants of Health   Financial Resource Strain: Not on file  Food Insecurity: Not on file  Transportation Needs: Not on file  Physical Activity: Not on file  Stress: Not on file  Social Connections: Not on file   Family History  Problem Relation Age of Onset   Asthma Mother    Allergies Mother    Diabetes Mother    Lung cancer Father        smoked   Throat cancer Father        smoked   Pancreatic cancer Brother    Hypertension Maternal Grandfather  Diabetes Maternal Grandfather    Emphysema Maternal Aunt        never smoked, spouse did   Breast cancer Maternal Aunt    Breast cancer Maternal Aunt    Breast cancer Paternal Aunt    - negative except otherwise stated in the family history section Allergies  Allergen Reactions   Bee Venom Shortness Of Breath   Penicillins Anaphylaxis    Remote reaction Throat swelling - compromised airway Tolerates ceftriaxone   Pneumococcal Vaccines     Per patient due to Brain Aneurysm   Sulfa Antibiotics Hives, Itching and Swelling   Bactrim [Sulfamethoxazole-Trimethoprim]     Felt off balance   Prior to Admission medications   Medication Sig Start Date End Date Taking? Authorizing Provider  acetaminophen (TYLENOL) 500 MG tablet Take 1,000 mg by mouth every 6 (six) hours as needed for mild pain.   Yes [provider]  Ascorbic Acid (VITAMIN C PO) Take 500 mg by mouth  daily.   Yes [provider]  atorvastatin (LIPITOR) 40 MG tablet Take 1 tablet (40 mg total) by mouth daily. 01/31/18  Yes Lorella Nimrod, MD  Blood Glucose Monitoring Suppl (ACCU-CHEK AVIVA PLUS) w/Device KIT The patient IS insulin requiring, ICD 10 code E11.9. The patient tests 4 times per day. 06/04/17  Yes Lenore Cordia, MD  ciprofloxacin (CIPRO) 500 MG tablet Take 1 tablet (500 mg total) by mouth 2 (two) times daily. 11/01/21  Yes Allie Bossier, MD  DULoxetine (CYMBALTA) 60 MG capsule Take 60 mg by mouth daily.   Yes [provider]  gabapentin (NEURONTIN) 300 MG capsule Take 300 mg in AM, 300 mg at noon, 600 mg at bedtime Patient taking differently: Take 900 mg by mouth at bedtime. 09/21/16  Yes Rivet, Carly J, MD  glucose blood (ACCU-CHEK AVIVA PLUS) test strip Use as instructed. The patient IS insulin requiring, ICD 10 code E11.9. The patient tests 4 times per day. 09/23/16  Yes Rivet, Sindy [place], MD  Insulin Glargine (LANTUS SOLOSTAR) 100 UNIT/ML Solostar Pen Inject 25 Units into the skin at bedtime. 01/31/18  Yes Lorella Nimrod, MD  Insulin Pen Needle 32G X 4 MM MISC Use to inject lantus once dialy 10/29/17  Yes Lorella Nimrod, MD  INVOKAMET XR 50-1000 MG TB24 TAKE 2 TABLETS BY MOUTH DAILY WITH BREAKFAST Patient taking differently: Take 2 tablets by mouth daily with breakfast. 03/26/18  Yes Lorella Nimrod, MD  Lancets (ACCU-CHEK SOFT TOUCH) lancets Use as instructed 09/23/16  Yes Rivet, Sindy Gu[place], MD  oxyCODONE-acetaminophen (PERCOCET) 7.5-325 MG tablet Take 1 tablet by mouth every 6 (six) hours as needed for severe pain. 11/01/21  Yes Allie Bossier, MD  propranolol ER (INDERAL LA) 120 MG 24 hr capsule Take 40 mg by mouth in the morning, at noon, and at bedtime. 09/17/15  Yes [provider]  traZODone (DESYREL) 50 MG tablet Take 1 tablet (50 mg total) by mouth at bedtime as needed for sleep. 11/01/21  Yes Allie Bossier, MD  vitamin B-12 (CYANOCOBALAMIN) 1000 MCG tablet Take 1,000  mcg by mouth daily.   Yes [provider]  alendronate (FOSAMAX) 70 MG tablet Take 70 mg by mouth once a week. Monday 09/27/21   [provider]   CT Hip Left Wo Contrast  Result Date: 11/02/2021 CLINICAL DATA:  Hip pain, stress fracture suspected, neg xray EXAM: CT OF THE LEFT HIP WITHOUT CONTRAST TECHNIQUE: Multidetector CT imaging of the left hip was performed according to the standard  protocol. Multiplanar CT image reconstructions were also generated. RADIATION DOSE REDUCTION: This exam was performed according to the departmental dose-optimization program which includes automated exposure control, adjustment of the mA and/or kV according to patient size and/or use of iterative reconstruction technique. COMPARISON:  Same day radiograph FINDINGS: Bones/Joint/Cartilage There is an acute left femoral neck fracture with impaction and minimal displacement, which courses through the lateral head neck junction and through the proximal third of the femoral neck medially. Small joint effusion. Ligaments Suboptimally assessed by CT. Muscles and Tendons No acute myotendinous abnormality by CT. Soft tissues Mild adjacent soft tissue swelling. IMPRESSION: Acute left subcapital/transcervical left femoral neck fracture with impaction and minimal displacement. Electronically Signed   By: Maurine Simmering M.D.   On: 11/02/2021 16:38   DG Chest 1 View  Result Date: 11/02/2021 CLINICAL DATA:  Hip fracture. EXAM: CHEST  1 VIEW COMPARISON:  None Available. FINDINGS: Normal mediastinum and cardiac silhouette. Normal pulmonary vasculature. No evidence of effusion, infiltrate, or pneumothorax. No acute bony abnormality. IMPRESSION: No acute cardiopulmonary process. Electronically Signed   By: Suzy Bouchard M.D.   On: 11/02/2021 16:23   CT HEAD WO CONTRAST  Result Date: 11/02/2021 CLINICAL DATA:  Neuro deficit, acute, stroke suspected. Left sided weakness. EXAM: CT HEAD WITHOUT CONTRAST TECHNIQUE: Contiguous  axial images were obtained from the base of the skull through the vertex without intravenous contrast. RADIATION DOSE REDUCTION: This exam was performed according to the departmental dose-optimization program which includes automated exposure control, adjustment of the mA and/or kV according to patient size and/or use of iterative reconstruction technique. COMPARISON:  Head CT 10/28/2021 and MRI 12/31/2019 FINDINGS: Brain: Within limitations of streak artifact from a right MCA aneurysm clip, there is no evidence of an acute infarct, intracranial hemorrhage, mass, midline shift, or extra-axial fluid collection. The ventricles are normal in size. Vascular: Prior aneurysm clipping in the right MCA bifurcation region. No hyperdense vessel. Skull: Right pterional craniotomy. Sinuses/Orbits: Paranasal sinuses and mastoid air cells are clear. Bilateral cataract extraction. Other: None. IMPRESSION: No evidence of acute intracranial abnormality. Electronically Signed   By: Logan Bores M.D.   On: 11/02/2021 15:19   DG Elbow Complete Left  Result Date: 11/02/2021 CLINICAL DATA:  Fall, pain EXAM: LEFT ELBOW - COMPLETE 3+ VIEW COMPARISON:  None Available. FINDINGS: Suspect a very subtle fracture of the left radial neck. There is no evidence of arthropathy or other focal bone abnormality. Soft tissues are unremarkable. IMPRESSION: Suspect a very subtle fracture of the left radial neck. Correlate for point tenderness. No elbow joint effusion. Electronically Signed   By: Delanna Ahmadi M.D.   On: 11/02/2021 15:16   DG Hip Unilat W or Wo Pelvis 2-3 Views Left  Result Date: 11/02/2021 CLINICAL DATA:  Fall, left hip pain EXAM: DG HIP (WITH OR WITHOUT PELVIS) 2-3V LEFT COMPARISON:  None Available. FINDINGS: Nondisplaced fracture of the left femoral neck, which appears to be transcervical or basicervical but is not clearly assessed given internal rotation on all views provided. No displaced fracture of the bony pelvis or  proximal right femur seen in single frontal view. IMPRESSION: Nondisplaced fracture of the left femoral neck, which appears to be transcervical or basicervical but is not clearly assessed given internal rotation on all views provided. Consider additional radiographic views or CT to more clearly evaluate fracture anatomy. Electronically Signed   By: Delanna Ahmadi M.D.   On: 11/02/2021 15:14     Positive ROS: All other systems have been reviewed and  were otherwise negative with the exception of those mentioned in the HPI and as above.  Physical Exam: General: No acute distress Cardiovascular: No pedal edema Respiratory: No cyanosis, no use of accessory musculature GI: No organomegaly, abdomen is soft and non-tender Skin: No lesions in the area of chief complaint Neurologic: Sensation intact distally Psychiatric: Patient is at baseline mood and affect Lymphatic: No axillary or cervical lymphadenopathy  MUSCULOSKELETAL:  Limb lengths are equal.  There is groin tenderness.  Pain with gentle logroll.  Distal neurosensory exam is intact.  Fires EHL as well as tibialis anterior and gastrocsoleus.  2+ dorsalis pedis pulse  Independent Imaging Review: AP pelvis, 2 views left hip, CT scan left hip, 3 views left elbow: There is an impacted subcapital femoral neck fracture on the left side  Nondisplaced left radial head fracture  Assessment: 66 year old female with a left impacted femoral neck fracture after a fall while in the hospital.  I did discuss risks and benefits of treatment of this hip fracture.  She is currently having a very difficult time ambulating and mobilizing.  That effect I did discuss that screw fixation would allow for more stability while we work to mobilize her.  I did discuss the nonzero risk of fracture displacement in the absence of surgical fixation.  She understands this and would like to proceed with surgical screw fixation in order to optimize her long-term outcome and early  mobilization.  We did discuss the risk that, specifically with percutaneous screw fixation including avascular necrosis and need for repeat surgery.  She understands this and would like to proceed  Plan: Plan for left hip cannulated screw placement   After a lengthy discussion of treatment options, including risks, benefits, alternatives, complications of surgical and nonsurgical conservative options, the patient elected surgical repair.   The patient  is aware of the material risks  and complications including, but not limited to injury to adjacent structures, neurovascular injury, infection, numbness, bleeding, implant failure, thermal burns, stiffness, persistent pain, failure to heal, disease transmission from allograft, need for further surgery, dislocation, anesthetic risks, blood clots, risks of death,and others. The probabilities of surgical success and failure discussed with patient given their particular co-morbidities.The time and nature of expected rehabilitation and recovery was discussed.The patient's questions were all answered preoperatively.  No barriers to understanding were noted. I explained the natural history of the disease process and Rx rationale.  I explained to the patient what I considered to be reasonable expectations given their personal situation.  The final treatment plan was arrived at through a shared patient decision making process model.   Thank you for the consult and the opportunity to see Ms. Juliet Rude, MD [facility] 2:26 PM

## 2021-11-03 NOTE — Progress Notes (Signed)
Laura Gentry is currently admitted to [facility] after sustaining a fall and sustaining a hip fracture.   She was seen by [facility] 6/6 at which time bilateral ureteral stents were placed due to ureteral obstruction in the setting of possible GU infection. She was discharged on 6/10, but returned to the hospital yesterday after sustaining a hip fracture.   Today, it was noted that she had not had any urine output. Her creatinine was 0.7 this AM and 0.8 on repeat this afternoon. Her bladder scan is 460, consistent with urinary retention. At this time, I recommend placement of a foley. Pending her hospital course, she could be voiding trialed as in inpatient or f/u can be arranged with [place] to perform. Please call with any other questions.

## 2021-11-03 NOTE — Consult Note (Signed)
ORTHOPAEDIC CONSULTATION  REQUESTING PHYSICIAN: Eugenie Filler, MD  Chief Complaint: Left femoral neck fracture  HPI: Laura Gentry is a 66 y.o. female who presents with a fall on the Friday prior to hospitalization.  She was admitted to [facility] long for treatment of an obstructing kidney stone.  She did have a fall that Friday night and was subsequently having pain and difficulty ambulating following this.  She was initially discharged and represented to the emergency room with ongoing left hip and groin pain.  X-rays were obtained which found an impacted femoral neck fracture.  She is having difficulty walking.  She does not use a cane or walker at baseline.  She is here in the hospital with her sister Laura Gentry who is at the bedside.  Past Medical History:  Diagnosis Date   Brain aneurysm 03/19/2013   Cataracts, bilateral    Cerebral aneurysm    Colon polyps    Diabetes ([place]) 2013   Headache    High cholesterol    History of kidney stones    Insomnia    Lumbosacral radiculopathy    Migraines    Osteoporosis    Skin cancer    squamous   Thyroid disease    Tremor    Past Surgical History:  Procedure Laterality Date   APPENDECTOMY     BRAIN SURGERY  03/19/13   [place] aneyrsum clip can have MRI up to [place] scanned in    CHOLECYSTECTOMY  2013   [place], URETEROSCOPY AND STENT PLACEMENT Bilateral 10/29/2021   Procedure: [place], URETEROSCOPY AND STENT PLACEMENT;  Surgeon: Alexis Frock, MD;  Location: [facility];  Service: Urology;  Laterality: Bilateral;   INNER EAR SURGERY     Lost Hearing   TONSILLECTOMY AND ADENOIDECTOMY     VAGINAL HYSTERECTOMY     At age 57 due to "growth"   Social History   Socioeconomic History   Marital status: Divorced    Spouse name: Not on file   Number of children: 0   Years of education: Not on file   Highest education level: Some college, no degree  Occupational History    Occupation: Educational psychologist: [employer]    Comment: disabled  Tobacco Use   Smoking status: Former    Packs/day: 0.50    Years: 40.00    Total pack years: 20.00    Types: Cigarettes    Quit date: 05/26/2011    Years since quitting: 10.4   Smokeless tobacco: Never  Substance and Sexual Activity   Alcohol use: No    Alcohol/week: 0.0 standard drinks of alcohol   Drug use: No   Sexual activity: Never  Other Topics Concern   Not on file  Social History Narrative   09/08/21 her mom lives with her   Caffeine-tea   Social Determinants of Health   Financial Resource Strain: Not on file  Food Insecurity: Not on file  Transportation Needs: Not on file  Physical Activity: Not on file  Stress: Not on file  Social Connections: Not on file   Family History  Problem Relation Age of Onset   Asthma Mother    Allergies Mother    Diabetes Mother    Lung cancer Father        smoked   Throat cancer Father        smoked   Pancreatic cancer Brother    Hypertension Maternal Grandfather  Diabetes Maternal Grandfather    Emphysema Maternal Aunt        never smoked, spouse did   Breast cancer Maternal Aunt    Breast cancer Maternal Aunt    Breast cancer Paternal Aunt    - negative except otherwise stated in the family history section Allergies  Allergen Reactions   Bee Venom Shortness Of Breath   Penicillins Anaphylaxis    Remote reaction Throat swelling - compromised airway Tolerates ceftriaxone   Pneumococcal Vaccines     Per patient due to Brain Aneurysm   Sulfa Antibiotics Hives, Itching and Swelling   Bactrim [Sulfamethoxazole-Trimethoprim]     Felt off balance   Prior to Admission medications   Medication Sig Start Date End Date Taking? Authorizing Provider  acetaminophen (TYLENOL) 500 MG tablet Take 1,000 mg by mouth every 6 (six) hours as needed for mild pain.   Yes [provider]  Ascorbic Acid (VITAMIN C PO) Take 500 mg by mouth  daily.   Yes [provider]  atorvastatin (LIPITOR) 40 MG tablet Take 1 tablet (40 mg total) by mouth daily. 01/31/18  Yes Lorella Nimrod, MD  Blood Glucose Monitoring Suppl (ACCU-CHEK AVIVA PLUS) w/Device KIT The patient IS insulin requiring, ICD 10 code E11.9. The patient tests 4 times per day. 06/04/17  Yes Lenore Cordia, MD  ciprofloxacin (CIPRO) 500 MG tablet Take 1 tablet (500 mg total) by mouth 2 (two) times daily. 11/01/21  Yes Allie Bossier, MD  DULoxetine (CYMBALTA) 60 MG capsule Take 60 mg by mouth daily.   Yes [provider]  gabapentin (NEURONTIN) 300 MG capsule Take 300 mg in AM, 300 mg at noon, 600 mg at bedtime Patient taking differently: Take 900 mg by mouth at bedtime. 09/21/16  Yes Rivet, Carly J, MD  glucose blood (ACCU-CHEK AVIVA PLUS) test strip Use as instructed. The patient IS insulin requiring, ICD 10 code E11.9. The patient tests 4 times per day. 09/23/16  Yes Rivet, Sindy Guadel[place], MD  Insulin Glargine (LANTUS SOLOSTAR) 100 UNIT/ML Solostar Pen Inject 25 Units into the skin at bedtime. 01/31/18  Yes Lorella Nimrod, MD  Insulin Pen Needle 32G X 4 MM MISC Use to inject lantus once dialy 10/29/17  Yes Lorella Nimrod, MD  INVOKAMET XR 50-1000 MG TB24 TAKE 2 TABLETS BY MOUTH DAILY WITH BREAKFAST Patient taking differently: Take 2 tablets by mouth daily with breakfast. 03/26/18  Yes Lorella Nimrod, MD  Lancets (ACCU-CHEK SOFT TOUCH) lancets Use as instructed 09/23/16  Yes Rivet, Sindy Guadel[place], MD  oxyCODONE-acetaminophen (PERCOCET) 7.5-325 MG tablet Take 1 tablet by mouth every 6 (six) hours as needed for severe pain. 11/01/21  Yes Allie Bossier, MD  propranolol ER (INDERAL LA) 120 MG 24 hr capsule Take 40 mg by mouth in the morning, at noon, and at bedtime. 09/17/15  Yes [provider]  traZODone (DESYREL) 50 MG tablet Take 1 tablet (50 mg total) by mouth at bedtime as needed for sleep. 11/01/21  Yes Allie Bossier, MD  vitamin B-12 (CYANOCOBALAMIN) 1000 MCG tablet Take 1,000  mcg by mouth daily.   Yes [provider]  alendronate (FOSAMAX) 70 MG tablet Take 70 mg by mouth once a week. Monday 09/27/21   [provider]   CT Hip Left Wo Contrast  Result Date: 11/02/2021 CLINICAL DATA:  Hip pain, stress fracture suspected, neg xray EXAM: CT OF THE LEFT HIP WITHOUT CONTRAST TECHNIQUE: Multidetector CT imaging of the left hip was performed according to the standard  protocol. Multiplanar CT image reconstructions were also generated. RADIATION DOSE REDUCTION: This exam was performed according to the departmental dose-optimization program which includes automated exposure control, adjustment of the mA and/or kV according to patient size and/or use of iterative reconstruction technique. COMPARISON:  Same day radiograph FINDINGS: Bones/Joint/Cartilage There is an acute left femoral neck fracture with impaction and minimal displacement, which courses through the lateral head neck junction and through the proximal third of the femoral neck medially. Small joint effusion. Ligaments Suboptimally assessed by CT. Muscles and Tendons No acute myotendinous abnormality by CT. Soft tissues Mild adjacent soft tissue swelling. IMPRESSION: Acute left subcapital/transcervical left femoral neck fracture with impaction and minimal displacement. Electronically Signed   By: Maurine Simmering M.D.   On: 11/02/2021 16:38   DG Chest 1 View  Result Date: 11/02/2021 CLINICAL DATA:  Hip fracture. EXAM: CHEST  1 VIEW COMPARISON:  None Available. FINDINGS: Normal mediastinum and cardiac silhouette. Normal pulmonary vasculature. No evidence of effusion, infiltrate, or pneumothorax. No acute bony abnormality. IMPRESSION: No acute cardiopulmonary process. Electronically Signed   By: Suzy Bouchard M.D.   On: 11/02/2021 16:23   CT HEAD WO CONTRAST  Result Date: 11/02/2021 CLINICAL DATA:  Neuro deficit, acute, stroke suspected. Left sided weakness. EXAM: CT HEAD WITHOUT CONTRAST TECHNIQUE: Contiguous  axial images were obtained from the base of the skull through the vertex without intravenous contrast. RADIATION DOSE REDUCTION: This exam was performed according to the departmental dose-optimization program which includes automated exposure control, adjustment of the mA and/or kV according to patient size and/or use of iterative reconstruction technique. COMPARISON:  Head CT 10/28/2021 and MRI 12/31/2019 FINDINGS: Brain: Within limitations of streak artifact from a right MCA aneurysm clip, there is no evidence of an acute infarct, intracranial hemorrhage, mass, midline shift, or extra-axial fluid collection. The ventricles are normal in size. Vascular: Prior aneurysm clipping in the right MCA bifurcation region. No hyperdense vessel. Skull: Right pterional craniotomy. Sinuses/Orbits: Paranasal sinuses and mastoid air cells are clear. Bilateral cataract extraction. Other: None. IMPRESSION: No evidence of acute intracranial abnormality. Electronically Signed   By: Logan Bores M.D.   On: 11/02/2021 15:19   DG Elbow Complete Left  Result Date: 11/02/2021 CLINICAL DATA:  Fall, pain EXAM: LEFT ELBOW - COMPLETE 3+ VIEW COMPARISON:  None Available. FINDINGS: Suspect a very subtle fracture of the left radial neck. There is no evidence of arthropathy or other focal bone abnormality. Soft tissues are unremarkable. IMPRESSION: Suspect a very subtle fracture of the left radial neck. Correlate for point tenderness. No elbow joint effusion. Electronically Signed   By: Delanna Ahmadi M.D.   On: 11/02/2021 15:16   DG Hip Unilat W or Wo Pelvis 2-3 Views Left  Result Date: 11/02/2021 CLINICAL DATA:  Fall, left hip pain EXAM: DG HIP (WITH OR WITHOUT PELVIS) 2-3V LEFT COMPARISON:  None Available. FINDINGS: Nondisplaced fracture of the left femoral neck, which appears to be transcervical or basicervical but is not clearly assessed given internal rotation on all views provided. No displaced fracture of the bony pelvis or  proximal right femur seen in single frontal view. IMPRESSION: Nondisplaced fracture of the left femoral neck, which appears to be transcervical or basicervical but is not clearly assessed given internal rotation on all views provided. Consider additional radiographic views or CT to more clearly evaluate fracture anatomy. Electronically Signed   By: Delanna Ahmadi M.D.   On: 11/02/2021 15:14     Positive ROS: All other systems have been reviewed and  were otherwise negative with the exception of those mentioned in the HPI and as above.  Physical Exam: General: No acute distress Cardiovascular: No pedal edema Respiratory: No cyanosis, no use of accessory musculature GI: No organomegaly, abdomen is soft and non-tender Skin: No lesions in the area of chief complaint Neurologic: Sensation intact distally Psychiatric: Patient is at baseline mood and affect Lymphatic: No axillary or cervical lymphadenopathy  MUSCULOSKELETAL:  Limb lengths are equal.  There is groin tenderness.  Pain with gentle logroll.  Distal neurosensory exam is intact.  Fires EHL as well as tibialis anterior and gastrocsoleus.  2+ dorsalis pedis pulse  Independent Imaging Review: AP pelvis, 2 views left hip, CT scan left hip, 3 views left elbow: There is an impacted subcapital femoral neck fracture on the left side  Nondisplaced left radial head fracture  Assessment: 66 year old female with a left impacted femoral neck fracture after a fall while in the hospital.  I did discuss risks and benefits of treatment of this hip fracture.  She is currently having a very difficult time ambulating and mobilizing.  That effect I did discuss that screw fixation would allow for more stability while we work to mobilize her.  I did discuss the nonzero risk of fracture displacement in the absence of surgical fixation.  She understands this and would like to proceed with surgical screw fixation in order to optimize her long-term outcome and early  mobilization.  We did discuss the risk that, specifically with percutaneous screw fixation including avascular necrosis and need for repeat surgery.  She understands this and would like to proceed  Plan: Plan for left hip cannulated screw placement   After a lengthy discussion of treatment options, including risks, benefits, alternatives, complications of surgical and nonsurgical conservative options, the patient elected surgical repair.   The patient  is aware of the material risks  and complications including, but not limited to injury to adjacent structures, neurovascular injury, infection, numbness, bleeding, implant failure, thermal burns, stiffness, persistent pain, failure to heal, disease transmission from allograft, need for further surgery, dislocation, anesthetic risks, blood clots, risks of death,and others. The probabilities of surgical success and failure discussed with patient given their particular co-morbidities.The time and nature of expected rehabilitation and recovery was discussed.The patient's questions were all answered preoperatively.  No barriers to understanding were noted. I explained the natural history of the disease process and Rx rationale.  I explained to the patient what I considered to be reasonable expectations given their personal situation.  The final treatment plan was arrived at through a shared patient decision making process model.   Thank you for the consult and the opportunity to see Ms. Juliet Rude, MD [facility] 2:26 PM

## 2021-11-03 NOTE — Plan of Care (Signed)
Called to discuss care of patient who is status post fall with a left femoral neck fracture.  At this time we will plan for transfer to [facility] for definitive fixation today.  Appreciate medical consultation.  Please ensure that she is n.p.o. for operating room today.  Full consult to follow.

## 2021-11-03 NOTE — Progress Notes (Signed)
PROGRESS NOTE    Laura Gentry  SWH:[id number] DOB: 1955-12-02 DOA: 11/02/2021 PCP: Seward Carol, MD     Brief Narrative:   66 y.o. WF PMHx nephrolithiasis, DM type 2 insulin-dependent, HLD, thyroid disease, migraines, brain aneurysm, lumbosacral radiculopathy   Admitted to [facility] on 10/28/2021 by way of transfer from [place] emergency department with obstructing right lithiasis after presenting from home to the latter complaining of nausea/vomiting.  Discharged on 6/10 s/pcystoscopy with bilateral stent placement on 6/7   Admitted to [facility] fall 2 days ago in the hospital when her feet slipped out from under her she landed on her right side.  She said she was walking up until that moment and then was having difficulty walking on discharge.  She remembers looking at the instructions for the antibiotics they sent her home with the side effects calling joint pain.  She has been unable to ambulate complaining of severe pain on her left hip and in her left elbow.  She has had not had trauma since discharge of the hospital.  Today the pain was so severe that family called the ambulance to transport her back here.  Complaining of pain in her left hip and left elbow with any type of movement.  No numbness or weakness.  No recent fevers or abdominal pain.   Subjective: 6/12 afebrile overnight, A/O x4.  Patient states pain is beginning to increase in her LEFT hip.    Assessment & Plan: Covid vaccination;   Active Problems:   Calculus of kidney   Ureteral obstruction, right   Severe sepsis [facility])   Acquired hypothyroidism   Left elbow fracture   S/p left hip fracture   Essential hypertension   Complicated UTI (urinary tract infection)   Anemia, unspecified  LEFT hip fracture - Patient fell late Friday night prior to early morning discharge on Saturday.  No painful to ambulate unbeknownst to patient had fracture. - Transfer to [facility] Dr.  Vanetta Mulders orthopedic surgery to take patient to the OR in the A.m.  LEFT elbow fracture - Continue arm in sling.  Most likely nonoperative - Further care per Dr. Vanetta Mulders orthopedic surgery   Obstructing right ureteral stones -6/7 s/p cystoscopy with bilateral stent placement.  See below - 6/7 patient pain not adequately controlled - 6/7 pain control             Percocet 7.5-325 mg PRN             Fentanyl 50 mcg PRN if Percocet does not alleviate pain -6/9 per urology Dr. Alexis Frock okay to transition patient to Ciprofloxacin 500 mg BID x10 days -6/9 Urology to arrange outpatient ureteroscopy with goal of stone free in elective setting in several weeks   Severe sepsis/Complicated UTI -Diagnosis on discharge 6/9 presenting leukocytosis, tachycardia. Lactic acid level: -Continue current antibiotic x14 days   DM type II uncontrolled with hyperglycemia - Levemir 15 units - Moderate SSI   Essential HTN - Currently BP controlled without medication   Acquired hypothyroidism - 6/7 TSH= 3.4 WNL - Patient not on any thyroid medication on admission - states takes natural hormone replacement.  Anemia unspecified - 6/12 anemia panel pending - Transfuse for hemoglobin<7 - Most likely will require iron transfusion, will await for anemia panel findings   Hypokalemia/Hyperkalemia - Potassium goal> 4 -Repeat potassium WNL    Hypomagnesmia - Magnesium goal> 2        Mobility Assessment (last 72  hours)     Mobility Assessment   No documentation.             Interdisciplinary Goals of Care Family Meeting   Date carried out: 11/03/2021  Location of the meeting:   Member's involved:   Durable Power of Tour manager:     Discussion: We discussed goals of care for Laura Gentry .    Code status:   Disposition:   Time spent for the meeting:     Zoye Chandra J, MD  11/03/2021, 10:59 AM         DVT  prophylaxis: On hold surgery today Code Status:  Family Communication: 6/11 sister at bedside for discussion plan of care all questions answered Status is: Inpatient    Dispo: The patient is from: Home              Anticipated d/c is to: SNF              Anticipated d/c date is: 3 days              Patient currently is not medically stable to d/c.      Consultants:  Dr. Vanetta Mulders orthopedic surgery   Procedures/Significant Events:  6/11 DG LEFT elbow: Suspect a very subtle fracture of the left radial neck 6/11 CT Hip LEFT W0 contrast: Acute left femoral neck fracture with impaction and minimal displacement, which courses through the lateral head neck junction and through the proximal third of the femoral neck medially. Small joint effusion.    I have personally reviewed and interpreted all radiology studies and my findings are as above.  VENTILATOR SETTINGS:    Cultures   Antimicrobials: Anti-infectives (From admission, onward)    Start     Ordered Stop   11/02/21 2145  ciprofloxacin (CIPRO) tablet 500 mg        11/02/21 1919           Devices    LINES / TUBES:      Continuous Infusions:   Objective: Vitals:   11/03/21 0530 11/03/21 0719 11/03/21 0900 11/03/21 1030  BP: 119/70 128/74 111/88 (!) 113/91  Pulse: 78 74 72 74  Resp: (!) 23 (!) '22 16 14  '$ Temp:  97.7 F (36.5 C)    TempSrc:  Oral    SpO2: 95% 96% 96% 97%   No intake or output data in the 24 hours ending 11/03/21 1059 There were no vitals filed for this visit.  Examination:  General: A/O x4 No acute respiratory distress Eyes: negative scleral hemorrhage, negative anisocoria, negative icterus ENT: Negative Runny nose, negative gingival bleeding, Neck:  Negative scars, masses, torticollis, lymphadenopathy, JVD Lungs: Clear to auscultation bilaterally without wheezes or crackles Cardiovascular: Regular rate and rhythm without murmur gallop or rub normal S1 and S2 Abdomen:  negative abdominal pain, nondistended, positive soft, bowel sounds, no rebound, no ascites, no appreciable mass Extremities: significant tenderness around her LEFT hip and increased pain with any type of range of motion, LEFT elbow and shoulder Skin: Negative rashes, lesions, ulcers Psychiatric:  Negative depression, negative anxiety, negative fatigue, negative mania  Central nervous system:  Cranial nerves II through XII intact, tongue/uvula midline, all extremities muscle strength 5/5, sensation intact throughout, negative dysarthria, negative expressive aphasia, negative receptive aphasia.  .     Data Reviewed: Care during the described time interval was provided by me .  I have reviewed this patient's available data, including medical history, events of  note, physical examination, and all test results as part of my evaluation.  CBC: Recent Labs  Lab 10/30/21 0259 10/31/21 0436 11/01/21 0519 11/02/21 1504 11/02/21 1802 11/03/21 0857  WBC 8.4 7.8 9.7 9.3  --  7.3  NEUTROABS 4.3 3.3 7.8* 6.9  --  4.3  HGB 11.7* 10.9* 10.9* 13.0 11.9* 10.8*  HCT 37.2 34.5* 35.3* 41.4 35.0* 34.2*  MCV 78.8* 78.8* 78.6* 78.0*  --  78.8*  PLT 296 261 247 316  --  322   Basic Metabolic Panel: Recent Labs  Lab 10/29/21 0254 10/29/21 1008 10/30/21 0259 10/31/21 0436 11/01/21 0519 11/02/21 1548 11/02/21 1802 11/03/21 0857  NA 139   < > 143 139 141 137 136 137  K 3.2*   < > 3.4* 3.8 3.2* 6.4* 4.9 3.7  CL 106   < > 110 105 107 102 100 104  CO2 23   < > '26 26 26 26  '$ --  27  GLUCOSE 153*   < > 71 186* 163* 235* 221* 164*  BUN 23   < > '11 10 9 11 10 13  '$ CREATININE 0.92   < > 0.66 0.59 0.70 0.90 0.70 0.70  CALCIUM 8.8*   < > 8.6* 8.4* 8.6* 8.6*  --  8.4*  MG 1.7  --  1.7 1.4* 1.6*  --   --  1.6*  PHOS  --   --  3.7 3.9 3.8  --   --   --    < > = values in this interval not displayed.   GFR: Estimated Creatinine Clearance: 67.3 mL/min (by C-G formula based on SCr of 0.7 mg/dL). Liver Function  Tests: Recent Labs  Lab 10/29/21 0254 10/30/21 0259 10/31/21 0436 11/01/21 0519 11/02/21 1548  AST 18 11* 13* 14* 42*  ALT '10 10 9 12 8  '$ ALKPHOS 75 66 60 68 72  BILITOT 1.6* 0.8 0.7 0.8 2.2*  PROT 6.7 6.0* 5.8* 6.2* 6.8  ALBUMIN 3.3* 2.8* 2.7* 3.0* 3.0*   Recent Labs  Lab 10/28/21 1847  LIPASE 24   No results for input(s): "AMMONIA" in the last 168 hours. Coagulation Profile: Recent Labs  Lab 10/29/21 0549 11/02/21 1504  INR 1.0 1.0   Cardiac Enzymes: No results for input(s): "CKTOTAL", "CKMB", "CKMBINDEX", "TROPONINI" in the last 168 hours. BNP (last 3 results) No results for input(s): "PROBNP" in the last 8760 hours. HbA1C: No results for input(s): "HGBA1C" in the last 72 hours. CBG: Recent Labs  Lab 11/02/21 1419 11/02/21 2212 11/03/21 0006 11/03/21 0319 11/03/21 0759  GLUCAP 252* 267* 263* 174* 158*   Lipid Profile: No results for input(s): "CHOL", "HDL", "LDLCALC", "TRIG", "CHOLHDL", "LDLDIRECT" in the last 72 hours. Thyroid Function Tests: No results for input(s): "TSH", "T4TOTAL", "FREET4", "T3FREE", "THYROIDAB" in the last 72 hours. Anemia Panel: No results for input(s): "VITAMINB12", "FOLATE", "FERRITIN", "TIBC", "IRON", "RETICCTPCT" in the last 72 hours. Sepsis Labs: Recent Labs  Lab 10/28/21 1847 10/28/21 2220  LATICACIDVEN 2.1* 1.3    Recent Results (from the past 240 hour(s))  SARS Coronavirus 2 by RT PCR (hospital order, performed in [facility] hospital lab) *cepheid single result test* Anterior Nasal Swab     Status: None   Collection Time: 10/28/21  6:55 PM   Specimen: Anterior Nasal Swab  Result Value Ref Range Status   SARS Coronavirus 2 by RT PCR NEGATIVE NEGATIVE Final    Comment: (NOTE) SARS-CoV-2 target nucleic acids are NOT DETECTED.  The SARS-CoV-2 RNA is generally detectable in  upper and lower respiratory specimens during the acute phase of infection. The lowest concentration of SARS-CoV-2 viral copies this assay can  detect is 250 copies / mL. A negative result does not preclude SARS-CoV-2 infection and should not be used as the sole basis for treatment or other patient management decisions.  A negative result may occur with improper specimen collection / handling, submission of specimen other than nasopharyngeal swab, presence of viral mutation(s) within the areas targeted by this assay, and inadequate number of viral copies (<250 copies / mL). A negative result must be combined with clinical observations, patient history, and epidemiological information.  Fact Sheet for Patients:   https://www.patel.info/  Fact Sheet for Healthcare Providers: https://hall.com/  This test is not yet approved or  cleared by the [place] FDA and has been authorized for detection and/or diagnosis of SARS-CoV-2 by FDA under an Emergency Use Authorization (EUA).  This EUA will remain in effect (meaning this test can be used) for the duration of the COVID-19 declaration under Section 564(b)(1) of the Act, 21 U.S.C. section 360bbb-3(b)(1), unless the authorization is terminated or revoked sooner.  Performed at [facility], [address], [place], [place] [address]   Urine Culture     Status: Abnormal   Collection Time: 10/28/21  8:45 PM   Specimen: Urine, Clean Catch  Result Value Ref Range Status   Specimen Description   Final    URINE, CLEAN CATCH Performed at [facility], [address] [place], [place] [address]    Special Requests   Final    NONE Performed at [facility], [address] [place], [place] [address]    Culture MULTIPLE SPECIES PRESENT, SUGGEST RECOLLECTION (A)  Final   Report Status 10/29/2021 FINAL  Final  MRSA Next Gen by PCR, Nasal     Status: None   Collection Time: 10/29/21  2:04 AM   Specimen: Nasal Mucosa; Nasal Swab  Result Value Ref Range Status   MRSA by PCR Next Gen NOT DETECTED NOT DETECTED  Final    Comment: (NOTE) The GeneXpert MRSA Assay (FDA approved for NASAL specimens only), is one component of a comprehensive MRSA colonization surveillance program. It is not intended to diagnose MRSA infection nor to guide or monitor treatment for MRSA infections. Test performance is not FDA approved in patients less than 42 years old. Performed at [facility] [place] [facility], [place] [address], [place], [place] [address]          Radiology Studies: CT Hip Left Wo Contrast  Result Date: 11/02/2021 CLINICAL DATA:  Hip pain, stress fracture suspected, neg xray EXAM: CT OF THE LEFT HIP WITHOUT CONTRAST TECHNIQUE: Multidetector CT imaging of the left hip was performed according to the standard protocol. Multiplanar CT image reconstructions were also generated. RADIATION DOSE REDUCTION: This exam was performed according to the departmental dose-optimization program which includes automated exposure control, adjustment of the mA and/or kV according to patient size and/or use of iterative reconstruction technique. COMPARISON:  Same day radiograph FINDINGS: Bones/Joint/Cartilage There is an acute left femoral neck fracture with impaction and minimal displacement, which courses through the lateral head neck junction and through the proximal third of the femoral neck medially. Small joint effusion. Ligaments Suboptimally assessed by CT. Muscles and Tendons No acute myotendinous abnormality by CT. Soft tissues Mild adjacent soft tissue swelling. IMPRESSION: Acute left subcapital/transcervical left femoral neck fracture with impaction and minimal displacement. Electronically Signed   By: Edison Nasuti  Chancy Milroy M.D.   On: 11/02/2021 16:38   DG Chest 1 View  Result Date: 11/02/2021 CLINICAL DATA:  Hip fracture. EXAM: CHEST  1 VIEW COMPARISON:  None Available. FINDINGS: Normal mediastinum and cardiac silhouette. Normal pulmonary vasculature. No evidence of effusion, infiltrate, or pneumothorax. No acute bony  abnormality. IMPRESSION: No acute cardiopulmonary process. Electronically Signed   By: Suzy Bouchard M.D.   On: 11/02/2021 16:23   CT HEAD WO CONTRAST  Result Date: 11/02/2021 CLINICAL DATA:  Neuro deficit, acute, stroke suspected. Left sided weakness. EXAM: CT HEAD WITHOUT CONTRAST TECHNIQUE: Contiguous axial images were obtained from the base of the skull through the vertex without intravenous contrast. RADIATION DOSE REDUCTION: This exam was performed according to the departmental dose-optimization program which includes automated exposure control, adjustment of the mA and/or kV according to patient size and/or use of iterative reconstruction technique. COMPARISON:  Head CT 10/28/2021 and MRI 12/31/2019 FINDINGS: Brain: Within limitations of streak artifact from a right MCA aneurysm clip, there is no evidence of an acute infarct, intracranial hemorrhage, mass, midline shift, or extra-axial fluid collection. The ventricles are normal in size. Vascular: Prior aneurysm clipping in the right MCA bifurcation region. No hyperdense vessel. Skull: Right pterional craniotomy. Sinuses/Orbits: Paranasal sinuses and mastoid air cells are clear. Bilateral cataract extraction. Other: None. IMPRESSION: No evidence of acute intracranial abnormality. Electronically Signed   By: Logan Bores M.D.   On: 11/02/2021 15:19   DG Elbow Complete Left  Result Date: 11/02/2021 CLINICAL DATA:  Fall, pain EXAM: LEFT ELBOW - COMPLETE 3+ VIEW COMPARISON:  None Available. FINDINGS: Suspect a very subtle fracture of the left radial neck. There is no evidence of arthropathy or other focal bone abnormality. Soft tissues are unremarkable. IMPRESSION: Suspect a very subtle fracture of the left radial neck. Correlate for point tenderness. No elbow joint effusion. Electronically Signed   By: Delanna Ahmadi M.D.   On: 11/02/2021 15:16   DG Hip Unilat W or Wo Pelvis 2-3 Views Left  Result Date: 11/02/2021 CLINICAL DATA:  Fall, left hip  pain EXAM: DG HIP (WITH OR WITHOUT PELVIS) 2-3V LEFT COMPARISON:  None Available. FINDINGS: Nondisplaced fracture of the left femoral neck, which appears to be transcervical or basicervical but is not clearly assessed given internal rotation on all views provided. No displaced fracture of the bony pelvis or proximal right femur seen in single frontal view. IMPRESSION: Nondisplaced fracture of the left femoral neck, which appears to be transcervical or basicervical but is not clearly assessed given internal rotation on all views provided. Consider additional radiographic views or CT to more clearly evaluate fracture anatomy. Electronically Signed   By: Delanna Ahmadi M.D.   On: 11/02/2021 15:14        Scheduled Meds:  atorvastatin  40 mg Oral Daily   ciprofloxacin  500 mg Oral BID   DULoxetine  60 mg Oral Daily   gabapentin  900 mg Oral QHS   insulin aspart  0-15 Units Subcutaneous Q4H   insulin detemir  15 Units Subcutaneous QHS   Continuous Infusions:   LOS: 1 day    Time spent:40 min    Shacola Schussler, Geraldo Docker, MD [facility]   If 7PM-7AM, please contact night-coverage 11/03/2021, 10:59 AM

## 2021-11-03 NOTE — Progress Notes (Signed)
Per pt, she not voided since yesterday. Bladder scan showed 467ms.  Dr BSammuel Hineswas notified.  Per MD will consult urology. Pt will return back to [facility].

## 2021-11-03 NOTE — Significant Event (Signed)
Informed that patient has not had any urine output in 24 hours. Notified the medical service and will plan to consult urology with left hip fixation 6/13. Plan for NPO at midnight.

## 2021-11-03 NOTE — Progress Notes (Signed)
Patient voided twice on bedpan at 1630, no urinary catheter placed

## 2021-11-03 NOTE — ED Notes (Signed)
Pt placed on 2 L/M on nasal cannula, due to SpO2 room air reading 87-89%, pt now reading 96%

## 2021-11-03 NOTE — Interval H&P Note (Signed)
History and Physical Interval Note:  11/03/2021 3:47 PM  Laura Gentry  has presented today for surgery, with the diagnosis of left hip fracture.  The various methods of treatment have been discussed with the patient and family. After consideration of risks, benefits and other options for treatment, the patient has consented to  Procedure(s): CANNULATED HIP PINNING (Left) as a surgical intervention.  The patient's history has been reviewed, patient examined, no change in status, stable for surgery.  I have reviewed the patient's chart and labs.  Questions were answered to the patient's satisfaction.     Vanetta Mulders

## 2021-11-03 NOTE — Anesthesia Preprocedure Evaluation (Addendum)
Anesthesia Evaluation  Patient identified by MRN, date of birth, ID band Patient awake    Reviewed: Allergy & Precautions, NPO status , Patient's Chart, lab work & pertinent test results  History of Anesthesia Complications Negative for: history of anesthetic complications  Airway Mallampati: I  TM Distance: >3 FB Neck ROM: Full    Dental  (+) Edentulous Upper, Edentulous Lower, Dental Advisory Given   Pulmonary former smoker,    Pulmonary exam normal        Cardiovascular hypertension, Pt. on medications and Pt. on home beta blockers Normal cardiovascular exam     Neuro/Psych  Headaches, S/P cerebral aneurysm clipping 2013 negative psych ROS   GI/Hepatic negative GI ROS, Neg liver ROS,   Endo/Other  diabetes, Type 2, Insulin DependentHypothyroidism   Renal/GU Renal calculi, s/p stent bilateral 6/7  negative genitourinary   Musculoskeletal left hip fracture   Abdominal   Peds  Hematology  (+) Blood dyscrasia (Hgb 10.8), anemia ,   Anesthesia Other Findings   Reproductive/Obstetrics negative OB ROS                          Anesthesia Physical Anesthesia Plan  ASA: 2  Anesthesia Plan: General   Post-op Pain Management: Tylenol PO (pre-op)*   Induction:   PONV Risk Score and Plan: 3 and Treatment may vary due to age or medical condition, Midazolam, Dexamethasone and Ondansetron  Airway Management Planned: LMA  Additional Equipment: None  Intra-op Plan:   Post-operative Plan: Extubation in OR  Informed Consent: I have reviewed the patients History and Physical, chart, labs and discussed the procedure including the risks, benefits and alternatives for the proposed anesthesia with the patient or authorized representative who has indicated his/her understanding and acceptance.     Dental advisory given  Plan Discussed with: Anesthesiologist and CRNA  Anesthesia Plan Comments:  (Lab Results      Component                Value               Date                      WBC                      6.7                 11/04/2021                HGB                      11.0 (L)            11/04/2021                HCT                      34.4 (L)            11/04/2021                MCV                      77.5 (L)            11/04/2021                PLT  278                 11/04/2021           Lab Results      Component                Value               Date                      NA                       136                 11/04/2021                K                        3.8                 11/04/2021                CO2                      23                  11/04/2021                GLUCOSE                  143 (H)             11/04/2021                BUN                      13                  11/04/2021                CREATININE               0.76                11/04/2021                CALCIUM                  7.7 (L)             11/04/2021                GFRNONAA                 >60                 11/04/2021          )      Anesthesia Quick Evaluation

## 2021-11-04 ENCOUNTER — Inpatient Hospital Stay (HOSPITAL_COMMUNITY): Payer: Medicare HMO

## 2021-11-04 ENCOUNTER — Inpatient Hospital Stay (HOSPITAL_COMMUNITY): Payer: Medicare HMO | Admitting: General Practice

## 2021-11-04 ENCOUNTER — Other Ambulatory Visit: Payer: Self-pay

## 2021-11-04 ENCOUNTER — Encounter (HOSPITAL_COMMUNITY)
Admission: EM | Disposition: A | Discharge status: Home Health | Payer: Self-pay | Source: Ambulatory Visit | Attending: Internal Medicine

## 2021-11-04 ENCOUNTER — Encounter (HOSPITAL_COMMUNITY): Payer: Self-pay | Admitting: Internal Medicine

## 2021-11-04 DIAGNOSIS — E039 Hypothyroidism, unspecified: Secondary | ICD-10-CM | POA: Diagnosis not present

## 2021-11-04 DIAGNOSIS — R338 Other retention of urine: Secondary | ICD-10-CM | POA: Clinically undetermined

## 2021-11-04 DIAGNOSIS — D649 Anemia, unspecified: Secondary | ICD-10-CM | POA: Diagnosis not present

## 2021-11-04 DIAGNOSIS — S72002A Fracture of unspecified part of neck of left femur, initial encounter for closed fracture: Secondary | ICD-10-CM

## 2021-11-04 DIAGNOSIS — S42402P Unspecified fracture of lower end of left humerus, subsequent encounter for fracture with malunion: Secondary | ICD-10-CM

## 2021-11-04 DIAGNOSIS — R319 Hematuria, unspecified: Secondary | ICD-10-CM | POA: Diagnosis not present

## 2021-11-04 HISTORY — PX: HIP PINNING,CANNULATED: SHX1758

## 2021-11-04 LAB — CBC WITH DIFFERENTIAL/PLATELET
Abs Immature Granulocytes: 0.06 10*3/uL (ref 0.00–0.07)
Basophils Absolute: 0.1 10*3/uL (ref 0.0–0.1)
Basophils Relative: 1 %
Eosinophils Absolute: 0.3 10*3/uL (ref 0.0–0.5)
Eosinophils Relative: 4 %
HCT: 34.4 % — ABNORMAL LOW (ref 36.0–46.0)
Hemoglobin: 11 g/dL — ABNORMAL LOW (ref 12.0–15.0)
Immature Granulocytes: 1 %
Lymphocytes Relative: 30 %
Lymphs Abs: 2 10*3/uL (ref 0.7–4.0)
MCH: 24.8 pg — ABNORMAL LOW (ref 26.0–34.0)
MCHC: 32 g/dL (ref 30.0–36.0)
MCV: 77.5 fL — ABNORMAL LOW (ref 80.0–100.0)
Monocytes Absolute: 0.7 10*3/uL (ref 0.1–1.0)
Monocytes Relative: 11 %
Neutro Abs: 3.6 10*3/uL (ref 1.7–7.7)
Neutrophils Relative %: 53 %
Platelets: 278 10*3/uL (ref 150–400)
RBC: 4.44 MIL/uL (ref 3.87–5.11)
RDW: 14.6 % (ref 11.5–15.5)
WBC: 6.7 10*3/uL (ref 4.0–10.5)
nRBC: 0 % (ref 0.0–0.2)

## 2021-11-04 LAB — URINALYSIS, ROUTINE W REFLEX MICROSCOPIC
Bacteria, UA: NONE SEEN
Bilirubin Urine: NEGATIVE
Glucose, UA: NEGATIVE mg/dL
Ketones, ur: NEGATIVE mg/dL
Leukocytes,Ua: NEGATIVE
Nitrite: NEGATIVE
Protein, ur: 30 mg/dL — AB
RBC / HPF: >50 RBC/hpf — ABNORMAL HIGH (ref 0–5)
Specific Gravity, Urine: 1.014 (ref 1.005–1.030)
pH: 5 (ref 5.0–8.0)

## 2021-11-04 LAB — MAGNESIUM: Magnesium: 2.5 mg/dL — ABNORMAL HIGH (ref 1.7–2.4)

## 2021-11-04 LAB — GLUCOSE, CAPILLARY
Glucose-Capillary: 166 mg/dL — ABNORMAL HIGH (ref 70–99)
Glucose-Capillary: 82 mg/dL (ref 70–99)
Glucose-Capillary: 84 mg/dL (ref 70–99)
Glucose-Capillary: 88 mg/dL (ref 70–99)
Glucose-Capillary: 281 mg/dL — ABNORMAL HIGH (ref 70–99)

## 2021-11-04 LAB — BASIC METABOLIC PANEL
Anion gap: 6 (ref 5–15)
BUN: 13 mg/dL (ref 8–23)
CO2: 23 mmol/L (ref 22–32)
Calcium: 7.7 mg/dL — ABNORMAL LOW (ref 8.9–10.3)
Chloride: 107 mmol/L (ref 98–111)
Creatinine, Ser: 0.76 mg/dL (ref 0.44–1.00)
GFR, Estimated: >60 mL/min (ref >60)
Glucose, Bld: 143 mg/dL — ABNORMAL HIGH (ref 70–99)
Potassium: 3.8 mmol/L (ref 3.5–5.1)
Sodium: 136 mmol/L (ref 135–145)

## 2021-11-04 LAB — CREATININE, URINE, RANDOM: Creatinine, Urine: 75.79 mg/dL

## 2021-11-04 LAB — SODIUM, URINE, RANDOM: Sodium, Ur: 81 mmol/L

## 2021-11-04 SURGERY — FIXATION, FEMUR, NECK, PERCUTANEOUS, USING SCREW
Anesthesia: General | Site: Hip | Laterality: Left

## 2021-11-04 MED ORDER — PROPOFOL 10 MG/ML IV BOLUS
INTRAVENOUS | Status: DC | PRN
  Administered 2021-11-04: 110 mg via INTRAVENOUS

## 2021-11-04 MED ORDER — CEFAZOLIN SODIUM 1 G IJ SOLR
INTRAMUSCULAR | Status: AC
  Filled 2021-11-04: qty 20

## 2021-11-04 MED ORDER — SUCCINYLCHOLINE CHLORIDE 200 MG/10ML IV SOSY
PREFILLED_SYRINGE | INTRAVENOUS | Status: AC
  Filled 2021-11-04: qty 10

## 2021-11-04 MED ORDER — DROPERIDOL 2.5 MG/ML IJ SOLN: 0.6250 mg | Freq: Once | INTRAMUSCULAR | Status: DC | PRN

## 2021-11-04 MED ORDER — ROCURONIUM BROMIDE 10 MG/ML (PF) SYRINGE
PREFILLED_SYRINGE | INTRAVENOUS | Status: DC | PRN
  Administered 2021-11-04: 70 mg via INTRAVENOUS

## 2021-11-04 MED ORDER — OXYCODONE HCL 5 MG/5ML PO SOLN: 5.0000 mg | Freq: Once | ORAL | Status: DC | PRN

## 2021-11-04 MED ORDER — PROPOFOL 10 MG/ML IV BOLUS
INTRAVENOUS | Status: AC
  Filled 2021-11-04: qty 20

## 2021-11-04 MED ORDER — CLINDAMYCIN PHOSPHATE 900 MG/50ML IV SOLN
INTRAVENOUS | Status: DC | PRN
  Administered 2021-11-04: 900 mg via INTRAVENOUS

## 2021-11-04 MED ORDER — FENTANYL CITRATE (PF) 100 MCG/2ML IJ SOLN
25.0000 ug | INTRAMUSCULAR | Status: DC | PRN
  Administered 2021-11-04: 25 ug via INTRAVENOUS

## 2021-11-04 MED ORDER — DEXAMETHASONE SODIUM PHOSPHATE 10 MG/ML IJ SOLN
INTRAMUSCULAR | Status: DC | PRN
  Administered 2021-11-04: 5 mg via INTRAVENOUS

## 2021-11-04 MED ORDER — 0.9 % SODIUM CHLORIDE (POUR BTL) OPTIME
TOPICAL | Status: DC | PRN
  Administered 2021-11-04: 1000 mL

## 2021-11-04 MED ORDER — TRANEXAMIC ACID-NACL 1000-0.7 MG/100ML-% IV SOLN
INTRAVENOUS | Status: AC
  Filled 2021-11-04: qty 100

## 2021-11-04 MED ORDER — PHENYLEPHRINE HCL (PRESSORS) 10 MG/ML IV SOLN
INTRAVENOUS | Status: AC
  Filled 2021-11-04: qty 1

## 2021-11-04 MED ORDER — CHLORHEXIDINE GLUCONATE CLOTH 2 % EX PADS
6.0000 | MEDICATED_PAD | Freq: Every day | CUTANEOUS | Status: DC
  Administered 2021-11-04 – 2021-11-06 (×3): 6 via TOPICAL

## 2021-11-04 MED ORDER — FENTANYL CITRATE (PF) 100 MCG/2ML IJ SOLN
INTRAMUSCULAR | Status: AC
  Filled 2021-11-04: qty 2

## 2021-11-04 MED ORDER — PHENYLEPHRINE HCL (PRESSORS) 10 MG/ML IV SOLN
INTRAVENOUS | Status: AC
Start: 2021-11-04 — End: ?
  Filled 2021-11-04: qty 1

## 2021-11-04 MED ORDER — ROCURONIUM BROMIDE 10 MG/ML (PF) SYRINGE
PREFILLED_SYRINGE | INTRAVENOUS | Status: AC
Start: 2021-11-04 — End: ?
  Filled 2021-11-04: qty 10

## 2021-11-04 MED ORDER — OXYCODONE HCL 5 MG PO TABS: 5.0000 mg | ORAL_TABLET | Freq: Once | ORAL | Status: DC | PRN

## 2021-11-04 MED ORDER — LACTATED RINGERS IV SOLN: INTRAVENOUS | Status: DC

## 2021-11-04 MED ORDER — ONDANSETRON HCL 4 MG/2ML IJ SOLN
INTRAMUSCULAR | Status: AC
  Filled 2021-11-04: qty 2

## 2021-11-04 MED ORDER — FENTANYL CITRATE (PF) 250 MCG/5ML IJ SOLN
INTRAMUSCULAR | Status: AC
  Filled 2021-11-04: qty 5

## 2021-11-04 MED ORDER — PHENYLEPHRINE 80 MCG/ML (10ML) SYRINGE FOR IV PUSH (FOR BLOOD PRESSURE SUPPORT)
PREFILLED_SYRINGE | INTRAVENOUS | Status: DC | PRN
  Administered 2021-11-04 (×2): 160 ug via INTRAVENOUS

## 2021-11-04 MED ORDER — ENOXAPARIN SODIUM 40 MG/0.4ML IJ SOSY
40.0000 mg | PREFILLED_SYRINGE | INTRAMUSCULAR | Status: DC
Start: 2021-11-05 — End: 2021-11-05
  Administered 2021-11-05: 40 mg via SUBCUTANEOUS
  Filled 2021-11-04 (×2): qty 0.4

## 2021-11-04 MED ORDER — ASPIRIN 325 MG PO TABS
325.0000 mg | ORAL_TABLET | Freq: Every day | ORAL | Status: DC
  Administered 2021-11-05 – 2021-11-07 (×3): 325 mg via ORAL
  Filled 2021-11-04 (×4): qty 1

## 2021-11-04 MED ORDER — MIDAZOLAM HCL 2 MG/2ML IJ SOLN
INTRAMUSCULAR | Status: AC
  Filled 2021-11-04: qty 2

## 2021-11-04 MED ORDER — PHENYLEPHRINE 80 MCG/ML (10ML) SYRINGE FOR IV PUSH (FOR BLOOD PRESSURE SUPPORT)
PREFILLED_SYRINGE | INTRAVENOUS | Status: AC
  Filled 2021-11-04: qty 10

## 2021-11-04 MED ORDER — SUGAMMADEX SODIUM 200 MG/2ML IV SOLN
INTRAVENOUS | Status: DC | PRN
  Administered 2021-11-04: 200 mg via INTRAVENOUS

## 2021-11-04 MED ORDER — EPHEDRINE 5 MG/ML INJ
INTRAVENOUS | Status: AC
  Filled 2021-11-04: qty 5

## 2021-11-04 MED ORDER — CLINDAMYCIN PHOSPHATE 900 MG/50ML IV SOLN
INTRAVENOUS | Status: AC
  Filled 2021-11-04: qty 50

## 2021-11-04 MED ORDER — MIDAZOLAM HCL 2 MG/2ML IJ SOLN
INTRAMUSCULAR | Status: DC | PRN
  Administered 2021-11-04: 2 mg via INTRAVENOUS

## 2021-11-04 MED ORDER — DEXAMETHASONE SODIUM PHOSPHATE 10 MG/ML IJ SOLN
INTRAMUSCULAR | Status: AC
  Filled 2021-11-04: qty 1

## 2021-11-04 MED ORDER — FENTANYL CITRATE (PF) 250 MCG/5ML IJ SOLN
INTRAMUSCULAR | Status: DC | PRN
  Administered 2021-11-04: 100 ug via INTRAVENOUS
  Administered 2021-11-04: 50 ug via INTRAVENOUS

## 2021-11-04 MED ORDER — LIDOCAINE 2% (20 MG/ML) 5 ML SYRINGE
INTRAMUSCULAR | Status: DC | PRN
  Administered 2021-11-04: 100 mg via INTRAVENOUS

## 2021-11-04 MED ORDER — CHLORHEXIDINE GLUCONATE 0.12 % MT SOLN
15.0000 mL | OROMUCOSAL | Status: AC
  Administered 2021-11-04: 15 mL via OROMUCOSAL
  Filled 2021-11-04 (×2): qty 15

## 2021-11-04 MED ORDER — ONDANSETRON HCL 4 MG/2ML IJ SOLN
INTRAMUSCULAR | Status: DC | PRN
  Administered 2021-11-04: 4 mg via INTRAVENOUS

## 2021-11-04 SURGICAL SUPPLY — 37 items
BAG COUNTER SPONGE SURGICOUNT (BAG) ×3 IMPLANT
BAG SPNG CNTER NS LX DISP (BAG) ×1
BIT DRILL CANNULATED 5.0 (BIT) ×1 IMPLANT
COVER PERINEAL POST (MISCELLANEOUS) ×3 IMPLANT
COVER SURGICAL LIGHT HANDLE (MISCELLANEOUS) ×6 IMPLANT
DRAPE STERI IOBAN 125X83 (DRAPES) ×3 IMPLANT
DRSG ADAPTIC 3X8 NADH LF (GAUZE/BANDAGES/DRESSINGS) ×2 IMPLANT
DRSG AQUACEL AG ADV 3.5X 6 (GAUZE/BANDAGES/DRESSINGS) ×1 IMPLANT
DRSG MEPILEX BORDER 4X4 (GAUZE/BANDAGES/DRESSINGS) ×2 IMPLANT
DURAPREP 26ML APPLICATOR (WOUND CARE) ×3 IMPLANT
ELECT REM PT RETURN 9FT ADLT (ELECTROSURGICAL)
ELECTRODE REM PT RTRN 9FT ADLT (ELECTROSURGICAL) IMPLANT
GLOVE BIO SURGEON STRL SZ 6.5 (GLOVE) ×1 IMPLANT
GLOVE BIOGEL PI IND STRL 6.5 (GLOVE) IMPLANT
GLOVE BIOGEL PI IND STRL 9 (GLOVE) ×2 IMPLANT
GLOVE BIOGEL PI INDICATOR 6.5 (GLOVE) ×1
GLOVE BIOGEL PI INDICATOR 9 (GLOVE) ×1
GLOVE SURG ORTHO 9.0 STRL STRW (GLOVE) ×3 IMPLANT
GOWN STRL REUS W/ TWL XL LVL3 (GOWN DISPOSABLE) ×6 IMPLANT
GOWN STRL REUS W/TWL XL LVL3 (GOWN DISPOSABLE) ×4
GUIDE PIN 3.2MM (PIN) ×6
GUIDE PIN ORTH 12X3.2X (PIN) IMPLANT
KIT BASIN OR (CUSTOM PROCEDURE TRAY) ×3 IMPLANT
KIT TURNOVER KIT B (KITS) ×3 IMPLANT
MANIFOLD NEPTUNE II (INSTRUMENTS) ×3 IMPLANT
NS IRRIG 1000ML POUR BTL (IV SOLUTION) ×3 IMPLANT
PACK GENERAL/GYN (CUSTOM PROCEDURE TRAY) ×3 IMPLANT
PAD ARMBOARD 7.5X6 YLW CONV (MISCELLANEOUS) ×6 IMPLANT
SCREW CANCELLOUS 7.0X75MM (Screw) ×1 IMPLANT
SCREW CANN 7.0X85MM (Screw) ×4 IMPLANT
SCREW CANN RVRS CUT FLT 85X16X (Screw) IMPLANT
SCREW CANNULATED 7.0X80 (Screw) ×1 IMPLANT
STAPLER VISISTAT 35W (STAPLE) ×3 IMPLANT
SUT VIC AB 2-0 CTB1 (SUTURE) ×3 IMPLANT
TOWEL GREEN STERILE (TOWEL DISPOSABLE) ×3 IMPLANT
TOWEL GREEN STERILE FF (TOWEL DISPOSABLE) ×3 IMPLANT
WATER STERILE IRR 1000ML POUR (IV SOLUTION) ×3 IMPLANT

## 2021-11-04 NOTE — Discharge Instructions (Signed)
     Discharge Instructions    Attending Surgeon: Vanetta Mulders, MD Office Phone Number: [phone]   Diagnosis and Procedures:    Surgeries Performed: Left hip percutaneous screws  Discharge Plan:    Diet: Resume usual diet. Begin with light or bland foods.  Drink plenty of fluids.  Activity:  You may put weight as tolerated on your left leg. You are advised to go home directly from the hospital or surgical center. Restrict your activities.  GENERAL INSTRUCTIONS: 1.  Please apply ice to your wound to help with swelling and inflammation. This will improve your comfort and your overall recovery following surgery.     2. Please call Dr. Eddie Dibbles office at [phone] with questions Monday-Friday during business hours. If no one answers, please leave a message and someone should get back to the patient within 24 hours. For emergencies please call 911 or proceed to the emergency room.   3. Patient to notify surgical team if experiences any of the following: Bowel/Bladder dysfunction, uncontrolled pain, nerve/muscle weakness, incision with increased drainage or redness, nausea/vomiting and Fever greater than 101.0 F.  Be alert for signs of infection including redness, streaking, odor, fever or chills. Be alert for excessive pain or bleeding and notify your surgeon immediately.  WOUND INSTRUCTIONS:   Leave your dressing, cast, or splint in place until your post operative visit.  Keep it clean and dry.  Always keep the incision clean and dry until the staples/sutures are removed. If there is no drainage from the incision you should keep it open to air. If there is drainage from the incision you must keep it covered at all times until the drainage stops  Do not soak in a bath tub, hot tub, pool, lake or other body of water until 21 days after your surgery and your incision is completely dry and healed.  If you have removable sutures (or staples) they must be removed 10-14 days  (unless otherwise instructed) from the day of your surgery.     1)  Elevate the extremity as much as possible.  2)  Keep the dressing clean and dry.  3)  Please call us if the dressing becomes wet or dirty.  4)  If you are experiencing worsening pain or worsening swelling, please call.

## 2021-11-04 NOTE — Interval H&P Note (Signed)
History and Physical Interval Note:  11/04/2021 2:11 PM  Laura Gentry  has presented today for surgery, with the diagnosis of left hip fracture.  The various methods of treatment have been discussed with the patient and family. After consideration of risks, benefits and other options for treatment, the patient has consented to  Procedure(s): CANNULATED HIP PINNING (Left) as a surgical intervention.  The patient's history has been reviewed, patient examined, no change in status, stable for surgery.  I have reviewed the patient's chart and labs.  Questions were answered to the patient's satisfaction.     Vanetta Mulders

## 2021-11-04 NOTE — Brief Op Note (Signed)
   Brief Op Note  Date of Surgery: 11/04/2021  Preoperative Diagnosis: left hip fracture  Postoperative Diagnosis: same  Procedure: Procedure(s): CANNULATED HIP PINNING  Implants: Implant Name Type Inv. Item Serial No. Manufacturer Lot No. LRB No. Used Action  SCREW CANN 7.0X85MM - [id number] Screw SCREW CANN 7.0X85MM  [employer](ORTH,TRAU,BIO,SG)  Left 2 Implanted  SCREW CANCELLOUS 7.0X75MM - [id number] Screw SCREW CANCELLOUS 7.0X75MM  [employer](ORTH,TRAU,BIO,SG)  Left 1 Implanted  SCREW CANNULATED 7.0X80 - [id number] Screw SCREW CANNULATED 7.0X80  [employer](ORTH,TRAU,BIO,SG)  Left 1 Implanted    Surgeons: Surgeon(s): Vanetta Mulders, MD  Anesthesia: General    Estimated Blood Loss: See anesthesia record  Complications: None  Condition to PACU: Stable  Yevonne Pax, MD 11/04/2021 4:26 PM

## 2021-11-04 NOTE — Op Note (Signed)
   Date of Surgery: 11/04/2021  INDICATIONS: Laura Gentry is a 66 y.o.-year-old female with left hip impacted femoral neck fracture. After the discussion of the risk and benefits of surgery she elected for operative repair.  The risk and benefits of the procedure were discussed in detail and documented in the pre-operative evaluation.   PREOPERATIVE DIAGNOSIS: 1. Left hip impacted femoral neck fracture  POSTOPERATIVE DIAGNOSIS: Same.  PROCEDURE: 1. Left hip cannulated screw placement  SURGEON: Yevonne Pax MD  ASSISTANT: Raynelle Fanning, ATC  ANESTHESIA:  general  IV FLUIDS AND URINE: See anesthesia record.  ANTIBIOTICS: Ancef  ESTIMATED BLOOD LOSS: 20 mL.  IMPLANTS:  Implant Name Type Inv. Item Serial No. Manufacturer Lot No. LRB No. Used Action  SCREW CANN 7.0X85MM - [id number] Screw SCREW CANN 7.0X85MM  [employer](ORTH,TRAU,BIO,SG)  Left 2 Implanted  SCREW CANCELLOUS 7.0X75MM - [id number] Screw SCREW CANCELLOUS 7.0X75MM  [employer](ORTH,TRAU,BIO,SG)  Left 1 Implanted  SCREW CANNULATED 7.0X80 - [id number] Screw SCREW CANNULATED 7.0X80  [employer](ORTH,TRAU,BIO,SG)  Left 1 Implanted    DRAINS: None  CULTURES: None  COMPLICATIONS: none  DESCRIPTION OF PROCEDURE:  The patient was identified in the preoperative holding area.  The correct site was marked according universal protocol with nursing.  She was subsequently taken back to the operating room.  Anesthesia was induced.  The patient was prepped and draped in usual sterile fashion.  Again final timeout was performed.  A guidewire was used with fluoroscopy in order to localize the skin incision about the lateral aspect of the trochanter.  10 blade was used to incise through skin and IT band.  A Cobb was used to elevate the vastus lateralis off the bone.  A wire was then introduced and this was placed in the inferior central position under direct AP and lateral fluoroscopy.  An additional anterior superior and posterior superior  wire were then placed again under direct visualization.  Once we are happy with the placement of the wires, the drill was used to drill the lateral cortex.  A measuring guide was used off the lateral cortex to determine screw sizes.  The appropriately sized screw as noted above was placed in the correct positions.  These all had excellent purchase.  Guidewires were removed and dynamic fluoroscopy was used to confirm all screws to be in the bone.  The wound was thoroughly irrigated and closed in layers of 0 Vicryl 2-0 Vicryl and staples.  Aquacel dressing was applied.  All counts were correct at the end of the case.  She was awoken taken to the PACU without complication      POSTOPERATIVE PLAN: She will be weight bearing as tolerated on the left leg. She will be see by physical therapy while in the hospital. I will plan to see her back in my office in 2 weeks for staple removal and wound check.  Yevonne Pax, MD 4:26 PM

## 2021-11-04 NOTE — Progress Notes (Addendum)
Patient retaining urine throughout night, performed an In and Out on patient got 549m out of the bladder at [facility]

## 2021-11-04 NOTE — H&P (Addendum)
Patient with urinary retention and related hydronephrosis.  Suspect this is related to the pain she's having from her hip fracture.  She will need a catheter for a week to allow her bladder to recover.  She can have the catheter removed at a rehab facility or we can schedule her to come to the urology office as an outpatient if she's discharged home.  I have asked the nurses to place an indwelling foley.

## 2021-11-04 NOTE — Transfer of Care (Signed)
Immediate Anesthesia Transfer of Care Note  Patient: Laura Gentry  Procedure(s) Performed: CANNULATED HIP PINNING (Left: Hip)  Patient Location: PACU  Anesthesia Type:General  Level of Consciousness: drowsy and patient cooperative  Airway & Oxygen Therapy: Patient Spontanous Breathing and Patient connected to nasal cannula oxygen  Post-op Assessment: Report given to RN and Post -op Vital signs reviewed and stable  Post vital signs: Reviewed and stable  Last Vitals:  Vitals Value Taken Time  BP 146/83 11/04/21 1645  Temp    Pulse 88 11/04/21 1647  Resp 16 11/04/21 1647  SpO2 91 % 11/04/21 1647  Vitals shown include unvalidated device data.  Last Pain:  Vitals:   11/04/21 1418  TempSrc: Oral  PainSc:       Patients Stated Pain Goal: 2 (87/86/76 7209)  Complications: No notable events documented.

## 2021-11-04 NOTE — Plan of Care (Signed)

## 2021-11-04 NOTE — Progress Notes (Signed)
   Subjective:  Patient reports pain as mild this AM. NPO for OR today. Voided twice once purewick was removed.  Objective:   VITALS:   Vitals:   11/03/21 1303 11/03/21 1537 11/03/21 2039 11/04/21 0443  BP: 107/73 126/75 (!) 113/57 118/67  Pulse: 81 73 96 85  Resp:  '16 18 14  '$ Temp: 97.7 F (36.5 C) (!) 97.5 F (36.4 C) 97.8 F (36.6 C)   TempSrc: Oral Oral Oral   SpO2: 96% 93% 95% 90%   Limb lengths are equal.  There is groin tenderness.  Pain with gentle logroll.  Distal neurosensory exam is intact.  Fires EHL as well as tibialis anterior and gastrocsoleus.  2+ dorsalis pedis pulse    Lab Results  Component Value Date   WBC 6.7 11/04/2021   HGB 11.0 (L) 11/04/2021   HCT 34.4 (L) 11/04/2021   MCV 77.5 (L) 11/04/2021   PLT 278 11/04/2021     Assessment/Plan: 66 year old female with a left impacted femoral neck fracture after a fall while in the hospital.  I did discuss risks and benefits of treatment of this hip fracture.  She is currently having a very difficult time ambulating and mobilizing.  That effect I did discuss that screw fixation would allow for more stability while we work to mobilize her.  I did discuss the nonzero risk of fracture displacement in the absence of surgical fixation.  She understands this and would like to proceed with surgical screw fixation in order to optimize her long-term outcome and early mobilization.  We did discuss the risk that, specifically with percutaneous screw fixation including avascular necrosis and need for repeat surgery.  She understands this and would like to proceed   Plan: Plan for left hip cannulated screw placement  Laura Gentry 11/04/2021, 7:37 AM

## 2021-11-04 NOTE — H&P (View-Only) (Signed)
   Subjective:  Patient reports pain as mild this AM. NPO for OR today. Voided twice once purewick was removed.  Objective:   VITALS:   Vitals:   11/03/21 1303 11/03/21 1537 11/03/21 2039 11/04/21 0443  BP: 107/73 126/75 (!) 113/57 118/67  Pulse: 81 73 96 85  Resp:  '16 18 14  '$ Temp: 97.7 F (36.5 C) (!) 97.5 F (36.4 C) 97.8 F (36.6 C)   TempSrc: Oral Oral Oral   SpO2: 96% 93% 95% 90%   Limb lengths are equal.  There is groin tenderness.  Pain with gentle logroll.  Distal neurosensory exam is intact.  Fires EHL as well as tibialis anterior and gastrocsoleus.  2+ dorsalis pedis pulse    Lab Results  Component Value Date   WBC 6.7 11/04/2021   HGB 11.0 (L) 11/04/2021   HCT 34.4 (L) 11/04/2021   MCV 77.5 (L) 11/04/2021   PLT 278 11/04/2021     Assessment/Plan: 66 year old female with a left impacted femoral neck fracture after a fall while in the hospital.  I did discuss risks and benefits of treatment of this hip fracture.  She is currently having a very difficult time ambulating and mobilizing.  That effect I did discuss that screw fixation would allow for more stability while we work to mobilize her.  I did discuss the nonzero risk of fracture displacement in the absence of surgical fixation.  She understands this and would like to proceed with surgical screw fixation in order to optimize her long-term outcome and early mobilization.  We did discuss the risk that, specifically with percutaneous screw fixation including avascular necrosis and need for repeat surgery.  She understands this and would like to proceed   Plan: Plan for left hip cannulated screw placement  Laura Gentry 11/04/2021, 7:37 AM

## 2021-11-04 NOTE — Anesthesia Procedure Notes (Signed)
Procedure Name: Intubation Date/Time: 11/04/2021 3:52 PM  Performed by: Janace Litten, CRNAPre-anesthesia Checklist: Patient identified, Emergency Drugs available, Suction available and Patient being monitored Patient Re-evaluated:Patient Re-evaluated prior to induction Oxygen Delivery Method: Circle System Utilized Preoxygenation: Pre-oxygenation with 100% oxygen Induction Type: IV induction Ventilation: Mask ventilation without difficulty Laryngoscope Size: Mac and 3 Grade View: Grade I Tube type: Oral Tube size: 7.0 mm Number of attempts: 1 Airway Equipment and Method: Stylet Placement Confirmation: ETT inserted through vocal cords under direct vision, positive ETCO2 and breath sounds checked- equal and bilateral Secured at: 21 cm Tube secured with: Tape Dental Injury: Teeth and Oropharynx as per pre-operative assessment

## 2021-11-04 NOTE — Plan of Care (Signed)

## 2021-11-04 NOTE — TOC CAGE-AID Note (Signed)
Transition of Care [facility]) - CAGE-AID Screening   Patient Details  Name: Laura Gentry MRN: [id number] Date of Birth: [birth year]  [facility]) CM/SW Contact:    Gaetano Hawthorne Tarpley-Carter, [place] Phone Number: 11/04/2021, 1:00 PM   Clinical Narrative: Pt participated in [place].  Pt stated she does not use substance or ETOH.  Pt was not offered resources, due to no usage of substance or ETOH.     Signe Tackitt Tarpley-Carter, MSW, LCSW-A Pronouns:  She/Her/Hers [facility] HealthTransitions of Care Clinical Social Worker Direct Number:  [phone] [email or web address]  CAGE-AID Screening:    Have You Ever Felt You Ought to Cut Down on Your Drinking or Drug Use?: No Have People Annoyed You By [employer] Your Drinking Or Drug Use?: No Have You Felt Bad Or Guilty About Your Drinking Or Drug Use?: No Have You Ever Had a Drink or Used Drugs First Thing In The Morning to Steady Your Nerves or to Get Rid of a Hangover?: No CAGE-AID Score: 0  Substance Abuse Education Offered: No

## 2021-11-04 NOTE — Progress Notes (Signed)
PROGRESS NOTE    Laura Gentry  UJW:[id number] DOB: 04-23-1956 DOA: 11/02/2021 PCP: Seward Carol, MD    Chief Complaint  Patient presents with   Hip Pain    Brief Narrative:  66 y.o. WF PMHx nephrolithiasis, DM type 2 insulin-dependent, HLD, thyroid disease, migraines, brain aneurysm, lumbosacral radiculopathy   Admitted to [facility] on 10/28/2021 by way of transfer from [place] emergency department with obstructing right lithiasis after presenting from home to the latter complaining of nausea/vomiting.  Discharged on 6/10 s/pcystoscopy with bilateral stent placement on 6/7   Admitted to [facility] 2 days ago in the hospital when her feet slipped out from under her she landed on her right side.  She said she was walking up until that moment and then was having difficulty walking on discharge.  She remembers looking at the instructions for the antibiotics they sent her home with the side effects calling joint pain.  She has been unable to ambulate complaining of severe pain on her left hip and in her left elbow.  She has had not had trauma since discharge of the hospital.  Today the pain was so severe that family called the ambulance to transport her back here.  Complaining of pain in her left hip and left elbow with any type of movement.  No numbness or weakness.  No recent fevers or abdominal pain. Patient seen by orthopedics transferred to [facility] for surgical repair of hip fracture.   Assessment & Plan:   Active Problems:   Calculus of kidney   Ureteral obstruction, right   Severe sepsis (HCC)   Acquired hypothyroidism   Left elbow fracture   S/p left hip fracture   Essential hypertension   Complicated UTI (urinary tract infection)   Anemia, unspecified   Acute urinary retention   Hematuria  #1 left impacted femoral neck fracture -Sustained after a fall during last hospitalization. -Plain films of the left hip and pelvis, CT  left hip consistent with acute left subcapital/transcervical left femoral neck fracture with impaction and minimal displacement, -Patient seen by orthopedics and patient to the OR for surgical repair today. -Pain management per orthopedics.  2.  Acute urinary retention/hematuria -Patient noted to have decreased urine output on 11/03/2021. -Acute urinary retention felt likely secondary to pain from hip fracture in the setting of poor mobility. -Renal ultrasound done with a left-sided hydronephrosis. -Case discussed with urology who are recommending placement of Foley catheter for a week to allow bladder to recover and outpatient follow-up with urology for voiding trial or catheter may be removed at the rehab in the week. -Foley catheter placed this morning 11/04/2021 and hematuria noted in Foley bag. -Hematuria likely secondary to traumatic Foley catheter placement. -Irrigate Foley catheter. -Follow-up.  3.  Left elbow fracture -Secondary to mechanical fall. -Currently in sling. -Likely nonoperative management. -Per orthopedics.  4.  History of recent right obstructing ureteral stones -Status post cystoscopy with bilateral stent placement 10/29/2021. -Patient noted to have been placed on Percocet for pain control and fentanyl as needed. -6/9 per urology, Dr. Bess Harvest patient okay to transition to ciprofloxacin 500 twice daily to complete a 10-day course of treatment. -6/9 urology to arrange outpatient ureteroscopy with goal of stone free and elective setting in several weeks.  5.  Severe sepsis/complicated UTI -Noted during last hospitalization as patient noted with a leukocytosis, tachycardia, elevated lactic acid level. -Continue antibiotics to complete a 14-day course.  6.  Diabetes mellitus type  2 with hyperglycemia -Hemoglobin A1c 8.8 (10/29/2021). -CBG noted at 166 this morning. -Patient currently n.p.o. in anticipation of surgical repair this afternoon. -Continue Levemir, SSI,  Neurontin.  7.  Hypertension -Currently controlled not on any antihypertensive medications at this time.  8.  Hypothyroidism 6/7 TSH 3.4 within normal limits -Patient not on any thyroid medication prior to admission. -Per prior physician patient noted to have stated she takes natural hormone replacement.  9.  Anemia -Hemoglobin stable at 11.0. -Follow H&H. Transfusion threshold hemoglobin < 7.  10.  Hypokalemia/hyperkalemia/hypomagnesemia -Potassium noted at 3.2 and subsequently at 6.4 on 11/02/2021. -Potassium currently at 3.8. -Magnesium noted at 1.6 magnesium repleted and currently at 2.5.    DVT prophylaxis: DVT prophylaxis per orthopedics postop. Code Status: Full Family Communication: Updated patient and sister at bedside. Disposition: TBD  Status is: Inpatient Remains inpatient appropriate because: Severity of illness   Consultants:  Orthopedics: Dr.Bokshan 11/03/2021 [facility].  Procedures:  CT left hip 11/02/2021 CT head 11/02/2021 Chest x-ray 11/02/2021 Renal ultrasound 11/03/2021 Plain films of the left elbow 11/02/2021 Plain films of the left hip and pelvis 11/02/2021  Antimicrobials:  Ciprofloxacin 11/02/2021>>>>   Subjective: Sitting up in bed.  Sister at bedside.  No chest pain.  No shortness of breath.  No abdominal pain.  Foley catheter noted with hematuria.  Complaining of left hip pain inability to bear weight on the left lower extremity.  States pain medication currently managing pain.  Objective: Vitals:   11/03/21 1537 11/03/21 2039 11/04/21 0443 11/04/21 0759  BP: 126/75 (!) 113/57 118/67 97/78  Pulse: 73 96 85   Resp: '16 18 14   '$ Temp: (!) 97.5 F (36.4 C) 97.8 F (36.6 C)    TempSrc: Oral Oral    SpO2: 93% 95% 90%     Intake/Output Summary (Last 24 hours) at 11/04/2021 1056 Last data filed at 11/04/2021 0745 Gross per 24 hour  Intake --  Output 500 ml  Net -500 ml   There were no vitals filed for this  visit.  Examination:  General exam: Appears calm and comfortable  Respiratory system: Clear to auscultation. Respiratory effort normal. Cardiovascular system: S1 & S2 heard, RRR. No JVD, murmurs, rubs, gallops or clicks. No pedal edema. Gastrointestinal system: Abdomen is nondistended, soft and nontender. No organomegaly or masses felt. Normal bowel sounds heard. Central nervous system: Alert and oriented. No focal neurological deficits. Extremities: Left upper extremity in sling. Skin: No rashes, lesions or ulcers Psychiatry: Judgement and insight appear normal. Mood & affect appropriate.     Data Reviewed: I have personally reviewed following labs and imaging studies  CBC: Recent Labs  Lab 10/31/21 0436 11/01/21 0519 11/02/21 1504 11/02/21 1802 11/03/21 0857 11/04/21 0202  WBC 7.8 9.7 9.3  --  7.3 6.7  NEUTROABS 3.3 7.8* 6.9  --  4.3 3.6  HGB 10.9* 10.9* 13.0 11.9* 10.8* 11.0*  HCT 34.5* 35.3* 41.4 35.0* 34.2* 34.4*  MCV 78.8* 78.6* 78.0*  --  78.8* 77.5*  PLT 261 247 316  --  285 518    Basic Metabolic Panel: Recent Labs  Lab 10/30/21 0259 10/31/21 0436 11/01/21 0519 11/02/21 1548 11/02/21 1802 11/03/21 0857 11/03/21 1707 11/04/21 0202  NA 143 139 141 137 136 137 137 136  K 3.4* 3.8 3.2* 6.4* 4.9 3.7 4.0 3.8  CL 110 105 107 102 100 104 103 107  CO2 '26 26 26 26  '$ --  '27 26 23  '$ GLUCOSE 71 186* 163* 235* 221* 164* 90 143*  BUN '11 10 9 11 10 13 14 13  '$ CREATININE 0.66 0.59 0.70 0.90 0.70 0.70 0.80 0.76  CALCIUM 8.6* 8.4* 8.6* 8.6*  --  8.4* 9.0 7.7*  MG 1.7 1.4* 1.6*  --   --  1.6*  --  2.5*  PHOS 3.7 3.9 3.8  --   --   --   --   --     GFR: Estimated Creatinine Clearance: 67.3 mL/min (by C-G formula based on SCr of 0.76 mg/dL).  Liver Function Tests: Recent Labs  Lab 10/29/21 0254 10/30/21 0259 10/31/21 0436 11/01/21 0519 11/02/21 1548  AST 18 11* 13* 14* 42*  ALT '10 10 9 12 8  '$ ALKPHOS 75 66 60 68 72  BILITOT 1.6* 0.8 0.7 0.8 2.2*  PROT 6.7 6.0*  5.8* 6.2* 6.8  ALBUMIN 3.3* 2.8* 2.7* 3.0* 3.0*    CBG: Recent Labs  Lab 11/03/21 1607 11/03/21 1703 11/03/21 2118 11/04/21 0341 11/04/21 0758  GLUCAP 124* 103* 186* 166* 82     Recent Results (from the past 240 hour(s))  SARS Coronavirus 2 by RT PCR (hospital order, performed in [facility] hospital lab) *cepheid single result test* Anterior Nasal Swab     Status: None   Collection Time: 10/28/21  6:55 PM   Specimen: Anterior Nasal Swab  Result Value Ref Range Status   SARS Coronavirus 2 by RT PCR NEGATIVE NEGATIVE Final    Comment: (NOTE) SARS-CoV-2 target nucleic acids are NOT DETECTED.  The SARS-CoV-2 RNA is generally detectable in upper and lower respiratory specimens during the acute phase of infection. The lowest concentration of SARS-CoV-2 viral copies this assay can detect is 250 copies / mL. A negative result does not preclude SARS-CoV-2 infection and should not be used as the sole basis for treatment or other patient management decisions.  A negative result may occur with improper specimen collection / handling, submission of specimen other than nasopharyngeal swab, presence of viral mutation(s) within the areas targeted by this assay, and inadequate number of viral copies (<250 copies / mL). A negative result must be combined with clinical observations, patient history, and epidemiological information.  Fact Sheet for Patients:   https://www.patel.info/  Fact Sheet for Healthcare Providers: https://hall.com/  This test is not yet approved or  cleared by the [place] FDA and has been authorized for detection and/or diagnosis of SARS-CoV-2 by FDA under an Emergency Use Authorization (EUA).  This EUA will remain in effect (meaning this test can be used) for the duration of the COVID-19 declaration under Section 564(b)(1) of the Act, 21 U.S.C. section 360bbb-3(b)(1), unless the authorization is terminated  or revoked sooner.  Performed at [facility], [address] [place], [place] [address]   Urine Culture     Status: Abnormal   Collection Time: 10/28/21  8:45 PM   Specimen: Urine, Clean Catch  Result Value Ref Range Status   Specimen Description   Final    URINE, CLEAN CATCH Performed at [facility], [address] [place], [place] [address]    Special Requests   Final    NONE Performed at [facility], [address] [place], [place] [address]    Culture MULTIPLE SPECIES PRESENT, SUGGEST RECOLLECTION (A)  Final   Report Status 10/29/2021 FINAL  Final  MRSA Next Gen by PCR, Nasal     Status: None   Collection Time: 10/29/21  2:04 AM   Specimen: Nasal Mucosa; Nasal Swab  Result Value Ref Range Status   MRSA by PCR Next Gen NOT DETECTED NOT DETECTED Final    Comment: (NOTE) The GeneXpert MRSA Assay (FDA approved for NASAL specimens only), is one component of a comprehensive MRSA colonization surveillance program. It is not intended to diagnose MRSA infection nor to guide or monitor treatment for MRSA infections. Test performance is not FDA approved in patients less than 37 years old. Performed at [facility], [place] [address] [place], [place] [address]          Radiology Studies: US RENAL  Result Date: 11/03/2021 CLINICAL DATA:  Oliguria after procedure EXAM: RENAL / URINARY TRACT ULTRASOUND COMPLETE COMPARISON:  CT chest abdomen and pelvis 05/30/2021. FINDINGS: Right Kidney: Renal measurements: 11.2 x 5.1 x 6.8 cm = volume: 130 mL. Mildly echogenic. No hydronephrosis. Superior pole cyst measures 1.3 x 1.1 x 1.3 cm. Inferior pole cyst measures 1.4 x 1.8 x 1.5 cm. Multiple scratched at 9 mm shadowing calculus identified in the right mid kidney, unchanged. Left Kidney: Renal measurements: 10.3 x 4.4 x 5.6 cm = volume: 132 mL. Mildly echogenic. Mild hydronephrosis. There are 2 cysts in the superior pole measuring up to  2.4 cm. 9 mm shadowing calculus identified. Bladder: Appears normal for degree of bladder distention. Other: None. IMPRESSION: 1. Mild left hydronephrosis. 2. Mildly echogenic kidneys may be related to medical renal disease. 3. Bilateral renal calculi and bilateral renal cysts. Electronically Signed   By: Ronney Asters M.D.   On: 11/03/2021 20:35   DG HIP PORT UNILAT WITH PELVIS 1V LEFT  Result Date: 11/03/2021 CLINICAL DATA:  Fall. Left hip pain. Left hip fracture. Preoperative radiograph. EXAM: DG HIP (WITH OR WITHOUT PELVIS) 1V PORT LEFT COMPARISON:  Left hip radiographs and CT of the left hip 11/02/2021. FINDINGS: Nondisplaced subcapital left femoral neck fracture again demonstrated. No new fractures present. The ureteral stents are in place bilaterally. IMPRESSION: Nondisplaced subcapital left femoral neck fracture. Electronically Signed   By: San Morelle M.D.   On: 11/03/2021 16:22   CT Hip Left Wo Contrast  Result Date: 11/02/2021 CLINICAL DATA:  Hip pain, stress fracture suspected, neg xray EXAM: CT OF THE LEFT HIP WITHOUT CONTRAST TECHNIQUE: Multidetector CT imaging of the left hip was performed according to the standard protocol. Multiplanar CT image reconstructions were also generated. RADIATION DOSE REDUCTION: This exam was performed according to the departmental dose-optimization program which includes automated exposure control, adjustment of the mA and/or kV according to patient size and/or use of iterative reconstruction technique. COMPARISON:  Same day radiograph FINDINGS: Bones/Joint/Cartilage There is an acute left femoral neck fracture with impaction and minimal displacement, which courses through the lateral head neck junction and through the proximal third of the femoral neck medially. Small joint effusion. Ligaments Suboptimally assessed by CT. Muscles and Tendons No acute myotendinous abnormality by CT. Soft tissues Mild adjacent soft tissue swelling. IMPRESSION: Acute left  subcapital/transcervical left femoral neck fracture with impaction and minimal displacement. Electronically Signed   By: Maurine Simmering M.D.   On: 11/02/2021 16:38   DG Chest 1 View  Result Date: 11/02/2021 CLINICAL DATA:  Hip fracture. EXAM: CHEST  1 VIEW COMPARISON:  None Available. FINDINGS: Normal mediastinum and cardiac silhouette. Normal pulmonary vasculature. No evidence of effusion, infiltrate, or pneumothorax. No acute bony abnormality. IMPRESSION: No acute cardiopulmonary process. Electronically Signed   By: Suzy Bouchard M.D.   On: 11/02/2021 16:23   CT HEAD WO CONTRAST  Result Date: 11/02/2021 CLINICAL DATA:  Neuro deficit, acute, stroke suspected. Left sided weakness. EXAM: CT HEAD WITHOUT CONTRAST TECHNIQUE: Contiguous axial images were obtained from the base of the skull through the vertex without intravenous contrast. RADIATION DOSE REDUCTION: This exam was performed according to the departmental dose-optimization program which includes automated exposure control, adjustment of the mA and/or kV according to patient size and/or use of iterative reconstruction technique. COMPARISON:  Head CT 10/28/2021 and MRI 12/31/2019 FINDINGS: Brain: Within limitations of streak artifact from a right MCA aneurysm clip, there is no evidence of an acute infarct, intracranial hemorrhage, mass, midline shift, or extra-axial fluid collection. The ventricles are normal in size. Vascular: Prior aneurysm clipping in the right MCA bifurcation region. No hyperdense vessel. Skull: Right pterional craniotomy. Sinuses/Orbits: Paranasal sinuses and mastoid air cells are clear. Bilateral cataract extraction. Other: None. IMPRESSION: No evidence of acute intracranial abnormality. Electronically Signed   By: Logan Bores M.D.   On: 11/02/2021 15:19   DG Elbow Complete Left  Result Date: 11/02/2021 CLINICAL DATA:  Fall, pain EXAM: LEFT ELBOW - COMPLETE 3+ VIEW COMPARISON:  None Available. FINDINGS: Suspect a very subtle  fracture of the left radial neck. There is no evidence of arthropathy or other focal bone abnormality. Soft tissues are unremarkable. IMPRESSION: Suspect a very subtle fracture of the left radial neck. Correlate for point tenderness. No elbow joint effusion. Electronically Signed   By: Delanna Ahmadi M.D.   On: 11/02/2021 15:16   DG Hip Unilat W or Wo Pelvis 2-3 Views Left  Result Date: 11/02/2021 CLINICAL DATA:  Fall, left hip pain EXAM: DG HIP (WITH OR WITHOUT PELVIS) 2-3V LEFT COMPARISON:  None Available. FINDINGS: Nondisplaced fracture of the left femoral neck, which appears to be transcervical or basicervical but is not clearly assessed given internal rotation on all views provided. No displaced fracture of the bony pelvis or proximal right femur seen in single frontal view. IMPRESSION: Nondisplaced fracture of the left femoral neck, which appears to be transcervical or basicervical but is not clearly assessed given internal rotation on all views provided. Consider additional radiographic views or CT to more clearly evaluate fracture anatomy. Electronically Signed   By: Delanna Ahmadi M.D.   On: 11/02/2021 15:14        Scheduled Meds:  atorvastatin  40 mg Oral Daily   Chlorhexidine Gluconate Cloth  6 each Topical Daily   ciprofloxacin  500 mg Oral BID   DULoxetine  60 mg Oral Daily   gabapentin  900 mg Oral QHS   insulin aspart  0-15 Units Subcutaneous Q4H   insulin detemir  15 Units Subcutaneous QHS   rivaroxaban  10 mg Oral QHS   Continuous Infusions:  sodium chloride 100 mL/hr at 11/03/21 1732     LOS: 2 days    Time spent: 40 minutes    Irine Seal, MD [facility]   To contact the attending provider between 7A-7P or the covering provider during after hours 7P-7A, please log into the web site [email or web address] and access using universal [place] password for that web site. If you do not have the password, please call the hospital operator.  11/04/2021, 10:56 AM

## 2021-11-05 ENCOUNTER — Encounter (HOSPITAL_COMMUNITY): Payer: Self-pay | Admitting: Orthopaedic Surgery

## 2021-11-05 DIAGNOSIS — D649 Anemia, unspecified: Secondary | ICD-10-CM | POA: Diagnosis not present

## 2021-11-05 DIAGNOSIS — S72002A Fracture of unspecified part of neck of left femur, initial encounter for closed fracture: Secondary | ICD-10-CM | POA: Diagnosis not present

## 2021-11-05 DIAGNOSIS — E039 Hypothyroidism, unspecified: Secondary | ICD-10-CM | POA: Diagnosis not present

## 2021-11-05 DIAGNOSIS — R338 Other retention of urine: Secondary | ICD-10-CM | POA: Diagnosis not present

## 2021-11-05 LAB — BASIC METABOLIC PANEL
Anion gap: 4 — ABNORMAL LOW (ref 5–15)
BUN: 11 mg/dL (ref 8–23)
CO2: 27 mmol/L (ref 22–32)
Calcium: 8.3 mg/dL — ABNORMAL LOW (ref 8.9–10.3)
Chloride: 106 mmol/L (ref 98–111)
Creatinine, Ser: 0.75 mg/dL (ref 0.44–1.00)
GFR, Estimated: >60 mL/min (ref >60)
Glucose, Bld: 247 mg/dL — ABNORMAL HIGH (ref 70–99)
Potassium: 4.3 mmol/L (ref 3.5–5.1)
Sodium: 137 mmol/L (ref 135–145)

## 2021-11-05 LAB — GLUCOSE, CAPILLARY
Glucose-Capillary: 178 mg/dL — ABNORMAL HIGH (ref 70–99)
Glucose-Capillary: 188 mg/dL — ABNORMAL HIGH (ref 70–99)
Glucose-Capillary: 206 mg/dL — ABNORMAL HIGH (ref 70–99)
Glucose-Capillary: 221 mg/dL — ABNORMAL HIGH (ref 70–99)
Glucose-Capillary: 239 mg/dL — ABNORMAL HIGH (ref 70–99)
Glucose-Capillary: 314 mg/dL — ABNORMAL HIGH (ref 70–99)

## 2021-11-05 LAB — CBC WITH DIFFERENTIAL/PLATELET
Abs Immature Granulocytes: 0.02 10*3/uL (ref 0.00–0.07)
Basophils Absolute: 0 10*3/uL (ref 0.0–0.1)
Basophils Relative: 1 %
Eosinophils Absolute: 0 10*3/uL (ref 0.0–0.5)
Eosinophils Relative: 0 %
HCT: 33.7 % — ABNORMAL LOW (ref 36.0–46.0)
Hemoglobin: 10.7 g/dL — ABNORMAL LOW (ref 12.0–15.0)
Immature Granulocytes: 1 %
Lymphocytes Relative: 14 %
Lymphs Abs: 0.6 10*3/uL — ABNORMAL LOW (ref 0.7–4.0)
MCH: 24.7 pg — ABNORMAL LOW (ref 26.0–34.0)
MCHC: 31.8 g/dL (ref 30.0–36.0)
MCV: 77.8 fL — ABNORMAL LOW (ref 80.0–100.0)
Monocytes Absolute: 0.2 10*3/uL (ref 0.1–1.0)
Monocytes Relative: 5 %
Neutro Abs: 3.5 10*3/uL (ref 1.7–7.7)
Neutrophils Relative %: 79 %
Platelets: 334 10*3/uL (ref 150–400)
RBC: 4.33 MIL/uL (ref 3.87–5.11)
RDW: 14.3 % (ref 11.5–15.5)
WBC: 4.3 10*3/uL (ref 4.0–10.5)
nRBC: 0 % (ref 0.0–0.2)

## 2021-11-05 LAB — IRON AND TIBC
Iron: 15 ug/dL — ABNORMAL LOW (ref 28–170)
Saturation Ratios: 6 % — ABNORMAL LOW (ref 10.4–31.8)
TIBC: 263 ug/dL (ref 250–450)
UIBC: 248 ug/dL

## 2021-11-05 LAB — FERRITIN: Ferritin: 44 ng/mL (ref 11–307)

## 2021-11-05 LAB — VITAMIN B12: Vitamin B-12: 1687 pg/mL — ABNORMAL HIGH (ref 180–914)

## 2021-11-05 LAB — MAGNESIUM: Magnesium: 1.8 mg/dL (ref 1.7–2.4)

## 2021-11-05 LAB — URINE CULTURE: Culture: <10000 — AB

## 2021-11-05 LAB — FOLATE: Folate: 20.9 ng/mL (ref >5.9)

## 2021-11-05 MED ORDER — INSULIN ASPART 100 UNIT/ML IJ SOLN
3.0000 [IU] | Freq: Three times a day (TID) | INTRAMUSCULAR | Status: DC
  Administered 2021-11-06 – 2021-11-07 (×4): 3 [IU] via SUBCUTANEOUS

## 2021-11-05 MED ORDER — SENNOSIDES-DOCUSATE SODIUM 8.6-50 MG PO TABS
1.0000 | ORAL_TABLET | Freq: Two times a day (BID) | ORAL | Status: DC
  Administered 2021-11-05 – 2021-11-07 (×5): 1 via ORAL
  Filled 2021-11-05 (×5): qty 1

## 2021-11-05 MED ORDER — INSULIN DETEMIR 100 UNIT/ML ~~LOC~~ SOLN
18.0000 [IU] | Freq: Every day | SUBCUTANEOUS | Status: DC
  Administered 2021-11-05 – 2021-11-06 (×2): 18 [IU] via SUBCUTANEOUS
  Filled 2021-11-05 (×2): qty 0.18

## 2021-11-05 MED ORDER — INSULIN ASPART 100 UNIT/ML IJ SOLN
0.0000 [IU] | Freq: Three times a day (TID) | INTRAMUSCULAR | Status: DC
  Administered 2021-11-06: 3 [IU] via SUBCUTANEOUS
  Administered 2021-11-06 – 2021-11-07 (×2): 5 [IU] via SUBCUTANEOUS
  Administered 2021-11-07: 2 [IU] via SUBCUTANEOUS

## 2021-11-05 NOTE — Care Management Important Message (Signed)
Important Message  Patient Details  Name: Laura Gentry MRN: [id number] Date of Birth: Jul 28, 1955   Medicare Important Message Given:  Yes     Orbie Pyo 11/05/2021, 3:29 PM

## 2021-11-05 NOTE — Plan of Care (Signed)

## 2021-11-05 NOTE — Inpatient Diabetes Management (Signed)
Inpatient Diabetes Program Recommendations  AACE/ADA: New Consensus Statement on Inpatient Glycemic Control   Target Ranges:  Prepandial:   less than 140 mg/dL      Peak postprandial:   less than 180 mg/dL (1-2 hours)      Critically ill patients:  140 - 180 mg/dL    Latest Reference Range & Units 11/05/21 00:02 11/05/21 04:26 11/05/21 08:03  Glucose-Capillary 70 - 99 mg/dL 314 (H) 221 (H) 206 (H)    Latest Reference Range & Units 11/04/21 03:41 11/04/21 07:58 11/04/21 12:48 11/04/21 17:23 11/04/21 21:06  Glucose-Capillary 70 - 99 mg/dL 166 (H) 82 84 88 281 (H)   Review of Glycemic Control  Diabetes history: DM2 Outpatient Diabetes medications: Lantus 25 units QHS, Invokamet [phone] mg QAM Current orders for Inpatient glycemic control: Levemir 15 units QHS, Novolog 0-15 units Q4H  Inpatient Diabetes Program Recommendations:    Insulin: Please consider increasing Levemir to 18 units QHS, changing CBGs and Novolog to AC&HS, and ordering Novolog 3 units TID with meals for meal coverage if patient eats at least 50% of meals.   NOTE: Noted patient received Decadron 5 mg at 16:14 on 11/04/21 which is contributing to hyperglycemia. No other steroids ordered currently.  Thanks, Barnie Alderman, RN, MSN, [place] Diabetes Coordinator Inpatient Diabetes Program [phone] (Team Pager from 8am to [place])

## 2021-11-05 NOTE — Evaluation (Signed)
Physical Therapy Evaluation Patient Details Name: Umaima Scholten MRN: [id number] DOB: 1955/08/15 Today's Date: 11/05/2021  History of Present Illness  Ethelyn Cerniglia is a 66 y.o. female who presents with a fall on the Friday prior to hospitalization.  She was admitted to [facility] long for treatment of an obstructing kidney stone.  She did have a fall that Friday night and was subsequently having pain and difficulty ambulating following this.  She was initially discharged and re-presented to the emergency room with ongoing left hip and groin pain.  X-rays were obtained which found an impacted femoral neck fracture.  She is having difficulty walking.  She does not use a cane or walker at baseline.  Patient also has L radial head fx, no surgical intervention. WBAT for L UE and LE.   Clinical Impression  Patient received in bed and is very motivated to get up and moving. She requires min/mod A for basic mobility, pain limited ambulation at this time. She will continue to benefit from skilled PT to improve functional independence, safety and endurance.        Recommendations for follow up therapy are one component of a multi-disciplinary discharge planning process, led by the attending physician.  Recommendations may be updated based on patient status, additional functional criteria and insurance authorization.  Follow Up Recommendations Home health PT    Assistance Recommended at Discharge Frequent or constant Supervision/Assistance  Patient can return home with the following  A lot of help with walking and/or transfers;A little help with bathing/dressing/bathroom;Assist for transportation;Help with stairs or ramp for entrance    Equipment Recommendations None recommended by PT  Recommendations for Other Services       Functional Status Assessment Patient has had a recent decline in their functional status and demonstrates the ability to make significant improvements in function in a  reasonable and predictable amount of time.     Precautions / Restrictions Precautions Precautions: Fall Restrictions Weight Bearing Restrictions: Yes LUE Weight Bearing: Weight bearing as tolerated LLE Weight Bearing: Weight bearing as tolerated      Mobility  Bed Mobility Overal bed mobility: Needs Assistance Bed Mobility: Supine to Sit     Supine to sit: Mod assist, HOB elevated     General bed mobility comments: Assist with L LE    Transfers Overall transfer level: Needs assistance Equipment used: Rolling walker (2 wheels) Transfers: Sit to/from Stand, Bed to chair/wheelchair/BSC Sit to Stand: Min assist   Step pivot transfers: Min assist       General transfer comment: patient able to take a few steps from bed to recliner, pain limited, antalgic    Ambulation/Gait Ambulation/Gait assistance: Min assist Gait Distance (Feet): 2 Feet Assistive device: Rolling walker (2 wheels) Gait Pattern/deviations: Step-to pattern, Decreased step length - right, Decreased step length - left, Decreased stance time - left, Decreased weight shift to left, Antalgic Gait velocity: decr     General Gait Details: pain limited mobility. Difficulty weight shifting to her left  Stairs            Wheelchair Mobility    Modified Rankin (Stroke Patients Only)       Balance Overall balance assessment: Needs assistance Sitting-balance support: Feet supported Sitting balance-Leahy Scale: Good     Standing balance support: Bilateral upper extremity supported, During functional activity, Reliant on assistive device for balance Standing balance-Leahy Scale: Fair  Pertinent Vitals/Pain Pain Assessment Pain Assessment: Faces Faces Pain Scale: Hurts little more Pain Location: L LE Pain Descriptors / Indicators: Discomfort, Sore, Grimacing, Guarding Pain Intervention(s): Monitored during session, Premedicated before session, Limited  activity within patient's tolerance    Home Living Family/patient expects to be discharged to:: Private residence Living Arrangements: Parent;Other relatives Available Help at Discharge: Family;Available 24 hours/day Type of Home: House Home Access: Stairs to enter   [employer] of Steps: 5 total   Home Layout: One level Home Equipment: Conservation officer, nature (2 wheels);Shower seat - built in;Hand held shower head;Grab bars - tub/shower Additional Comments: lives with her sister and her mother who is 59    Prior Function Prior Level of Function : Independent/Modified Independent             Mobility Comments: did not use AD prior to admission, driving ADLs Comments: independent     Hand Dominance        Extremity/Trunk Assessment   Upper Extremity Assessment Upper Extremity Assessment: Defer to OT evaluation    Lower Extremity Assessment Lower Extremity Assessment: LLE deficits/detail LLE: Unable to fully assess due to pain LLE Coordination: decreased gross motor    Cervical / Trunk Assessment Cervical / Trunk Assessment: Normal  Communication   Communication: No difficulties  Cognition Arousal/Alertness: Awake/alert Behavior During Therapy: WFL for tasks assessed/performed Overall Cognitive Status: Within Functional Limits for tasks assessed                                 General Comments: very pleasant and motivated        General Comments      Exercises     Assessment/Plan    PT Assessment Patient needs continued PT services  PT Problem List Decreased strength;Decreased mobility;Decreased balance;Pain;Decreased activity tolerance;Decreased range of motion       PT Treatment Interventions DME instruction;Therapeutic exercise;Gait training;Stair training;Functional mobility training;Therapeutic activities;Patient/family education;Balance training    PT Goals (Current goals can be found in the Care Plan section)  Acute Rehab PT  Goals Patient Stated Goal: to return home, get better PT Goal Formulation: With patient Time For Goal Achievement: 11/12/21 Potential to Achieve Goals: Good    Frequency Min 4X/week     Co-evaluation               AM-PAC PT "6 Clicks" Mobility  Outcome Measure Help needed turning from your back to your side while in a flat bed without using bedrails?: A Little Help needed moving from lying on your back to sitting on the side of a flat bed without using bedrails?: A Little Help needed moving to and from a bed to a chair (including a wheelchair)?: A Lot Help needed standing up from a chair using your arms (e.g., wheelchair or bedside chair)?: A Little Help needed to walk in hospital room?: A Lot Help needed climbing 3-5 steps with a railing? : Total 6 Click Score: 14    End of Session Equipment Utilized During Treatment: Gait belt;Oxygen Activity Tolerance: Patient limited by pain Patient left: in chair;with call bell/phone within reach;with chair alarm set Nurse Communication: Mobility status PT Visit Diagnosis: Other abnormalities of gait and mobility (R26.89);Pain;Unsteadiness on feet (R26.81);History of falling (Z91.81) Pain - Right/Left: Left Pain - part of body: Leg    Time: 7782-4235 PT Time Calculation (min) (ACUTE ONLY): 32 min   Charges:   PT Evaluation $PT Eval Moderate Complexity: 1 Mod  PT Treatments $Therapeutic Activity: 8-22 mins        Taichi Repka, PT, GCS 11/05/21,3:53 PM

## 2021-11-05 NOTE — Anesthesia Postprocedure Evaluation (Signed)
Anesthesia Post Note  Patient: Laura Gentry  Procedure(s) Performed: CANNULATED HIP PINNING (Left: Hip)     Patient location during evaluation: PACU Anesthesia Type: General Level of consciousness: sedated Pain management: pain level controlled Vital Signs Assessment: post-procedure vital signs reviewed and stable Respiratory status: spontaneous breathing and respiratory function stable Cardiovascular status: stable Postop Assessment: no apparent nausea or vomiting Anesthetic complications: no   No notable events documented.                Larnie Heart DANIEL

## 2021-11-05 NOTE — Progress Notes (Signed)
PROGRESS NOTE    Laura Gentry  WGN:[id number] DOB: 1955-07-29 DOA: 11/02/2021 PCP: Seward Carol, MD    Chief Complaint  Patient presents with   Hip Pain    Brief Narrative:  66 y.o. WF PMHx nephrolithiasis, DM type 2 insulin-dependent, HLD, thyroid disease, migraines, brain aneurysm, lumbosacral radiculopathy   Admitted to [facility] on 10/28/2021 by way of transfer from [place] emergency department with obstructing right lithiasis after presenting from home to the latter complaining of nausea/vomiting.  Discharged on 6/10 s/pcystoscopy with bilateral stent placement on 6/7   Admitted to [place] fall 2 days ago in the hospital when her feet slipped out from under her she landed on her right side.  She said she was walking up until that moment and then was having difficulty walking on discharge.  She remembers looking at the instructions for the antibiotics they sent her home with the side effects calling joint pain.  She has been unable to ambulate complaining of severe pain on her left hip and in her left elbow.  She has had not had trauma since discharge of the hospital.  Today the pain was so severe that family called the ambulance to transport her back here.  Complaining of pain in her left hip and left elbow with any type of movement.  No numbness or weakness.  No recent fevers or abdominal pain. Patient seen by orthopedics transferred to [facility] for surgical repair of hip fracture.   Assessment & Plan:   Active Problems:   Calculus of kidney   Ureteral obstruction, right   Severe sepsis [facility])   Acquired hypothyroidism   Left elbow fracture   S/p left hip fracture   Essential hypertension   Complicated UTI (urinary tract infection)   Anemia, unspecified   Acute urinary retention   Hematuria  #1 left impacted femoral neck fracture -Sustained after a fall during last hospitalization. -Plain films of the left hip and pelvis, CT  left hip consistent with acute left subcapital/transcervical left femoral neck fracture with impaction and minimal displacement, -Patient seen by orthopedics and patient status post left hip cannulated screw placement per Dr. Sammuel Hines 11/04/2021 without any complications.   -PT/OT.  -Pain management per orthopedics.  2.  Acute urinary retention/hematuria -Patient noted to have decreased urine output on 11/03/2021. -Acute urinary retention felt likely secondary to pain from hip fracture in the setting of poor mobility. -Renal ultrasound done with a left-sided hydronephrosis. -Case discussed with urology who are recommending placement of Foley catheter for a week to allow bladder to recover and outpatient follow-up with urology for voiding trial or catheter may be removed at the rehab in the week. -Foley catheter placed the morning 11/04/2021 and hematuria noted in Foley bag. -Hematuria likely secondary to traumatic Foley catheter placement. -Irrigate Foley catheter every 4 hours for the next 24 hours and reassess.. -Follow-up.  3.  Left elbow fracture -Secondary to mechanical fall. -Currently in sling. -Likely nonoperative management. -Per orthopedics.  4.  History of recent right obstructing ureteral stones -Status post cystoscopy with bilateral stent placement 10/29/2021. -Patient noted to have been placed on Percocet for pain control and fentanyl as needed. -6/9 per urology, Dr. Bess Harvest patient okay to transition to ciprofloxacin 500 twice daily to complete a 10-day course of treatment. -6/9 urology to arrange outpatient ureteroscopy with goal of stone free and elective setting in several weeks.  5.  Severe sepsis/complicated UTI -Noted during last hospitalization as patient noted  with a leukocytosis, tachycardia, elevated lactic acid level. -Continue antibiotics to complete a 14-day course.  6.  Diabetes mellitus type 2 with hyperglycemia -Hemoglobin A1c 8.8 (10/29/2021). -CBG noted at 206  this morning. -Increase Levemir to 18 units daily, placed on NovoLog 3 units 3 times daily with meals for meal coverage.  SSI.  Neurontin.  7.  Hypertension -Currently controlled not on any antihypertensive medications at this time.  8.  Hypothyroidism 6/7 TSH 3.4 within normal limits -Patient not on any thyroid medication prior to admission. -Per prior physician patient noted to have stated she takes natural hormone replacement.  9.  Anemia -Hemoglobin stable at 10.7. -Follow H&H. Transfusion threshold hemoglobin < 7.  10.  Hypokalemia/hyperkalemia/hypomagnesemia -Potassium noted at 3.2 and subsequently at 6.4 on 11/02/2021. -Potassium currently at 3.8. -Magnesium noted at 1.6 magnesium repleted and currently at 2.5.    DVT prophylaxis: DVT prophylaxis per orthopedics postop. Code Status: Full Family Communication: Updated patient.  No family at bedside.  Disposition: TBD  Status is: Inpatient Remains inpatient appropriate because: Severity of illness   Consultants:  Orthopedics: Dr.Bokshan 11/03/2021 [facility]ide urology.  Procedures:  CT left hip 11/02/2021 CT head 11/02/2021 Chest x-ray 11/02/2021 Renal ultrasound 11/03/2021 Plain films of the left elbow 11/02/2021 Plain films of the left hip and pelvis 11/02/2021 Left hip cannulated screw placement per Dr.Bokshan 11/04/2021  Antimicrobials:  Ciprofloxacin 11/02/2021>>>>   Subjective: Laying in bed.  Awaiting evaluation by physical therapy and wants to start working with them.  No chest pain.  No shortness of breath.  Still with some hematuria noted in Foley bag.   Objective: Vitals:   11/05/21 0006 11/05/21 0426 11/05/21 0700 11/05/21 1132  BP: 116/68 102/65 102/65 105/80  Pulse:  72    Resp: 15 19    Temp:  98.1 F (36.7 C)  98 F (36.7 C)  TempSrc:  Oral  Oral  SpO2: 93% 94%    Weight:      Height:        Intake/Output Summary (Last 24 hours) at 11/05/2021 1246 Last data filed at 11/05/2021 0635 Gross per 24  hour  Intake --  Output 1550 ml  Net -1550 ml    Filed Weights   11/04/21 1418  Weight: 71.5 kg    Examination:  General exam: NAD. Respiratory system: CTA B.  No wheezes, no crackles, no rhonchi.  Fair air movement.  Cardiovascular system: RRR no murmurs rubs or gallops.  No JVD.  No lower extremity edema. Gastrointestinal system: Abdomen is nondistended, soft and nontender. No organomegaly or masses felt. Normal bowel sounds heard. Central nervous system: Alert and oriented. No focal neurological deficits. Extremities: Left upper extremity in sling. Skin: No rashes, lesions or ulcers Psychiatry: Judgement and insight appear normal. Mood & affect appropriate.     Data Reviewed: I have personally reviewed following labs and imaging studies  CBC: Recent Labs  Lab 11/01/21 0519 11/02/21 1504 11/02/21 1802 11/03/21 0857 11/04/21 0202 11/05/21 0225  WBC 9.7 9.3  --  7.3 6.7 4.3  NEUTROABS 7.8* 6.9  --  4.3 3.6 3.5  HGB 10.9* 13.0 11.9* 10.8* 11.0* 10.7*  HCT 35.3* 41.4 35.0* 34.2* 34.4* 33.7*  MCV 78.6* 78.0*  --  78.8* 77.5* 77.8*  PLT 247 316  --  285 278 334     Basic Metabolic Panel: Recent Labs  Lab 10/30/21 0259 10/31/21 0436 11/01/21 0519 11/02/21 1548 11/02/21 1802 11/03/21 0857 11/03/21 1707 11/04/21 0202 11/05/21 0225  NA 143 139 141  137 136 137 137 136 137  K 3.4* 3.8 3.2* 6.4* 4.9 3.7 4.0 3.8 4.3  CL 110 105 107 102 100 104 103 107 106  CO2 '26 26 26 26  '$ --  '27 26 23 27  '$ GLUCOSE 71 186* 163* 235* 221* 164* 90 143* 247*  BUN '11 10 9 11 10 13 14 13 11  '$ CREATININE 0.66 0.59 0.70 0.90 0.70 0.70 0.80 0.76 0.75  CALCIUM 8.6* 8.4* 8.6* 8.6*  --  8.4* 9.0 7.7* 8.3*  MG 1.7 1.4* 1.6*  --   --  1.6*  --  2.5* 1.8  PHOS 3.7 3.9 3.8  --   --   --   --   --   --      GFR: Estimated Creatinine Clearance: 67.3 mL/min (by C-G formula based on SCr of 0.75 mg/dL).  Liver Function Tests: Recent Labs  Lab 10/30/21 0259 10/31/21 0436 11/01/21 0519  11/02/21 1548  AST 11* 13* 14* 42*  ALT '10 9 12 8  '$ ALKPHOS 66 60 68 72  BILITOT 0.8 0.7 0.8 2.2*  PROT 6.0* 5.8* 6.2* 6.8  ALBUMIN 2.8* 2.7* 3.0* 3.0*     CBG: Recent Labs  Lab 11/04/21 2106 11/05/21 0002 11/05/21 0426 11/05/21 0803 11/05/21 1134  GLUCAP 281* 314* 221* 206* 239*      Recent Results (from the past 240 hour(s))  SARS Coronavirus 2 by RT PCR (hospital order, performed in [facility] hospital lab) *cepheid single result test* Anterior Nasal Swab     Status: None   Collection Time: 10/28/21  6:55 PM   Specimen: Anterior Nasal Swab  Result Value Ref Range Status   SARS Coronavirus 2 by RT PCR NEGATIVE NEGATIVE Final    Comment: (NOTE) SARS-CoV-2 target nucleic acids are NOT DETECTED.  The SARS-CoV-2 RNA is generally detectable in upper and lower respiratory specimens during the acute phase of infection. The lowest concentration of SARS-CoV-2 viral copies this assay can detect is 250 copies / mL. A negative result does not preclude SARS-CoV-2 infection and should not be used as the sole basis for treatment or other patient management decisions.  A negative result may occur with improper specimen collection / handling, submission of specimen other than nasopharyngeal swab, presence of viral mutation(s) within the areas targeted by this assay, and inadequate number of viral copies (<250 copies / mL). A negative result must be combined with clinical observations, patient history, and epidemiological information.  Fact Sheet for Patients:   https://www.patel.info/  Fact Sheet for Healthcare Providers: https://hall.com/  This test is not yet approved or  cleared by the [place] FDA and has been authorized for detection and/or diagnosis of SARS-CoV-2 by FDA under an Emergency Use Authorization (EUA).  This EUA will remain in effect (meaning this test can be used) for the duration of the COVID-19 declaration  under Section 564(b)(1) of the Act, 21 U.S.C. section 360bbb-3(b)(1), unless the authorization is terminated or revoked sooner.  Performed at [facility], [address], [place], [place] [address]   Urine Culture     Status: Abnormal   Collection Time: 10/28/21  8:45 PM   Specimen: Urine, Clean Catch  Result Value Ref Range Status   Specimen Description   Final    URINE, CLEAN CATCH Performed at [facility], [address] [place], [place] [address]    Special Requests   Final    NONE Performed at [facility], [address] [place], [place] [address]    Culture MULTIPLE SPECIES PRESENT, SUGGEST RECOLLECTION (A)  Final   Report Status 10/29/2021 FINAL  Final  MRSA Next Gen by PCR, Nasal     Status: None   Collection Time: 10/29/21  2:04 AM   Specimen: Nasal Mucosa; Nasal Swab  Result Value Ref Range Status   MRSA by PCR Next Gen NOT DETECTED NOT DETECTED Final    Comment: (NOTE) The GeneXpert MRSA Assay (FDA approved for NASAL specimens only), is one component of a comprehensive MRSA colonization surveillance program. It is not intended to diagnose MRSA infection nor to guide or monitor treatment for MRSA infections. Test performance is not FDA approved in patients less than 69 years old. Performed at [facility], [place] [address] [place], [place] [address]   Urine Culture     Status: Abnormal   Collection Time: 11/04/21  7:01 AM   Specimen: Urine, Catheterized  Result Value Ref Range Status   Specimen Description URINE, CATHETERIZED  Final   Special Requests NONE  Final   Culture (A)  Final    <10,000 COLONIES/mL INSIGNIFICANT GROWTH Performed at [facility], [place] [address], [place], [place] [address]    Report Status 11/05/2021 FINAL  Final         Radiology Studies: DG HIP UNILAT WITH PELVIS 2-3 VIEWS LEFT  Result Date: 11/04/2021 CLINICAL DATA:  Intraoperative fluoroscopy for left side  cannulated hip pinning. EXAM: DG HIP (WITH OR WITHOUT PELVIS) 2-3V LEFT COMPARISON:  Left hip radiographs 11/03/2021 and 11/02/2021 FINDINGS: Images were performed intraoperatively without the presence of a radiologist. Images demonstrate placement of 3 screws traversing the previously seen femoral neck fracture. Mild inferior displacement of the distal fracture component respect of the proximal fracture component appears similar to prior. No complication is seen. Total fluoroscopy images: 6 Total fluoroscopy time: 79 seconds Total dose: Radiation Exposure Index (as provided by the fluoroscopic device): 14.69 mGy air Kerma Please see intraoperative findings for further detail. IMPRESSION: Intraoperative fluoroscopy for proximal femoral ORIF. Electronically Signed   By: Yvonne Kendall M.D.   On: 11/04/2021 17:09   DG C-Arm 1-60 Min-No Report  Result Date: 11/04/2021 Fluoroscopy was utilized by the requesting physician.  No radiographic interpretation.   US RENAL  Result Date: 11/03/2021 CLINICAL DATA:  Oliguria after procedure EXAM: RENAL / URINARY TRACT ULTRASOUND COMPLETE COMPARISON:  CT chest abdomen and pelvis 05/30/2021. FINDINGS: Right Kidney: Renal measurements: 11.2 x 5.1 x 6.8 cm = volume: 130 mL. Mildly echogenic. No hydronephrosis. Superior pole cyst measures 1.3 x 1.1 x 1.3 cm. Inferior pole cyst measures 1.4 x 1.8 x 1.5 cm. Multiple scratched at 9 mm shadowing calculus identified in the right mid kidney, unchanged. Left Kidney: Renal measurements: 10.3 x 4.4 x 5.6 cm = volume: 132 mL. Mildly echogenic. Mild hydronephrosis. There are 2 cysts in the superior pole measuring up to 2.4 cm. 9 mm shadowing calculus identified. Bladder: Appears normal for degree of bladder distention. Other: None. IMPRESSION: 1. Mild left hydronephrosis. 2. Mildly echogenic kidneys may be related to medical renal disease. 3. Bilateral renal calculi and bilateral renal cysts. Electronically Signed   By: Ronney Asters M.D.    On: 11/03/2021 20:35   DG HIP PORT UNILAT WITH PELVIS 1V LEFT  Result Date: 11/03/2021 CLINICAL DATA:  Fall. Left hip pain. Left hip fracture. Preoperative radiograph. EXAM: DG HIP (WITH OR WITHOUT PELVIS) 1V PORT LEFT COMPARISON:  Left hip radiographs and CT  of the left hip 11/02/2021. FINDINGS: Nondisplaced subcapital left femoral neck fracture again demonstrated. No new fractures present. The ureteral stents are in place bilaterally. IMPRESSION: Nondisplaced subcapital left femoral neck fracture. Electronically Signed   By: San Morelle M.D.   On: 11/03/2021 16:22        Scheduled Meds:  aspirin  325 mg Oral Daily   atorvastatin  40 mg Oral Daily   Chlorhexidine Gluconate Cloth  6 each Topical Daily   ciprofloxacin  500 mg Oral BID   DULoxetine  60 mg Oral Daily   gabapentin  900 mg Oral QHS   insulin aspart  0-15 Units Subcutaneous Q4H   insulin detemir  15 Units Subcutaneous QHS   senna-docusate  1 tablet Oral BID   Continuous Infusions:  sodium chloride 100 mL/hr at 11/05/21 7371     LOS: 3 days    Time spent: 40 minutes    Irine Seal, MD [facility]   To contact the attending provider between 7A-7P or the covering provider during after hours 7P-7A, please log into the web site [email or web address] and access using universal [place] password for that web site. If you do not have the password, please call the hospital operator.  11/05/2021, 12:46 PM

## 2021-11-05 NOTE — Progress Notes (Signed)
   Subjective:  Patient reports pain as mild.  She is excited to mobilize with physical therapy.  Tolerating diet.  Objective:   VITALS:   Vitals:   11/04/21 2006 11/05/21 0006 11/05/21 0426 11/05/21 0700  BP: 129/65 116/68 102/65 102/65  Pulse: (!) 107  72   Resp: '17 15 19   '$ Temp: 98.1 F (36.7 C)  98.1 F (36.7 C)   TempSrc: Oral  Oral   SpO2: 96% 93% 94%   Weight:      Height:        Surgical dressing over left hip is clean dry and intact.  She is able to extend and flex at all the toes of the left foot.  Fires EHL.  2+ dorsalis pedis pulse.  Lab Results  Component Value Date   WBC 4.3 11/05/2021   HGB 10.7 (L) 11/05/2021   HCT 33.7 (L) 11/05/2021   MCV 77.8 (L) 11/05/2021   PLT 334 11/05/2021     Assessment/Plan:  1 Day Post-Op status post left hip cannulated screw placement doing well.  - Expected postop acute blood loss anemia - will monitor for symptoms - Patient to work with PT/OT to optimize mobilization safely - DVT ppx - SCDs, ambulation, okay with her starting home anticoagulation instead of Lovenox - WBAT operative extremity - Pain control - multimodal pain management, ATC acetaminophen in conjunction with as needed narcotic (oxycodone), although this should be minimized with other modalities    Nayib Remer 11/05/2021, 8:24 AM

## 2021-11-06 DIAGNOSIS — S72002A Fracture of unspecified part of neck of left femur, initial encounter for closed fracture: Secondary | ICD-10-CM | POA: Diagnosis not present

## 2021-11-06 DIAGNOSIS — E039 Hypothyroidism, unspecified: Secondary | ICD-10-CM | POA: Diagnosis not present

## 2021-11-06 DIAGNOSIS — R338 Other retention of urine: Secondary | ICD-10-CM | POA: Diagnosis not present

## 2021-11-06 DIAGNOSIS — D649 Anemia, unspecified: Secondary | ICD-10-CM | POA: Diagnosis not present

## 2021-11-06 LAB — CBC WITH DIFFERENTIAL/PLATELET
Abs Immature Granulocytes: 0.01 10*3/uL (ref 0.00–0.07)
Basophils Absolute: 0.1 10*3/uL (ref 0.0–0.1)
Basophils Relative: 1 %
Eosinophils Absolute: 0.2 10*3/uL (ref 0.0–0.5)
Eosinophils Relative: 3 %
HCT: 30.1 % — ABNORMAL LOW (ref 36.0–46.0)
Hemoglobin: 9.7 g/dL — ABNORMAL LOW (ref 12.0–15.0)
Immature Granulocytes: 0 %
Lymphocytes Relative: 41 %
Lymphs Abs: 3 10*3/uL (ref 0.7–4.0)
MCH: 25.1 pg — ABNORMAL LOW (ref 26.0–34.0)
MCHC: 32.2 g/dL (ref 30.0–36.0)
MCV: 77.8 fL — ABNORMAL LOW (ref 80.0–100.0)
Monocytes Absolute: 0.6 10*3/uL (ref 0.1–1.0)
Monocytes Relative: 8 %
Neutro Abs: 3.5 10*3/uL (ref 1.7–7.7)
Neutrophils Relative %: 47 %
Platelets: 353 10*3/uL (ref 150–400)
RBC: 3.87 MIL/uL (ref 3.87–5.11)
RDW: 14.6 % (ref 11.5–15.5)
WBC: 7.3 10*3/uL (ref 4.0–10.5)
nRBC: 0 % (ref 0.0–0.2)

## 2021-11-06 LAB — BASIC METABOLIC PANEL
Anion gap: 7 (ref 5–15)
BUN: 12 mg/dL (ref 8–23)
CO2: 24 mmol/L (ref 22–32)
Calcium: 8.3 mg/dL — ABNORMAL LOW (ref 8.9–10.3)
Chloride: 107 mmol/L (ref 98–111)
Creatinine, Ser: 0.76 mg/dL (ref 0.44–1.00)
GFR, Estimated: >60 mL/min (ref >60)
Glucose, Bld: 225 mg/dL — ABNORMAL HIGH (ref 70–99)
Potassium: 4.1 mmol/L (ref 3.5–5.1)
Sodium: 138 mmol/L (ref 135–145)

## 2021-11-06 LAB — GLUCOSE, CAPILLARY
Glucose-Capillary: 115 mg/dL — ABNORMAL HIGH (ref 70–99)
Glucose-Capillary: 154 mg/dL — ABNORMAL HIGH (ref 70–99)
Glucose-Capillary: 203 mg/dL — ABNORMAL HIGH (ref 70–99)
Glucose-Capillary: 220 mg/dL — ABNORMAL HIGH (ref 70–99)
Glucose-Capillary: 223 mg/dL — ABNORMAL HIGH (ref 70–99)
Glucose-Capillary: 245 mg/dL — ABNORMAL HIGH (ref 70–99)

## 2021-11-06 MED ORDER — ALENDRONATE SODIUM 70 MG PO TABS: 70.0000 mg | ORAL_TABLET | ORAL | Status: DC

## 2021-11-06 MED ORDER — INSULIN DETEMIR 100 UNIT/ML ~~LOC~~ SOLN
22.0000 [IU] | Freq: Every day | SUBCUTANEOUS | Status: DC
  Filled 2021-11-06: qty 0.22

## 2021-11-06 MED ORDER — VITAMIN B-12 1000 MCG PO TABS
1000.0000 ug | ORAL_TABLET | Freq: Every day | ORAL | Status: DC
  Administered 2021-11-06 – 2021-11-07 (×2): 1000 ug via ORAL
  Filled 2021-11-06 (×2): qty 1

## 2021-11-06 NOTE — TOC Initial Note (Signed)
Transition of Care [facility]) - Initial/Assessment Note    Patient Details  Name: Laura Gentry MRN: [id number] Date of Birth: [birth year]  Transition of Care [facility]) CM/SW Contact:    Joanne Chars, LCSW Phone Number: 11/06/2021, 2:36 PM  Clinical Narrative:  CSW met with pt for initial assessment.  Discussed PT recommendation for [facility].  Pt reports she lives at home with her elderly mother and her younger sister, Shirlean Mylar.  Permission given to speak with them.  Sister works and has encouraged pt to go to SNF, pt is open to this.  Pt is vaccinated for covid with 2 boosters.  CSW spoke with Laura/PT who will be working with pt shortly.  1430: Mickel Baas just worked with pt and she did very well, including stairs.  Does not require SNF.  Mickel Baas and pt spoke with pt sister Shirlean Mylar on the phone and she wants to come to hospital tomorrow for PT session, but they are now open to plan for home with [facility].    Pt has walker, bedside commode and shower chair in home currently.              Expected Discharge Plan: [place] Barriers to Discharge: Continued Medical Work up   Patient Goals and CMS Choice Patient states their goals for this hospitalization and ongoing recovery are:: be how I used to be      Expected Discharge Plan and Services Expected Discharge Plan: [place] In-house Referral: Clinical Social Work   Post Acute Care Choice: [place] arrangements for the past 2 months: [place]                                      Prior Living Arrangements/Services Living arrangements for the past 2 months: Single Family Home Lives with:: Parents, Siblings (mother and sister) Patient language and need for interpreter reviewed:: Yes Do you feel safe going back to the place where you live?: Yes      Need for Family Participation in Patient Care: No (Comment) Care giver support system in place?: Yes (comment) Current home services: Other  (comment) (none) Criminal Activity/Legal Involvement Pertinent to Current Situation/Hospitalization: No - Comment as needed  Activities of Daily Living Home Assistive Devices/Equipment: CBG Meter, Dentures (specify type), Eyeglasses (upper and lower) ADL Screening (condition at time of admission) Patient's cognitive ability adequate to safely complete daily activities?: Yes Is the patient deaf or have difficulty hearing?: No Does the patient have difficulty seeing, even when wearing glasses/contacts?: No Does the patient have difficulty concentrating, remembering, or making decisions?: No Patient able to express need for assistance with ADLs?: Yes Does the patient have difficulty dressing or bathing?: No Independently performs ADLs?: No Communication: Independent Dressing (OT): Needs assistance Is this a change from baseline?: Change from baseline, expected to last >3 days Grooming: Independent Feeding: Independent Bathing: Needs assistance Is this a change from baseline?: Change from baseline, expected to last >3 days Toileting: Needs assistance Is this a change from baseline?: Change from baseline, expected to last >3days In/Out Bed: Needs assistance Is this a change from baseline?: Change from baseline, expected to last >3 days Walks in Home: Needs assistance Is this a change from baseline?: Change from baseline, expected to last >3 days Does the patient have difficulty walking or climbing stairs?: Yes Weakness of Legs: Left Weakness of Arms/Hands: Left  Permission  Sought/Granted Permission sought to share information with : Family Supports Permission granted to share information with : Yes, Verbal Permission Granted  Share Information with NAME: sister Shirlean Mylar           Emotional Assessment Appearance:: Appears stated age Attitude/Demeanor/Rapport: Engaged Affect (typically observed): Pleasant, Appropriate Orientation: : Oriented to Self, Oriented to Place, Oriented to   Time, Oriented to Situation      Admission diagnosis:  Hip fracture ([place]) [S72.009A] Closed fracture of left hip, initial encounter ([place]) [S72.002A] Closed fracture of left elbow, initial encounter [S42.402A] Patient Active Problem List   Diagnosis Date Noted   Acute urinary retention 11/04/2021   Hematuria 11/04/2021   Anemia, unspecified 11/03/2021   Hip fracture ([place]) 11/02/2021   Left elbow fracture 11/02/2021   S/p left hip fracture 11/02/2021   Essential hypertension 62/83/6629   Complicated UTI (urinary tract infection) 11/02/2021   Severe sepsis ([place]) 10/29/2021   DKA (diabetic ketoacidosis) ([place]) 10/29/2021   AKI (acute kidney injury) ([place]) 10/29/2021   Acquired hypothyroidism 10/29/2021   Ureteral obstruction, right 10/28/2021   Breast cancer screening by mammogram 01/31/2018   Need for immunization against influenza 01/31/2018   Elevated blood pressure reading 01/31/2018   Migraines 09/22/2016   Tremor of both hands 09/22/2016   History of colonic polyps 09/22/2016   HLD (hyperlipidemia) 03/07/2015   Difficulty sleeping 03/07/2015   Type 2 diabetes mellitus with peripheral neuropathy ([place]) 03/07/2015   Fatigue 03/07/2015   UTI (urinary tract infection) 03/07/2015   History of intracranial aneurysm 03/17/2013   History of depression 02/01/2013   Disorder of esophagus 08/19/2012   Calculus of kidney 09/22/2011   H/O malignant neoplasm of skin 09/22/2011   PCP:  Seward Carol, MD Pharmacy:   [facility] [phone] [address], [address] AT [facility] [address] [place] [id number] Phone: [phone] Fax: [phone]     Social Determinants of Health (SDOH) Interventions    Readmission Risk Interventions     No data to display

## 2021-11-06 NOTE — Plan of Care (Signed)

## 2021-11-06 NOTE — Progress Notes (Signed)
   Subjective:  Patient reports pain as mild. Was able to mobilize to chair with PT. Overall hip feels more stable. Tolerating diet.  Objective:   VITALS:   Vitals:   11/05/21 1546 11/05/21 1559 11/05/21 2010 11/06/21 0400  BP:  132/72 137/74 111/84  Pulse:  96 92 75  Resp:  '18 16 16  '$ Temp:  98.9 F (37.2 C) 98.5 F (36.9 C) 98 F (36.7 C)  TempSrc:  Oral Oral Oral  SpO2: 95% 94% 91% 96%  Weight:      Height:        Surgical dressing over left hip is clean dry and intact.  She is able to extend and flex at all the toes of the left foot.  Fires EHL.  2+ dorsalis pedis pulse.  Lab Results  Component Value Date   WBC 7.3 11/06/2021   HGB 9.7 (L) 11/06/2021   HCT 30.1 (L) 11/06/2021   MCV 77.8 (L) 11/06/2021   PLT 353 11/06/2021     Assessment/Plan:  2 Days Post-Op status post left hip cannulated screw placement doing well.  - Expected postop acute blood loss anemia - will monitor for symptoms - Patient to work with PT/OT to optimize mobilization safely - DVT ppx - SCDs, ambulation, okay with her starting home anticoagulation instead of Lovenox - WBAT operative extremity - Pain control - multimodal pain management, ATC acetaminophen in conjunction with as needed narcotic (oxycodone), although this should be minimized with other modalities  - Overall I believe patient would benefit from assistance at a skilled nursing facility given the fact that she does not have any one at home capable of providing additional care.   Laura Gentry 11/06/2021, 7:15 AM

## 2021-11-06 NOTE — Progress Notes (Signed)
PROGRESS NOTE    Laura Gentry  FFM:[id number] DOB: 10-24-1955 DOA: 11/02/2021 PCP: Seward Carol, MD    Chief Complaint  Patient presents with   Hip Pain    Brief Narrative:  66 y.o. WF PMHx nephrolithiasis, DM type 2 insulin-dependent, HLD, thyroid disease, migraines, brain aneurysm, lumbosacral radiculopathy   Admitted to [facility] on 10/28/2021 by way of transfer from [place] emergency department with obstructing right lithiasis after presenting from home to the latter complaining of nausea/vomiting.  Discharged on 6/10 s/pcystoscopy with bilateral stent placement on 6/7   Admitted to [place] fall 2 days ago in the hospital when her feet slipped out from under her she landed on her right side.  She said she was walking up until that moment and then was having difficulty walking on discharge.  She remembers looking at the instructions for the antibiotics they sent her home with the side effects calling joint pain.  She has been unable to ambulate complaining of severe pain on her left hip and in her left elbow.  She has had not had trauma since discharge of the hospital.  Today the pain was so severe that family called the ambulance to transport her back here.  Complaining of pain in her left hip and left elbow with any type of movement.  No numbness or weakness.  No recent fevers or abdominal pain. Patient seen by orthopedics transferred to [facility] for surgical repair of hip fracture.   Assessment & Plan:   Active Problems:   Calculus of kidney   Ureteral obstruction, right   Severe sepsis [facility])   Acquired hypothyroidism   Left elbow fracture   S/p left hip fracture   Essential hypertension   Complicated UTI (urinary tract infection)   Anemia, unspecified   Acute urinary retention   Hematuria  #1 left impacted femoral neck fracture -Sustained after a fall during last hospitalization. -Plain films of the left hip and pelvis, CT  left hip consistent with acute left subcapital/transcervical left femoral neck fracture with impaction and minimal displacement, -Patient seen by orthopedics and patient status post left hip cannulated screw placement per Dr. Sammuel Hines 11/04/2021 without any complications.   -PT/OT.  -Pain management per orthopedics. -Patient noted to be living by herself and concern as to whether patient would have enough support if she was to discharge home with home health. -TOC consulted to assess for possible SNF placement. -Noted per [facility] patient would prefer to go home with home health and is refusing SNF.  2.  Acute urinary retention/hematuria -Patient noted to have decreased urine output on 11/03/2021. -Acute urinary retention felt likely secondary to pain from hip fracture in the setting of poor mobility. -Renal ultrasound done with a left-sided hydronephrosis. -Case discussed with urology who are recommending placement of Foley catheter for a week to allow bladder to recover and outpatient follow-up with urology for voiding trial or catheter may be removed at the rehab in the week. -Foley catheter placed the morning 11/04/2021 and hematuria noted in Foley bag. -Hematuria likely secondary to traumatic Foley catheter placement. -Hematuria improving with irrigation of Foley catheter. -Patient wanting Foley catheter to be discontinued. -Voiding trial in the AM. -Outpatient follow-up with urology.  3.  Left elbow fracture -Secondary to mechanical fall. -Currently in sling. -Likely nonoperative management. -Per orthopedics.  4.  History of recent right obstructing ureteral stones -Status post cystoscopy with bilateral stent placement 10/29/2021. -Patient noted to have been placed on  Percocet for pain control and fentanyl as needed. -6/9 per urology, Dr. Bess Harvest patient okay to transition to ciprofloxacin 500 twice daily to complete a 10-day course of treatment. -6/9 urology to arrange outpatient ureteroscopy  with goal of stone free and elective setting in several weeks.  5.  Severe sepsis/complicated UTI -Noted during last hospitalization as patient noted with a leukocytosis, tachycardia, elevated lactic acid level. -Continue antibiotics to complete a 14-day course.  6.  Diabetes mellitus type 2 with hyperglycemia -Hemoglobin A1c 8.8 (10/29/2021). -CBG noted at 154 this morning. -Increase Levemir to 22 units daily.  Continue NovoLog 3 units 3 times daily with meals.  Continue SSI.  Continue Neurontin.   7.  Hypertension -Currently controlled not on any antihypertensive medications at this time. -If blood pressure control needed may consider starting patient on Norvasc. -Follow-up.  8.  Hypothyroidism 6/7 TSH 3.4 within normal limits -Patient not on any thyroid medication prior to admission. -Per prior physician patient noted to have stated she takes natural hormone replacement.  9.  Anemia/postop acute blood loss anemia -Hemoglobin slowly trickling down currently at 9.7.Marland Kitchen -Follow H&H. Transfusion threshold hemoglobin < 7.  10.  Hypokalemia/hyperkalemia/hypomagnesemia -Potassium noted at 3.2 and subsequently at 6.4 on 11/02/2021. -Potassium currently at 4.1. -Magnesium noted at 1.6 magnesium repleted and currently at 2.5.    DVT prophylaxis: DVT prophylaxis per orthopedics postop. Code Status: Full Family Communication: Updated patient.  No family at bedside.  Disposition: TBD.  SNF versus home with home health.  Status is: Inpatient Remains inpatient appropriate because: Severity of illness   Consultants:  Orthopedics: Dr.Bokshan 11/03/2021 [facility] urology.  Procedures:  CT left hip 11/02/2021 CT head 11/02/2021 Chest x-ray 11/02/2021 Renal ultrasound 11/03/2021 Plain films of the left elbow 11/02/2021 Plain films of the left hip and pelvis 11/02/2021 Left hip cannulated screw placement per Dr.Bokshan 11/04/2021  Antimicrobials:  Ciprofloxacin  11/02/2021>>>>   Subjective: Patient sitting up in chair.  In good spirits.  Stated work with physical therapy.  Hoping to get Foley catheter out.  Hematuria improving in Foley catheter.   Objective: Vitals:   11/05/21 1546 11/05/21 1559 11/05/21 2010 11/06/21 0400  BP:  132/72 137/74 111/84  Pulse:  96 92 75  Resp:  '18 16 16  '$ Temp:  98.9 F (37.2 C) 98.5 F (36.9 C) 98 F (36.7 C)  TempSrc:  Oral Oral Oral  SpO2: 95% 94% 91% 96%  Weight:      Height:        Intake/Output Summary (Last 24 hours) at 11/06/2021 1226 Last data filed at 11/06/2021 0900 Gross per 24 hour  Intake 120 ml  Output 1000 ml  Net -880 ml    Filed Weights   11/04/21 1418  Weight: 71.5 kg    Examination:  General exam: NAD. Respiratory system: Lungs clear to auscultation bilaterally.  No wheezes, no crackles, no rhonchi.  Fair air movement.  Cardiovascular system: Regular rate rhythm no murmurs rubs or gallops.  No JVD.  No lower extremity edema. Gastrointestinal system: Abdomen is soft, nontender, nondistended, positive bowel sounds.  No rebound.  No guarding.   Central nervous system: Alert and oriented. No focal neurological deficits. Extremities: Left upper extremity in sling. Skin: No rashes, lesions or ulcers Psychiatry: Judgement and insight appear normal. Mood & affect appropriate.     Data Reviewed: I have personally reviewed following labs and imaging studies  CBC: Recent Labs  Lab 11/02/21 1504 11/02/21 1802 11/03/21 0857 11/04/21 0202 11/05/21 0225 11/06/21 0206  WBC 9.3  --  7.3 6.7 4.3 7.3  NEUTROABS 6.9  --  4.3 3.6 3.5 3.5  HGB 13.0 11.9* 10.8* 11.0* 10.7* 9.7*  HCT 41.4 35.0* 34.2* 34.4* 33.7* 30.1*  MCV 78.0*  --  78.8* 77.5* 77.8* 77.8*  PLT 316  --  285 278 334 353     Basic Metabolic Panel: Recent Labs  Lab 10/31/21 0436 11/01/21 0519 11/02/21 1548 11/03/21 0857 11/03/21 1707 11/04/21 0202 11/05/21 0225 11/06/21 0206  NA 139 141   < > 137 137 136 137  138  K 3.8 3.2*   < > 3.7 4.0 3.8 4.3 4.1  CL 105 107   < > 104 103 107 106 107  CO2 26 26   < > '27 26 23 27 24  '$ GLUCOSE 186* 163*   < > 164* 90 143* 247* 225*  BUN 10 9   < > '13 14 13 11 12  '$ CREATININE 0.59 0.70   < > 0.70 0.80 0.76 0.75 0.76  CALCIUM 8.4* 8.6*   < > 8.4* 9.0 7.7* 8.3* 8.3*  MG 1.4* 1.6*  --  1.6*  --  2.5* 1.8  --   PHOS 3.9 3.8  --   --   --   --   --   --    < > = values in this interval not displayed.     GFR: Estimated Creatinine Clearance: 67.3 mL/min (by C-G formula based on SCr of 0.76 mg/dL).  Liver Function Tests: Recent Labs  Lab 10/31/21 0436 11/01/21 0519 11/02/21 1548  AST 13* 14* 42*  ALT '9 12 8  '$ ALKPHOS 60 68 72  BILITOT 0.7 0.8 2.2*  PROT 5.8* 6.2* 6.8  ALBUMIN 2.7* 3.0* 3.0*     CBG: Recent Labs  Lab 11/05/21 2030 11/06/21 0002 11/06/21 0354 11/06/21 0746 11/06/21 1153  GLUCAP 178* 203* 223* 154* 115*      Recent Results (from the past 240 hour(s))  SARS Coronavirus 2 by RT PCR (hospital order, performed in [facility] hospital lab) *cepheid single result test* Anterior Nasal Swab     Status: None   Collection Time: 10/28/21  6:55 PM   Specimen: Anterior Nasal Swab  Result Value Ref Range Status   SARS Coronavirus 2 by RT PCR NEGATIVE NEGATIVE Final    Comment: (NOTE) SARS-CoV-2 target nucleic acids are NOT DETECTED.  The SARS-CoV-2 RNA is generally detectable in upper and lower respiratory specimens during the acute phase of infection. The lowest concentration of SARS-CoV-2 viral copies this assay can detect is 250 copies / mL. A negative result does not preclude SARS-CoV-2 infection and should not be used as the sole basis for treatment or other patient management decisions.  A negative result may occur with improper specimen collection / handling, submission of specimen other than nasopharyngeal swab, presence of viral mutation(s) within the areas targeted by this assay, and inadequate number of viral copies (<250  copies / mL). A negative result must be combined with clinical observations, patient history, and epidemiological information.  Fact Sheet for Patients:   https://www.patel.info/  Fact Sheet for Healthcare Providers: https://hall.com/  This test is not yet approved or  cleared by the [place] FDA and has been authorized for detection and/or diagnosis of SARS-CoV-2 by FDA under an Emergency Use Authorization (EUA).  This EUA will remain in effect (meaning this test can be used) for the duration of the COVID-19 declaration under Section 564(b)(1) of the Act, 21 U.S.C. section 360bbb-3(b)(1),  unless the authorization is terminated or revoked sooner.  Performed at [facility], [address], [place], [place] [address]   Urine Culture     Status: Abnormal   Collection Time: 10/28/21  8:45 PM   Specimen: Urine, Clean Catch  Result Value Ref Range Status   Specimen Description   Final    URINE, CLEAN CATCH Performed at [facility], [address] [place], [place] [address]    Special Requests   Final    NONE Performed at [facility], [address] [place], [place] [address]    Culture MULTIPLE SPECIES PRESENT, SUGGEST RECOLLECTION (A)  Final   Report Status 10/29/2021 FINAL  Final  MRSA Next Gen by PCR, Nasal     Status: None   Collection Time: 10/29/21  2:04 AM   Specimen: Nasal Mucosa; Nasal Swab  Result Value Ref Range Status   MRSA by PCR Next Gen NOT DETECTED NOT DETECTED Final    Comment: (NOTE) The GeneXpert MRSA Assay (FDA approved for NASAL specimens only), is one component of a comprehensive MRSA colonization surveillance program. It is not intended to diagnose MRSA infection nor to guide or monitor treatment for MRSA infections. Test performance is not FDA approved in patients less than 52 years old. Performed at [facility], [place] [address], [place], [place]  [address]   Urine Culture     Status: Abnormal   Collection Time: 11/04/21  7:01 AM   Specimen: Urine, Catheterized  Result Value Ref Range Status   Specimen Description URINE, CATHETERIZED  Final   Special Requests NONE  Final   Culture (A)  Final    <10,000 COLONIES/mL INSIGNIFICANT GROWTH Performed at [facility], [place] [address] [place], [place] [address]    Report Status 11/05/2021 FINAL  Final         Radiology Studies: DG HIP UNILAT WITH PELVIS 2-3 VIEWS LEFT  Result Date: 11/04/2021 CLINICAL DATA:  Intraoperative fluoroscopy for left side cannulated hip pinning. EXAM: DG HIP (WITH OR WITHOUT PELVIS) 2-3V LEFT COMPARISON:  Left hip radiographs 11/03/2021 and 11/02/2021 FINDINGS: Images were performed intraoperatively without the presence of a radiologist. Images demonstrate placement of 3 screws traversing the previously seen femoral neck fracture. Mild inferior displacement of the distal fracture component respect of the proximal fracture component appears similar to prior. No complication is seen. Total fluoroscopy images: 6 Total fluoroscopy time: 79 seconds Total dose: Radiation Exposure Index (as provided by the fluoroscopic device): 14.69 mGy air Kerma Please see intraoperative findings for further detail. IMPRESSION: Intraoperative fluoroscopy for proximal femoral ORIF. Electronically Signed   By: Yvonne Kendall M.D.   On: 11/04/2021 17:09   DG C-Arm 1-60 Min-No Report  Result Date: 11/04/2021 Fluoroscopy was utilized by the requesting physician.  No radiographic interpretation.        Scheduled Meds:  aspirin  325 mg Oral Daily   atorvastatin  40 mg Oral Daily   Chlorhexidine Gluconate Cloth  6 each Topical Daily   ciprofloxacin  500 mg Oral BID   DULoxetine  60 mg Oral Daily   gabapentin  900 mg Oral QHS   insulin aspart  0-15 Units Subcutaneous TID WC   insulin aspart  3 Units Subcutaneous TID WC   insulin detemir  18 Units Subcutaneous QHS    senna-docusate  1 tablet Oral BID   vitamin B-12  1,000 mcg Oral Daily   Continuous Infusions:  LOS: 4 days    Time spent: 40 minutes    Irine Seal, MD [facility]   To contact the attending provider between 7A-7P or the covering provider during after hours 7P-7A, please log into the web site [email or web address] and access using universal [place] password for that web site. If you do not have the password, please call the hospital operator.  11/06/2021, 12:26 PM

## 2021-11-06 NOTE — Evaluation (Signed)
Occupational Therapy Evaluation Patient Details Name: Laura Gentry MRN: [id number] DOB: [birth year] Today's Date: 11/06/2021   History of Present Illness Laura Gentry is a 65 y.o. female who presents with a fall on the Friday prior to hospitalization.  She was admitted to [facility] long for treatment of an obstructing kidney stone.  She did have a fall that Friday night and was subsequently having pain and difficulty ambulating following this.  She was initially discharged and re-presented to the emergency room with ongoing left hip and groin pain.  X-rays were obtained which found an impacted femoral neck fracture.  She is having difficulty walking.  She does not use a cane or walker at baseline.  Patient also has L radial head fx, no surgical intervention. WBAT for L UE and LE.   Clinical Impression   Pt was independent prior to admission. Lives with her mom and sister in a multilevel townhome. Pt presents with 1/10 pain in L hip and impaired standing balance. She completed bed mobility with supervision, using her UEs to manage her L LE, stood and ambulated with RW and min guard assist. Pt needs set up to min assist for ADLs. Educated in multiple uses of 3 in 1, has access to one at home, and tub transfer avoiding stepping over edge of tub. Pt will benefit from education in use of AE for LB ADLs. Likely to progress well.      Recommendations for follow up therapy are one component of a multi-disciplinary discharge planning process, led by the attending physician.  Recommendations may be updated based on patient status, additional functional criteria and insurance authorization.   Follow Up Recommendations  Home health OT    Assistance Recommended at Discharge Intermittent Supervision/Assistance  Patient can return home with the following A little help with walking and/or transfers;A little help with bathing/dressing/bathroom;Assistance with cooking/housework;Assist for transportation;Help  with stairs or ramp for entrance    Functional Status Assessment  Patient has had a recent decline in their functional status and demonstrates the ability to make significant improvements in function in a reasonable and predictable amount of time.  Equipment Recommendations  None recommended by OT    Recommendations for Other Services       Precautions / Restrictions Precautions Precautions: Fall Restrictions Weight Bearing Restrictions: No LUE Weight Bearing: Weight bearing as tolerated LLE Weight Bearing: Weight bearing as tolerated      Mobility Bed Mobility Overal bed mobility: Needs Assistance Bed Mobility: Supine to Sit           General bed mobility comments: pt managed L LE with her hands, educated in use of gait belt to help L LE in and out of bed    Transfers Overall transfer level: Needs assistance Equipment used: Rolling walker (2 wheels) Transfers: Sit to/from Stand Sit to Stand: Min guard           General transfer comment: cues for hand placement and technique, min guard for safety      Balance Overall balance assessment: Needs assistance   Sitting balance-Leahy Scale: Good     Standing balance support: Bilateral upper extremity supported, During functional activity, Reliant on assistive device for balance Standing balance-Leahy Scale: Poor Standing balance comment: reliant on RW                           ADL either performed or assessed with clinical judgement   ADL Overall ADL's : Needs assistance/impaired  Eating/Feeding: Independent;Sitting   Grooming: Min guard;Standing;Wash/dry hands   Upper Body Bathing: Set up;Sitting   Lower Body Bathing: Minimal assistance;Sitting/lateral leans   Upper Body Dressing : Set up;Sitting   Lower Body Dressing: Minimal assistance;Sitting/lateral leans;Sit to/from stand   Toilet Transfer: Min guard;Ambulation;Rolling walker (2 wheels) Toilet Transfer Details (indicate cue type and  reason): educated in use of 3 in 1 at night beside bed and over toilet during the day if necessary [place] and Hygiene: Min guard;Sit to/from stand       Functional mobility during ADLs: Min guard;Rolling walker (2 wheels)       Vision Baseline Vision/History: 0 No visual deficits       Perception     Praxis      Pertinent Vitals/Pain Pain Assessment Pain Assessment: 0-10 Pain Score: 1  Pain Location: L LE Pain Descriptors / Indicators: Discomfort, Sore Pain Intervention(s): Premedicated before session, Repositioned     Hand Dominance Right   Extremity/Trunk Assessment Upper Extremity Assessment Upper Extremity Assessment: Overall WFL for tasks assessed   Lower Extremity Assessment Lower Extremity Assessment: Defer to PT evaluation   Cervical / Trunk Assessment Cervical / Trunk Assessment: Normal   Communication Communication Communication: No difficulties   Cognition Arousal/Alertness: Awake/alert Behavior During Therapy: WFL for tasks assessed/performed Overall Cognitive Status: Within Functional Limits for tasks assessed                                 General Comments: pt tearful at times, sister and mom want her to go to rehab     General Comments       Exercises     Shoulder Instructions      Home Living Family/patient expects to be discharged to:: Private residence Living Arrangements: Parent;Other relatives (sister and mom) Available Help at Discharge: Family;Available 24 hours/day Type of Home: House Home Access: Stairs to enter [employer] of Steps: 5 total   Home Layout: Multi-level;1/2 bath on main level Alternate Level Stairs-Number of Steps: flight   Bathroom Shower/Tub: Teacher, early years/pre: Standard     Home Equipment: Conservation officer, nature (2 wheels);Shower seat - built in;Hand held shower head;Grab bars - tub/shower (has access to a 3 in 1)          Prior  Functioning/Environment Prior Level of Function : Independent/Modified Independent                        OT Problem List: Impaired balance (sitting and/or standing);Decreased knowledge of use of DME or AE      OT Treatment/Interventions: Self-care/ADL training;DME and/or AE instruction;Patient/family education;Balance training;Therapeutic activities    OT Goals(Current goals can be found in the care plan section) Acute Rehab OT Goals OT Goal Formulation: With patient Time For Goal Achievement: 11/20/21 Potential to Achieve Goals: Good ADL Goals Pt Will Perform Grooming: with modified independence;standing Pt Will Perform Lower Body Bathing: with modified independence;with adaptive equipment;sit to/from stand Pt Will Perform Lower Body Dressing: with modified independence;sit to/from stand;with adaptive equipment Pt Will Transfer to Toilet: with modified independence;bedside commode;ambulating Pt Will Perform Toileting - Clothing Manipulation and hygiene: with modified independence;sit to/from stand Pt Will Perform Tub/Shower Transfer: with supervision;ambulating;shower seat;rolling walker (lateral transfer)  OT Frequency: Min 2X/week    Co-evaluation              AM-PAC OT "6 Clicks" Daily Activity  Outcome Measure Help from another person eating meals?: None Help from another person taking care of personal grooming?: A Little Help from another person toileting, which includes using toliet, bedpan, or urinal?: A Little Help from another person bathing (including washing, rinsing, drying)?: A Little Help from another person to put on and taking off regular upper body clothing?: None Help from another person to put on and taking off regular lower body clothing?: A Little 6 Click Score: 20   End of Session Equipment Utilized During Treatment: Gait belt;Rolling walker (2 wheels)  Activity Tolerance: Patient tolerated treatment well Patient left: in chair;with call  bell/phone within reach;with chair alarm set  OT Visit Diagnosis: Unsteadiness on feet (R26.81);Other abnormalities of gait and mobility (R26.89)                Time: 6060-0459 OT Time Calculation (min): 46 min Charges:  OT General Charges $OT Visit: 1 Visit OT Evaluation $OT Eval Moderate Complexity: 1 Mod OT Treatments $Self Care/Home Management : 23-37 mins  Cleta Alberts, OTR/L Acute Rehabilitation Services Office: [phone]   Malka So 11/06/2021, 11:00 AM

## 2021-11-06 NOTE — Progress Notes (Signed)
Physical Therapy Treatment Patient Details Name: Laura Gentry MRN: [id number] DOB: 12/25/1955 Today's Date: 11/06/2021   History of Present Illness [name] is a 66 y.o. female who presents with a fall on the Friday prior to hospitalization.  She was admitted to [facility] long for treatment of an obstructing kidney stone.  She did have a fall that Friday night and was subsequently having pain and difficulty ambulating following this.  She was initially discharged and re-presented to the emergency room with ongoing left hip and groin pain.  X-rays were obtained which found an impacted femoral neck fracture.  She is having difficulty walking.  She does not use a cane or walker at baseline.  Patient also has L radial head fx, no surgical intervention. WBAT for L UE and LE.    PT Comments    Pt progressing towards physical therapy goals. Was able to perform transfers and ambulation with gross min guard assist and RW for support. Focus of session was stair training, initiation of HEP, and firming up d/c plan. At the request of the pt, PT spoke with pt and pt's sister on speaker phone regarding recommendation for return home at d/c. Pt's sister planning to be present for PT session tomorrow for education, and to observe pt mobilizing. Anticipate pt will be ready for d/c from a PT perspective after tomorrow's session. Pt reports her mother will be at home with her, and although will not be able to provide physical assistance, she is independent and can provide supervision as needed. Will continue to follow and progress as able per POC.   Recommendations for follow up therapy are one component of a multi-disciplinary discharge planning process, led by the attending physician.  Recommendations may be updated based on patient status, additional functional criteria and insurance authorization.  Follow Up Recommendations  Home health PT     Assistance Recommended at Discharge Frequent or constant  Supervision/Assistance  Patient can return home with the following A lot of help with walking and/or transfers;A little help with bathing/dressing/bathroom;Assist for transportation;Help with stairs or ramp for entrance   Equipment Recommendations  None recommended by PT    Recommendations for Other Services       Precautions / Restrictions Precautions Precautions: Fall Restrictions Weight Bearing Restrictions: No LUE Weight Bearing: Weight bearing as tolerated LLE Weight Bearing: Weight bearing as tolerated     Mobility  Bed Mobility               General bed mobility comments: Pt was received sitting up in the recliner chair.    Transfers Overall transfer level: Needs assistance Equipment used: Rolling walker (2 wheels) Transfers: Sit to/from Stand Sit to Stand: Min guard           General transfer comment: Increased time due to lines. Pt was able to transition to/from standing with close guard for safety. VC's for hand placement on seated surface.    Ambulation/Gait Ambulation/Gait assistance: Min guard Gait Distance (Feet): 75 Feet Assistive device: Rolling walker (2 wheels) Gait Pattern/deviations: Antalgic, Step-through pattern, Decreased stance time - left, Decreased weight shift to left, Trunk flexed Gait velocity: Decreased Gait velocity interpretation: 1.31 - 2.62 ft/sec, indicative of limited community ambulator   General Gait Details: Rushing at times but able to settle into a consistent cadence and fluid gait pattern. No overt LOB noted.   Stairs Stairs: Yes Stairs assistance: Min guard Stair Management: One rail Right, One rail Left, Step to pattern, Forwards Number of  Stairs: 10 General stair comments: VC's for sequencing and general safety. Hands on guarding but pt mostly relying on railing for support. Practiced x10 stairs facing forwards and x3 stairs sideways.   Wheelchair Mobility    Modified Rankin (Stroke Patients Only)        Balance Overall balance assessment: Needs assistance Sitting-balance support: Feet supported Sitting balance-Leahy Scale: Good     Standing balance support: Bilateral upper extremity supported, During functional activity, Reliant on assistive device for balance Standing balance-Leahy Scale: Poor Standing balance comment: reliant on RW                            Cognition Arousal/Alertness: Awake/alert Behavior During Therapy: WFL for tasks assessed/performed Overall Cognitive Status: Within Functional Limits for tasks assessed                                          Exercises General Exercises - Lower Extremity Quad Sets: 10 reps Short Arc Quad: 10 reps Long Arc Quad: 10 reps Hip ABduction/ADduction: 10 reps    General Comments        Pertinent Vitals/Pain Pain Assessment Pain Assessment: Faces Faces Pain Scale: Hurts little more Pain Location: L LE Pain Descriptors / Indicators: Discomfort, Sore Pain Intervention(s): Limited activity within patient's tolerance, Monitored during session, Repositioned    Home Living                          Prior Function            PT Goals (current goals can now be found in the care plan section) Acute Rehab PT Goals Patient Stated Goal: Return home, not go to a nursing home PT Goal Formulation: With patient Time For Goal Achievement: 11/12/21 Potential to Achieve Goals: Good Progress towards PT goals: Progressing toward goals    Frequency    Min 4X/week      PT Plan Current plan remains appropriate    Co-evaluation              AM-PAC PT "6 Clicks" Mobility   Outcome Measure  Help needed turning from your back to your side while in a flat bed without using bedrails?: A Little Help needed moving from lying on your back to sitting on the side of a flat bed without using bedrails?: A Little Help needed moving to and from a bed to a chair (including a wheelchair)?: A  Little Help needed standing up from a chair using your arms (e.g., wheelchair or bedside chair)?: A Little Help needed to walk in hospital room?: A Little Help needed climbing 3-5 steps with a railing? : A Little 6 Click Score: 18    End of Session Equipment Utilized During Treatment: Gait belt Activity Tolerance: Patient tolerated treatment well Patient left: in chair;with call bell/phone within reach;with chair alarm set Nurse Communication: Mobility status PT Visit Diagnosis: Other abnormalities of gait and mobility (R26.89);Pain;Unsteadiness on feet (R26.81);History of falling (Z91.81) Pain - Right/Left: Left Pain - part of body: Leg     Time: 5638-7564 PT Time Calculation (min) (ACUTE ONLY): 57 min  Charges:  $Gait Training: 23-37 mins $Therapeutic Exercise: 8-22 mins $Therapeutic Activity: 8-22 mins                     Rolinda Roan,  PT, DPT Acute Rehabilitation Services Secure Chat Preferred Office: [phone]    Thelma Comp 11/06/2021, 3:42 PM

## 2021-11-06 NOTE — TOC Progression Note (Signed)
Transition of Care [facility]) - Progression Note    Patient Details  Name: Laura Gentry MRN: [id number] Date of Birth: July 17, 1955  Transition of [facility]) CM/SW Contact  Graves-Bigelow, Ocie Cornfield, RN Phone Number: 11/06/2021, 4:03 PM  Clinical Narrative:  Case Manager received notification that the patient wants to return home with home health services. Patient states she lives with her sister and mother. Patient has durable medical equipment rolling walker, shower chair, and bedside commode in the home. No DME needs identified at the time of conversation. Patient states her sister works during the day and her mother is in the home. Case Manager did discuss the recommendation for constant frequent supervision-per patient her sister will get her downstairs prior to her leaving work and she will stay downstairs until she gets home. Patient is aware that her mother will not be able to assist during the day only capable of dialing 911 if anything was to occur. Patient is agreeable to home health services- no preference for agency. Patient is agreeable to [facility]- referral submitted and start of care to begin within 24-48 hours post transition home. Patient will need HH PT/OT orders and F2F.   Expected Discharge Plan: [place] Barriers to Discharge: No Barriers Identified  Expected Discharge Plan and Services Expected Discharge Plan: [place] In-house Referral: Clinical Social Work Discharge Planning Services: CM Consult Post Acute Care Choice: [place] arrangements for the past 2 months: Single Family Home                 DME Arranged: N/A DME Agency: NA       HH Arranged: PT, OT HH Agency: [place] Date [place]: 11/06/21 Time [place]: 1602 Representative spoke with at [place]: Tommi Rumps  Readmission Risk Interventions     No data to display

## 2021-11-07 LAB — BASIC METABOLIC PANEL
Anion gap: 11 (ref 5–15)
BUN: 10 mg/dL (ref 8–23)
CO2: 27 mmol/L (ref 22–32)
Calcium: 8.5 mg/dL — ABNORMAL LOW (ref 8.9–10.3)
Chloride: 101 mmol/L (ref 98–111)
Creatinine, Ser: 0.8 mg/dL (ref 0.44–1.00)
GFR, Estimated: >60 mL/min (ref >60)
Glucose, Bld: 272 mg/dL — ABNORMAL HIGH (ref 70–99)
Potassium: 3.8 mmol/L (ref 3.5–5.1)
Sodium: 139 mmol/L (ref 135–145)

## 2021-11-07 LAB — CBC WITH DIFFERENTIAL/PLATELET
Abs Immature Granulocytes: 0.01 10*3/uL (ref 0.00–0.07)
Basophils Absolute: 0.1 10*3/uL (ref 0.0–0.1)
Basophils Relative: 1 %
Eosinophils Absolute: 0.3 10*3/uL (ref 0.0–0.5)
Eosinophils Relative: 4 %
HCT: 31.4 % — ABNORMAL LOW (ref 36.0–46.0)
Hemoglobin: 9.8 g/dL — ABNORMAL LOW (ref 12.0–15.0)
Immature Granulocytes: 0 %
Lymphocytes Relative: 43 %
Lymphs Abs: 3 10*3/uL (ref 0.7–4.0)
MCH: 24.3 pg — ABNORMAL LOW (ref 26.0–34.0)
MCHC: 31.2 g/dL (ref 30.0–36.0)
MCV: 77.9 fL — ABNORMAL LOW (ref 80.0–100.0)
Monocytes Absolute: 0.6 10*3/uL (ref 0.1–1.0)
Monocytes Relative: 9 %
Neutro Abs: 3 10*3/uL (ref 1.7–7.7)
Neutrophils Relative %: 43 %
Platelets: 356 10*3/uL (ref 150–400)
RBC: 4.03 MIL/uL (ref 3.87–5.11)
RDW: 14.3 % (ref 11.5–15.5)
WBC: 6.9 10*3/uL (ref 4.0–10.5)
nRBC: 0 % (ref 0.0–0.2)

## 2021-11-07 LAB — GLUCOSE, CAPILLARY
Glucose-Capillary: 220 mg/dL — ABNORMAL HIGH (ref 70–99)
Glucose-Capillary: 130 mg/dL — ABNORMAL HIGH (ref 70–99)

## 2021-11-07 MED ORDER — ASPIRIN 325 MG PO TABS: 325.0000 mg | ORAL_TABLET | Freq: Every day | ORAL | 0 refills | Status: AC

## 2021-11-07 MED ORDER — PANTOPRAZOLE SODIUM 40 MG PO TBEC: 40.0000 mg | DELAYED_RELEASE_TABLET | Freq: Every day | ORAL | 0 refills | Status: DC

## 2021-11-07 MED ORDER — OXYCODONE-ACETAMINOPHEN 7.5-325 MG PO TABS: 1.0000 | ORAL_TABLET | Freq: Four times a day (QID) | ORAL | 0 refills | Status: AC | PRN

## 2021-11-07 NOTE — Progress Notes (Signed)
Occupational Therapy Treatment Patient Details Name: Laura Gentry MRN: [id number] DOB: June 02, 1955 Today's Date: 11/07/2021   History of present illness Laura Gentry is a 66 y.o. female who presents with a fall on the Friday prior to hospitalization.  She was admitted to [facility] long for treatment of an obstructing kidney stone.  She did have a fall that Friday night and was subsequently having pain and difficulty ambulating following this.  She was initially discharged and re-presented to the emergency room with ongoing left hip and groin pain.  X-rays were obtained which found an impacted femoral neck fracture.  She is having difficulty walking.  She does not use a cane or walker at baseline.  Patient also has L radial head fx, no surgical intervention. WBAT for L UE and LE.   OT comments  Focus of session on educating pt in compensatory strategies for LB ADL, including use of AE. Pt able to return demonstration. Pt completed bed mobility with increased time and using R LE to assist L back into bed for foley to be removed. Pt with one LOB in standing with RW, but did not require assist to correct. Reinforced tub transfer, avoiding stepping over edge of tub. Pt agreeable to having supervision with tub transfer.    Recommendations for follow up therapy are one component of a multi-disciplinary discharge planning process, led by the attending physician.  Recommendations may be updated based on patient status, additional functional criteria and insurance authorization.    Follow Up Recommendations  Home health OT    Assistance Recommended at Discharge Intermittent Supervision/Assistance  Patient can return home with the following  A little help with walking and/or transfers;A little help with bathing/dressing/bathroom;Assistance with cooking/housework;Assist for transportation;Help with stairs or ramp for entrance   Equipment Recommendations  None recommended by OT    Recommendations for  Other Services      Precautions / Restrictions Precautions Precautions: Fall Restrictions Weight Bearing Restrictions: No LUE Weight Bearing: Weight bearing as tolerated LLE Weight Bearing: Weight bearing as tolerated       Mobility Bed Mobility Overal bed mobility: Modified Independent             General bed mobility comments: HOB flat, increased time, able to get L LE OOB without UE use or gait belt, hooked R LE under L to assist it back into bed    Transfers Overall transfer level: Needs assistance Equipment used: Rolling walker (2 wheels) Transfers: Sit to/from Stand Sit to Stand: Min guard           General transfer comment: pt recalled hand placement without cues     Balance Overall balance assessment: Needs assistance Sitting-balance support: Feet supported Sitting balance-Leahy Scale: Good     Standing balance support: Bilateral upper extremity supported, During functional activity, Reliant on assistive device for balance Standing balance-Leahy Scale: Poor Standing balance comment: reliant on RW                           ADL either performed or assessed with clinical judgement   ADL Overall ADL's : Needs assistance/impaired               Lower Body Bathing Details (indicate cue type and reason): recommended long handled bath sponge, instructed in use of reacher to dry LEs     Lower Body Dressing: Min guard;Sit to/from stand;Sitting/lateral leans Lower Body Dressing Details (indicate cue type and reason): educated in use  of reacher and sock aide             Functional mobility during ADLs: Min guard;Rolling walker (2 wheels) General ADL Comments: Pt with one LOB, but did not need assist to regain balance in standing.    Extremity/Trunk Assessment              Vision       Perception     Praxis      Cognition Arousal/Alertness: Awake/alert Behavior During Therapy: WFL for tasks assessed/performed Overall  Cognitive Status: Within Functional Limits for tasks assessed                                          Exercises      Shoulder Instructions       General Comments      Pertinent Vitals/ Pain       Pain Assessment Pain Assessment: Faces Faces Pain Scale: Hurts a little bit Pain Location: L wrist Pain Descriptors / Indicators: Sore Pain Intervention(s): Monitored during session  Home Living                                          Prior Functioning/Environment              Frequency  Min 2X/week        Progress Toward Goals  OT Goals(current goals can now be found in the care plan section)  Progress towards OT goals: Progressing toward goals  Acute Rehab OT Goals OT Goal Formulation: With patient Time For Goal Achievement: 11/20/21 Potential to Achieve Goals: Good  Plan Discharge plan remains appropriate    Co-evaluation                 AM-PAC OT "6 Clicks" Daily Activity     Outcome Measure   Help from another person eating meals?: None Help from another person taking care of personal grooming?: A Little Help from another person toileting, which includes using toliet, bedpan, or urinal?: A Little Help from another person bathing (including washing, rinsing, drying)?: A Little Help from another person to put on and taking off regular upper body clothing?: None Help from another person to put on and taking off regular lower body clothing?: A Little 6 Click Score: 20    End of Session Equipment Utilized During Treatment: Rolling walker (2 wheels)  OT Visit Diagnosis: Unsteadiness on feet (R26.81);Other abnormalities of gait and mobility (R26.89)   Activity Tolerance Patient tolerated treatment well   Patient Left in bed;with call bell/phone within reach;Other (comment) (RN to take out foley)   Nurse Communication          Time: [phone] OT Time Calculation (min): 25 min  Charges: OT General  Charges $OT Visit: 1 Visit OT Treatments $Self Care/Home Management : 23-37 mins  Cleta Alberts, OTR/L Acute Rehabilitation Services Office: [phone]  Malka So 11/07/2021, 9:55 AM

## 2021-11-07 NOTE — TOC Transition Note (Signed)
Transition of Care [facility]) - CM/SW Discharge Note   Patient Details  Name: Laura Gentry MRN: [id number] Date of Birth: 05/21/1956  Transition of Care [facility]) CM/SW Contact:  Bethena Roys, RN Phone Number: 11/07/2021, 11:28 AM   Clinical Narrative: PT just sent a secure chat and patients family wants the patient to have a new bedside commode and rolling walker for home. Case Manager submitted information to [employer] and DME will be delivered to the room prior to transition home. No further needs identified at this time.   Final next level of care: [place] Barriers to Discharge: No Barriers Identified   Patient Goals and CMS Choice Patient states their goals for this hospitalization and ongoing recovery are:: patient wants to return home with family support    Discharge Plan and Services In-house Referral: Clinical Social Work Discharge Planning Services: CM Consult Post Acute Care Choice: Home Health          DME Arranged: N/A DME Agency: NA       HH Arranged: PT, OT HH Agency: [place] Date [place]: 11/06/21 Time [place]: 1602 Representative spoke with at [place]: Tommi Rumps     Readmission Risk Interventions     No data to display

## 2021-11-07 NOTE — Progress Notes (Signed)
   Subjective:  Patient reports pain as mild. Was able to navigate stairs with PT.  Objective:   VITALS:   Vitals:   11/06/21 0400 11/06/21 1436 11/06/21 2000 11/07/21 0400  BP: 111/84 134/82 (!) 159/84 121/66  Pulse: 75 97 97 90  Resp: '16 17 20 14  '$ Temp: 98 F (36.7 C) 98.8 F (37.1 C) 98 F (36.7 C) 98.4 F (36.9 C)  TempSrc: Oral Oral Oral Oral  SpO2: 96% 96% 93% 95%  Weight:      Height:        Surgical dressing over left hip is clean dry and intact.  She is able to extend and flex at all the toes of the left foot.  Fires EHL.  2+ dorsalis pedis pulse.  Lab Results  Component Value Date   WBC 6.9 11/07/2021   HGB 9.8 (L) 11/07/2021   HCT 31.4 (L) 11/07/2021   MCV 77.9 (L) 11/07/2021   PLT 356 11/07/2021     Assessment/Plan:  3 Days Post-Op status post left hip cannulated screw placement doing well.  - Expected postop acute blood loss anemia - will monitor for symptoms - Patient to work with PT/OT to optimize mobilization safely - DVT ppx - SCDs, ambulation, Lovenox - WBAT operative extremity - Pain control - multimodal pain management, ATC acetaminophen in conjunction with as needed narcotic (oxycodone), although this should be minimized with other modalities    Tyler Robidoux 11/07/2021, 7:17 AM

## 2021-11-07 NOTE — Inpatient Diabetes Management (Signed)
Inpatient Diabetes Program Recommendations  AACE/ADA: New Consensus Statement on Inpatient Glycemic Control   Target Ranges:  Prepandial:   less than 140 mg/dL      Peak postprandial:   less than 180 mg/dL (1-2 hours)      Critically ill patients:  140 - 180 mg/dL    Latest Reference Range & Units 11/06/21 07:46 11/06/21 11:53 11/06/21 16:27 11/06/21 20:40  Glucose-Capillary 70 - 99 mg/dL 154 (H) 115 (H) 245 (H) 220 (H)   Review of Glycemic Control  Diabetes history: DM2 Outpatient Diabetes medications: Lantus 25 units QHS, Invokamet [phone] mg QAM Current orders for Inpatient glycemic control: Levemir 22 units QHS, Novolog 0-15 units TID with meals, Novolog 3 units TID with meals  Inpatient Diabetes Program Recommendations:    Insulin: Noted Levemir increased from 18 to 22 units today. Please consider adding Novolog 0-5 units QHS for bedtime correction.  Thanks, Barnie Alderman, RN, MSN, [facility] Coordinator Inpatient Diabetes Program [phone] (Team Pager from 8am to [place])

## 2021-11-07 NOTE — Progress Notes (Signed)
Physical Therapy Treatment Patient Details Name: Laura Gentry MRN: [id number] DOB: 04-Mar-1956 Today's Date: 11/07/2021   History of Present Illness Theodora Lalanne is a 66 y.o. female who presents with a fall on the Friday prior to hospitalization.  She was admitted to [facility] long for treatment of an obstructing kidney stone.  She did have a fall that Friday night and was subsequently having pain and difficulty ambulating following this.  She was initially discharged and re-presented to the emergency room with ongoing left hip and groin pain.  X-rays were obtained which found an impacted femoral neck fracture.  She is having difficulty walking.  She does not use a cane or walker at baseline.  Patient also has L radial head fx, no surgical intervention. WBAT for L UE and LE.    PT Comments    Pt progressing well with post-op mobility. Sister present and engaged throughout session. Education provided for safe home management when sister is at work, and encouraged pt to wait for sister to be present to supervise stair negotiation and tub transfer. Reinforced HEP and pt/sister report no further questions. Pt is safe for d/c home from a PT perspective, however will continue to work towards acute PT goals until pt is discharged home.     Recommendations for follow up therapy are one component of a multi-disciplinary discharge planning process, led by the attending physician.  Recommendations may be updated based on patient status, additional functional criteria and insurance authorization.  Follow Up Recommendations  Home health PT     Assistance Recommended at Discharge Frequent or constant Supervision/Assistance  Patient can return home with the following A lot of help with walking and/or transfers;A little help with bathing/dressing/bathroom;Assist for transportation;Help with stairs or ramp for entrance   Equipment Recommendations  Rolling walker (2 wheels);BSC/3in1    Recommendations  for Other Services       Precautions / Restrictions Precautions Precautions: Fall Precaution Comments: Decreased safety awareness at times Restrictions Weight Bearing Restrictions: No LUE Weight Bearing: Weight bearing as tolerated LLE Weight Bearing: Weight bearing as tolerated     Mobility  Bed Mobility               General bed mobility comments: Pt was received sitting up in the recliner chair.    Transfers Overall transfer level: Needs assistance Equipment used: Rolling walker (2 wheels) Transfers: Sit to/from Stand Sit to Stand: Min guard           General transfer comment: Pt demonstrated proper hand placement on seated surface for safety. Increased time to power up to full stand but no assist required.    Ambulation/Gait Ambulation/Gait assistance: Min guard Gait Distance (Feet): 150 Feet Assistive device: Rolling walker (2 wheels) Gait Pattern/deviations: Antalgic, Step-through pattern, Decreased stance time - left, Decreased weight shift to left, Trunk flexed Gait velocity: Decreased Gait velocity interpretation: 1.31 - 2.62 ft/sec, indicative of limited community ambulator   General Gait Details: VC's for even cadence and equal step/stride length. Pt continues to rush at times but appears more comfortable with walker management this session.   Stairs Stairs: Yes Stairs assistance: Min guard Stair Management: One rail Right, One rail Left, Step to pattern, Forwards Number of Stairs: 10 General stair comments: VC's for sequencing and general safety. Hands on guarding but pt mostly relying on railing for support. Practiced x10 stairs facing forwards and x10 facing sideways with sister present for education   Wheelchair Mobility    Modified Rankin (Stroke  Patients Only)       Balance Overall balance assessment: Needs assistance Sitting-balance support: Feet supported Sitting balance-Leahy Scale: Good     Standing balance support: Bilateral  upper extremity supported, During functional activity, Reliant on assistive device for balance Standing balance-Leahy Scale: Poor Standing balance comment: reliant on RW                            Cognition Arousal/Alertness: Awake/alert Behavior During Therapy: WFL for tasks assessed/performed Overall Cognitive Status: Within Functional Limits for tasks assessed                                          Exercises General Exercises - Lower Extremity Quad Sets: 10 reps Short Arc Quad: 10 reps Long Arc Quad: 10 reps Heel Slides: 10 reps Hip ABduction/ADduction: 10 reps    General Comments        Pertinent Vitals/Pain Pain Assessment Pain Assessment: Faces Faces Pain Scale: Hurts a little bit Pain Location: hip Pain Descriptors / Indicators: Sore, Operative site guarding Pain Intervention(s): Limited activity within patient's tolerance, Monitored during session, Repositioned    Home Living                          Prior Function            PT Goals (current goals can now be found in the care plan section) Acute Rehab PT Goals Patient Stated Goal: Return home, not go to a nursing home PT Goal Formulation: With patient Time For Goal Achievement: 11/12/21 Potential to Achieve Goals: Good Progress towards PT goals: Progressing toward goals    Frequency    Min 4X/week      PT Plan Current plan remains appropriate    Co-evaluation              AM-PAC PT "6 Clicks" Mobility   Outcome Measure  Help needed turning from your back to your side while in a flat bed without using bedrails?: A Little Help needed moving from lying on your back to sitting on the side of a flat bed without using bedrails?: A Little Help needed moving to and from a bed to a chair (including a wheelchair)?: A Little Help needed standing up from a chair using your arms (e.g., wheelchair or bedside chair)?: A Little Help needed to walk in hospital  room?: A Little Help needed climbing 3-5 steps with a railing? : A Little 6 Click Score: 18    End of Session Equipment Utilized During Treatment: Gait belt Activity Tolerance: Patient tolerated treatment well Patient left: in chair;with call bell/phone within reach;with chair alarm set Nurse Communication: Mobility status PT Visit Diagnosis: Other abnormalities of gait and mobility (R26.89);Pain;Unsteadiness on feet (R26.81);History of falling (Z91.81) Pain - Right/Left: Left Pain - part of body: Leg     Time: 1035-1100 PT Time Calculation (min) (ACUTE ONLY): 25 min  Charges:  $Gait Training: 8-22 mins $Therapeutic Exercise: 8-22 mins                     Rolinda Roan, PT, DPT Acute Rehabilitation Services Secure Chat Preferred Office: [place] 11/07/2021, 1:33 PM

## 2021-11-07 NOTE — Discharge Summary (Signed)
Physician Discharge Summary  Laura Gentry IEP:[id number] DOB: [birth year] DOA: 11/02/2021  PCP: Seward Carol, MD  Admit date: 11/02/2021 Discharge date: 11/07/2021  Admitted From: Home Discharge disposition: Home with home health PT  Recommendations at discharge:  DVT prophylaxis with aspirin 325 mg daily along with Protonix for 4 weeks Limited use of pain medicines Follow-up with orthopedics as an outpatient  History of Present Illness / Brief narrative:  66 y.o. WF PMHx nephrolithiasis, DM type 2 insulin-dependent, HLD, thyroid disease, migraines, brain aneurysm, lumbosacral radiculopathy 6/6-6/10, patient was hospitalized for nausea, vomiting, pain due to obstructive right kidney stone, underwent cystoscopy and bilateral stent placement and discharged to home.  Currently had a fall while in the hospital on the night of 6/9 which she did not report to anybody.  Her feet reportedly slipped out from under her and she landed on the right side.  She had tolerable pain and hence did not report anybody. Post discharge, her pain got worse and she was not able to walk.. 6/11, patient presented to the ED again with complaint of pain in the left hip and left elbow.  Patient reports having no recurrence of fall at home. X-rays were obtained which showed impacted femoral neck fracture. Seen by orthopedics. 6/13, underwent left hip cannulated screw placement. See below for details.  Subjective:  Seen and examined this morning.  Pleasant elderly Caucasian female.  Sitting up in chair.  Not in distress. She states that she intermittently gets blood in the urine because of nephrolithiasis.  Hospital Course:  Left impacted femoral neck fracture -Sustained after a fall which patient reports happened during last hospitalization. -Status post left hip cannulation and screw placement by orthopedics -Pain management with as needed oxycodone -Per orthopedics, DVT prophylaxis with aspirin 325 mg  daily for 4 weeks.  I would combine with Protonix for the duration. -Seen by PT.  Home with PT recommended.  Patient refused to go to SNF.  DME ordered.  Left elbow fracture -Secondary to mechanical fall. -Recommended nonoperative management. -Sling per orthopedics.  Acute urinary retention/hematuria History of nephrolithiasis Recent bilateral stent placement -6/12, patient was noted to have decreased urine output.  Likely secondary to pain from hip fracture, poor mobility and use of pain medicines.  Renal ultrasound showed left-sided hydronephrosis. -Per previous hospitalist, case was discussed with urology.  Foley catheter was placed.  Voiding trial done today.  Patient was able to void.  Postvoid residual 0. -Hematuria is likely because of nephrolithiasis, recent stent placement and Foley catheterization. -Hematuria resolving.  Hemoglobin stable. -Continue to follow-up with urology as an outpatient.  who are recommending placement of Foley catheter for a week to allow bladder to recover and outpatient follow-up with urology for voiding trial or catheter may be removed at the rehab in the week.  Severe sepsis/complicated UTI -Noted during last hospitalization as patient noted with a leukocytosis, tachycardia, elevated lactic acid level. -Continue antibiotics to complete a 14-day course on 6/20 as suggested in last hospitalization.  Diabetes mellitus type 2 with hyperglycemia -Hemoglobin A1c 8.8 (10/29/2021) -Continue previous insulin regimen of insulin.  Okay to resume Invokamet..  Recommend strict compliance to insulin and dietary restrictions. -Continue Neurontin for neuropathy.  Hypertension -Currently controlled, not on any antihypertensive medications at this time. -Blood pressure uptrending.  Okay to resume propanolol at home. -Follow-up as an outpatient.  Hypothyroidism -6/7 TSH 3.4 within normal limits -Patient not on any thyroid medication prior to admission. -Per prior  physician patient noted to have  stated she takes natural hormone replacement.  Anemia/postop acute blood loss anemia -Baseline hemoglobin close to 11.  Hemoglobin is running low postop but remains more than 9.    Recent Labs    11/03/21 0857 11/04/21 0202 11/05/21 0225 11/06/21 0206 11/07/21 0115  HGB 10.8* 11.0* 10.7* 9.7* 9.8*  MCV 78.8* 77.5* 77.8* 77.8* 77.9*  VITAMINB12  --   --  1,687*  --   --   FOLATE  --   --  20.9  --   --   FERRITIN  --   --  44  --   --   TIBC  --   --  263  --   --   IRON  --   --  15*  --   --    Goals of care - -  Code Status: Prior full code  Nutritional status:  Body mass index is 24.69 kg/m.      Diet:  Diet Order             Diet Carb Modified           Diet regular Room service appropriate? Yes; Fluid consistency: Thin  Diet effective now                    Wounds:  - Incision (Closed) 11/04/21 Hip Left (Active)  Date First Assessed/Time First Assessed: 11/04/21 1615   Location: Hip  Location Orientation: Left    Assessments 11/04/2021  4:45 PM 11/07/2021  8:39 AM  Dressing Type Silver hydrofiber Silver hydrofiber  Dressing Clean, Dry, Intact Clean, Dry, Intact  Site / Wound Assessment Dressing in place / Unable to assess Dressing in place / Unable to assess  Drainage Amount None --     No associated orders.    Discharge Exam:   Vitals:   11/06/21 1436 11/06/21 2000 11/07/21 0400 11/07/21 0802  BP: 134/82 (!) 159/84 121/66 (!) 146/81  Pulse: 97 97 90 87  Resp: 17 20 14 15   Temp: 98.8 F (37.1 C) 98 F (36.7 C) 98.4 F (36.9 C) 98 F (36.7 C)  TempSrc: Oral Oral Oral   SpO2: 96% 93% 95% 100%  Weight:      Height:        Body mass index is 24.69 kg/m.  General exam: Pleasant, elderly Caucasian female.  Not in physical distress Skin: No rashes, lesions or ulcers. HEENT: Atraumatic, normocephalic, no obvious bleeding Lungs: Clear to auscultation bilaterally CVS: Regular rate and rhythm, no  murmur GI/Abd: soft, nontender, nondistended, bowel sound present CNS: Alert, awake, oriented x3 Psychiatry: Mood appropriate Extremities: No pedal edema, no calf tenderness  Follow ups:    Follow-up Information     Vanetta Mulders, MD Follow up.   Specialty: Orthopedic Surgery Contact information: [place] [address] [phone]         [facility] Follow up.   Specialty: Home Health Services Why: Physical Therapy/ Occupational Therapy-office to call with visit times Contact information: [place] [address] [phone]         [facility] Follow up.   Why: Rolling Walker and Bedside commode- to be delivered to the room prior to transition home. Contact information: [address] [place] [address] [phone]         Seward Carol, MD Follow up.   Specialty: Internal Medicine Contact information: [address] [place] [address] [phone]  Discharge Instructions:   Discharge Instructions     Call MD for:  difficulty breathing, headache or visual disturbances   Complete by: As directed    Call MD for:  extreme fatigue   Complete by: As directed    Call MD for:  hives   Complete by: As directed    Call MD for:  persistant dizziness or light-headedness   Complete by: As directed    Call MD for:  persistant nausea and vomiting   Complete by: As directed    Call MD for:  severe uncontrolled pain   Complete by: As directed    Call MD for:  temperature >100.4   Complete by: As directed    Diet Carb Modified   Complete by: As directed    Discharge instructions   Complete by: As directed    Recommendations at discharge:   DVT prophylaxis with aspirin 325 mg daily along with Protonix for 4 weeks  Limited use of pain medicines  Follow-up with orthopedics as an outpatient  Discharge instructions for diabetes  mellitus: Check blood sugar 3 times a day and bedtime at home. If blood sugar running above 200 or less than 70 please call your MD to adjust insulin. If you notice signs and symptoms of hypoglycemia (low blood sugar) like jitteriness, confusion, thirst, tremor and sweating, please check blood sugar, drink sugary drink/biscuits/sweets to increase sugar level and call MD or return to ER.    General discharge instructions: Follow with Primary MD Seward Carol, MD in 7 days  Please request your PCP  to go over your hospital tests, procedures, radiology results at the follow up. Please get your medicines reviewed and adjusted.  Your PCP may decide to repeat certain labs or tests as needed. Do not drive, operate heavy machinery, perform activities at heights, swimming or participation in water activities or provide baby sitting services if your were admitted for syncope or siezures until you have seen by Primary MD or a Neurologist and advised to do so again. [place] Controlled Substance Reporting System database was reviewed. Do not drive, operate heavy machinery, perform activities at heights, swim, participate in water activities or provide baby-sitting services while on medications for pain, sleep and mood until your outpatient physician has reevaluated you and advised to do so again.  You are strongly recommended to comply with the dose, frequency and duration of prescribed medications. Activity: As tolerated with Full fall precautions use walker/cane & assistance as needed Avoid using any recreational substances like cigarette, tobacco, alcohol, or non-prescribed drug. If you experience worsening of your admission symptoms, develop shortness of breath, life threatening emergency, suicidal or homicidal thoughts you must seek medical attention immediately by calling 911 or calling your MD immediately  if symptoms less severe. You must read complete instructions/literature along with all the  possible adverse reactions/side effects for all the medicines you take and that have been prescribed to you. Take any new medicine only after you have completely understood and accepted all the possible adverse reactions/side effects.  Wear Seat belts while driving. You were cared for by a hospitalist during your hospital stay. If you have any questions about your discharge medications or the care you received while you were in the hospital after you are discharged, you can call the unit and ask to speak with the hospitalist or the covering physician. Once you are discharged, your primary care physician will handle any further medical issues. Please note that NO REFILLS for any discharge  medications will be authorized once you are discharged, as it is imperative that you return to your primary care physician (or establish a relationship with a primary care physician if you do not have one).   Discharge wound care:   Complete by: As directed    Increase activity slowly   Complete by: As directed        Discharge Medications:   Allergies as of 11/07/2021       Reactions   Bee Venom Shortness Of Breath   Penicillins Anaphylaxis   Remote reaction Throat swelling - compromised airway Tolerates ceftriaxone   Pneumococcal Vaccines    Per patient due to Brain Aneurysm   Sulfa Antibiotics Hives, Itching, Swelling   Bactrim [sulfamethoxazole-trimethoprim]    Felt off balance        Medication List     TAKE these medications    Accu-Chek Aviva Plus w/Device Kit The patient IS insulin requiring, ICD 10 code E11.9. The patient tests 4 times per day.   accu-chek soft touch lancets Use as instructed   acetaminophen 500 MG tablet Commonly known as: TYLENOL Take 1,000 mg by mouth every 6 (six) hours as needed for mild pain.   alendronate 70 MG tablet Commonly known as: FOSAMAX Take 70 mg by mouth once a week. Monday   aspirin 325 MG tablet Take 1 tablet (325 mg total) by mouth daily  for 28 days. Start taking on: November 08, 2021   atorvastatin 40 MG tablet Commonly known as: LIPITOR Take 1 tablet (40 mg total) by mouth daily.   ciprofloxacin 500 MG tablet Commonly known as: CIPRO Take 1 tablet (500 mg total) by mouth 2 (two) times daily.   DULoxetine 60 MG capsule Commonly known as: CYMBALTA Take 60 mg by mouth daily.   gabapentin 300 MG capsule Commonly known as: NEURONTIN Take 300 mg in AM, 300 mg at noon, 600 mg at bedtime What changed:  how much to take how to take this when to take this additional instructions   glucose blood test strip Commonly known as: Accu-Chek Aviva Plus Use as instructed. The patient IS insulin requiring, ICD 10 code E11.9. The patient tests 4 times per day.   insulin glargine 100 UNIT/ML Solostar Pen Commonly known as: Lantus SoloStar Inject 25 Units into the skin at bedtime.   Insulin Pen Needle 32G X 4 MM Misc Use to inject lantus once dialy   Invokamet XR 50-1000 MG Tb24 Generic drug: Canagliflozin-metFORMIN HCl ER TAKE 2 TABLETS BY MOUTH DAILY WITH BREAKFAST   oxyCODONE-acetaminophen 7.5-325 MG tablet Commonly known as: PERCOCET Take 1 tablet by mouth every 6 (six) hours as needed for up to 5 days for severe pain.   pantoprazole 40 MG tablet Commonly known as: Protonix Take 1 tablet (40 mg total) by mouth daily.   propranolol ER 120 MG 24 hr capsule Commonly known as: INDERAL LA Take 40 mg by mouth in the morning, at noon, and at bedtime.   traZODone 50 MG tablet Commonly known as: DESYREL Take 1 tablet (50 mg total) by mouth at bedtime as needed for sleep.   vitamin B-12 1000 MCG tablet Commonly known as: CYANOCOBALAMIN Take 1,000 mcg by mouth daily.   VITAMIN C PO Take 500 mg by mouth daily.               Durable Medical Equipment  (From admission, onward)           Start     Ordered  11/07/21 1126  For home use only DME Bedside commode  Once       Question:  Patient needs a bedside  commode to treat with the following condition  Answer:  Ambulatory dysfunction   11/07/21 1125   11/07/21 1125  For home use only DME Walker rolling  Once       Question Answer Comment  Walker: With [place]   Patient needs a walker to treat with the following condition Ambulatory dysfunction      11/07/21 1125              Discharge Care Instructions  (From admission, onward)           Start     Ordered   11/07/21 0000  Discharge wound care:        11/07/21 1333             The results of significant diagnostics from this hospitalization (including imaging, microbiology, ancillary and laboratory) are listed below for reference.    Procedures and Diagnostic Studies:   US RENAL  Result Date: 11/03/2021 CLINICAL DATA:  Oliguria after procedure EXAM: RENAL / URINARY TRACT ULTRASOUND COMPLETE COMPARISON:  CT chest abdomen and pelvis 05/30/2021. FINDINGS: Right Kidney: Renal measurements: 11.2 x 5.1 x 6.8 cm = volume: 130 mL. Mildly echogenic. No hydronephrosis. Superior pole cyst measures 1.3 x 1.1 x 1.3 cm. Inferior pole cyst measures 1.4 x 1.8 x 1.5 cm. Multiple scratched at 9 mm shadowing calculus identified in the right mid kidney, unchanged. Left Kidney: Renal measurements: 10.3 x 4.4 x 5.6 cm = volume: 132 mL. Mildly echogenic. Mild hydronephrosis. There are 2 cysts in the superior pole measuring up to 2.4 cm. 9 mm shadowing calculus identified. Bladder: Appears normal for degree of bladder distention. Other: None. IMPRESSION: 1. Mild left hydronephrosis. 2. Mildly echogenic kidneys may be related to medical renal disease. 3. Bilateral renal calculi and bilateral renal cysts. Electronically Signed   By: Ronney Asters M.D.   On: 11/03/2021 20:35   DG HIP PORT UNILAT WITH PELVIS 1V LEFT  Result Date: 11/03/2021 CLINICAL DATA:  Fall. Left hip pain. Left hip fracture. Preoperative radiograph. EXAM: DG HIP (WITH OR WITHOUT PELVIS) 1V PORT LEFT COMPARISON:  Left hip  radiographs and CT of the left hip 11/02/2021. FINDINGS: Nondisplaced subcapital left femoral neck fracture again demonstrated. No new fractures present. The ureteral stents are in place bilaterally. IMPRESSION: Nondisplaced subcapital left femoral neck fracture. Electronically Signed   By: San Morelle M.D.   On: 11/03/2021 16:22   CT Hip Left Wo Contrast  Result Date: 11/02/2021 CLINICAL DATA:  Hip pain, stress fracture suspected, neg xray EXAM: CT OF THE LEFT HIP WITHOUT CONTRAST TECHNIQUE: Multidetector CT imaging of the left hip was performed according to the standard protocol. Multiplanar CT image reconstructions were also generated. RADIATION DOSE REDUCTION: This exam was performed according to the departmental dose-optimization program which includes automated exposure control, adjustment of the mA and/or kV according to patient size and/or use of iterative reconstruction technique. COMPARISON:  Same day radiograph FINDINGS: Bones/Joint/Cartilage There is an acute left femoral neck fracture with impaction and minimal displacement, which courses through the lateral head neck junction and through the proximal third of the femoral neck medially. Small joint effusion. Ligaments Suboptimally assessed by CT. Muscles and Tendons No acute myotendinous abnormality by CT. Soft tissues Mild adjacent soft tissue swelling. IMPRESSION: Acute left subcapital/transcervical left femoral neck fracture with impaction and minimal displacement. Electronically Signed  By: Maurine Simmering M.D.   On: 11/02/2021 16:38   DG Chest 1 View  Result Date: 11/02/2021 CLINICAL DATA:  Hip fracture. EXAM: CHEST  1 VIEW COMPARISON:  None Available. FINDINGS: Normal mediastinum and cardiac silhouette. Normal pulmonary vasculature. No evidence of effusion, infiltrate, or pneumothorax. No acute bony abnormality. IMPRESSION: No acute cardiopulmonary process. Electronically Signed   By: Suzy Bouchard M.D.   On: 11/02/2021 16:23    CT HEAD WO CONTRAST  Result Date: 11/02/2021 CLINICAL DATA:  Neuro deficit, acute, stroke suspected. Left sided weakness. EXAM: CT HEAD WITHOUT CONTRAST TECHNIQUE: Contiguous axial images were obtained from the base of the skull through the vertex without intravenous contrast. RADIATION DOSE REDUCTION: This exam was performed according to the departmental dose-optimization program which includes automated exposure control, adjustment of the mA and/or kV according to patient size and/or use of iterative reconstruction technique. COMPARISON:  Head CT 10/28/2021 and MRI 12/31/2019 FINDINGS: Brain: Within limitations of streak artifact from a right MCA aneurysm clip, there is no evidence of an acute infarct, intracranial hemorrhage, mass, midline shift, or extra-axial fluid collection. The ventricles are normal in size. Vascular: Prior aneurysm clipping in the right MCA bifurcation region. No hyperdense vessel. Skull: Right pterional craniotomy. Sinuses/Orbits: Paranasal sinuses and mastoid air cells are clear. Bilateral cataract extraction. Other: None. IMPRESSION: No evidence of acute intracranial abnormality. Electronically Signed   By: Logan Bores M.D.   On: 11/02/2021 15:19   DG Elbow Complete Left  Result Date: 11/02/2021 CLINICAL DATA:  Fall, pain EXAM: LEFT ELBOW - COMPLETE 3+ VIEW COMPARISON:  None Available. FINDINGS: Suspect a very subtle fracture of the left radial neck. There is no evidence of arthropathy or other focal bone abnormality. Soft tissues are unremarkable. IMPRESSION: Suspect a very subtle fracture of the left radial neck. Correlate for point tenderness. No elbow joint effusion. Electronically Signed   By: Delanna Ahmadi M.D.   On: 11/02/2021 15:16   DG Hip Unilat W or Wo Pelvis 2-3 Views Left  Result Date: 11/02/2021 CLINICAL DATA:  Fall, left hip pain EXAM: DG HIP (WITH OR WITHOUT PELVIS) 2-3V LEFT COMPARISON:  None Available. FINDINGS: Nondisplaced fracture of the left femoral  neck, which appears to be transcervical or basicervical but is not clearly assessed given internal rotation on all views provided. No displaced fracture of the bony pelvis or proximal right femur seen in single frontal view. IMPRESSION: Nondisplaced fracture of the left femoral neck, which appears to be transcervical or basicervical but is not clearly assessed given internal rotation on all views provided. Consider additional radiographic views or CT to more clearly evaluate fracture anatomy. Electronically Signed   By: Delanna Ahmadi M.D.   On: 11/02/2021 15:14     Labs:   Basic Metabolic Panel: Recent Labs  Lab 11/01/21 0519 11/02/21 1548 11/03/21 0857 11/03/21 1707 11/04/21 0202 11/05/21 0225 11/06/21 0206 11/07/21 0115  NA 141   < > 137 137 136 137 138 139  K 3.2*   < > 3.7 4.0 3.8 4.3 4.1 3.8  CL 107   < > 104 103 107 106 107 101  CO2 26   < > 27 26 23 27 24 27   GLUCOSE 163*   < > 164* 90 143* 247* 225* 272*  BUN 9   < > 13 14 13 11 12 10   CREATININE 0.70   < > 0.70 0.80 0.76 0.75 0.76 0.80  CALCIUM 8.6*   < > 8.4* 9.0 7.7* 8.3* 8.3* 8.5*  MG 1.6*  --  1.6*  --  2.5* 1.8  --   --   PHOS 3.8  --   --   --   --   --   --   --    < > = values in this interval not displayed.   GFR Estimated Creatinine Clearance: 67.3 mL/min (by C-G formula based on SCr of 0.8 mg/dL). Liver Function Tests: Recent Labs  Lab 11/01/21 0519 11/02/21 1548  AST 14* 42*  ALT 12 8  ALKPHOS 68 72  BILITOT 0.8 2.2*  PROT 6.2* 6.8  ALBUMIN 3.0* 3.0*   No results for input(s): "LIPASE", "AMYLASE" in the last 168 hours. No results for input(s): "AMMONIA" in the last 168 hours. Coagulation profile Recent Labs  Lab 11/02/21 1504  INR 1.0    CBC: Recent Labs  Lab 11/03/21 0857 11/04/21 0202 11/05/21 0225 11/06/21 0206 11/07/21 0115  WBC 7.3 6.7 4.3 7.3 6.9  NEUTROABS 4.3 3.6 3.5 3.5 3.0  HGB 10.8* 11.0* 10.7* 9.7* 9.8*  HCT 34.2* 34.4* 33.7* 30.1* 31.4*  MCV 78.8* 77.5* 77.8* 77.8*  77.9*  PLT 285 278 334 353 356   Cardiac Enzymes: No results for input(s): "CKTOTAL", "CKMB", "CKMBINDEX", "TROPONINI" in the last 168 hours. BNP: Invalid input(s): "POCBNP" CBG: Recent Labs  Lab 11/06/21 1153 11/06/21 1627 11/06/21 2040 11/07/21 0800 11/07/21 1231  GLUCAP 115* 245* 220* 220* 130*   D-Dimer No results for input(s): "DDIMER" in the last 72 hours. Hgb A1c No results for input(s): "HGBA1C" in the last 72 hours. Lipid Profile No results for input(s): "CHOL", "HDL", "LDLCALC", "TRIG", "CHOLHDL", "LDLDIRECT" in the last 72 hours. Thyroid function studies No results for input(s): "TSH", "T4TOTAL", "T3FREE", "THYROIDAB" in the last 72 hours.  Invalid input(s): "FREET3" Anemia work up Recent Labs    11/05/21 0225  VITAMINB12 1,687*  FOLATE 20.9  FERRITIN 44  TIBC 263  IRON 15*   Microbiology Recent Results (from the past 240 hour(s))  SARS Coronavirus 2 by RT PCR (hospital order, performed in [facility] hospital lab) *cepheid single result test* Anterior Nasal Swab     Status: None   Collection Time: 10/28/21  6:55 PM   Specimen: Anterior Nasal Swab  Result Value Ref Range Status   SARS Coronavirus 2 by RT PCR NEGATIVE NEGATIVE Final    Comment: (NOTE) SARS-CoV-2 target nucleic acids are NOT DETECTED.  The SARS-CoV-2 RNA is generally detectable in upper and lower respiratory specimens during the acute phase of infection. The lowest concentration of SARS-CoV-2 viral copies this assay can detect is 250 copies / mL. A negative result does not preclude SARS-CoV-2 infection and should not be used as the sole basis for treatment or other patient management decisions.  A negative result may occur with improper specimen collection / handling, submission of specimen other than nasopharyngeal swab, presence of viral mutation(s) within the areas targeted by this assay, and inadequate number of viral copies (<250 copies / mL). A negative result must be combined  with clinical observations, patient history, and epidemiological information.  Fact Sheet for Patients:   https://www.patel.info/  Fact Sheet for Healthcare Providers: https://hall.com/  This test is not yet approved or  cleared by the [place] FDA and has been authorized for detection and/or diagnosis of SARS-CoV-2 by FDA under an Emergency Use Authorization (EUA).  This EUA will remain in effect (meaning this test can be used) for the duration of the COVID-19 declaration under Section 564(b)(1) of the Act, 21 U.S.C.  section 360bbb-3(b)(1), unless the authorization is terminated or revoked sooner.  Performed at [facility], [address], [place], [place] [address]   Urine Culture     Status: Abnormal   Collection Time: 10/28/21  8:45 PM   Specimen: Urine, Clean Catch  Result Value Ref Range Status   Specimen Description   Final    URINE, CLEAN CATCH Performed at [facility], [address] [place], [place] [address]    Special Requests   Final    NONE Performed at [facility], [address] [place], [place] [address]    Culture MULTIPLE SPECIES PRESENT, SUGGEST RECOLLECTION (A)  Final   Report Status 10/29/2021 FINAL  Final  MRSA Next Gen by PCR, Nasal     Status: None   Collection Time: 10/29/21  2:04 AM   Specimen: Nasal Mucosa; Nasal Swab  Result Value Ref Range Status   MRSA by PCR Next Gen NOT DETECTED NOT DETECTED Final    Comment: (NOTE) The GeneXpert MRSA Assay (FDA approved for NASAL specimens only), is one component of a comprehensive MRSA colonization surveillance program. It is not intended to diagnose MRSA infection nor to guide or monitor treatment for MRSA infections. Test performance is not FDA approved in patients less than 26 years old. Performed at [facility], [place] [address], [place], [place] [address]   Urine Culture     Status: Abnormal    Collection Time: 11/04/21  7:01 AM   Specimen: Urine, Catheterized  Result Value Ref Range Status   Specimen Description URINE, CATHETERIZED  Final   Special Requests NONE  Final   Culture (A)  Final    <10,000 COLONIES/mL INSIGNIFICANT GROWTH Performed at [facility], [place] [address], [place], [place] [address]    Report Status 11/05/2021 FINAL  Final    Time coordinating discharge: 35 minutes  Signed: Norvel Wenker  [facility] 11/07/2021, 3:03 PM

## 2021-11-11 ENCOUNTER — Other Ambulatory Visit: Payer: Self-pay

## 2021-11-11 ENCOUNTER — Encounter (HOSPITAL_COMMUNITY): Payer: Self-pay

## 2021-11-11 ENCOUNTER — Encounter (HOSPITAL_COMMUNITY)
Admission: RE | Admit: 2021-11-11 | Discharge: 2021-11-11 | Disposition: A | Discharge status: Home / Self Care | Payer: Medicare HMO | Source: Ambulatory Visit | Attending: Urology | Admitting: Urology

## 2021-11-11 ENCOUNTER — Other Ambulatory Visit: Payer: Self-pay | Admitting: Urology

## 2021-11-11 ENCOUNTER — Ambulatory Visit
Admission: RE | Admit: 2021-11-11 | Discharge: 2021-11-11 | Disposition: A | Discharge status: Home / Self Care | Payer: Medicare HMO | Source: Ambulatory Visit | Attending: Internal Medicine | Admitting: Internal Medicine

## 2021-11-11 ENCOUNTER — Other Ambulatory Visit: Payer: Self-pay | Admitting: Internal Medicine

## 2021-11-11 DIAGNOSIS — M25532 Pain in left wrist: Secondary | ICD-10-CM

## 2021-11-11 DIAGNOSIS — Z8781 Personal history of (healed) traumatic fracture: Secondary | ICD-10-CM | POA: Diagnosis not present

## 2021-11-11 DIAGNOSIS — N202 Calculus of kidney with calculus of ureter: Secondary | ICD-10-CM | POA: Diagnosis not present

## 2021-11-11 DIAGNOSIS — N200 Calculus of kidney: Secondary | ICD-10-CM | POA: Diagnosis not present

## 2021-11-11 DIAGNOSIS — R339 Retention of urine, unspecified: Secondary | ICD-10-CM | POA: Diagnosis not present

## 2021-11-11 DIAGNOSIS — Z01818 Encounter for other preprocedural examination: Secondary | ICD-10-CM

## 2021-11-11 DIAGNOSIS — D500 Iron deficiency anemia secondary to blood loss (chronic): Secondary | ICD-10-CM | POA: Diagnosis not present

## 2021-11-11 DIAGNOSIS — M25539 Pain in unspecified wrist: Secondary | ICD-10-CM | POA: Diagnosis not present

## 2021-11-11 DIAGNOSIS — R8271 Bacteriuria: Secondary | ICD-10-CM | POA: Diagnosis not present

## 2021-11-11 NOTE — Progress Notes (Signed)
DUE TO COVID-19 ONLY 2  VISITOR IS ALLOWED TO COME WITH YOU AND STAY IN THE WAITING ROOM ONLY DURING PRE OP AND PROCEDURE DAY OF SURGERY.  4  VISITOR  MAY VISIT WITH YOU AFTER SURGERY IN YOUR PRIVATE ROOM DURING VISITING HOURS ONLY! YOU MAY HAVE ONE PERSON SPEND THE NITE WITH YOU IN YOUR ROOM AFTER SURGERY.     Your procedure is scheduled on:           11/19/21   Report to [facility] Main  Entrance   Report to admitting at   1245 pm      do not           BRING INSURANCE CARD, PICTURE ID OR WALLET DAY OF SURGERY.      Call this number if you have problems the morning of surgery [phone]    REMEMBER: NO  SOLID FOODS , CANDY, GUM OR MINTS AFTER [place] .       Marland Kitchen CLEAR LIQUIDS UNTIL   1145 am              DAY OF SURGERY.       CLEAR LIQUID DIET   Foods Allowed      WATER BLACK COFFEE ( SUGAR OK, NO MILK, CREAM OR CREAMER) REGULAR AND DECAF  TEA ( SUGAR OK NO MILK, CREAM, OR CREAMER) REGULAR AND DECAF  PLAIN JELLO ( NO RED)  FRUIT ICES ( NO RED, NO FRUIT PULP)  POPSICLES ( NO RED)  JUICE- APPLE, WHITE GRAPE AND WHITE CRANBERRY  SPORT DRINK LIKE GATORADE ( NO RED)  CLEAR BROTH ( VEGETABLE , CHICKEN OR BEEF)                                                                     BRUSH YOUR TEETH MORNING OF SURGERY AND RINSE YOUR MOUTH OUT, NO CHEWING GUM CANDY OR MINTS.     Take these medicines the morning of surgery with A SIP OF WATER:  cymbalta, protonix, propanolol  Take 1/2 of lantus insulin nite before surgery.     DO NOT TAKE ANY DIABETIC MEDICATIONS DAY OF YOUR SURGERY                               You may not have any metal on your body including hair pins and              piercings  Do not wear jewelry, make-up, lotions, powders or perfumes, deodorant             Do not wear nail polish on your fingernails.              IF YOU ARE A FEMALE AND WANT TO SHAVE UNDER ARMS OR LEGS PRIOR TO SURGERY YOU MUST DO SO AT LEAST 48 HOURS PRIOR TO  SURGERY.              Men may shave face and neck.   Do not bring valuables to the hospital. [place].  Contacts, dentures or bridgework may not be  worn into surgery.  Leave suitcase in the car. After surgery it may be brought to your room.     Patients discharged the day of surgery will not be allowed to drive home. IF YOU ARE HAVING SURGERY AND GOING HOME THE SAME DAY, YOU MUST HAVE AN ADULT TO DRIVE YOU HOME AND BE WITH YOU FOR 24 HOURS. YOU MAY GO HOME BY TAXI OR UBER OR ORTHERWISE, BUT AN ADULT MUST ACCOMPANY YOU HOME AND STAY WITH YOU FOR 24 HOURS.                Please read over the following fact sheets you were given: _____________________________________________________________________  [facility] - Preparing for Surgery Before surgery, you can play an important role.  Because skin is not sterile, your skin needs to be as free of germs as possible.  You can reduce the number of germs on your skin by washing with CHG (chlorahexidine gluconate) soap before surgery.  CHG is an antiseptic cleaner which kills germs and bonds with the skin to continue killing germs even after washing. Please DO NOT use if you have an allergy to CHG or antibacterial soaps.  If your skin becomes reddened/irritated stop using the CHG and inform your nurse when you arrive at Short Stay. Do not shave (including legs and underarms) for at least 48 hours prior to the first CHG shower.  You may shave your face/neck. Please follow these instructions carefully:  1.  Shower with CHG Soap the night before surgery and the  morning of Surgery.  2.  If you choose to wash your hair, wash your hair first as usual with your  normal  shampoo.  3.  After you shampoo, rinse your hair and body thoroughly to remove the  shampoo.                           4.  Use CHG as you would any other liquid soap.  You can apply chg directly  to the skin and wash                       Gently  with a scrungie or clean washcloth.  5.  Apply the CHG Soap to your body ONLY FROM THE NECK DOWN.   Do not use on face/ open                           Wound or open sores. Avoid contact with eyes, ears mouth and genitals (private parts).                       Wash face,  Genitals (private parts) with your normal soap.             6.  Wash thoroughly, paying special attention to the area where your surgery  will be performed.  7.  Thoroughly rinse your body with warm water from the neck down.  8.  DO NOT shower/wash with your normal soap after using and rinsing off  the CHG Soap.                9.  Pat yourself dry with a clean towel.            10.  Wear clean pajamas.            11.  Place clean sheets on your bed the  night of your first shower and do not  sleep with pets. Day of Surgery : Do not apply any lotions/deodorants the morning of surgery.  Please wear clean clothes to the hospital/surgery center.  FAILURE TO FOLLOW THESE INSTRUCTIONS MAY RESULT IN THE CANCELLATION OF YOUR SURGERY PATIENT SIGNATURE_________________________________  NURSE SIGNATURE__________________________________  ________________________________________________________________________

## 2021-11-11 NOTE — Progress Notes (Addendum)
Anesthesia Review:  PCP: DR Seward Carol appt on 11/11/21  AT 1615   To follow up on left wirst ? Fracture  Cardiologist : none  Chest x-ray : 11/02/21- 1v  EKG : 11/03/21  Echo : Stress test: Cardiac Cath :  Activity level: can do a flight of  stairs without diffciulty  Sleep Study/ CPAP : none  Fasting Blood Sugar :      / Checks Blood Sugar -- times a day:   Blood Thinner/ Instructions /Last Dose: ASA / Instructions/ Last Dose :   10/29/21-hgba1c- 8.8  325 mg aspirin  Bmp and cbc on 11/07/21-  hgb- 9.8  10/29/21- cystoscopy - fell in room at hospital after cysto , discharged home then returned to ED  11/02/21- in ed for a fall  11/04/21- hip pinning  discharged on 11/07/21.   DM- type 2  checks glucose 4x daily  Currently with a walker  Had appt with DR Tresa Moore on 11/11/21  PT was a phone call appt on 11/11/21 to review med hx and go over preop instructions which were completed.  With pt via phone on 11/11/21.  Armando Reichert made aware of above hx and cbc and bmp result from 11/07/2021.  No new orders given except to check CBC am of  surgery.

## 2021-11-12 ENCOUNTER — Other Ambulatory Visit (HOSPITAL_COMMUNITY): Payer: Medicare HMO

## 2021-11-13 ENCOUNTER — Telehealth: Payer: Self-pay | Admitting: Orthopaedic Surgery

## 2021-11-13 NOTE — Telephone Encounter (Signed)
Received call from Tommi Emery (PT) with [facility]  asking if there are an restrictions for patient's left arm and left hip? Don asked if patient's are should be in a sling? Don asked if patient can use her left arm for daily activities and range of motion exercises? The number to contact Tommi Emery is [phone]

## 2021-11-14 ENCOUNTER — Ambulatory Visit (INDEPENDENT_AMBULATORY_CARE_PROVIDER_SITE_OTHER): Payer: Medicare HMO | Admitting: Orthopaedic Surgery

## 2021-11-14 ENCOUNTER — Ambulatory Visit (INDEPENDENT_AMBULATORY_CARE_PROVIDER_SITE_OTHER): Payer: Medicare HMO

## 2021-11-14 ENCOUNTER — Other Ambulatory Visit (HOSPITAL_BASED_OUTPATIENT_CLINIC_OR_DEPARTMENT_OTHER): Payer: Self-pay | Admitting: Orthopaedic Surgery

## 2021-11-14 DIAGNOSIS — M778 Other enthesopathies, not elsewhere classified: Secondary | ICD-10-CM | POA: Diagnosis not present

## 2021-11-14 DIAGNOSIS — S72002D Fracture of unspecified part of neck of left femur, subsequent encounter for closed fracture with routine healing: Secondary | ICD-10-CM

## 2021-11-14 DIAGNOSIS — Z0389 Encounter for observation for other suspected diseases and conditions ruled out: Secondary | ICD-10-CM | POA: Diagnosis not present

## 2021-11-14 DIAGNOSIS — M25532 Pain in left wrist: Secondary | ICD-10-CM

## 2021-11-18 MED ORDER — GENTAMICIN SULFATE 40 MG/ML IJ SOLN
5.0000 mg/kg | INTRAVENOUS | Status: AC
  Administered 2021-11-19: 360 mg via INTRAVENOUS
  Filled 2021-11-18: qty 9

## 2021-11-19 ENCOUNTER — Encounter (HOSPITAL_BASED_OUTPATIENT_CLINIC_OR_DEPARTMENT_OTHER): Payer: Medicare HMO | Admitting: Orthopaedic Surgery

## 2021-11-19 ENCOUNTER — Ambulatory Visit (HOSPITAL_COMMUNITY)
Admission: RE | Admit: 2021-11-19 | Discharge: 2021-11-19 | Disposition: A | Discharge status: Home / Self Care | Payer: Medicare HMO | Source: Home / Self Care | Attending: Urology | Admitting: Urology

## 2021-11-19 ENCOUNTER — Encounter (HOSPITAL_COMMUNITY): Payer: Self-pay | Admitting: Urology

## 2021-11-19 ENCOUNTER — Ambulatory Visit (HOSPITAL_COMMUNITY): Payer: Medicare HMO

## 2021-11-19 ENCOUNTER — Encounter (HOSPITAL_COMMUNITY)
Admission: RE | Disposition: A | Discharge status: Home / Self Care | Payer: Self-pay | Source: Home / Self Care | Attending: Urology

## 2021-11-19 ENCOUNTER — Ambulatory Visit (HOSPITAL_COMMUNITY): Payer: Medicare HMO | Admitting: Physician Assistant

## 2021-11-19 ENCOUNTER — Ambulatory Visit (HOSPITAL_BASED_OUTPATIENT_CLINIC_OR_DEPARTMENT_OTHER): Payer: Medicare HMO | Admitting: Anesthesiology

## 2021-11-19 DIAGNOSIS — Z7983 Long term (current) use of bisphosphonates: Secondary | ICD-10-CM | POA: Diagnosis not present

## 2021-11-19 DIAGNOSIS — Z794 Long term (current) use of insulin: Secondary | ICD-10-CM | POA: Insufficient documentation

## 2021-11-19 DIAGNOSIS — N202 Calculus of kidney with calculus of ureter: Secondary | ICD-10-CM | POA: Insufficient documentation

## 2021-11-19 DIAGNOSIS — N3001 Acute cystitis with hematuria: Secondary | ICD-10-CM | POA: Diagnosis not present

## 2021-11-19 DIAGNOSIS — I951 Orthostatic hypotension: Secondary | ICD-10-CM | POA: Diagnosis present

## 2021-11-19 DIAGNOSIS — E119 Type 2 diabetes mellitus without complications: Secondary | ICD-10-CM | POA: Diagnosis not present

## 2021-11-19 DIAGNOSIS — I10 Essential (primary) hypertension: Secondary | ICD-10-CM | POA: Diagnosis present

## 2021-11-19 DIAGNOSIS — R000 Tachycardia, unspecified: Secondary | ICD-10-CM | POA: Diagnosis not present

## 2021-11-19 DIAGNOSIS — Z87891 Personal history of nicotine dependence: Secondary | ICD-10-CM

## 2021-11-19 DIAGNOSIS — Z887 Allergy status to serum and vaccine status: Secondary | ICD-10-CM | POA: Diagnosis not present

## 2021-11-19 DIAGNOSIS — Z79899 Other long term (current) drug therapy: Secondary | ICD-10-CM | POA: Diagnosis not present

## 2021-11-19 DIAGNOSIS — N12 Tubulo-interstitial nephritis, not specified as acute or chronic: Secondary | ICD-10-CM | POA: Diagnosis present

## 2021-11-19 DIAGNOSIS — N200 Calculus of kidney: Secondary | ICD-10-CM | POA: Diagnosis not present

## 2021-11-19 DIAGNOSIS — N390 Urinary tract infection, site not specified: Secondary | ICD-10-CM | POA: Diagnosis not present

## 2021-11-19 DIAGNOSIS — Z01818 Encounter for other preprocedural examination: Secondary | ICD-10-CM

## 2021-11-19 DIAGNOSIS — S42402D Unspecified fracture of lower end of left humerus, subsequent encounter for fracture with routine healing: Secondary | ICD-10-CM | POA: Diagnosis not present

## 2021-11-19 DIAGNOSIS — E876 Hypokalemia: Secondary | ICD-10-CM | POA: Diagnosis not present

## 2021-11-19 DIAGNOSIS — T83593A Infection and inflammatory reaction due to other urinary stents, initial encounter: Secondary | ICD-10-CM | POA: Diagnosis present

## 2021-11-19 DIAGNOSIS — K219 Gastro-esophageal reflux disease without esophagitis: Secondary | ICD-10-CM | POA: Diagnosis present

## 2021-11-19 DIAGNOSIS — R4182 Altered mental status, unspecified: Secondary | ICD-10-CM | POA: Diagnosis not present

## 2021-11-19 DIAGNOSIS — R519 Headache, unspecified: Secondary | ICD-10-CM | POA: Diagnosis not present

## 2021-11-19 DIAGNOSIS — Z7982 Long term (current) use of aspirin: Secondary | ICD-10-CM | POA: Diagnosis not present

## 2021-11-19 DIAGNOSIS — A419 Sepsis, unspecified organism: Secondary | ICD-10-CM | POA: Diagnosis present

## 2021-11-19 DIAGNOSIS — W19XXXD Unspecified fall, subsequent encounter: Secondary | ICD-10-CM | POA: Diagnosis present

## 2021-11-19 DIAGNOSIS — R739 Hyperglycemia, unspecified: Secondary | ICD-10-CM | POA: Diagnosis not present

## 2021-11-19 DIAGNOSIS — M25552 Pain in left hip: Secondary | ICD-10-CM | POA: Diagnosis not present

## 2021-11-19 DIAGNOSIS — N281 Cyst of kidney, acquired: Secondary | ICD-10-CM | POA: Diagnosis not present

## 2021-11-19 DIAGNOSIS — Z20822 Contact with and (suspected) exposure to covid-19: Secondary | ICD-10-CM | POA: Diagnosis present

## 2021-11-19 DIAGNOSIS — E1110 Type 2 diabetes mellitus with ketoacidosis without coma: Secondary | ICD-10-CM | POA: Insufficient documentation

## 2021-11-19 DIAGNOSIS — S72002D Fracture of unspecified part of neck of left femur, subsequent encounter for closed fracture with routine healing: Secondary | ICD-10-CM | POA: Diagnosis not present

## 2021-11-19 DIAGNOSIS — M810 Age-related osteoporosis without current pathological fracture: Secondary | ICD-10-CM | POA: Diagnosis present

## 2021-11-19 DIAGNOSIS — E782 Mixed hyperlipidemia: Secondary | ICD-10-CM | POA: Diagnosis not present

## 2021-11-19 DIAGNOSIS — M542 Cervicalgia: Secondary | ICD-10-CM | POA: Diagnosis not present

## 2021-11-19 DIAGNOSIS — G9341 Metabolic encephalopathy: Secondary | ICD-10-CM | POA: Diagnosis present

## 2021-11-19 DIAGNOSIS — Z882 Allergy status to sulfonamides status: Secondary | ICD-10-CM | POA: Diagnosis not present

## 2021-11-19 DIAGNOSIS — Y733 Surgical instruments, materials and gastroenterology and urology devices (including sutures) associated with adverse incidents: Secondary | ICD-10-CM | POA: Diagnosis present

## 2021-11-19 DIAGNOSIS — Z85828 Personal history of other malignant neoplasm of skin: Secondary | ICD-10-CM | POA: Diagnosis not present

## 2021-11-19 DIAGNOSIS — K760 Fatty (change of) liver, not elsewhere classified: Secondary | ICD-10-CM | POA: Diagnosis not present

## 2021-11-19 DIAGNOSIS — Z8781 Personal history of (healed) traumatic fracture: Secondary | ICD-10-CM | POA: Diagnosis not present

## 2021-11-19 DIAGNOSIS — Z91030 Bee allergy status: Secondary | ICD-10-CM | POA: Diagnosis not present

## 2021-11-19 DIAGNOSIS — E7800 Pure hypercholesterolemia, unspecified: Secondary | ICD-10-CM | POA: Diagnosis present

## 2021-11-19 HISTORY — PX: HOLMIUM LASER APPLICATION: SHX5852

## 2021-11-19 HISTORY — PX: CYSTOSCOPY WITH RETROGRADE PYELOGRAM, URETEROSCOPY AND STENT PLACEMENT: SHX5789

## 2021-11-19 LAB — CBC
HCT: 36 % (ref 36.0–46.0)
Hemoglobin: 11.3 g/dL — ABNORMAL LOW (ref 12.0–15.0)
MCH: 24.5 pg — ABNORMAL LOW (ref 26.0–34.0)
MCHC: 31.4 g/dL (ref 30.0–36.0)
MCV: 78.1 fL — ABNORMAL LOW (ref 80.0–100.0)
Platelets: 467 10*3/uL — ABNORMAL HIGH (ref 150–400)
RBC: 4.61 MIL/uL (ref 3.87–5.11)
RDW: 14.2 % (ref 11.5–15.5)
WBC: 11.3 10*3/uL — ABNORMAL HIGH (ref 4.0–10.5)
nRBC: 0 % (ref 0.0–0.2)

## 2021-11-19 LAB — GLUCOSE, CAPILLARY
Glucose-Capillary: 219 mg/dL — ABNORMAL HIGH (ref 70–99)
Glucose-Capillary: 191 mg/dL — ABNORMAL HIGH (ref 70–99)

## 2021-11-19 SURGERY — CYSTOURETEROSCOPY, WITH RETROGRADE PYELOGRAM AND STENT INSERTION
Anesthesia: General | Laterality: Bilateral

## 2021-11-19 MED ORDER — LACTATED RINGERS IV SOLN: INTRAVENOUS | Status: DC

## 2021-11-19 MED ORDER — LIDOCAINE HCL (PF) 2 % IJ SOLN
INTRAMUSCULAR | Status: AC
  Filled 2021-11-19: qty 5

## 2021-11-19 MED ORDER — EPHEDRINE 5 MG/ML INJ
INTRAVENOUS | Status: AC
  Filled 2021-11-19: qty 5

## 2021-11-19 MED ORDER — DEXAMETHASONE SODIUM PHOSPHATE 10 MG/ML IJ SOLN
INTRAMUSCULAR | Status: AC
  Filled 2021-11-19: qty 1

## 2021-11-19 MED ORDER — DEXAMETHASONE SODIUM PHOSPHATE 10 MG/ML IJ SOLN
INTRAMUSCULAR | Status: DC | PRN
  Administered 2021-11-19: 8 mg via INTRAVENOUS

## 2021-11-19 MED ORDER — OXYCODONE HCL 5 MG PO TABS: 5.0000 mg | ORAL_TABLET | Freq: Once | ORAL | Status: DC | PRN

## 2021-11-19 MED ORDER — NITROFURANTOIN MONOHYD MACRO 100 MG PO CAPS
100.0000 mg | ORAL_CAPSULE | Freq: Two times a day (BID) | ORAL | 0 refills | Status: DC
Start: 2021-11-19 — End: 2021-11-25

## 2021-11-19 MED ORDER — MIDAZOLAM HCL 2 MG/2ML IJ SOLN
INTRAMUSCULAR | Status: AC
  Filled 2021-11-19: qty 2

## 2021-11-19 MED ORDER — OXYCODONE HCL 5 MG/5ML PO SOLN: 5.0000 mg | Freq: Once | ORAL | Status: DC | PRN

## 2021-11-19 MED ORDER — LIDOCAINE HCL (CARDIAC) PF 100 MG/5ML IV SOSY
PREFILLED_SYRINGE | INTRAVENOUS | Status: DC | PRN
  Administered 2021-11-19: 100 mg via INTRAVENOUS

## 2021-11-19 MED ORDER — OXYCODONE-ACETAMINOPHEN 5-325 MG PO TABS: 1.0000 | ORAL_TABLET | Freq: Four times a day (QID) | ORAL | 0 refills | Status: DC | PRN

## 2021-11-19 MED ORDER — MEPERIDINE HCL 50 MG/ML IJ SOLN: 6.2500 mg | INTRAMUSCULAR | Status: DC | PRN

## 2021-11-19 MED ORDER — EPHEDRINE SULFATE-NACL 50-0.9 MG/10ML-% IV SOSY
PREFILLED_SYRINGE | INTRAVENOUS | Status: DC | PRN
  Administered 2021-11-19: 15 mg via INTRAVENOUS
  Administered 2021-11-19: 10 mg via INTRAVENOUS

## 2021-11-19 MED ORDER — ORAL CARE MOUTH RINSE: 15.0000 mL | Freq: Once | OROMUCOSAL | Status: AC

## 2021-11-19 MED ORDER — MIDAZOLAM HCL 2 MG/2ML IJ SOLN: 0.5000 mg | Freq: Once | INTRAMUSCULAR | Status: DC | PRN

## 2021-11-19 MED ORDER — SODIUM CHLORIDE 0.9 % IR SOLN
Status: DC | PRN
  Administered 2021-11-19: 3000 mL via INTRAVESICAL

## 2021-11-19 MED ORDER — ONDANSETRON HCL 4 MG/2ML IJ SOLN
INTRAMUSCULAR | Status: DC | PRN
  Administered 2021-11-19: 4 mg via INTRAVENOUS

## 2021-11-19 MED ORDER — PROMETHAZINE HCL 25 MG/ML IJ SOLN: 6.2500 mg | INTRAMUSCULAR | Status: DC | PRN

## 2021-11-19 MED ORDER — PHENYLEPHRINE 80 MCG/ML (10ML) SYRINGE FOR IV PUSH (FOR BLOOD PRESSURE SUPPORT)
PREFILLED_SYRINGE | INTRAVENOUS | Status: AC
  Filled 2021-11-19: qty 10

## 2021-11-19 MED ORDER — PROPOFOL 10 MG/ML IV BOLUS
INTRAVENOUS | Status: DC | PRN
  Administered 2021-11-19: 100 mg via INTRAVENOUS

## 2021-11-19 MED ORDER — MIDAZOLAM HCL 5 MG/5ML IJ SOLN
INTRAMUSCULAR | Status: DC | PRN
  Administered 2021-11-19: 2 mg via INTRAVENOUS

## 2021-11-19 MED ORDER — ONDANSETRON HCL 4 MG/2ML IJ SOLN
INTRAMUSCULAR | Status: AC
  Filled 2021-11-19: qty 2

## 2021-11-19 MED ORDER — FENTANYL CITRATE (PF) 100 MCG/2ML IJ SOLN
INTRAMUSCULAR | Status: AC
  Filled 2021-11-19: qty 2

## 2021-11-19 MED ORDER — PROPOFOL 10 MG/ML IV BOLUS
INTRAVENOUS | Status: AC
  Filled 2021-11-19: qty 20

## 2021-11-19 MED ORDER — IOHEXOL 300 MG/ML  SOLN
INTRAMUSCULAR | Status: DC | PRN
  Administered 2021-11-19: 15 mL via URETHRAL

## 2021-11-19 MED ORDER — PHENYLEPHRINE 80 MCG/ML (10ML) SYRINGE FOR IV PUSH (FOR BLOOD PRESSURE SUPPORT)
PREFILLED_SYRINGE | INTRAVENOUS | Status: DC | PRN
  Administered 2021-11-19: 160 ug via INTRAVENOUS

## 2021-11-19 MED ORDER — FENTANYL CITRATE PF 50 MCG/ML IJ SOSY: 25.0000 ug | PREFILLED_SYRINGE | INTRAMUSCULAR | Status: DC | PRN

## 2021-11-19 MED ORDER — FENTANYL CITRATE (PF) 100 MCG/2ML IJ SOLN
INTRAMUSCULAR | Status: DC | PRN
  Administered 2021-11-19 (×2): 50 ug via INTRAVENOUS

## 2021-11-19 MED ORDER — CHLORHEXIDINE GLUCONATE 0.12 % MT SOLN
15.0000 mL | Freq: Once | OROMUCOSAL | Status: AC
  Administered 2021-11-19: 15 mL via OROMUCOSAL

## 2021-11-19 SURGICAL SUPPLY — 26 items
BAG URO CATCHER STRL LF (MISCELLANEOUS) ×3 IMPLANT
BASKET LASER NITINOL 1.9FR (BASKET) ×1 IMPLANT
BSKT STON RTRVL 120 1.9FR (BASKET) ×1
CATH URETL OPEN END 6FR 70 (CATHETERS) ×3 IMPLANT
CLOTH BEACON ORANGE TIMEOUT ST (SAFETY) ×3 IMPLANT
EXTRACTOR STONE 1.7FRX115CM (UROLOGICAL SUPPLIES) IMPLANT
GLOVE SURG LX 7.5 STRW (GLOVE) ×3
GLOVE SURG LX STRL 7.5 STRW (GLOVE) ×2 IMPLANT
GOWN SRG XL LVL 4 BRTHBL STRL (GOWNS) ×2 IMPLANT
GOWN STRL NON-REIN XL LVL4 (GOWNS) ×2
GUIDEWIRE ANG ZIPWIRE 038X150 (WIRE) ×4 IMPLANT
GUIDEWIRE STR DUAL SENSOR (WIRE) ×4 IMPLANT
KIT TURNOVER KIT A (KITS) ×1 IMPLANT
LASER FIB FLEXIVA PULSE ID 365 (Laser) IMPLANT
LASER FIB FLEXIVA PULSE ID 550 (Laser) IMPLANT
LASER FIB FLEXIVA PULSE ID 910 (Laser) IMPLANT
MANIFOLD NEPTUNE II (INSTRUMENTS) ×3 IMPLANT
PACK CYSTO (CUSTOM PROCEDURE TRAY) ×3 IMPLANT
SHEATH NAVIGATOR HD 11/13X28 (SHEATH) ×1 IMPLANT
SHEATH NAVIGATOR HD 11/13X36 (SHEATH) IMPLANT
STENT POLARIS 5FRX22 (STENTS) ×2 IMPLANT
TRACTIP FLEXIVA PULS ID 200XHI (Laser) IMPLANT
TRACTIP FLEXIVA PULSE ID 200 (Laser)
TUBE FEEDING 8FR 16IN STR KANG (MISCELLANEOUS) ×3 IMPLANT
TUBING CONNECTING 10 (TUBING) ×3 IMPLANT
TUBING UROLOGY SET (TUBING) ×3 IMPLANT

## 2021-11-19 NOTE — H&P (Signed)
Laura Gentry is an 66 y.o. female.    Chief Complaint: Pre-OP BILATERAL Ureteroscopic Stone Manipulation  HPI:   1 - Recurrent Urolithiasis -  Pre 2023 - SWL x several, URS x 1  10/2021 - Bilat stents for 23m Rt distal ureteral stone with very mild hydro and punctate bilateral renal stones (about 1cm each side) on ER CT 10/2021 on eval abdominal pain. C r 1.3. UA with pyuria, -nitirtes.     2 - Rule Out Pyelonephritis - subjective maliase at home, mild leukocytosis to 14k, lactate 2.1 by ER eval 10/2021. No fevers / hypotensision sepsis. Also in mild DKA. Final CX's all non-clonal or scant growth.     PMH sig for IDDM, hyst, appy, brain aneurysm, left hip FX. Her PCP is RSeward CarolMD.     Today "Laura Gentry is seen to proceed with BILATERAL ureteroscopic stone manipulation. No interval fevers. Most recent UCX negative.  Did have recent left hip surgery, making great progress with PT.   Past Medical History:  Diagnosis Date   Brain aneurysm 03/19/2013   Cataracts, bilateral    Cerebral aneurysm    Colon polyps    Diabetes ([place] 2013   High cholesterol    History of kidney stones    Insomnia    Lumbosacral radiculopathy    Osteoporosis    Skin cancer    squamous   Thyroid disease    Tremor     Past Surgical History:  Procedure Laterality Date   APPENDECTOMY     BRAIN SURGERY  03/19/13   Sugita aneyrsum clip can have MRI up to [facility] in    CHOLECYSTECTOMY  2013   [place] URETEROSCOPY AND STENT PLACEMENT Bilateral 10/29/2021   Procedure: [place] URETEROSCOPY AND STENT PLACEMENT;  Surgeon: MAlexis Frock MD;  Location: [facility];  Service: Urology;  Laterality: Bilateral;   HIP PINNING,CANNULATED Left 11/04/2021   Procedure: CANNULATED HIP PINNING;  Surgeon: BVanetta Mulders MD;  [place]  Service: Orthopedics;  Laterality: Left;   INNER EAR SURGERY     Lost Hearing   TONSILLECTOMY AND ADENOIDECTOMY      VAGINAL HYSTERECTOMY     At age [age]due to "growth"    Family History  Problem Relation Age of Onset   Asthma Mother    Allergies Mother    Diabetes Mother    Lung cancer Father        smoked   Throat cancer Father        smoked   Pancreatic cancer Brother    Hypertension Maternal Grandfather    Diabetes Maternal Grandfather    Emphysema Maternal Aunt        never smoked, spouse did   Breast cancer Maternal Aunt    Breast cancer Maternal Aunt    Breast cancer Paternal Aunt    Social History:  reports that she quit smoking about 10 years ago. Her smoking use included cigarettes. She has a 20.00 pack-year smoking history. She has never used smokeless tobacco. She reports that she does not drink alcohol and does not use drugs.  Allergies:  Allergies  Allergen Reactions   Bee Venom Shortness Of Breath   Penicillins Anaphylaxis    Remote reaction Throat swelling - compromised airway Tolerates ceftriaxone   Pneumococcal Vaccines     Per patient due to Brain Aneurysm   Sulfa Antibiotics Hives, Itching and Swelling   Bactrim [Sulfamethoxazole-Trimethoprim]     Felt off balance  No medications prior to admission.    No results found for this or any previous visit (from the past 48 hour(s)). No results found.  Review of Systems  Constitutional:  Negative for chills and fever.  Genitourinary:  Positive for urgency.  All other systems reviewed and are negative.   There were no vitals taken for this visit. Physical Exam Vitals reviewed.  HENT:     Head: Normocephalic.     Nose: Nose normal.  Eyes:     Pupils: Pupils are equal, round, and reactive to light.  Cardiovascular:     Rate and Rhythm: Normal rate.  Pulmonary:     Effort: Pulmonary effort is normal.  Abdominal:     General: Abdomen is flat.  Genitourinary:    Comments: No CVAT at present Musculoskeletal:        General: Normal range of motion.     Cervical back: Normal range of motion.  Skin:     General: Skin is warm.  Neurological:     General: No focal deficit present.     Mental Status: She is alert.  Psychiatric:        Mood and Affect: Mood normal.      Assessment/Plan  Proceed as planned with BILATAERAL ureteroscopic stone manipulation. RIsks, benefits, alternatives, expected peri-op course discussed previously and reiterated today.   Alexis Frock, MD 11/19/2021, 6:47 AM

## 2021-11-19 NOTE — Brief Op Note (Signed)
11/19/2021  3:03 PM  PATIENT:  Laura Gentry  66 y.o. female  PRE-OPERATIVE DIAGNOSIS:  BILATERAL RENAL STONES  POST-OPERATIVE DIAGNOSIS:  bilateral renal stones  PROCEDURE:  Procedure(s) with comments: CYSTOSCOPY WITH RETROGRADE PYELOGRAM, URETEROSCOPY AND STENT EXCHANGE, BASKET STONE REMOVAL (Bilateral) - 75 MINS HOLMIUM LASER APPLICATION (Bilateral)  SURGEON:  Surgeon(s) and Role:    Alexis Frock, MD - Primary  PHYSICIAN ASSISTANT:   ASSISTANTS: none   ANESTHESIA:   general  EBL:  minimal   BLOOD ADMINISTERED:none  DRAINS: none   LOCAL MEDICATIONS USED:  NONE  SPECIMEN:  Source of Specimen:  bilateral renal / ureteral stone fragments  DISPOSITION OF SPECIMEN:   [facility] for compositional analysis  COUNTS:  YES  TOURNIQUET:  * No tourniquets in log *  DICTATION: .Other Dictation: Dictation Number [id number]  PLAN OF CARE: Discharge to home after PACU  PATIENT DISPOSITION:  PACU - hemodynamically stable.   Delay start of Pharmacological VTE agent (>24hrs) due to surgical blood loss or risk of bleeding: not applicable

## 2021-11-19 NOTE — Anesthesia Procedure Notes (Signed)
Procedure Name: LMA Insertion Date/Time: 11/19/2021 2:02 PM  Performed by: Lind Covert, CRNAPre-anesthesia Checklist: Patient identified, Emergency Drugs available, Suction available, Patient being monitored and Timeout performed Patient Re-evaluated:Patient Re-evaluated prior to induction Oxygen Delivery Method: Circle system utilized Induction Type: IV induction LMA: LMA inserted LMA Size: 4.0 Tube type: Oral Number of attempts: 1 Placement Confirmation: positive ETCO2 and breath sounds checked- equal and bilateral Tube secured with: Tape Dental Injury: Teeth and Oropharynx as per pre-operative assessment

## 2021-11-19 NOTE — Transfer of Care (Signed)
Immediate Anesthesia Transfer of Care Note  Patient: Laura Gentry  Procedure(s) Performed: CYSTOSCOPY WITH RETROGRADE PYELOGRAM, URETEROSCOPY AND STENT EXCHANGE, BASKET STONE REMOVAL (Bilateral) HOLMIUM LASER APPLICATION (Bilateral)  Patient Location: PACU  Anesthesia Type:General  Level of Consciousness: sedated  Airway & Oxygen Therapy: Patient Spontanous Breathing and Patient connected to face mask oxygen  Post-op Assessment: Report given to RN and Post -op Vital signs reviewed and stable  Post vital signs: Reviewed and stable  Last Vitals:  Vitals Value Taken Time  BP 134/57 11/19/21 1511  Temp 37.2 C 11/19/21 1511  Pulse 76 11/19/21 1515  Resp 12 11/19/21 1515  SpO2 100 % 11/19/21 1515  Vitals shown include unvalidated device data.  Last Pain:  Vitals:   11/19/21 1334  TempSrc:   PainSc: 0-No pain         Complications: No notable events documented.

## 2021-11-19 NOTE — Anesthesia Preprocedure Evaluation (Addendum)
Anesthesia Evaluation  Patient identified by MRN, date of birth, ID band Patient awake    Reviewed: Allergy & Precautions, NPO status , Patient's Chart, lab work & pertinent test results, reviewed documented beta blocker date and time   History of Anesthesia Complications Negative for: history of anesthetic complications  Airway Mallampati: I  TM Distance: >3 FB Neck ROM: Full    Dental  (+) Edentulous Upper, Edentulous Lower   Pulmonary former smoker,    breath sounds clear to auscultation       Cardiovascular hypertension, Pt. on medications and Pt. on home beta blockers (-) angina Rhythm:Regular Rate:Normal     Neuro/Psych  Headaches, H/o cerebral aneurysm negative psych ROS   GI/Hepatic Neg liver ROS, GERD  Medicated and Controlled,  Endo/Other  diabetes (glu 219), Insulin DependentHypothyroidism   Renal/GU Renal InsufficiencyRenal disease     Musculoskeletal  (+) Arthritis ,   Abdominal   Peds  Hematology   Anesthesia Other Findings   Reproductive/Obstetrics                            Anesthesia Physical Anesthesia Plan  ASA: 3  Anesthesia Plan: General   Post-op Pain Management: Tylenol PO (pre-op)*   Induction: Intravenous  PONV Risk Score and Plan: 3 and Ondansetron, Dexamethasone and Treatment may vary due to age or medical condition  Airway Management Planned: LMA  Additional Equipment: None  Intra-op Plan:   Post-operative Plan:   Informed Consent: I have reviewed the patients History and Physical, chart, labs and discussed the procedure including the risks, benefits and alternatives for the proposed anesthesia with the patient or authorized representative who has indicated his/her understanding and acceptance.       Plan Discussed with: Surgeon and CRNA  Anesthesia Plan Comments:         Anesthesia Quick Evaluation

## 2021-11-19 NOTE — Discharge Instructions (Signed)
1 - You may have urinary urgency (bladder spasms) and bloody urine on / off with stent in place. This is normal.  2 - Remove tethered stents on Friday morning at home by pulling on string, then blue-white plastic tubing and discarding. Office is open Friday if any problems arise.   3- Call MD or go to ER for fever >102, severe pain / nausea / vomiting not relieved by medications, or acute change in medical status

## 2021-11-19 NOTE — Anesthesia Postprocedure Evaluation (Signed)
Anesthesia Post Note  Patient: Kourtnie Sachs  Procedure(s) Performed: CYSTOSCOPY WITH RETROGRADE PYELOGRAM, URETEROSCOPY AND STENT EXCHANGE, BASKET STONE REMOVAL (Bilateral) HOLMIUM LASER APPLICATION (Bilateral)     Patient location during evaluation: PACU Anesthesia Type: General Level of consciousness: awake and alert, patient cooperative and oriented Pain management: pain level controlled Vital Signs Assessment: post-procedure vital signs reviewed and stable Respiratory status: spontaneous breathing, nonlabored ventilation and respiratory function stable Cardiovascular status: blood pressure returned to baseline and stable Postop Assessment: no apparent nausea or vomiting Anesthetic complications: no   No notable events documented.  Last Vitals:  Vitals:   11/19/21 1515 11/19/21 1530  BP: 135/66 138/71  Pulse: 76 76  Resp: 12 18  Temp:    SpO2: 100% 100%    Last Pain:  Vitals:   11/19/21 1530  TempSrc:   PainSc: 0-No pain                 Sharnese Heath,E. Rosielee Corporan

## 2021-11-20 ENCOUNTER — Encounter (HOSPITAL_COMMUNITY): Payer: Self-pay | Admitting: Urology

## 2021-11-20 NOTE — Op Note (Signed)
NAME: Laura Gentry, Laura Gentry MEDICAL RECORD NO: [id number] ACCOUNT NO: [id number] DATE OF BIRTH: 12-Jun-1955 FACILITY: [facility] LOCATION: [facility] PHYSICIAN: Alexis Frock, MD  Operative Report   DATE OF PROCEDURE: 11/19/2021  PREOPERATIVE DIAGNOSES:  Right ureteral, bilateral renal stones, history of bacteriuria.  PROCEDURE PERFORMED:   1.  Cystoscopy with bilateral retrograde pyelograms, interpretation. 2.  Bilateral ureteroscopy with laser lithotripsy. 3.  Exchange of bilateral ureteral stents.  ESTIMATED BLOOD LOSS:  Nil.  COMPLICATIONS:  None.  SPECIMEN:  Right ureteral, bilateral renal stone fragments for composition analysis.  FINDINGS:   1.  Right mid ureteral stone. 2.  Right greater than left intrarenal stones. 3.  Complete resolution of all accessible stone fragments larger than one-third mm following laser lithotripsy and basket extraction. 4.  Successful replacement of bilateral ureteral stents, proximal end in the renal pelvis, distal end in urinary bladder with tether.  INDICATIONS:  The patient is a pleasant 66 year old lady who was found on stone workup of colicky flank pain and a low-grade fever to have right distal ureteral stone as well as significant volume bilateral renal stones.  She underwent stenting in the  acute setting as temporizing measure.  She has since cleared her infectious parameters and presents for definitive stone management today for ureteroscopy with goal of stone free.  Informed consent was obtained and placed in medical record.  PROCEDURE IN DETAIL:  The patient being herself verified, procedure being bilateral ureteroscopic stone manipulation was confirmed.  Procedure timeout was performed.  Intravenous antibiotics were administered.  General anesthesia was introduced.  The  patient was placed into a low lithotomy position.  Sterile field was created, prepped and draped the patient's vagina, introitus, and proximal thighs using iodine.  Exquisite care  was taken to avoid any excessive angulation and rotation of her left hip  given recent left hip surgery.  Cystourethroscopy was performed using 21-French rigid cystoscope with offset lens.  Inspection of urinary bladder revealed distal end of bilateral ureteral stents in situ.  No papillary lesions or calcifications noted.   Distal end of the left stent was grasped, brought to the level of the urethral meatus.  A 0.038 ZIPwire was advanced to level of lower pole.  The stent was exchanged for an open-ended catheter and left retrograde pyelogram was obtained.  Left retrograde pyelogram demonstrated single left ureter, single system left kidney.  No filling defects or narrowing noted.  ZIPwire was once again advanced and set aside as a safety wire.  Next, the distal end of the right stent was grasped, brought  to the level of the urethral meatus, exchanged for an open-ended catheter via the ZIPwire and right retrograde pyelogram was obtained.  Right retrograde pyelogram demonstrated single right ureter, single system right kidney.  No obvious filling defects or narrowing noted.  A ZIPwire was once again advanced, set aside as a safety wire.  An 8-French feeding tube placed in the urinary  bladder for pressure release and semirigid ureteroscopy performed of distal right ureter alongside a separate sensor working wire. In the mid ureter, the dominant stone appeared to just somewhat fusiform and amenable to basketing. It was carefully  grasped on its long axis with the Escape basket and navigated out in its entirety, set aside for composition analysis.  Repeat ureteroscopy in the right side up to the distal four-fifths of the right ureter revealed no additional calcifications.  Next,  semirigid ureteroscopy performed of the distal four-fifths of left ureter alongside a separate sensor working wire.  No mucosal abnormalities were found.  Next, a short length ureteral access sheath was placed over the right sensor  working wire to the  level of proximal right ureter using continuous fluoroscopic guidance and flexible digital ureteroscopy was performed of the proximal right ureter and systematic inspection of the right kidney, including all calices x3.  There was a single dominant  calcification noted in a lower mid calix.  This was much too large for simple basketing.  Holmium laser energy applied to stone using 0.3 joules and 30 Hz.  Approximately 30% of the stone was dusted, 70% fragmented with fragments then being amenable to  simple basketing with the Escape basket.  Following this, complete resolution of all accessible stone fragments larger than one-third mm.  Excellent hemostasis, no evidence of perforation. Access sheath was removed under continuous vision.  No  significant mucosal abnormalities on the right side.  Next, the access sheath placed over the left sensor working wire to the level of the proximal left ureter using continuous fluoroscopic guidance and flexible digital ureteroscopy was performed of the  proximal left ureter and systematic inspection of the left kidney.  There were a few punctate calcifications noted, one upper, one mid, and one lower.  Two of these were papillary tip, almost free floating. The free floating stone was amenable to simple  basketing. The papillary tip calcifications were ablated with holmium laser energy using settings of 1 joule and 10 Hz.  This resulted in complete resolution of all accessible stone fragments larger than one-third mm.  Access sheath was removed under  continuous vision.  No significant mucosal abnormalities were found.  Given the bilateral nature of the procedure today, it was felt that brief interval stenting with tethered stent seemed to be most prudent.  As such, a new 5 x 22 Polaris type stents  were placed over the safety wire using fluoroscopic guidance.  Good proximal and distal planes were noted.  Tethers were left in place, trimmed to length  and tucked per vagina and fashioned together and the procedure was terminated.  The patient  tolerated the procedure well, no immediate periprocedural complications.  The patient was taken to [facility] in stable condition with plan for discharge home today.     PAA D: 11/19/2021 3:09:43 pm T: 11/20/2021 2:31:00 am  JOB: [id number]/ [id number]

## 2021-11-21 ENCOUNTER — Emergency Department (HOSPITAL_COMMUNITY): Payer: Medicare HMO

## 2021-11-21 ENCOUNTER — Encounter (HOSPITAL_COMMUNITY): Payer: Self-pay

## 2021-11-21 ENCOUNTER — Inpatient Hospital Stay (HOSPITAL_COMMUNITY)
Admission: EM | Admit: 2021-11-21 | Discharge: 2021-11-25 | DRG: 659 | Disposition: A | Discharge status: Home Health | Payer: Medicare HMO | Attending: Internal Medicine | Admitting: Internal Medicine

## 2021-11-21 ENCOUNTER — Other Ambulatory Visit: Payer: Self-pay

## 2021-11-21 DIAGNOSIS — Y733 Surgical instruments, materials and gastroenterology and urology devices (including sutures) associated with adverse incidents: Secondary | ICD-10-CM | POA: Diagnosis present

## 2021-11-21 DIAGNOSIS — S72002D Fracture of unspecified part of neck of left femur, subsequent encounter for closed fracture with routine healing: Secondary | ICD-10-CM | POA: Diagnosis not present

## 2021-11-21 DIAGNOSIS — Z7983 Long term (current) use of bisphosphonates: Secondary | ICD-10-CM | POA: Diagnosis not present

## 2021-11-21 DIAGNOSIS — Z7982 Long term (current) use of aspirin: Secondary | ICD-10-CM

## 2021-11-21 DIAGNOSIS — N3001 Acute cystitis with hematuria: Secondary | ICD-10-CM | POA: Diagnosis not present

## 2021-11-21 DIAGNOSIS — Z800 Family history of malignant neoplasm of digestive organs: Secondary | ICD-10-CM

## 2021-11-21 DIAGNOSIS — R4182 Altered mental status, unspecified: Secondary | ICD-10-CM

## 2021-11-21 DIAGNOSIS — E1110 Type 2 diabetes mellitus with ketoacidosis without coma: Secondary | ICD-10-CM | POA: Diagnosis present

## 2021-11-21 DIAGNOSIS — Z20822 Contact with and (suspected) exposure to covid-19: Secondary | ICD-10-CM | POA: Diagnosis present

## 2021-11-21 DIAGNOSIS — I951 Orthostatic hypotension: Secondary | ICD-10-CM | POA: Diagnosis present

## 2021-11-21 DIAGNOSIS — R519 Headache, unspecified: Secondary | ICD-10-CM | POA: Diagnosis not present

## 2021-11-21 DIAGNOSIS — Z794 Long term (current) use of insulin: Secondary | ICD-10-CM

## 2021-11-21 DIAGNOSIS — Z882 Allergy status to sulfonamides status: Secondary | ICD-10-CM | POA: Diagnosis not present

## 2021-11-21 DIAGNOSIS — E1142 Type 2 diabetes mellitus with diabetic polyneuropathy: Secondary | ICD-10-CM

## 2021-11-21 DIAGNOSIS — Z808 Family history of malignant neoplasm of other organs or systems: Secondary | ICD-10-CM

## 2021-11-21 DIAGNOSIS — E7800 Pure hypercholesterolemia, unspecified: Secondary | ICD-10-CM | POA: Diagnosis present

## 2021-11-21 DIAGNOSIS — E782 Mixed hyperlipidemia: Secondary | ICD-10-CM | POA: Diagnosis not present

## 2021-11-21 DIAGNOSIS — I10 Essential (primary) hypertension: Secondary | ICD-10-CM | POA: Diagnosis present

## 2021-11-21 DIAGNOSIS — S42402D Unspecified fracture of lower end of left humerus, subsequent encounter for fracture with routine healing: Secondary | ICD-10-CM | POA: Diagnosis not present

## 2021-11-21 DIAGNOSIS — M542 Cervicalgia: Secondary | ICD-10-CM | POA: Diagnosis not present

## 2021-11-21 DIAGNOSIS — K760 Fatty (change of) liver, not elsewhere classified: Secondary | ICD-10-CM | POA: Diagnosis not present

## 2021-11-21 DIAGNOSIS — Z825 Family history of asthma and other chronic lower respiratory diseases: Secondary | ICD-10-CM

## 2021-11-21 DIAGNOSIS — Z85828 Personal history of other malignant neoplasm of skin: Secondary | ICD-10-CM

## 2021-11-21 DIAGNOSIS — Z801 Family history of malignant neoplasm of trachea, bronchus and lung: Secondary | ICD-10-CM

## 2021-11-21 DIAGNOSIS — Z79899 Other long term (current) drug therapy: Secondary | ICD-10-CM | POA: Diagnosis not present

## 2021-11-21 DIAGNOSIS — M810 Age-related osteoporosis without current pathological fracture: Secondary | ICD-10-CM | POA: Diagnosis present

## 2021-11-21 DIAGNOSIS — Z91030 Bee allergy status: Secondary | ICD-10-CM

## 2021-11-21 DIAGNOSIS — Z887 Allergy status to serum and vaccine status: Secondary | ICD-10-CM | POA: Diagnosis not present

## 2021-11-21 DIAGNOSIS — W19XXXD Unspecified fall, subsequent encounter: Secondary | ICD-10-CM | POA: Diagnosis present

## 2021-11-21 DIAGNOSIS — K219 Gastro-esophageal reflux disease without esophagitis: Secondary | ICD-10-CM | POA: Diagnosis present

## 2021-11-21 DIAGNOSIS — E785 Hyperlipidemia, unspecified: Secondary | ICD-10-CM | POA: Diagnosis present

## 2021-11-21 DIAGNOSIS — Z8781 Personal history of (healed) traumatic fracture: Secondary | ICD-10-CM

## 2021-11-21 DIAGNOSIS — T83593A Infection and inflammatory reaction due to other urinary stents, initial encounter: Secondary | ICD-10-CM | POA: Diagnosis present

## 2021-11-21 DIAGNOSIS — N390 Urinary tract infection, site not specified: Secondary | ICD-10-CM | POA: Diagnosis not present

## 2021-11-21 DIAGNOSIS — Z803 Family history of malignant neoplasm of breast: Secondary | ICD-10-CM

## 2021-11-21 DIAGNOSIS — A419 Sepsis, unspecified organism: Secondary | ICD-10-CM | POA: Diagnosis present

## 2021-11-21 DIAGNOSIS — R000 Tachycardia, unspecified: Secondary | ICD-10-CM | POA: Diagnosis not present

## 2021-11-21 DIAGNOSIS — G9341 Metabolic encephalopathy: Secondary | ICD-10-CM

## 2021-11-21 DIAGNOSIS — Z8249 Family history of ischemic heart disease and other diseases of the circulatory system: Secondary | ICD-10-CM

## 2021-11-21 DIAGNOSIS — N12 Tubulo-interstitial nephritis, not specified as acute or chronic: Secondary | ICD-10-CM | POA: Diagnosis present

## 2021-11-21 DIAGNOSIS — Z87891 Personal history of nicotine dependence: Secondary | ICD-10-CM

## 2021-11-21 DIAGNOSIS — R739 Hyperglycemia, unspecified: Secondary | ICD-10-CM | POA: Diagnosis not present

## 2021-11-21 DIAGNOSIS — M25552 Pain in left hip: Secondary | ICD-10-CM | POA: Diagnosis not present

## 2021-11-21 DIAGNOSIS — Z880 Allergy status to penicillin: Secondary | ICD-10-CM

## 2021-11-21 DIAGNOSIS — N200 Calculus of kidney: Secondary | ICD-10-CM | POA: Diagnosis not present

## 2021-11-21 DIAGNOSIS — E876 Hypokalemia: Secondary | ICD-10-CM | POA: Diagnosis not present

## 2021-11-21 DIAGNOSIS — N281 Cyst of kidney, acquired: Secondary | ICD-10-CM | POA: Diagnosis not present

## 2021-11-21 DIAGNOSIS — Z833 Family history of diabetes mellitus: Secondary | ICD-10-CM

## 2021-11-21 LAB — BASIC METABOLIC PANEL
Anion gap: 15 (ref 5–15)
Anion gap: 10 (ref 5–15)
Anion gap: 9 (ref 5–15)
BUN: 14 mg/dL (ref 8–23)
BUN: 13 mg/dL (ref 8–23)
BUN: 12 mg/dL (ref 8–23)
CO2: 24 mmol/L (ref 22–32)
CO2: 25 mmol/L (ref 22–32)
CO2: 24 mmol/L (ref 22–32)
Calcium: 8.9 mg/dL (ref 8.9–10.3)
Calcium: 8.6 mg/dL — ABNORMAL LOW (ref 8.9–10.3)
Calcium: 8.4 mg/dL — ABNORMAL LOW (ref 8.9–10.3)
Chloride: 95 mmol/L — ABNORMAL LOW (ref 98–111)
Chloride: 99 mmol/L (ref 98–111)
Chloride: 100 mmol/L (ref 98–111)
Creatinine, Ser: 0.89 mg/dL (ref 0.44–1.00)
Creatinine, Ser: 0.88 mg/dL (ref 0.44–1.00)
Creatinine, Ser: 0.74 mg/dL (ref 0.44–1.00)
GFR, Estimated: >60 mL/min (ref >60)
GFR, Estimated: >60 mL/min (ref >60)
GFR, Estimated: >60 mL/min (ref >60)
Glucose, Bld: 262 mg/dL — ABNORMAL HIGH (ref 70–99)
Glucose, Bld: 204 mg/dL — ABNORMAL HIGH (ref 70–99)
Glucose, Bld: 155 mg/dL — ABNORMAL HIGH (ref 70–99)
Potassium: 3.8 mmol/L (ref 3.5–5.1)
Potassium: 3.3 mmol/L — ABNORMAL LOW (ref 3.5–5.1)
Potassium: 3 mmol/L — ABNORMAL LOW (ref 3.5–5.1)
Sodium: 134 mmol/L — ABNORMAL LOW (ref 135–145)
Sodium: 134 mmol/L — ABNORMAL LOW (ref 135–145)
Sodium: 133 mmol/L — ABNORMAL LOW (ref 135–145)

## 2021-11-21 LAB — COMPREHENSIVE METABOLIC PANEL
ALT: 14 U/L (ref 0–44)
AST: 17 U/L (ref 15–41)
Albumin: 3 g/dL — ABNORMAL LOW (ref 3.5–5.0)
Alkaline Phosphatase: 104 U/L (ref 38–126)
Anion gap: 21 — ABNORMAL HIGH (ref 5–15)
BUN: 16 mg/dL (ref 8–23)
CO2: 16 mmol/L — ABNORMAL LOW (ref 22–32)
Calcium: 9.1 mg/dL (ref 8.9–10.3)
Chloride: 95 mmol/L — ABNORMAL LOW (ref 98–111)
Creatinine, Ser: 0.96 mg/dL (ref 0.44–1.00)
GFR, Estimated: >60 mL/min (ref >60)
Glucose, Bld: 303 mg/dL — ABNORMAL HIGH (ref 70–99)
Potassium: 4 mmol/L (ref 3.5–5.1)
Sodium: 132 mmol/L — ABNORMAL LOW (ref 135–145)
Total Bilirubin: 1.1 mg/dL (ref 0.3–1.2)
Total Protein: 6.8 g/dL (ref 6.5–8.1)

## 2021-11-21 LAB — I-STAT ARTERIAL BLOOD GAS, ED
Acid-Base Excess: 3 mmol/L — ABNORMAL HIGH (ref 0.0–2.0)
Bicarbonate: 24.8 mmol/L (ref 20.0–28.0)
Calcium, Ion: 1.18 mmol/L (ref 1.15–1.40)
HCT: 29 % — ABNORMAL LOW (ref 36.0–46.0)
Hemoglobin: 9.9 g/dL — ABNORMAL LOW (ref 12.0–15.0)
O2 Saturation: 97 %
Patient temperature: 101.8
Potassium: 3.3 mmol/L — ABNORMAL LOW (ref 3.5–5.1)
Sodium: 133 mmol/L — ABNORMAL LOW (ref 135–145)
TCO2: 26 mmol/L (ref 22–32)
pCO2 arterial: 30.5 mmHg — ABNORMAL LOW (ref 32–48)
pH, Arterial: 7.524 — ABNORMAL HIGH (ref 7.35–7.45)
pO2, Arterial: 84 mmHg (ref 83–108)

## 2021-11-21 LAB — I-STAT CHEM 8, ED
BUN: 16 mg/dL (ref 8–23)
Calcium, Ion: 1.08 mmol/L — ABNORMAL LOW (ref 1.15–1.40)
Chloride: 95 mmol/L — ABNORMAL LOW (ref 98–111)
Creatinine, Ser: 0.7 mg/dL (ref 0.44–1.00)
Glucose, Bld: 315 mg/dL — ABNORMAL HIGH (ref 70–99)
HCT: 35 % — ABNORMAL LOW (ref 36.0–46.0)
Hemoglobin: 11.9 g/dL — ABNORMAL LOW (ref 12.0–15.0)
Potassium: 3.6 mmol/L (ref 3.5–5.1)
Sodium: 131 mmol/L — ABNORMAL LOW (ref 135–145)
TCO2: 21 mmol/L — ABNORMAL LOW (ref 22–32)

## 2021-11-21 LAB — I-STAT VENOUS BLOOD GAS, ED
Acid-Base Excess: 2 mmol/L (ref 0.0–2.0)
Bicarbonate: 23.4 mmol/L (ref 20.0–28.0)
Calcium, Ion: 1.09 mmol/L — ABNORMAL LOW (ref 1.15–1.40)
HCT: 35 % — ABNORMAL LOW (ref 36.0–46.0)
Hemoglobin: 11.9 g/dL — ABNORMAL LOW (ref 12.0–15.0)
O2 Saturation: 90 %
Potassium: 3.6 mmol/L (ref 3.5–5.1)
Sodium: 131 mmol/L — ABNORMAL LOW (ref 135–145)
TCO2: 24 mmol/L (ref 22–32)
pCO2, Ven: 26.6 mmHg — ABNORMAL LOW (ref 44–60)
pH, Ven: 7.552 — ABNORMAL HIGH (ref 7.25–7.43)
pO2, Ven: 50 mmHg — ABNORMAL HIGH (ref 32–45)

## 2021-11-21 LAB — URINALYSIS, ROUTINE W REFLEX MICROSCOPIC
Bilirubin Urine: NEGATIVE
Glucose, UA: >=500 mg/dL — AB
Ketones, ur: 80 mg/dL — AB
Nitrite: NEGATIVE
Protein, ur: 100 mg/dL — AB
RBC / HPF: >50 RBC/hpf — ABNORMAL HIGH (ref 0–5)
Specific Gravity, Urine: 1.016 (ref 1.005–1.030)
pH: 6 (ref 5.0–8.0)

## 2021-11-21 LAB — PROTIME-INR
INR: 1.1 (ref 0.8–1.2)
Prothrombin Time: 14.4 seconds (ref 11.4–15.2)

## 2021-11-21 LAB — CBC WITH DIFFERENTIAL/PLATELET
Abs Immature Granulocytes: 0.04 10*3/uL (ref 0.00–0.07)
Basophils Absolute: 0 10*3/uL (ref 0.0–0.1)
Basophils Relative: 0 %
Eosinophils Absolute: 0 10*3/uL (ref 0.0–0.5)
Eosinophils Relative: 0 %
HCT: 38.3 % (ref 36.0–46.0)
Hemoglobin: 11.6 g/dL — ABNORMAL LOW (ref 12.0–15.0)
Immature Granulocytes: 1 %
Lymphocytes Relative: 8 %
Lymphs Abs: 0.7 10*3/uL (ref 0.7–4.0)
MCH: 24.9 pg — ABNORMAL LOW (ref 26.0–34.0)
MCHC: 30.3 g/dL (ref 30.0–36.0)
MCV: 82.4 fL (ref 80.0–100.0)
Monocytes Absolute: 0.6 10*3/uL (ref 0.1–1.0)
Monocytes Relative: 7 %
Neutro Abs: 7.4 10*3/uL (ref 1.7–7.7)
Neutrophils Relative %: 84 %
Platelets: 300 10*3/uL (ref 150–400)
RBC: 4.65 MIL/uL (ref 3.87–5.11)
RDW: 14.2 % (ref 11.5–15.5)
WBC: 8.7 10*3/uL (ref 4.0–10.5)
nRBC: 0 % (ref 0.0–0.2)

## 2021-11-21 LAB — RESP PANEL BY RT-PCR (FLU A&B, COVID) ARPGX2
Influenza A by PCR: NEGATIVE
Influenza B by PCR: NEGATIVE
SARS Coronavirus 2 by RT PCR: NEGATIVE

## 2021-11-21 LAB — CBG MONITORING, ED
Glucose-Capillary: 302 mg/dL — ABNORMAL HIGH (ref 70–99)
Glucose-Capillary: 231 mg/dL — ABNORMAL HIGH (ref 70–99)
Glucose-Capillary: 218 mg/dL — ABNORMAL HIGH (ref 70–99)
Glucose-Capillary: 214 mg/dL — ABNORMAL HIGH (ref 70–99)
Glucose-Capillary: 167 mg/dL — ABNORMAL HIGH (ref 70–99)
Glucose-Capillary: 149 mg/dL — ABNORMAL HIGH (ref 70–99)

## 2021-11-21 LAB — OSMOLALITY: Osmolality: 291 mOsm/kg (ref 275–295)

## 2021-11-21 LAB — LACTIC ACID, PLASMA: Lactic Acid, Venous: 1.7 mmol/L (ref 0.5–1.9)

## 2021-11-21 LAB — BETA-HYDROXYBUTYRIC ACID: Beta-Hydroxybutyric Acid: 0.19 mmol/L (ref 0.05–0.27)

## 2021-11-21 LAB — MAGNESIUM: Magnesium: 1.4 mg/dL — ABNORMAL LOW (ref 1.7–2.4)

## 2021-11-21 LAB — APTT: aPTT: 28 seconds (ref 24–36)

## 2021-11-21 LAB — CK: Total CK: 20 U/L — ABNORMAL LOW (ref 38–234)

## 2021-11-21 MED ORDER — DULOXETINE HCL 60 MG PO CPEP
60.0000 mg | ORAL_CAPSULE | Freq: Every day | ORAL | Status: DC
  Administered 2021-11-21 – 2021-11-25 (×5): 60 mg via ORAL
  Filled 2021-11-21: qty 2
  Filled 2021-11-21 (×4): qty 1

## 2021-11-21 MED ORDER — GABAPENTIN 600 MG PO TABS
300.0000 mg | ORAL_TABLET | Freq: Two times a day (BID) | ORAL | Status: DC
  Administered 2021-11-22 – 2021-11-25 (×8): 300 mg via ORAL
  Filled 2021-11-21 (×2): qty 1
  Filled 2021-11-21: qty 0.5
  Filled 2021-11-21 (×6): qty 1

## 2021-11-21 MED ORDER — MORPHINE SULFATE (PF) 2 MG/ML IV SOLN
2.0000 mg | INTRAVENOUS | Status: DC | PRN
  Administered 2021-11-22: 2 mg via INTRAVENOUS
  Filled 2021-11-21: qty 1

## 2021-11-21 MED ORDER — ACETAMINOPHEN 650 MG RE SUPP
650.0000 mg | Freq: Once | RECTAL | Status: AC
  Administered 2021-11-21: 650 mg via RECTAL
  Filled 2021-11-21: qty 1

## 2021-11-21 MED ORDER — ATORVASTATIN CALCIUM 40 MG PO TABS
40.0000 mg | ORAL_TABLET | Freq: Every day | ORAL | Status: DC
  Administered 2021-11-21 – 2021-11-25 (×5): 40 mg via ORAL
  Filled 2021-11-21 (×5): qty 1

## 2021-11-21 MED ORDER — POTASSIUM CHLORIDE 10 MEQ/100ML IV SOLN
10.0000 meq | INTRAVENOUS | Status: AC
  Administered 2021-11-21 (×2): 10 meq via INTRAVENOUS
  Filled 2021-11-21 (×2): qty 100

## 2021-11-21 MED ORDER — LACTATED RINGERS IV SOLN: INTRAVENOUS | Status: DC

## 2021-11-21 MED ORDER — PROPRANOLOL HCL ER 60 MG PO CP24
120.0000 mg | ORAL_CAPSULE | Freq: Every day | ORAL | Status: DC
  Administered 2021-11-21 – 2021-11-22 (×2): 120 mg via ORAL
  Filled 2021-11-21: qty 2
  Filled 2021-11-21: qty 1
  Filled 2021-11-21: qty 2

## 2021-11-21 MED ORDER — DEXTROSE 50 % IV SOLN: 0.0000 mL | INTRAVENOUS | Status: DC | PRN

## 2021-11-21 MED ORDER — ONDANSETRON HCL 4 MG/2ML IJ SOLN
4.0000 mg | Freq: Four times a day (QID) | INTRAMUSCULAR | Status: DC | PRN
  Administered 2021-11-21: 4 mg via INTRAVENOUS
  Filled 2021-11-21: qty 2

## 2021-11-21 MED ORDER — VITAMIN B-12 1000 MCG PO TABS
1000.0000 ug | ORAL_TABLET | Freq: Every day | ORAL | Status: DC
  Administered 2021-11-21 – 2021-11-25 (×5): 1000 ug via ORAL
  Filled 2021-11-21 (×5): qty 1

## 2021-11-21 MED ORDER — PIPERACILLIN-TAZOBACTAM 3.375 G IVPB 30 MIN
3.3750 g | Freq: Once | INTRAVENOUS | Status: AC
  Administered 2021-11-21: 3.375 g via INTRAVENOUS
  Filled 2021-11-21: qty 50

## 2021-11-21 MED ORDER — IOHEXOL 300 MG/ML  SOLN
85.0000 mL | Freq: Once | INTRAMUSCULAR | Status: AC | PRN
  Administered 2021-11-21: 85 mL via INTRAVENOUS

## 2021-11-21 MED ORDER — MORPHINE SULFATE (PF) 2 MG/ML IV SOLN
1.0000 mg | Freq: Once | INTRAVENOUS | Status: AC
  Administered 2021-11-21: 1 mg via INTRAVENOUS
  Filled 2021-11-21: qty 1

## 2021-11-21 MED ORDER — ENOXAPARIN SODIUM 40 MG/0.4ML IJ SOSY
40.0000 mg | PREFILLED_SYRINGE | INTRAMUSCULAR | Status: DC
  Administered 2021-11-21 – 2021-11-24 (×4): 40 mg via SUBCUTANEOUS
  Filled 2021-11-21 (×4): qty 0.4

## 2021-11-21 MED ORDER — LACTATED RINGERS IV BOLUS
1000.0000 mL | Freq: Once | INTRAVENOUS | Status: AC
  Administered 2021-11-21: 1000 mL via INTRAVENOUS

## 2021-11-21 MED ORDER — INSULIN REGULAR(HUMAN) IN NACL 100-0.9 UT/100ML-% IV SOLN
INTRAVENOUS | Status: DC
  Administered 2021-11-21: 5.5 [IU]/h via INTRAVENOUS
  Administered 2021-11-21: 5 [IU]/h via INTRAVENOUS
  Filled 2021-11-21: qty 100

## 2021-11-21 MED ORDER — PANTOPRAZOLE SODIUM 40 MG PO TBEC
40.0000 mg | DELAYED_RELEASE_TABLET | Freq: Every day | ORAL | Status: DC
  Administered 2021-11-21 – 2021-11-25 (×5): 40 mg via ORAL
  Filled 2021-11-21 (×5): qty 1

## 2021-11-21 MED ORDER — ONDANSETRON HCL 4 MG/2ML IJ SOLN
4.0000 mg | Freq: Once | INTRAMUSCULAR | Status: AC
Start: 2021-11-21 — End: 2021-11-21
  Administered 2021-11-21: 4 mg via INTRAVENOUS
  Filled 2021-11-21: qty 2

## 2021-11-21 MED ORDER — DEXTROSE IN LACTATED RINGERS 5 % IV SOLN: INTRAVENOUS | Status: DC

## 2021-11-21 MED ORDER — TRAZODONE HCL 50 MG PO TABS
50.0000 mg | ORAL_TABLET | Freq: Every evening | ORAL | Status: DC | PRN
  Administered 2021-11-22 – 2021-11-24 (×3): 50 mg via ORAL
  Filled 2021-11-21 (×3): qty 1

## 2021-11-21 MED ORDER — VANCOMYCIN HCL 1500 MG/300ML IV SOLN
1500.0000 mg | Freq: Once | INTRAVENOUS | Status: AC
  Administered 2021-11-21: 1500 mg via INTRAVENOUS
  Filled 2021-11-21: qty 300

## 2021-11-21 MED ORDER — SODIUM CHLORIDE 0.9 % IV SOLN
1.0000 g | INTRAVENOUS | Status: DC
  Administered 2021-11-22: 1 g via INTRAVENOUS
  Filled 2021-11-21: qty 10

## 2021-11-21 MED ORDER — GABAPENTIN 600 MG PO TABS
600.0000 mg | ORAL_TABLET | Freq: Every day | ORAL | Status: DC
  Administered 2021-11-21 – 2021-11-24 (×4): 600 mg via ORAL
  Filled 2021-11-21 (×5): qty 1

## 2021-11-21 MED ORDER — ASPIRIN 325 MG PO TABS
325.0000 mg | ORAL_TABLET | Freq: Every day | ORAL | Status: DC
  Administered 2021-11-21: 325 mg via ORAL
  Filled 2021-11-21 (×2): qty 1

## 2021-11-21 MED ORDER — GABAPENTIN 300 MG PO CAPS: 300.0000 mg | ORAL_CAPSULE | ORAL | Status: DC

## 2021-11-21 MED ORDER — ACETAMINOPHEN 500 MG PO TABS
1000.0000 mg | ORAL_TABLET | Freq: Four times a day (QID) | ORAL | Status: DC | PRN
Start: 2021-11-21 — End: 2021-11-25
  Administered 2021-11-22: 1000 mg via ORAL
  Filled 2021-11-21: qty 2

## 2021-11-21 NOTE — Inpatient Diabetes Management (Signed)
Inpatient Diabetes Program Recommendations  AACE/ADA: New Consensus Statement on Inpatient Glycemic Control (2015)  Target Ranges:  Prepandial:   less than 140 mg/dL      Peak postprandial:   less than 180 mg/dL (1-2 hours)      Critically ill patients:  140 - 180 mg/dL   Lab Results  Component Value Date   GLUCAP 302 (H) 11/21/2021   HGBA1C 8.8 (H) 10/29/2021    Review of Glycemic Control  Diabetes history: DM2 Outpatient Diabetes medications: Lantus 25 units QHS, Invokomet XR [phone] with breakfast Current orders for Inpatient glycemic control: none  Inpatient Diabetes Program Recommendations:    Lantus 20 units QHS Novolog 0-15 units Q4H while NPO  Was recently discharged on 11/07/21.  Will continue to follow while inpatient.  Thank you, Reche Dixon, MSN, [facility] Coordinator Inpatient Diabetes Program [phone] (team pager from 8a-5p)

## 2021-11-21 NOTE — ED Notes (Signed)
Patient transported to X-ray 

## 2021-11-21 NOTE — H&P (Signed)
History and Physical    Patient: Laura Gentry XIP:[id number] DOB: 07/10/1955 DOA: 11/21/2021 DOS: the patient was seen and examined on 11/21/2021 PCP: Seward Carol, MD  Patient coming from: Home  Chief Complaint:  Chief Complaint  Patient presents with   Altered Mental Status   HPI: Laura Gentry is a 66 y.o. female with medical history significant of insulin-dependent Type 2DM, hypothyroidism, brain aneurysm s/p clipping, nephrolithiasis presenting with AMS.   She recently had bilateral utreteroscopic stone manipulation with urology Dr. Tresa Moore on 6/28 following hospitalized earlier in the month with severe sepsis for obstructive right kidney and underwent cystoscopy and bilateral stent placement. The day following the procedure she felt unwell and weak. Has not been able to take her medications or insulin since before the procedure. She lives with her sister and mother but mother was admitted in the hospital last night and sister was in the hospital with her. This morning appeared confused to sister over the phone. Niece was sent to home and found pt laying on the floor and confused. Pt just knows that she could not get up but does not recall how she fell.  Has been nauseous with vomiting today. Feel vaginal burning and diffuse abdominal pain.   Of note she was admitted 2 weeks ago for fall with left impacted femoral neck fracture s/p hip cannulation on 6/13 and left elbow fracture    In the ED, she was febrile up to 101.70F, tachycardic, tachypnea on room air. No leukocytosis, Hgb of 9.9 down from 11.  pH of 7.5, CO2 30 with BG of 303 and anion gap of 21.  Normal creatinine is 0.96.  UA with trace leukocyte, negative nitrite and few bacteria is, greater than 500 glucose. CK of 20  CT head and cervical spine is normal. CT abdomen/pelvis with bilateral nonobstructing nephrolithiasis, enhancement of urethral wall concerning for UTI versus inflammatory changes from recent stents.   Small amount of gas within the urinary bladder may be related to infection.  Negative flu and COVID.    Review of Systems: unable to review all systems due to the inability of the patient to answer questions. Past Medical History:  Diagnosis Date   Brain aneurysm 03/19/2013   Cataracts, bilateral    Cerebral aneurysm    Colon polyps    Diabetes ([place]) 2013   High cholesterol    History of kidney stones    Insomnia    Lumbosacral radiculopathy    Osteoporosis    Skin cancer    squamous   Thyroid disease    Tremor    Past Surgical History:  Procedure Laterality Date   APPENDECTOMY     BRAIN SURGERY  03/19/13   [place] aneyrsum clip can have MRI up to [place] scanned in    CHOLECYSTECTOMY  2013   [place], URETEROSCOPY AND STENT PLACEMENT Bilateral 10/29/2021   Procedure: [place], URETEROSCOPY AND STENT PLACEMENT;  Surgeon: Alexis Frock, MD;  Location: [facility];  Service: Urology;  Laterality: Bilateral;   CYSTOSCOPY WITH RETROGRADE PYELOGRAM, URETEROSCOPY AND STENT PLACEMENT Bilateral 11/19/2021   Procedure: CYSTOSCOPY WITH RETROGRADE PYELOGRAM, URETEROSCOPY AND STENT EXCHANGE, BASKET STONE REMOVAL;  Surgeon: Alexis Frock, MD;  Location: [facility];  Service: Urology;  Laterality: Bilateral;  75 MINS   HIP PINNING,CANNULATED Left 11/04/2021   Procedure: CANNULATED HIP PINNING;  Surgeon: Vanetta Mulders, MD;  Location: [facility];  Service: Orthopedics;  Laterality: Left;   HOLMIUM LASER APPLICATION Bilateral 6/73/4193  Procedure: HOLMIUM LASER APPLICATION;  Surgeon: Alexis Frock, MD;  Location: [facility];  Service: Urology;  Laterality: Bilateral;   INNER EAR SURGERY     Lost Hearing   TONSILLECTOMY AND ADENOIDECTOMY     VAGINAL HYSTERECTOMY     At age 70 due to "growth"   Social History:  reports that she quit smoking about 10 years ago. Her smoking use included cigarettes. She has a 20.00 pack-year smoking history. She has  never used smokeless tobacco. She reports that she does not drink alcohol and does not use drugs.  Allergies  Allergen Reactions   Amoxicillin Shortness Of Breath   Bee Venom Shortness Of Breath   Penicillins Anaphylaxis    Remote reaction Throat swelling - compromised airway Tolerates ceftriaxone   Pneumococcal Vaccines     Per patient due to Brain Aneurysm   Sulfa Antibiotics Hives, Itching and Swelling   Bactrim [Sulfamethoxazole-Trimethoprim]     Felt off balance    Family History  Problem Relation Age of Onset   Asthma Mother    Allergies Mother    Diabetes Mother    Lung cancer Father        smoked   Throat cancer Father        smoked   Pancreatic cancer Brother    Hypertension Maternal Grandfather    Diabetes Maternal Grandfather    Emphysema Maternal Aunt        never smoked, spouse did   Breast cancer Maternal Aunt    Breast cancer Maternal Aunt    Breast cancer Paternal Aunt     Prior to Admission medications   Medication Sig Start Date End Date Taking? Authorizing Provider  acetaminophen (TYLENOL) 500 MG tablet Take 1,000 mg by mouth every 6 (six) hours as needed for mild pain.   Yes [provider]  alendronate (FOSAMAX) 70 MG tablet Take 70 mg by mouth once a week. Monday 09/27/21  Yes [provider]  aspirin 325 MG tablet Take 1 tablet (325 mg total) by mouth daily for 28 days. 11/08/21 12/06/21 Yes Dahal, Marlowe Aschoff, MD  atorvastatin (LIPITOR) 40 MG tablet Take 1 tablet (40 mg total) by mouth daily. 01/31/18  Yes Lorella Nimrod, MD  DULoxetine (CYMBALTA) 60 MG capsule Take 60 mg by mouth daily.   Yes [provider]  gabapentin (NEURONTIN) 300 MG capsule Take 300 mg in AM, 300 mg at noon, 600 mg at bedtime Patient taking differently: Take 300-600 mg by mouth See admin instructions. Take 300 mfg in the morning, 300 mg at noon and 600 mg at bedtime 09/21/16  Yes Rivet, Carly J, MD  Ascorbic Acid (VITAMIN C PO) Take 500 mg by mouth daily.     [provider]  Blood Glucose Monitoring Suppl (ACCU-CHEK AVIVA PLUS) w/Device KIT The patient IS insulin requiring, ICD 10 code E11.9. The patient tests 4 times per day. 06/04/17   Lenore Cordia, MD  glucose blood (ACCU-CHEK AVIVA PLUS) test strip Use as instructed. The patient IS insulin requiring, ICD 10 code E11.9. The patient tests 4 times per day. 09/23/16   Rivet, Sindy Guadeloupe, MD  Insulin Glargine (LANTUS SOLOSTAR) 100 UNIT/ML Solostar Pen Inject 25 Units into the skin at bedtime. 01/31/18   Lorella Nimrod, MD  Insulin Pen Needle 32G X 4 MM MISC Use to inject lantus once dialy 10/29/17   Lorella Nimrod, MD  INVOKAMET XR 50-1000 MG TB24 TAKE 2 TABLETS BY MOUTH DAILY WITH BREAKFAST Patient taking differently: Take 2 tablets  by mouth daily with breakfast. 03/26/18   Lorella Nimrod, MD  Lancets (ACCU-CHEK SOFT TOUCH) lancets Use as instructed 09/23/16   Rivet, Sindy Guadeloupe, MD  nitrofurantoin, macrocrystal-monohydrate, (MACROBID) 100 MG capsule Take 1 capsule (100 mg total) by mouth 2 (two) times daily for 3 days. To prevent post-op infection. 11/19/21 11/22/21  Alexis Frock, MD  oxyCODONE-acetaminophen (PERCOCET) 5-325 MG tablet Take 1 tablet by mouth every 6 (six) hours as needed for severe pain or moderate pain (post-operatively). 11/19/21 11/19/22  Alexis Frock, MD  pantoprazole (PROTONIX) 40 MG tablet Take 1 tablet (40 mg total) by mouth daily. 11/07/21 12/07/21  Terrilee Croak, MD  propranolol ER (INDERAL LA) 120 MG 24 hr capsule Take 40 mg by mouth in the morning, at noon, and at bedtime. 09/17/15   [provider]  traZODone (DESYREL) 50 MG tablet Take 1 tablet (50 mg total) by mouth at bedtime as needed for sleep. 11/01/21   Allie Bossier, MD  vitamin B-12 (CYANOCOBALAMIN) 1000 MCG tablet Take 1,000 mcg by mouth daily.    [provider]    Physical Exam: Vitals:   11/21/21 1930 11/21/21 2000 11/21/21 2006 11/21/21 2045  BP: (!) 151/71 (!) 144/68    Pulse: 93 95  97  Resp: 15  (!) 22  20  Temp:   98.4 F (36.9 C)   TempSrc:   Oral   SpO2: 98% 97%  98%  Weight:      Height:       Constitutional: NAD, calm, chronically ill-appearing elderly female appearing older than stated age lying flat in bed in discomfort with vaginal burning pain. Eyes: lids and conjunctivae normal ENMT: Mucous membranes are moist.  Neck: normal, supple Respiratory: clear to auscultation bilaterally, no wheezing, no crackles. Normal respiratory effort. No accessory muscle use.  Cardiovascular: Regular rate and rhythm, no murmurs / rubs / gallops. No extremity edema.  Abdomen: Mild diffuse abdominal pain without any guarding, rigidity or rebound tenderness,, no masses palpated.  Bowel sounds positive.  Musculoskeletal: no clubbing / cyanosis. No joint deformity upper and lower extremities.  Mild pain and postoperative site of left hip. Skin: Postoperative healing wound of the left hip with clean Steri-Strips Neurologic: CN 2-12 grossly intact. Strength 5/5 in all 4.  Psychiatric: Normal judgment and insight. Alert and oriented x 3. Normal mood. Data Reviewed:  See HPI  Assessment and Plan: * Acute metabolic encephalopathy Secondary to sepsis from UTI and DKA -Treatment as below and follow clinically  Sepsis secondary to UTI ([place]) - Presented with fever, tachycardia and tachypnea with positive UA -She was given IV Zosyn and vancomycin in the ED.  Will switch to IV Rocephin since she does not have any past urine cultures of significant resistance. -Continuous IV fluids overnight  S/p left hip fracture S/p hip pinning on 6/13 with ortho -continue aspirin for DVT prophylaxis for 4 weeks  DKA (diabetic ketoacidosis) (HCC) - replete potassium as needed - continue insulin gtt with goal of 140-180 and AG <12. Her gap is improving but remains nauseous with active vomiting. Will continue insulin gtt until she can safely tolerate a diet before transitioning to basal insulin.  - IV NS until  BG <250, then switch to D5 LR  - BMP q4hr  - keep NPO   HLD (hyperlipidemia) Continue statin      Advance Care Planning:   Code Status: Full Code   Consults: none  Family Communication: Discussed with sister at bedside  Severity of Illness: The  appropriate patient status for this patient is INPATIENT. Inpatient status is judged to be reasonable and necessary in order to provide the required intensity of service to ensure the patient's safety. The patient's presenting symptoms, physical exam findings, and initial radiographic and laboratory data in the context of their chronic comorbidities is felt to place them at high risk for further clinical deterioration. Furthermore, it is not anticipated that the patient will be medically stable for discharge from the hospital within 2 midnights of admission.   * I certify that at the point of admission it is my clinical judgment that the patient will require inpatient hospital care spanning beyond 2 midnights from the point of admission due to high intensity of service, high risk for further deterioration and high frequency of surveillance required.*  Author: Orene Desanctis, DO 11/21/2021 9:13 PM  For on call review [email or web address].

## 2021-11-21 NOTE — Assessment & Plan Note (Signed)
-   Presented with fever, tachycardia and tachypnea with positive UA -She was given IV Zosyn and vancomycin in the ED.  Will switch to IV Rocephin since she does not have any past urine cultures of significant resistance. -Continuous IV fluids overnight

## 2021-11-21 NOTE — ED Triage Notes (Signed)
Pt arrived via [facility] from home. Pt's niece went to check on pt and found her on the floor and AMS. Unknown down time for pt. Pt is A&Ox2 to self and place.

## 2021-11-21 NOTE — Assessment & Plan Note (Signed)
Continue statin. 

## 2021-11-21 NOTE — Assessment & Plan Note (Addendum)
S/p hip pinning on 6/13 with ortho -continue aspirin for DVT prophylaxis for 4 weeks

## 2021-11-21 NOTE — ED Provider Notes (Signed)
[facility] to [facility] Provider Note   CSN: [id number] Arrival date & time: 11/21/21  1234     History  Chief Complaint  Patient presents with   Altered Mental Status    Laura Gentry is a 66 y.o. female.   Altered Mental Status  Patient is a 66 year old female with past medical history significant for DM 2 also history of recent hospitalization discharge 6/10 after UTI during which time she was severely septic was found to have retained stones that were obstructing which were treated with stents.  She presents emergency room today with altered mental status brought in by EMS febrile and tachycardic after she was found on the ground this morning seems that she may have been on the ground for approximately 30 minutes.  It seems that around 10:30 AM this morning her sister called her and then called her niece to go check on the patient because the patient sounded more confused than usual.  When the niece arrived around 03/1114 patient was on the ground and was only oriented to self.      Home Medications Prior to Admission medications   Medication Sig Start Date End Date Taking? Authorizing Provider  acetaminophen (TYLENOL) 500 MG tablet Take 1,000 mg by mouth every 6 (six) hours as needed for mild pain.    [provider]  alendronate (FOSAMAX) 70 MG tablet Take 70 mg by mouth once a week. Monday 09/27/21   [provider]  Ascorbic Acid (VITAMIN C PO) Take 500 mg by mouth daily.    [provider]  aspirin 325 MG tablet Take 1 tablet (325 mg total) by mouth daily for 28 days. 11/08/21 12/06/21  Terrilee Croak, MD  atorvastatin (LIPITOR) 40 MG tablet Take 1 tablet (40 mg total) by mouth daily. 01/31/18   Lorella Nimrod, MD  Blood Glucose Monitoring Suppl (ACCU-CHEK AVIVA PLUS) w/Device KIT The patient IS insulin requiring, ICD 10 code E11.9. The patient tests 4 times per day. 06/04/17   Lenore Cordia, MD  DULoxetine  (CYMBALTA) 60 MG capsule Take 60 mg by mouth daily.    [provider]  gabapentin (NEURONTIN) 300 MG capsule Take 300 mg in AM, 300 mg at noon, 600 mg at bedtime Patient taking differently: Take 900 mg by mouth at bedtime. 09/21/16   Rivet, Sindy Guadeloupe, MD  glucose blood (ACCU-CHEK AVIVA PLUS) test strip Use as instructed. The patient IS insulin requiring, ICD 10 code E11.9. The patient tests 4 times per day. 09/23/16   Rivet, Sindy Guadeloupe, MD  Insulin Glargine (LANTUS SOLOSTAR) 100 UNIT/ML Solostar Pen Inject 25 Units into the skin at bedtime. 01/31/18   Lorella Nimrod, MD  Insulin Pen Needle 32G X 4 MM MISC Use to inject lantus once dialy 10/29/17   Lorella Nimrod, MD  INVOKAMET XR 50-1000 MG TB24 TAKE 2 TABLETS BY MOUTH DAILY WITH BREAKFAST Patient taking differently: Take 2 tablets by mouth daily with breakfast. 03/26/18   Lorella Nimrod, MD  Lancets (ACCU-CHEK SOFT TOUCH) lancets Use as instructed 09/23/16   Rivet, Sindy Guadeloupe, MD  nitrofurantoin, macrocrystal-monohydrate, (MACROBID) 100 MG capsule Take 1 capsule (100 mg total) by mouth 2 (two) times daily for 3 days. To prevent post-op infection. 11/19/21 11/22/21  Alexis Frock, MD  oxyCODONE-acetaminophen (PERCOCET) 5-325 MG tablet Take 1 tablet by mouth every 6 (six) hours as needed for severe pain or moderate pain (post-operatively). 11/19/21 11/19/22  Alexis Frock, MD  pantoprazole (PROTONIX) 40 MG tablet Take  1 tablet (40 mg total) by mouth daily. 11/07/21 12/07/21  Terrilee Croak, MD  propranolol ER (INDERAL LA) 120 MG 24 hr capsule Take 40 mg by mouth in the morning, at noon, and at bedtime. 09/17/15   [provider]  traZODone (DESYREL) 50 MG tablet Take 1 tablet (50 mg total) by mouth at bedtime as needed for sleep. 11/01/21   Allie Bossier, MD  vitamin B-12 (CYANOCOBALAMIN) 1000 MCG tablet Take 1,000 mcg by mouth daily.    [provider]      Allergies    Bee venom, Penicillins, Pneumococcal vaccines, Sulfa antibiotics, and  Bactrim [sulfamethoxazole-trimethoprim]    Review of Systems   Review of Systems  Physical Exam Updated Vital Signs BP 124/68   Pulse 87   Temp (!) 101.8 F (38.8 C) (Rectal)   Resp (!) 24   Ht 5' 7"  (1.702 m)   Wt 71 kg   SpO2 99%   BMI 24.52 kg/m  Physical Exam Vitals and nursing note reviewed.  Constitutional:      General: She is not in acute distress.    Comments: Ill appearing   HENT:     Head: Normocephalic and atraumatic.     Nose: Nose normal.  Eyes:     General: No scleral icterus. Cardiovascular:     Rate and Rhythm: Regular rhythm. Tachycardia present.     Pulses: Normal pulses.     Heart sounds: Normal heart sounds.  Pulmonary:     Effort: Pulmonary effort is normal. No respiratory distress.     Breath sounds: No wheezing.  Abdominal:     Palpations: Abdomen is soft.     Tenderness: There is no abdominal tenderness.  Musculoskeletal:     Cervical back: Normal range of motion.     Right lower leg: No edema.     Left lower leg: No edema.  Skin:    General: Skin is warm and dry.     Capillary Refill: Capillary refill takes less than 2 seconds.  Neurological:     Mental Status: She is alert.     Comments: Oriented x1 self  Speech is fluent, clear without dysarthria or dysphasia.   Strength 5/5 in upper/lower extremities   Sensation intact in upper/lower extremities   CN I not tested  CN II grossly intact visual fields bilaterally. Did not visualize posterior eye.  CN III, IV, VI PERRLA and EOMs intact bilaterally  CN V Intact sensation to sharp and light touch to the face  CN VII facial movements symmetric  CN VIII not tested  CN IX, X no uvula deviation, symmetric rise of soft palate  CN XI 5/5 SCM and trapezius strength bilaterally  CN XII Midline tongue protrusion, symmetric L/R movements     Psychiatric:        Mood and Affect: Mood normal.        Behavior: Behavior normal.     ED Results / Procedures / Treatments   Labs (all labs  ordered are listed, but only abnormal results are displayed) Labs Reviewed  CBC WITH DIFFERENTIAL/PLATELET - Abnormal; Notable for the following components:      Result Value   Hemoglobin 11.6 (*)    MCH 24.9 (*)    All other components within normal limits  COMPREHENSIVE METABOLIC PANEL - Abnormal; Notable for the following components:   Sodium 132 (*)    Chloride 95 (*)    CO2 16 (*)    Glucose, Bld 303 (*)  Albumin 3.0 (*)    Anion gap 21 (*)    All other components within normal limits  URINALYSIS, ROUTINE W REFLEX MICROSCOPIC - Abnormal; Notable for the following components:   Color, Urine AMBER (*)    APPearance CLOUDY (*)    Glucose, UA >=500 (*)    Hgb urine dipstick LARGE (*)    Ketones, ur 80 (*)    Protein, ur 100 (*)    Leukocytes,Ua TRACE (*)    RBC / HPF >50 (*)    Bacteria, UA FEW (*)    All other components within normal limits  CK - Abnormal; Notable for the following components:   Total CK 20 (*)    All other components within normal limits  BASIC METABOLIC PANEL - Abnormal; Notable for the following components:   Sodium 134 (*)    Chloride 95 (*)    Glucose, Bld 262 (*)    All other components within normal limits  I-STAT VENOUS BLOOD GAS, ED - Abnormal; Notable for the following components:   pH, Ven 7.552 (*)    pCO2, Ven 26.6 (*)    pO2, Ven 50 (*)    Sodium 131 (*)    Calcium, Ion 1.09 (*)    HCT 35.0 (*)    Hemoglobin 11.9 (*)    All other components within normal limits  I-STAT CHEM 8, ED - Abnormal; Notable for the following components:   Sodium 131 (*)    Chloride 95 (*)    Glucose, Bld 315 (*)    Calcium, Ion 1.08 (*)    TCO2 21 (*)    Hemoglobin 11.9 (*)    HCT 35.0 (*)    All other components within normal limits  CBG MONITORING, ED - Abnormal; Notable for the following components:   Glucose-Capillary 302 (*)    All other components within normal limits  I-STAT ARTERIAL BLOOD GAS, ED - Abnormal; Notable for the following  components:   pH, Arterial 7.524 (*)    pCO2 arterial 30.5 (*)    Acid-Base Excess 3.0 (*)    Sodium 133 (*)    Potassium 3.3 (*)    HCT 29.0 (*)    Hemoglobin 9.9 (*)    All other components within normal limits  CBG MONITORING, ED - Abnormal; Notable for the following components:   Glucose-Capillary 231 (*)    All other components within normal limits  CBG MONITORING, ED - Abnormal; Notable for the following components:   Glucose-Capillary 218 (*)    All other components within normal limits  RESP PANEL BY RT-PCR (FLU A&B, COVID) ARPGX2  URINE CULTURE  CULTURE, BLOOD (ROUTINE X 2)  CULTURE, BLOOD (ROUTINE X 2)  LACTIC ACID, PLASMA  PROTIME-INR  APTT  OSMOLALITY  BASIC METABOLIC PANEL  BASIC METABOLIC PANEL  BASIC METABOLIC PANEL    EKG None  Radiology CT ABDOMEN PELVIS W CONTRAST  Result Date: 11/21/2021 CLINICAL DATA:  Abdominal pain, acute, nonlocalized. Patient was found on the floor. Altered mental status. EXAM: CT ABDOMEN AND PELVIS WITH CONTRAST TECHNIQUE: Multidetector CT imaging of the abdomen and pelvis was performed using the standard protocol following bolus administration of intravenous contrast. RADIATION DOSE REDUCTION: This exam was performed according to the departmental dose-optimization program which includes automated exposure control, adjustment of the mA and/or kV according to patient size and/or use of iterative reconstruction technique. CONTRAST:  96m OMNIPAQUE IOHEXOL 300 MG/ML  SOLN COMPARISON:  CT of the abdomen pelvis 10/28/2021 FINDINGS: Lower chest: Mild dependent  atelectasis is present the left base. The heart size is normal. No significant pleural or pericardial effusion is present. Hepatobiliary: Mild diffuse fatty infiltration liver is present. Patient is status post cholecystectomy. The common bile duct is within normal limits. Pancreas: Unremarkable. No pancreatic ductal dilatation or surrounding inflammatory changes. Spleen: Normal in size  without focal abnormality. Adrenals/Urinary Tract: Adrenal glands are normal bilaterally. Bilateral nonobstructing calcifications are similar the prior study. The previously noted 5 mm stone in the midportion of the right kidney is no longer present. A nonobstructing 5 mm stone at the lower pole left kidney is present. There is some enhancement of the ureteral wall bilaterally. Bilateral renal simple cysts are present. Recommend a follow-up. Small amount of gas is present within the urinary bladder. Stomach/Bowel: The stomach and duodenum are within normal limits. Small bowel is unremarkable. Terminal ileum is within normal limits. Appendectomy noted. The ascending and transverse colon are within normal limits. Descending and sigmoid colon are unremarkable. Vascular/Lymphatic: Atherosclerotic calcifications are present the aorta branch vessels. No aneurysm is present. Reproductive: Status post hysterectomy. No adnexal masses. Other: No abdominal wall hernia or abnormality. No abdominopelvic ascites. Musculoskeletal: Disc disease again noted at L4-5 with central and foraminal stenosis. No focal osseous lesions are present. The bony pelvis is within normal limits. The hips are located and scratched at the hips are located bilaterally. Left proximal femur ORIF is again noted. IMPRESSION: 1. Bilateral nonobstructing nephrolithiasis. 2. Enhancement of the ureteral wall bilaterally raises concern for urinary tract infection. This may represent some residual inflammatory change from recent stents. 3. Small amount of gas within the urinary bladder. This may be related to recent instrumentation. This could be related to infection. 4. Hepatic steatosis. 5. Cholecystectomy, hysterectomy, and appendectomy. 6. Degenerative changes in the lower lumbar spine are stable. 7. Aortic Atherosclerosis (ICD10-I70.0). Electronically Signed   By: San Morelle M.D.   On: 11/21/2021 17:15   DG Chest 2 View  Result Date:  11/21/2021 CLINICAL DATA:  Altered mental status, diabetes EXAM: CHEST - 2 VIEW COMPARISON:  11/02/2021 FINDINGS: Normal heart size, mediastinal contours, and pulmonary vascularity. Atherosclerotic calcification aorta. Lungs clear. No pulmonary infiltrate, pleural effusion, or pneumothorax. Osseous demineralization. IMPRESSION: No acute abnormalities. Aortic Atherosclerosis (ICD10-I70.0). Electronically Signed   By: Lavonia Dana M.D.   On: 11/21/2021 14:32   CT Cervical Spine Wo Contrast  Result Date: 11/21/2021 CLINICAL DATA:  Altered mental status.  Fall with pain. EXAM: CT HEAD WITHOUT CONTRAST CT CERVICAL SPINE WITHOUT CONTRAST TECHNIQUE: Multidetector CT imaging of the head and cervical spine was performed following the standard protocol without intravenous contrast. Multiplanar CT image reconstructions of the cervical spine were also generated. RADIATION DOSE REDUCTION: This exam was performed according to the departmental dose-optimization program which includes automated exposure control, adjustment of the mA and/or kV according to patient size and/or use of iterative reconstruction technique. COMPARISON:  11/02/2021. FINDINGS: CT HEAD FINDINGS Brain: No evidence of acute infarction, hemorrhage, hydrocephalus, extra-axial collection or mass lesion/mass effect. Stable aneurysm clip above the anterior right temporal lobe. Vascular: No hyperdense vessel or unexpected calcification. Skull: Previous right frontotemporal craniotomy. No fracture. No bone lesion. Sinuses/Orbits: Globes and orbits are unremarkable. Sinuses are clear. Other: None. CT CERVICAL SPINE FINDINGS Alignment: Normal. Skull base and vertebrae: No acute fracture. No primary bone lesion or focal pathologic process. Soft tissues and spinal canal: No prevertebral fluid or swelling. No visible canal hematoma. Disc levels: Disc spaces are well maintained. No evidence of a disc herniation or of  significant disc bulging. No stenosis. Upper chest:  Negative. Other: None. IMPRESSION: HEAD CT 1. No acute intracranial abnormalities. CERVICAL CT 1. Normal. Electronically Signed   By: Lajean Manes M.D.   On: 11/21/2021 14:12   CT HEAD WO CONTRAST (5MM)  Result Date: 11/21/2021 CLINICAL DATA:  Altered mental status.  Fall with pain. EXAM: CT HEAD WITHOUT CONTRAST CT CERVICAL SPINE WITHOUT CONTRAST TECHNIQUE: Multidetector CT imaging of the head and cervical spine was performed following the standard protocol without intravenous contrast. Multiplanar CT image reconstructions of the cervical spine were also generated. RADIATION DOSE REDUCTION: This exam was performed according to the departmental dose-optimization program which includes automated exposure control, adjustment of the mA and/or kV according to patient size and/or use of iterative reconstruction technique. COMPARISON:  11/02/2021. FINDINGS: CT HEAD FINDINGS Brain: No evidence of acute infarction, hemorrhage, hydrocephalus, extra-axial collection or mass lesion/mass effect. Stable aneurysm clip above the anterior right temporal lobe. Vascular: No hyperdense vessel or unexpected calcification. Skull: Previous right frontotemporal craniotomy. No fracture. No bone lesion. Sinuses/Orbits: Globes and orbits are unremarkable. Sinuses are clear. Other: None. CT CERVICAL SPINE FINDINGS Alignment: Normal. Skull base and vertebrae: No acute fracture. No primary bone lesion or focal pathologic process. Soft tissues and spinal canal: No prevertebral fluid or swelling. No visible canal hematoma. Disc levels: Disc spaces are well maintained. No evidence of a disc herniation or of significant disc bulging. No stenosis. Upper chest: Negative. Other: None. IMPRESSION: HEAD CT 1. No acute intracranial abnormalities. CERVICAL CT 1. Normal. Electronically Signed   By: Lajean Manes M.D.   On: 11/21/2021 14:12    Procedures .Critical Care  Performed by: Tedd Sias, PA Authorized by: Tedd Sias, PA    Critical care provider statement:    Critical care time (minutes):  35   Critical care time was exclusive of:  Separately billable procedures and treating other patients and teaching time   Critical care was necessary to treat or prevent imminent or life-threatening deterioration of the following conditions: Sepsis.   Critical care was time spent personally by me on the following activities:  Development of treatment plan with patient or surrogate, review of old charts, re-evaluation of patient's condition, pulse oximetry, ordering and review of radiographic studies, ordering and review of laboratory studies, ordering and performing treatments and interventions, obtaining history from patient or surrogate, examination of patient and evaluation of patient's response to treatment   Care discussed with: admitting provider       Medications Ordered in ED Medications  insulin regular, human (MYXREDLIN) 100 units/ 100 mL infusion (5 Units/hr Intravenous New Bag/Given 11/21/21 1630)  lactated ringers infusion (0 mLs Intravenous Stopped 11/21/21 1832)  dextrose 5 % in lactated ringers infusion ( Intravenous New Bag/Given 11/21/21 1836)  dextrose 50 % solution 0-50 mL (has no administration in time range)  potassium chloride 10 mEq in 100 mL IVPB (10 mEq Intravenous New Bag/Given 11/21/21 1757)  vancomycin (VANCOREADY) IVPB 1500 mg/300 mL (has no administration in time range)  lactated ringers bolus 1,000 mL (0 mLs Intravenous Stopped 11/21/21 1831)  ondansetron (ZOFRAN) injection 4 mg (4 mg Intravenous Given 11/21/21 1330)  acetaminophen (TYLENOL) suppository 650 mg (650 mg Rectal Given 11/21/21 1331)  piperacillin-tazobactam (ZOSYN) IVPB 3.375 g (0 g Intravenous Stopped 11/21/21 1617)  iohexol (OMNIPAQUE) 300 MG/ML solution 85 mL (85 mLs Intravenous Contrast Given 11/21/21 1704)    ED Course/ Medical Decision Making/ A&P Clinical Course as of 11/21/21 1845  Fri Nov 21, 2021  1448 CT HEAD WO CONTRAST  (5MM) [KZ]  1555 Protein(!): 100 [KZ]  1814 IMPRESSION: 1. Bilateral nonobstructing nephrolithiasis. 2. Enhancement of the ureteral wall bilaterally raises concern for urinary tract infection. This may represent some residual inflammatory change from recent stents. 3. Small amount of gas within the urinary bladder. This may be related to recent instrumentation. This could be related to infection. 4. Hepatic steatosis. 5. Cholecystectomy, hysterectomy, and appendectomy. 6. Degenerative changes in the lower lumbar spine are stable. 7. Aortic Atherosclerosis (ICD10-I70.0).   Electronically Signed   By: San Morelle M.D.   On: 11/21/2021 17:15   [WF]    Clinical Course User Index [KZ] Rinaldo Cloud, Student-PA [WF] Tedd Sias, [place]                           Medical Decision Making Amount and/or Complexity of Data Reviewed Labs: ordered. Radiology: ordered.  Risk OTC drugs. Prescription drug management. Decision regarding hospitalization.    This patient presents to the ED for concern of altered mental status, fever, this involves a number of treatment options, and is a complaint that carries with it a high risk of complications and morbidity.  The differential diagnosis includes sepsis, metabolic encephalopathy   Co morbidities: Discussed in HPI   Brief History:  Patient is a 66 year old female with past medical history significant for DM 2 also history of recent hospitalization discharge 6/10 after UTI during which time she was severely septic was found to have retained stones that were obstructing which were treated with stents.  She presents emergency room today with altered mental status brought in by EMS febrile and tachycardic after she was found on the ground this morning seems that she may have been on the ground for approximately 30 minutes.  It seems that around 10:30 AM this morning her sister called her and then called her niece to go check on the  patient because the patient sounded more confused than usual.  When the niece arrived around 03/1114 patient was on the ground and was only oriented to self.    EMR reviewed including pt PMHx, past surgical history and past visits to ER.   See HPI for more details   Lab Tests:   I ordered and independently interpreted labs. Labs notable for significant anion gap on original CMP with bicarb of 16 and anion gap of 21 hyperglycemic at 303.  Will initiate fluids, insulin and empiric antibiotics.  CBC without leukocytosis or anemia of any significance.  Coags within normal limits repeat chemistry with anion gap that is now normal blood sugars improved to 262.   Imaging Studies:  Abnormal findings. I personally reviewed all imaging studies. Imaging notable for Ureteral wall enhancement bilaterally concern for UTI versus residual changes from stents.   Cardiac Monitoring:  The patient was maintained on a cardiac monitor.  I personally viewed and interpreted the cardiac monitored which showed an underlying rhythm of: Sinus tachycardia EKG non-ischemic   Medicines ordered:  I ordered medication including lactated Ringer's, Zosyn, vancomycin, Tylenol, nausea medicine including Zofran for her symptoms Reevaluation of the patient after these medicines showed that the patient improved I have reviewed the patients home medicines and have made adjustments as needed   Critical Interventions:     Consults/Attending Physician   I discussed this case with my attending physician who cosigned this note including patient's presenting symptoms, physical exam, and planned diagnostics and interventions. Attending physician stated agreement with  plan or made changes to plan which were implemented.   Attending physician assessed patient at bedside.   Discussed with Dr. Flossie Buffy hospitalist service who will admit.   Reevaluation:  After the interventions noted above I re-evaluated patient and found  that they have :improved   Social Determinants of Health:      Problem List / ED Course:  Sepsis likely due to UTI broad-spectrum antibiotics and fluids given.  Perhaps some degree of metabolic encephalopathy given anion gap initial presentation.  This is resolved.   Dispostion:  After consideration of the diagnostic results and the patients response to treatment, I feel that the patent would benefit from admission to the hospital  Final Clinical Impression(s) / ED Diagnoses Final diagnoses:  None    Rx / DC Orders ED Discharge Orders     None         Tedd Sias, [place] 11/21/21 1952    Ezequiel Essex, MD 11/22/21 1417

## 2021-11-21 NOTE — Assessment & Plan Note (Deleted)
-   Hemoglobin of 9.9 which is down trended from

## 2021-11-21 NOTE — Assessment & Plan Note (Addendum)
-   replete potassium as needed - continue insulin gtt with goal of 140-180 and AG <12. Her gap is improving but remains nauseous with active vomiting. Will continue insulin gtt until she can safely tolerate a diet before transitioning to basal insulin.  - IV NS until BG <250, then switch to D5 LR  - BMP q4hr  - keep NPO

## 2021-11-21 NOTE — Assessment & Plan Note (Signed)
Secondary to sepsis from UTI and DKA -Treatment as below and follow clinically

## 2021-11-22 DIAGNOSIS — G9341 Metabolic encephalopathy: Secondary | ICD-10-CM | POA: Diagnosis not present

## 2021-11-22 DIAGNOSIS — R4182 Altered mental status, unspecified: Secondary | ICD-10-CM

## 2021-11-22 DIAGNOSIS — E1110 Type 2 diabetes mellitus with ketoacidosis without coma: Secondary | ICD-10-CM | POA: Diagnosis not present

## 2021-11-22 DIAGNOSIS — N3001 Acute cystitis with hematuria: Secondary | ICD-10-CM

## 2021-11-22 LAB — BASIC METABOLIC PANEL
Anion gap: 14 (ref 5–15)
Anion gap: 10 (ref 5–15)
BUN: 11 mg/dL (ref 8–23)
BUN: 10 mg/dL (ref 8–23)
CO2: 23 mmol/L (ref 22–32)
CO2: 25 mmol/L (ref 22–32)
Calcium: 8.8 mg/dL — ABNORMAL LOW (ref 8.9–10.3)
Calcium: 8.7 mg/dL — ABNORMAL LOW (ref 8.9–10.3)
Chloride: 99 mmol/L (ref 98–111)
Chloride: 102 mmol/L (ref 98–111)
Creatinine, Ser: 0.73 mg/dL (ref 0.44–1.00)
Creatinine, Ser: 0.75 mg/dL (ref 0.44–1.00)
GFR, Estimated: >60 mL/min (ref >60)
GFR, Estimated: >60 mL/min (ref >60)
Glucose, Bld: 198 mg/dL — ABNORMAL HIGH (ref 70–99)
Glucose, Bld: 168 mg/dL — ABNORMAL HIGH (ref 70–99)
Potassium: 2.9 mmol/L — ABNORMAL LOW (ref 3.5–5.1)
Potassium: 3.3 mmol/L — ABNORMAL LOW (ref 3.5–5.1)
Sodium: 136 mmol/L (ref 135–145)
Sodium: 137 mmol/L (ref 135–145)

## 2021-11-22 LAB — GLUCOSE, CAPILLARY
Glucose-Capillary: 112 mg/dL — ABNORMAL HIGH (ref 70–99)
Glucose-Capillary: 140 mg/dL — ABNORMAL HIGH (ref 70–99)
Glucose-Capillary: 153 mg/dL — ABNORMAL HIGH (ref 70–99)
Glucose-Capillary: 164 mg/dL — ABNORMAL HIGH (ref 70–99)
Glucose-Capillary: 170 mg/dL — ABNORMAL HIGH (ref 70–99)
Glucose-Capillary: 178 mg/dL — ABNORMAL HIGH (ref 70–99)
Glucose-Capillary: 179 mg/dL — ABNORMAL HIGH (ref 70–99)
Glucose-Capillary: 185 mg/dL — ABNORMAL HIGH (ref 70–99)
Glucose-Capillary: 186 mg/dL — ABNORMAL HIGH (ref 70–99)
Glucose-Capillary: 193 mg/dL — ABNORMAL HIGH (ref 70–99)
Glucose-Capillary: 215 mg/dL — ABNORMAL HIGH (ref 70–99)

## 2021-11-22 LAB — BETA-HYDROXYBUTYRIC ACID: Beta-Hydroxybutyric Acid: <0.05 mmol/L — ABNORMAL LOW (ref 0.05–0.27)

## 2021-11-22 LAB — HIV ANTIBODY (ROUTINE TESTING W REFLEX): HIV Screen 4th Generation wRfx: NONREACTIVE

## 2021-11-22 MED ORDER — INSULIN GLARGINE-YFGN 100 UNIT/ML ~~LOC~~ SOLN
25.0000 [IU] | Freq: Every day | SUBCUTANEOUS | Status: DC
  Administered 2021-11-22 – 2021-11-24 (×3): 25 [IU] via SUBCUTANEOUS
  Filled 2021-11-22 (×4): qty 0.25

## 2021-11-22 MED ORDER — INSULIN ASPART 100 UNIT/ML IJ SOLN
0.0000 [IU] | Freq: Three times a day (TID) | INTRAMUSCULAR | Status: DC
  Administered 2021-11-22: 2 [IU] via SUBCUTANEOUS
  Administered 2021-11-22: 3 [IU] via SUBCUTANEOUS
  Administered 2021-11-23 – 2021-11-25 (×8): 2 [IU] via SUBCUTANEOUS

## 2021-11-22 MED ORDER — MAGNESIUM SULFATE 4 GM/100ML IV SOLN
4.0000 g | Freq: Once | INTRAVENOUS | Status: AC
  Administered 2021-11-22: 4 g via INTRAVENOUS
  Filled 2021-11-22: qty 100

## 2021-11-22 MED ORDER — SODIUM CHLORIDE 0.9 % IV SOLN
2.0000 g | INTRAVENOUS | Status: DC
  Administered 2021-11-23 – 2021-11-24 (×3): 2 g via INTRAVENOUS
  Filled 2021-11-22 (×3): qty 20

## 2021-11-22 MED ORDER — HYDRALAZINE HCL 20 MG/ML IJ SOLN: 10.0000 mg | Freq: Four times a day (QID) | INTRAMUSCULAR | Status: DC | PRN

## 2021-11-22 MED ORDER — POTASSIUM CHLORIDE CRYS ER 20 MEQ PO TBCR
40.0000 meq | EXTENDED_RELEASE_TABLET | Freq: Once | ORAL | Status: AC
  Administered 2021-11-22: 40 meq via ORAL
  Filled 2021-11-22: qty 2

## 2021-11-22 NOTE — Evaluation (Signed)
Occupational Therapy Evaluation Patient Details Name: Laura Gentry MRN: [id number] DOB: May 17, 1956 Today's Date: 11/22/2021   History of Present Illness 66 y/o female presented to ED on 11/21/21 after being found down. Recent hospitalization for L hip fx 6/11-6/16 and sepsis 2/2 UTI 6/6-6/10. Admitted acute metabolic encephalopathy. PMH: cerebral aneurysm s/p clipping, diabetes, thyroid disease   Clinical Impression   Pt independent at baseline with ADLs and has been using RW after hip fx surgery for mobility, pt lives with parent and sister. Pt currently supervision-min guard for ADLs, mod I for bed mobility, and min guard for transfers with RW. Pt A & O x4, however is unaware of bowel incontinence when sitting up in chair upon arrival, pt needing increased cues to walk to bathroom/change clothing. Pt with decreased safety awareness, attempting to abandon RW x2 during session. Pt presenting with impairments listed below, will follow acutely. Recommend HHOT at d/c.     Recommendations for follow up therapy are one component of a multi-disciplinary discharge planning process, led by the attending physician.  Recommendations may be updated based on patient status, additional functional criteria and insurance authorization.   Follow Up Recommendations  Home health OT    Assistance Recommended at Discharge Intermittent Supervision/Assistance  Patient can return home with the following A little help with walking and/or transfers;A little help with bathing/dressing/bathroom;Assistance with cooking/housework;Assist for transportation;Help with stairs or ramp for entrance    Functional Status Assessment  Patient has had a recent decline in their functional status and demonstrates the ability to make significant improvements in function in a reasonable and predictable amount of time.  Equipment Recommendations  None recommended by OT;Other (comment) (pt has all needed DME)    Recommendations for  Other Services PT consult     Precautions / Restrictions Precautions Precautions: Fall Restrictions Weight Bearing Restrictions: No LUE Weight Bearing: Weight bearing as tolerated LLE Weight Bearing: Weight bearing as tolerated      Mobility Bed Mobility Overal bed mobility: Modified Independent             General bed mobility comments: to return to bed    Transfers Overall transfer level: Needs assistance Equipment used: Rolling walker (2 wheels) Transfers: Sit to/from Stand Sit to Stand: Min guard           General transfer comment: cues to keep RW close to body, pt attempting to abandon RW      Balance Overall balance assessment: Needs assistance Sitting-balance support: Feet supported Sitting balance-Leahy Scale: Good     Standing balance support: Bilateral upper extremity supported, During functional activity, Reliant on assistive device for balance Standing balance-Leahy Scale: Poor Standing balance comment: reliant on RW                           ADL either performed or assessed with clinical judgement   ADL Overall ADL's : Needs assistance/impaired Eating/Feeding: Set up   Grooming: Wash/dry hands;Min guard;Standing   Upper Body Bathing: Supervision/ safety;Standing;Sitting   Lower Body Bathing: Supervison/ safety;Sitting/lateral leans;Sit to/from stand   Upper Body Dressing : Supervision/safety   Lower Body Dressing: Supervision/safety   Toilet Transfer: Min guard;Ambulation;Regular Toilet;Rolling walker (2 wheels)   Toileting- Clothing Manipulation and Hygiene: Min guard;Sitting/lateral lean;Sit to/from stand       Functional mobility during ADLs: Min guard;Rolling walker (2 wheels)       Vision   Vision Assessment?: No apparent visual deficits     Perception  Praxis      Pertinent Vitals/Pain Pain Assessment Pain Assessment: No/denies pain     Hand Dominance     Extremity/Trunk Assessment Upper  Extremity Assessment Upper Extremity Assessment: Generalized weakness (pt with recent L elbow fx)   Lower Extremity Assessment Lower Extremity Assessment: Defer to PT evaluation   Cervical / Trunk Assessment Cervical / Trunk Assessment: Normal   Communication Communication Communication: No difficulties   Cognition Arousal/Alertness: Awake/alert Behavior During Therapy: WFL for tasks assessed/performed Overall Cognitive Status: No family/caregiver present to determine baseline cognitive functioning                                 General Comments: pt unaware of incontinent episode, A & Ox4, needing increased cuing to doff soiled undergarments     General Comments       Exercises     Shoulder Instructions      Home Living Family/patient expects to be discharged to:: Private residence Living Arrangements: Parent;Other relatives Available Help at Discharge: Family;Available 24 hours/day Type of Home: House Home Access: Stairs to enter [employer] of Steps: 7   Home Layout: Multi-level;1/2 bath on main level Alternate Level Stairs-Number of Steps: flight   Bathroom Shower/Tub: Teacher, early years/pre: Standard     Home Equipment: Conservation officer, nature (2 wheels);Hand held shower head;Grab bars - tub/shower;Tub bench   Additional Comments: lives with her sister and her mother who is 51      Prior Functioning/Environment Prior Level of Function : Independent/Modified Independent             Mobility Comments: uses RW currently following hip fx ADLs Comments: independent        OT Problem List: Impaired balance (sitting and/or standing);Decreased knowledge of use of DME or AE;Decreased strength;Decreased range of motion;Decreased activity tolerance;Decreased cognition;Decreased safety awareness      OT Treatment/Interventions: Self-care/ADL training;DME and/or AE instruction;Patient/family education;Balance training;Therapeutic  activities    OT Goals(Current goals can be found in the care plan section) Acute Rehab OT Goals Patient Stated Goal: to get better OT Goal Formulation: With patient Time For Goal Achievement: 12/06/21 Potential to Achieve Goals: Good  OT Frequency: Min 2X/week    Co-evaluation              AM-PAC OT "6 Clicks" Daily Activity     Outcome Measure Help from another person eating meals?: None Help from another person taking care of personal grooming?: A Little Help from another person toileting, which includes using toliet, bedpan, or urinal?: A Little Help from another person bathing (including washing, rinsing, drying)?: A Little Help from another person to put on and taking off regular upper body clothing?: A Little Help from another person to put on and taking off regular lower body clothing?: A Little 6 Click Score: 19   End of Session Equipment Utilized During Treatment: Gait belt;Rolling walker (2 wheels) Nurse Communication: Mobility status;Other (comment) (pt needs new purewick)  Activity Tolerance: Patient tolerated treatment well Patient left: in bed;with call bell/phone within reach;with bed alarm set  OT Visit Diagnosis: Unsteadiness on feet (R26.81);Other abnormalities of gait and mobility (R26.89);Muscle weakness (generalized) (M62.81);Other symptoms and signs involving cognitive function                Time: 1440-1504 OT Time Calculation (min): 24 min Charges:  OT General Charges $OT Visit: 1 Visit OT Evaluation $OT Eval Moderate Complexity: 1 Mod OT  Treatments $Self Care/Home Management : 8-22 mins  Lynnda Child, OTD, OTR/L Acute Rehab [phone] - [place] 11/22/2021, 4:17 PM

## 2021-11-22 NOTE — Progress Notes (Addendum)
PROGRESS NOTE        PATIENT DETAILS Name: Laura Gentry Age: 66 y.o. Sex: female Date of Birth: [birth year] Admit Date: 11/21/2021 Admitting Physician Orene Desanctis, DO SPQ:ZRAQTM, Jori Moll, MD  Brief Summary: Patient is a 66 y.o.  female with history of DM2, HLD, HTN-who recently was hospitalized for sepsis due to obstructive renal stone-requiring renal stone placement-and then for a left hip fracture following a mechanical fall requiring surgical correction-presented to the hospital with fever and confusion.  See below for further details.   Significant events: 6/6-6/10>> hospitalization for sepsis due to complicated UTI in the setting of obstructive right ureteral stone-s/p cystoscopy with bilateral stent placement. 6/11-6/16>> hospitalization for left hip fracture-requiring surgical treatment. 6/28>> cystoscopy/lithotripsy/exchange of bilateral renal stents. 6/30>> admit to [facility] for fever/confusion-found to be in DKA-suspected sepsis due to recent urological instrumentation (was on oral antibiotics at home)  Significant studies: 6/30>> CT head: No acute intracranial abnormality 6/30>> CT C-spine: No fracture/subluxation 6/30>> CXR: No PNA 6/30>> CT abdomen/pelvis:: Bilateral nonobstructing nephrolithiasis, enhancement of ureteral wall, small amount of gas within the urinary bladder  Significant microbiology data: 6/30>> COVID/influenza PCR: Negative 6/30>> blood culture: No growth  Procedures: 6/7>> cystoscopy with bilateral stent placement by Dr. Tresa Moore 6/13>> left hip cannulated screw placement by Dr Sammuel Hines 6/28>> cystoscopy-ureteroscopy-laser lithotripsy-exchange of bilateral stents.  Consults: None  Subjective: Completely awake and alert-feels much better.  Complains of slight pain in her bilateral flank area  Objective: Vitals: Blood pressure (!) 169/68, pulse 91, temperature 100 F (37.8 C), temperature source Oral, resp. rate 18, height 5'  7" (1.702 m), weight 71 kg, SpO2 95 %.   Exam: Gen Exam:Alert awake-not in any distress HEENT:atraumatic, normocephalic Chest: B/L clear to auscultation anteriorly CVS:S1S2 regular Abdomen:soft non tender, non distended Extremities:no edema Neurology: Non focal Skin: no rash  Pertinent Labs/Radiology:    Latest Ref Rng & Units 11/21/2021    3:38 PM 11/21/2021    1:46 PM 11/21/2021    1:45 PM  CBC  Hemoglobin 12.0 - 15.0 g/dL 9.9  11.9  11.9   Hematocrit 36.0 - 46.0 % 29.0  35.0  35.0     Lab Results  Component Value Date   NA 137 11/22/2021   K 3.3 (L) 11/22/2021   CL 102 11/22/2021   CO2 25 11/22/2021      Assessment/Plan: DKA: Resolved-likely in the setting of acute infection-Invokana use.  Transitioning to SQ insulin.  Acute metabolic encephalopathy: Due to DKA/sepsis physiology-CT imaging negative-encephalopathy has resolved as she is completely awake and alert this morning  Sepsis (POA) due to complicated UTI/pyelonephritis: Suspect related to recent urological instrumentation-was on oral antimicrobial therapy at home-hence cultures may be sterile.  Clinically improved-no further fever overnight-encephalopathy has resolved-continue IV antibiotics and follow culture data.  Spoke with urologist on-call-Dr. McKenzie-recommends that we treat her as if she has pyelonephritis for total of 7 to 10 days.  Recent left hip fracture: S/p left hip cannulated screw placement on 6/13-continue supportive care.  Reviewed discharge summary-recommendations were for ASA x4 weeks for VTE prophylaxis.  Continue prophylactic Lovenox while hospitalized-can resume ASA on discharge.  Mobilize with PT.  DM-2 (A1c 8.8): CBG stable-transitioning to SQ insulin/SSI-follow and adjust.  Given she came in DKA-will need to discontinue Invokana on discharge.  Recent Labs    11/22/21 0655 11/22/21 0816 11/22/21 0942  GLUCAP 112*  179* 178*    HLD: Continue statin  HTN: BP stable-continue  propanolol  Hypokalemia/hypomagnesemia: Replete and recheck.  BMI: Estimated body mass index is 24.52 kg/m as calculated from the following:   Height as of this encounter: '5\' 7"'$  (1.702 m).   Weight as of this encounter: 71 kg.   Code status:   Code Status: Full Code   DVT Prophylaxis: enoxaparin (LOVENOX) injection 40 mg Start: 11/21/21 2100   Family Communication: Sister at bedside   Disposition Plan: Status is: Inpatient Remains inpatient appropriate because: Resolving sepsis physiology-getting off insulin infusion-not yet stable for discharge.   Planned Discharge Destination:Home   Diet: Diet Order             Diet Carb Modified Fluid consistency: Thin; Room service appropriate? Yes  Diet effective now                     Antimicrobial agents: Anti-infectives (From admission, onward)    Start     Dose/Rate Route Frequency Ordered Stop   11/23/21 0000  cefTRIAXone (ROCEPHIN) 2 g in sodium chloride 0.9 % 100 mL IVPB        2 g 200 mL/hr over 30 Minutes Intravenous Every 24 hours 11/22/21 1024     11/22/21 0000  cefTRIAXone (ROCEPHIN) 1 g in sodium chloride 0.9 % 100 mL IVPB  Status:  Discontinued        1 g 200 mL/hr over 30 Minutes Intravenous Every 24 hours 11/21/21 2058 11/22/21 1024   11/21/21 1600  vancomycin (VANCOREADY) IVPB 1500 mg/300 mL        1,500 mg 150 mL/hr over 120 Minutes Intravenous  Once 11/21/21 1554 11/21/21 2130   11/21/21 1500  piperacillin-tazobactam (ZOSYN) IVPB 3.375 g        3.375 g 100 mL/hr over 30 Minutes Intravenous  Once 11/21/21 1454 11/21/21 1617        MEDICATIONS: Scheduled Meds:  atorvastatin  40 mg Oral Daily   DULoxetine  60 mg Oral Daily   enoxaparin (LOVENOX) injection  40 mg Subcutaneous Q24H   gabapentin  300 mg Oral BID   gabapentin  600 mg Oral QHS   insulin aspart  0-9 Units Subcutaneous TID WC   insulin glargine-yfgn  25 Units Subcutaneous Daily   pantoprazole  40 mg Oral Daily   propranolol ER  120  mg Oral QHS   vitamin B-12  1,000 mcg Oral Daily   Continuous Infusions:  [START ON 11/23/2021] cefTRIAXone (ROCEPHIN)  IV     dextrose 5% lactated ringers 125 mL/hr at 11/22/21 0317   insulin 3.4 Units/hr (11/22/21 0818)   lactated ringers Stopped (11/21/21 1832)   PRN Meds:.acetaminophen, dextrose, morphine injection, ondansetron (ZOFRAN) IV, traZODone   I have personally reviewed following labs and imaging studies  LABORATORY DATA: CBC: Recent Labs  Lab 11/19/21 1325 11/21/21 1310 11/21/21 1345 11/21/21 1346 11/21/21 1538  WBC 11.3* 8.7  --   --   --   NEUTROABS  --  7.4  --   --   --   HGB 11.3* 11.6* 11.9* 11.9* 9.9*  HCT 36.0 38.3 35.0* 35.0* 29.0*  MCV 78.1* 82.4  --   --   --   PLT 467* 300  --   --   --     Basic Metabolic Panel: Recent Labs  Lab 11/21/21 1620 11/21/21 1924 11/21/21 2125 11/22/21 0143 11/22/21 0416  NA 134* 134* 133* 136 137  K 3.8 3.3* 3.0* 2.9*  3.3*  CL 95* 99 100 99 102  CO2 '24 25 24 23 25  '$ GLUCOSE 262* 204* 155* 198* 168*  BUN '14 13 12 11 10  '$ CREATININE 0.89 0.88 0.74 0.73 0.75  CALCIUM 8.9 8.6* 8.4* 8.8* 8.7*  MG  --   --  1.4*  --   --     GFR: Estimated Creatinine Clearance: 67.3 mL/min (by C-G formula based on SCr of 0.75 mg/dL).  Liver Function Tests: Recent Labs  Lab 11/21/21 1310  AST 17  ALT 14  ALKPHOS 104  BILITOT 1.1  PROT 6.8  ALBUMIN 3.0*   No results for input(s): "LIPASE", "AMYLASE" in the last 168 hours. No results for input(s): "AMMONIA" in the last 168 hours.  Coagulation Profile: Recent Labs  Lab 11/21/21 1325  INR 1.1    Cardiac Enzymes: Recent Labs  Lab 11/21/21 1310  CKTOTAL 20*    BNP (last 3 results) No results for input(s): "PROBNP" in the last 8760 hours.  Lipid Profile: No results for input(s): "CHOL", "HDL", "LDLCALC", "TRIG", "CHOLHDL", "LDLDIRECT" in the last 72 hours.  Thyroid Function Tests: No results for input(s): "TSH", "T4TOTAL", "FREET4", "T3FREE", "THYROIDAB" in  the last 72 hours.  Anemia Panel: No results for input(s): "VITAMINB12", "FOLATE", "FERRITIN", "TIBC", "IRON", "RETICCTPCT" in the last 72 hours.  Urine analysis:    Component Value Date/Time   COLORURINE AMBER (A) 11/21/2021 1505   APPEARANCEUR CLOUDY (A) 11/21/2021 1505   LABSPEC 1.016 11/21/2021 1505   PHURINE 6.0 11/21/2021 1505   GLUCOSEU >=500 (A) 11/21/2021 1505   HGBUR LARGE (A) 11/21/2021 1505   BILIRUBINUR NEGATIVE 11/21/2021 1505   BILIRUBINUR small 12/31/2016 1710   KETONESUR 80 (A) 11/21/2021 1505   PROTEINUR 100 (A) 11/21/2021 1505   UROBILINOGEN 0.2 12/31/2016 1710   UROBILINOGEN 0.2 03/07/2015 1354   NITRITE NEGATIVE 11/21/2021 1505   LEUKOCYTESUR TRACE (A) 11/21/2021 1505    Sepsis Labs: Lactic Acid, Venous    Component Value Date/Time   LATICACIDVEN 1.7 11/21/2021 1310    MICROBIOLOGY: Recent Results (from the past 240 hour(s))  Blood culture (routine x 2)     Status: None (Preliminary result)   Collection Time: 11/21/21  1:03 PM   Specimen: BLOOD RIGHT HAND  Result Value Ref Range Status   Specimen Description BLOOD RIGHT HAND  Final   Special Requests   Final    BOTTLES DRAWN AEROBIC AND ANAEROBIC Blood Culture adequate volume   Culture   Final    NO GROWTH < 24 HOURS Performed at [facility], [place] [address], [place], [place] [address]    Report Status PENDING  Incomplete  Blood culture (routine x 2)     Status: None (Preliminary result)   Collection Time: 11/21/21  1:08 PM   Specimen: BLOOD LEFT FOREARM  Result Value Ref Range Status   Specimen Description BLOOD LEFT FOREARM  Final   Special Requests   Final    BOTTLES DRAWN AEROBIC AND ANAEROBIC Blood Culture adequate volume   Culture   Final    NO GROWTH < 24 HOURS Performed at [facility], [place] [address] [place],  [address]    Report Status PENDING  Incomplete  Resp Panel by RT-PCR (Flu A&B, Covid) Anterior Nasal Swab     Status: None   Collection Time: 11/21/21   2:10 PM   Specimen: Anterior Nasal Swab  Result Value Ref Range Status   SARS Coronavirus 2 by RT PCR NEGATIVE NEGATIVE Final  Comment: (NOTE) SARS-CoV-2 target nucleic acids are NOT DETECTED.  The SARS-CoV-2 RNA is generally detectable in upper respiratory specimens during the acute phase of infection. The lowest concentration of SARS-CoV-2 viral copies this assay can detect is 138 copies/mL. A negative result does not preclude SARS-Cov-2 infection and should not be used as the sole basis for treatment or other patient management decisions. A negative result may occur with  improper specimen collection/handling, submission of specimen other than nasopharyngeal swab, presence of viral mutation(s) within the areas targeted by this assay, and inadequate number of viral copies(<138 copies/mL). A negative result must be combined with clinical observations, patient history, and epidemiological information. The expected result is Negative.  Fact Sheet for Patients:  EntrepreneurPulse.com.au  Fact Sheet for Healthcare Providers:  IncredibleEmployment.be  This test is no t yet approved or cleared by the [place] FDA and  has been authorized for detection and/or diagnosis of SARS-CoV-2 by FDA under an Emergency Use Authorization (EUA). This EUA will remain  in effect (meaning this test can be used) for the duration of the COVID-19 declaration under Section 564(b)(1) of the Act, 21 U.S.C.section 360bbb-3(b)(1), unless the authorization is terminated  or revoked sooner.       Influenza A by PCR NEGATIVE NEGATIVE Final   Influenza B by PCR NEGATIVE NEGATIVE Final    Comment: (NOTE) The Xpert Xpress SARS-CoV-2/FLU/RSV plus assay is intended as an aid in the diagnosis of influenza from Nasopharyngeal swab specimens and should not be used as a sole basis for treatment. Nasal washings and aspirates are unacceptable for Xpert Xpress  SARS-CoV-2/FLU/RSV testing.  Fact Sheet for Patients: EntrepreneurPulse.com.au  Fact Sheet for Healthcare Providers: IncredibleEmployment.be  This test is not yet approved or cleared by the [place] FDA and has been authorized for detection and/or diagnosis of SARS-CoV-2 by FDA under an Emergency Use Authorization (EUA). This EUA will remain in effect (meaning this test can be used) for the duration of the COVID-19 declaration under Section 564(b)(1) of the Act, 21 U.S.C. section 360bbb-3(b)(1), unless the authorization is terminated or revoked.  Performed at [facility], [place] [address], [place], [place] [address]     RADIOLOGY STUDIES/RESULTS: CT ABDOMEN PELVIS W CONTRAST  Result Date: 11/21/2021 CLINICAL DATA:  Abdominal pain, acute, nonlocalized. Patient was found on the floor. Altered mental status. EXAM: CT ABDOMEN AND PELVIS WITH CONTRAST TECHNIQUE: Multidetector CT imaging of the abdomen and pelvis was performed using the standard protocol following bolus administration of intravenous contrast. RADIATION DOSE REDUCTION: This exam was performed according to the departmental dose-optimization program which includes automated exposure control, adjustment of the mA and/or kV according to patient size and/or use of iterative reconstruction technique. CONTRAST:  29m OMNIPAQUE IOHEXOL 300 MG/ML  SOLN COMPARISON:  CT of the abdomen pelvis 10/28/2021 FINDINGS: Lower chest: Mild dependent atelectasis is present the left base. The heart size is normal. No significant pleural or pericardial effusion is present. Hepatobiliary: Mild diffuse fatty infiltration liver is present. Patient is status post cholecystectomy. The common bile duct is within normal limits. Pancreas: Unremarkable. No pancreatic ductal dilatation or surrounding inflammatory changes. Spleen: Normal in size without focal abnormality. Adrenals/Urinary Tract: Adrenal glands are normal  bilaterally. Bilateral nonobstructing calcifications are similar the prior study. The previously noted 5 mm stone in the midportion of the right kidney is no longer present. A nonobstructing 5 mm stone at the lower pole left kidney is present. There is some enhancement of the ureteral wall bilaterally. Bilateral renal simple  cysts are present. Recommend a follow-up. Small amount of gas is present within the urinary bladder. Stomach/Bowel: The stomach and duodenum are within normal limits. Small bowel is unremarkable. Terminal ileum is within normal limits. Appendectomy noted. The ascending and transverse colon are within normal limits. Descending and sigmoid colon are unremarkable. Vascular/Lymphatic: Atherosclerotic calcifications are present the aorta branch vessels. No aneurysm is present. Reproductive: Status post hysterectomy. No adnexal masses. Other: No abdominal wall hernia or abnormality. No abdominopelvic ascites. Musculoskeletal: Disc disease again noted at L4-5 with central and foraminal stenosis. No focal osseous lesions are present. The bony pelvis is within normal limits. The hips are located and scratched at the hips are located bilaterally. Left proximal femur ORIF is again noted. IMPRESSION: 1. Bilateral nonobstructing nephrolithiasis. 2. Enhancement of the ureteral wall bilaterally raises concern for urinary tract infection. This may represent some residual inflammatory change from recent stents. 3. Small amount of gas within the urinary bladder. This may be related to recent instrumentation. This could be related to infection. 4. Hepatic steatosis. 5. Cholecystectomy, hysterectomy, and appendectomy. 6. Degenerative changes in the lower lumbar spine are stable. 7. Aortic Atherosclerosis (ICD10-I70.0). Electronically Signed   By: San Morelle M.D.   On: 11/21/2021 17:15   DG Chest 2 View  Result Date: 11/21/2021 CLINICAL DATA:  Altered mental status, diabetes EXAM: CHEST - 2 VIEW  COMPARISON:  11/02/2021 FINDINGS: Normal heart size, mediastinal contours, and pulmonary vascularity. Atherosclerotic calcification aorta. Lungs clear. No pulmonary infiltrate, pleural effusion, or pneumothorax. Osseous demineralization. IMPRESSION: No acute abnormalities. Aortic Atherosclerosis (ICD10-I70.0). Electronically Signed   By: Lavonia Dana M.D.   On: 11/21/2021 14:32   CT Cervical Spine Wo Contrast  Result Date: 11/21/2021 CLINICAL DATA:  Altered mental status.  Fall with pain. EXAM: CT HEAD WITHOUT CONTRAST CT CERVICAL SPINE WITHOUT CONTRAST TECHNIQUE: Multidetector CT imaging of the head and cervical spine was performed following the standard protocol without intravenous contrast. Multiplanar CT image reconstructions of the cervical spine were also generated. RADIATION DOSE REDUCTION: This exam was performed according to the departmental dose-optimization program which includes automated exposure control, adjustment of the mA and/or kV according to patient size and/or use of iterative reconstruction technique. COMPARISON:  11/02/2021. FINDINGS: CT HEAD FINDINGS Brain: No evidence of acute infarction, hemorrhage, hydrocephalus, extra-axial collection or mass lesion/mass effect. Stable aneurysm clip above the anterior right temporal lobe. Vascular: No hyperdense vessel or unexpected calcification. Skull: Previous right frontotemporal craniotomy. No fracture. No bone lesion. Sinuses/Orbits: Globes and orbits are unremarkable. Sinuses are clear. Other: None. CT CERVICAL SPINE FINDINGS Alignment: Normal. Skull base and vertebrae: No acute fracture. No primary bone lesion or focal pathologic process. Soft tissues and spinal canal: No prevertebral fluid or swelling. No visible canal hematoma. Disc levels: Disc spaces are well maintained. No evidence of a disc herniation or of significant disc bulging. No stenosis. Upper chest: Negative. Other: None. IMPRESSION: HEAD CT 1. No acute intracranial  abnormalities. CERVICAL CT 1. Normal. Electronically Signed   By: Lajean Manes M.D.   On: 11/21/2021 14:12   CT HEAD WO CONTRAST (5MM)  Result Date: 11/21/2021 CLINICAL DATA:  Altered mental status.  Fall with pain. EXAM: CT HEAD WITHOUT CONTRAST CT CERVICAL SPINE WITHOUT CONTRAST TECHNIQUE: Multidetector CT imaging of the head and cervical spine was performed following the standard protocol without intravenous contrast. Multiplanar CT image reconstructions of the cervical spine were also generated. RADIATION DOSE REDUCTION: This exam was performed according to the departmental dose-optimization program which includes automated exposure  control, adjustment of the mA and/or kV according to patient size and/or use of iterative reconstruction technique. COMPARISON:  11/02/2021. FINDINGS: CT HEAD FINDINGS Brain: No evidence of acute infarction, hemorrhage, hydrocephalus, extra-axial collection or mass lesion/mass effect. Stable aneurysm clip above the anterior right temporal lobe. Vascular: No hyperdense vessel or unexpected calcification. Skull: Previous right frontotemporal craniotomy. No fracture. No bone lesion. Sinuses/Orbits: Globes and orbits are unremarkable. Sinuses are clear. Other: None. CT CERVICAL SPINE FINDINGS Alignment: Normal. Skull base and vertebrae: No acute fracture. No primary bone lesion or focal pathologic process. Soft tissues and spinal canal: No prevertebral fluid or swelling. No visible canal hematoma. Disc levels: Disc spaces are well maintained. No evidence of a disc herniation or of significant disc bulging. No stenosis. Upper chest: Negative. Other: None. IMPRESSION: HEAD CT 1. No acute intracranial abnormalities. CERVICAL CT 1. Normal. Electronically Signed   By: Lajean Manes M.D.   On: 11/21/2021 14:12     LOS: 1 day   Oren Binet, MD  [facility]    To contact the attending provider between 7A-7P or the covering provider during after hours 7P-7A, please log  into the web site [email or web address] and access using universal [place] password for that web site. If you do not have the password, please call the hospital operator.  11/22/2021, 10:25 AM

## 2021-11-22 NOTE — ED Notes (Signed)
Wasted '1mg'$  morphine with Jimmie Molly, RN,

## 2021-11-22 NOTE — Evaluation (Signed)
Physical Therapy Evaluation Patient Details Name: Laura Gentry MRN: [id number] DOB: 08-Aug-1955 Today's Date: 11/22/2021  History of Present Illness  66 y/o female presented to ED on 11/21/21 after being found down. Recent hospitalization for L hip fx 6/11-6/16 and sepsis 2/2 UTI 6/6-6/10. Admitted acute metabolic encephalopathy. PMH: cerebral aneurysm s/p clipping, diabetes, thyroid disease  Clinical Impression  Patient admitted with the above. PTA, patient lives with mother and sister and utilizing RW for mobility. Patient presents with weakness, impaired balance, decreased activity tolerance. Patient required supervision-min guard for transfers and ambulation with RW. Mother and sister do not voice concerns about cognition. Seems to be at baseline (A&Ox4). Patient will benefit from skilled PT services during acute stay to address listed deficits. Recommend return home with HHPT to maximize functional independence and safety.        Recommendations for follow up therapy are one component of a multi-disciplinary discharge planning process, led by the attending physician.  Recommendations may be updated based on patient status, additional functional criteria and insurance authorization.  Follow Up Recommendations Home health PT      Assistance Recommended at Discharge Frequent or constant Supervision/Assistance  Patient can return home with the following  A little help with walking and/or transfers;A little help with bathing/dressing/bathroom;Assistance with cooking/housework;Help with stairs or ramp for entrance    Equipment Recommendations None recommended by PT  Recommendations for Other Services       Functional Status Assessment Patient has had a recent decline in their functional status and demonstrates the ability to make significant improvements in function in a reasonable and predictable amount of time.     Precautions / Restrictions Precautions Precautions:  Fall Restrictions Weight Bearing Restrictions: No LUE Weight Bearing: Weight bearing as tolerated LLE Weight Bearing: Weight bearing as tolerated      Mobility  Bed Mobility Overal bed mobility: Modified Independent             General bed mobility comments: increased time to complete but able to come to EOb without assistance    Transfers Overall transfer level: Needs assistance Equipment used: Rolling Ramond Darnell (2 wheels) Transfers: Sit to/from Stand Sit to Stand: Supervision                Ambulation/Gait Ambulation/Gait assistance: Min guard Gait Distance (Feet): 100 Feet (100') Assistive device: Rolling Dewaine Morocho (2 wheels) Gait Pattern/deviations: Step-through pattern, Decreased stride length, Decreased stance time - left Gait velocity: Decreased     General Gait Details: min guard for safety. Ambulated to her mom's room on same floor. Seated rest break and ambulated back to room. No LOB noted  Stairs            Wheelchair Mobility    Modified Rankin (Stroke Patients Only)       Balance Overall balance assessment: Needs assistance Sitting-balance support: Feet supported Sitting balance-Leahy Scale: Good     Standing balance support: Bilateral upper extremity supported, During functional activity, Reliant on assistive device for balance Standing balance-Leahy Scale: Poor Standing balance comment: reliant on RW                             Pertinent Vitals/Pain Pain Assessment Pain Assessment: No/denies pain    Home Living Family/patient expects to be discharged to:: Private residence Living Arrangements: Parent;Other relatives Available Help at Discharge: Family;Available 24 hours/day Type of Home: House Home Access: Stairs to enter   [employer] of Steps: 5 total  Alternate Level Stairs-Number of Steps: flight Home Layout: Multi-level;1/2 bath on main level Home Equipment: Rolling Sofia Vanmeter (2 wheels);Shower seat -  built in;Hand held shower head;Grab bars - tub/shower Additional Comments: lives with her sister and her mother who is 79    Prior Function Prior Level of Function : Independent/Modified Independent             Mobility Comments: uses RW currently following hip fx ADLs Comments: independent     Hand Dominance        Extremity/Trunk Assessment   Upper Extremity Assessment Upper Extremity Assessment: Defer to OT evaluation    Lower Extremity Assessment Lower Extremity Assessment: Generalized weakness (L>R)    Cervical / Trunk Assessment Cervical / Trunk Assessment: Normal  Communication   Communication: No difficulties  Cognition Arousal/Alertness: Awake/alert Behavior During Therapy: WFL for tasks assessed/performed Overall Cognitive Status: Within Functional Limits for tasks assessed                                 General Comments: A&Ox4. Mother and sister have no concerns about cognition. Seems at baseline        General Comments      Exercises     Assessment/Plan    PT Assessment Patient needs continued PT services  PT Problem List Decreased strength;Decreased mobility;Decreased balance;Pain;Decreased activity tolerance;Decreased range of motion       PT Treatment Interventions DME instruction;Therapeutic exercise;Gait training;Stair training;Functional mobility training;Therapeutic activities;Patient/family education;Balance training    PT Goals (Current goals can be found in the Care Plan section)  Acute Rehab PT Goals Patient Stated Goal: to go home PT Goal Formulation: With patient Time For Goal Achievement: 12/06/21 Potential to Achieve Goals: Good    Frequency Min 3X/week     Co-evaluation               AM-PAC PT "6 Clicks" Mobility  Outcome Measure Help needed turning from your back to your side while in a flat bed without using bedrails?: None Help needed moving from lying on your back to sitting on the side of a  flat bed without using bedrails?: None Help needed moving to and from a bed to a chair (including a wheelchair)?: A Little Help needed standing up from a chair using your arms (e.g., wheelchair or bedside chair)?: A Little Help needed to walk in hospital room?: A Little Help needed climbing 3-5 steps with a railing? : A Little 6 Click Score: 20    End of Session Equipment Utilized During Treatment: Gait belt Activity Tolerance: Patient tolerated treatment well Patient left: in chair;with call bell/phone within reach;with chair alarm set Nurse Communication: Mobility status PT Visit Diagnosis: Other abnormalities of gait and mobility (R26.89);Pain;Unsteadiness on feet (R26.81);History of falling (Z91.81) Pain - Right/Left: Left Pain - part of body: Leg    Time: 9798-9211 PT Time Calculation (min) (ACUTE ONLY): 39 min   Charges:   PT Evaluation $PT Eval Moderate Complexity: 1 Mod PT Treatments $Gait Training: 8-22 mins $Therapeutic Activity: 8-22 mins        Haadi Santellan A. Gilford Rile PT, DPT Acute Rehabilitation Services Office [phone]   Linna Hoff 11/22/2021, 1:32 PM

## 2021-11-22 NOTE — TOC Initial Note (Signed)
Transition of Care [facility]) - Initial/Assessment Note    Patient Details  Name: Laura Gentry MRN: [id number] Date of Birth: 1956/02/11  Transition of Care [facility]) CM/SW Contact:    Carles Collet, RN Phone Number: 11/22/2021, 2:18 PM  Clinical Narrative:            Confirmed with patient that she has needed DME at home and would like to resume [facility] services with [facility]. [place] liaison notified of admission.  Placed resumption order for [facility] TOC will continue to follow       Expected Discharge Plan: [place] Barriers to Discharge: Continued Medical Work up   Patient Goals and CMS Choice Patient states their goals for this hospitalization and ongoing recovery are:: to return home CMS Medicare.gov Compare Post Acute Care list provided to:: Patient Choice offered to / list presented to : Patient  Expected Discharge Plan and Services Expected Discharge Plan: [place]   Discharge Planning Services: CM Consult Post Acute Care Choice: [place] arrangements for the past 2 months: Single Family Home                 DME Arranged: N/A         HH Arranged: PT, OT HH Agency: [facility] Date [facility] Agency Contacted: 11/22/21 Time HH Agency Contacted: 21 Representative spoke with at [place]: Tommi Rumps  Prior Living Arrangements/Services Living arrangements for the past 2 months: Single Family Home                Current home services: DME (walker commode shower chair)    Activities of Daily Living Home Assistive Devices/Equipment: Dentures (specify type), Eyeglasses, Walker (specify type) ADL Screening (condition at time of admission) Patient's cognitive ability adequate to safely complete daily activities?: Yes Is the patient deaf or have difficulty hearing?: No Does the patient have difficulty seeing, even when wearing glasses/contacts?: No Does the patient have difficulty concentrating, remembering, or making decisions?:  Yes Patient able to express need for assistance with ADLs?: Yes Does the patient have difficulty dressing or bathing?: No Independently performs ADLs?: Yes (appropriate for developmental age) Communication: Independent Dressing (OT): Needs assistance Is this a change from baseline?: Pre-admission baseline Grooming: Independent Feeding: Independent Bathing: Needs assistance Is this a change from baseline?: Pre-admission baseline Toileting: Needs assistance Is this a change from baseline?: Pre-admission baseline In/Out Bed: Needs assistance Is this a change from baseline?: Pre-admission baseline Walks in Home: Needs assistance Is this a change from baseline?: Pre-admission baseline Does the patient have difficulty walking or climbing stairs?: No Weakness of Legs: Left Weakness of Arms/Hands: Left  Permission Sought/Granted                  Emotional Assessment              Admission diagnosis:  DKA (diabetic ketoacidosis) ([place]) [E11.10] Acute cystitis with hematuria [N30.01] Altered mental status, unspecified altered mental status type [R41.82] Patient Active Problem List   Diagnosis Date Noted   Acute metabolic encephalopathy 24/40/1027   Sepsis secondary to UTI ([place]) 11/21/2021   Acute urinary retention 11/04/2021   Hematuria 11/04/2021   Anemia, unspecified 11/03/2021   Hip fracture ([place]) 11/02/2021   Left elbow fracture 11/02/2021   S/p left hip fracture 11/02/2021   Essential hypertension 25/36/6440   Complicated UTI (urinary tract infection) 11/02/2021   Severe sepsis ([place]) 10/29/2021   DKA (diabetic ketoacidosis) ([place]) 10/29/2021   AKI (acute kidney  injury) ([place]) 10/29/2021   Acquired hypothyroidism 10/29/2021   Ureteral obstruction, right 10/28/2021   Breast cancer screening by mammogram 01/31/2018   Need for immunization against influenza 01/31/2018   Elevated blood pressure reading 01/31/2018   Migraines 09/22/2016   Tremor of both hands 09/22/2016    History of colonic polyps 09/22/2016   HLD (hyperlipidemia) 03/07/2015   Difficulty sleeping 03/07/2015   Type 2 diabetes mellitus with peripheral neuropathy ([place]) 03/07/2015   Fatigue 03/07/2015   UTI (urinary tract infection) 03/07/2015   History of intracranial aneurysm 03/17/2013   History of depression 02/01/2013   Disorder of esophagus 08/19/2012   Calculus of kidney 09/22/2011   H/O malignant neoplasm of skin 09/22/2011   PCP:  Seward Carol, MD Pharmacy:   [facility] [phone] [address], [address] AT [facility] [place] [address] Phone: [phone] Fax: [phone]     Social Determinants of Health (SDOH) Interventions    Readmission Risk Interventions     No data to display

## 2021-11-22 NOTE — Progress Notes (Addendum)
Inpatient Diabetes Program Recommendations  AACE/ADA: New Consensus Statement on Inpatient Glycemic Control (2015)  Target Ranges:  Prepandial:   less than 140 mg/dL      Peak postprandial:   less than 180 mg/dL (1-2 hours)      Critically ill patients:  140 - 180 mg/dL   Lab Results  Component Value Date   GLUCAP 178 (H) 11/22/2021   HGBA1C 8.8 (H) 10/29/2021    Review of Glycemic Control  Latest Reference Range & Units 11/22/21 06:55 11/22/21 08:16 11/22/21 09:42  Glucose-Capillary 70 - 99 mg/dL 112 (H) 179 (H) 178 (H)   Diabetes history: DM 2 Outpatient Diabetes medications:  Lantus 25 units, Invokamet XR 50-1000 mg  Current orders for Inpatient glycemic control:  Novolog 0-9 units tid with meals Semglee 25 units daily Inpatient Diabetes Program Recommendations:    Note patient was on Invokament which increases risk for UTI and DKA. Recommend d/c of SGLT-2.  Will follow.   Thanks,  Adah Perl, RN, BC-ADM Inpatient Diabetes Coordinator Pager [phone]  (8a-5p)

## 2021-11-23 ENCOUNTER — Other Ambulatory Visit: Payer: Self-pay

## 2021-11-23 DIAGNOSIS — G9341 Metabolic encephalopathy: Secondary | ICD-10-CM | POA: Diagnosis not present

## 2021-11-23 LAB — TYPE AND SCREEN
ABO/RH(D): O POS
Antibody Screen: NEGATIVE

## 2021-11-23 LAB — CBC
HCT: 26.7 % — ABNORMAL LOW (ref 36.0–46.0)
HCT: 33 % — ABNORMAL LOW (ref 36.0–46.0)
Hemoglobin: 8.5 g/dL — ABNORMAL LOW (ref 12.0–15.0)
Hemoglobin: 10.2 g/dL — ABNORMAL LOW (ref 12.0–15.0)
MCH: 24.6 pg — ABNORMAL LOW (ref 26.0–34.0)
MCH: 24 pg — ABNORMAL LOW (ref 26.0–34.0)
MCHC: 31.8 g/dL (ref 30.0–36.0)
MCHC: 30.9 g/dL (ref 30.0–36.0)
MCV: 77.2 fL — ABNORMAL LOW (ref 80.0–100.0)
MCV: 77.6 fL — ABNORMAL LOW (ref 80.0–100.0)
Platelets: 241 10*3/uL (ref 150–400)
Platelets: 307 10*3/uL (ref 150–400)
RBC: 3.46 MIL/uL — ABNORMAL LOW (ref 3.87–5.11)
RBC: 4.25 MIL/uL (ref 3.87–5.11)
RDW: 14.4 % (ref 11.5–15.5)
RDW: 14.5 % (ref 11.5–15.5)
WBC: 9 10*3/uL (ref 4.0–10.5)
WBC: 11.2 10*3/uL — ABNORMAL HIGH (ref 4.0–10.5)
nRBC: 0 % (ref 0.0–0.2)
nRBC: 0 % (ref 0.0–0.2)

## 2021-11-23 LAB — BASIC METABOLIC PANEL
Anion gap: 6 (ref 5–15)
BUN: 9 mg/dL (ref 8–23)
CO2: 24 mmol/L (ref 22–32)
Calcium: 7.9 mg/dL — ABNORMAL LOW (ref 8.9–10.3)
Chloride: 105 mmol/L (ref 98–111)
Creatinine, Ser: 0.97 mg/dL (ref 0.44–1.00)
GFR, Estimated: >60 mL/min (ref >60)
Glucose, Bld: 145 mg/dL — ABNORMAL HIGH (ref 70–99)
Potassium: 3.2 mmol/L — ABNORMAL LOW (ref 3.5–5.1)
Sodium: 135 mmol/L (ref 135–145)

## 2021-11-23 LAB — GLUCOSE, CAPILLARY
Glucose-Capillary: 148 mg/dL — ABNORMAL HIGH (ref 70–99)
Glucose-Capillary: 162 mg/dL — ABNORMAL HIGH (ref 70–99)
Glucose-Capillary: 136 mg/dL — ABNORMAL HIGH (ref 70–99)
Glucose-Capillary: 192 mg/dL — ABNORMAL HIGH (ref 70–99)
Glucose-Capillary: 175 mg/dL — ABNORMAL HIGH (ref 70–99)

## 2021-11-23 LAB — URINE CULTURE: Culture: NO GROWTH

## 2021-11-23 LAB — MAGNESIUM: Magnesium: 2.3 mg/dL (ref 1.7–2.4)

## 2021-11-23 MED ORDER — LACTATED RINGERS IV BOLUS
1000.0000 mL | Freq: Once | INTRAVENOUS | Status: AC
  Administered 2021-11-23: 1000 mL via INTRAVENOUS

## 2021-11-23 MED ORDER — POTASSIUM CHLORIDE CRYS ER 20 MEQ PO TBCR
40.0000 meq | EXTENDED_RELEASE_TABLET | Freq: Once | ORAL | Status: AC
  Administered 2021-11-23: 40 meq via ORAL
  Filled 2021-11-23: qty 2

## 2021-11-23 MED ORDER — POTASSIUM CHLORIDE CRYS ER 20 MEQ PO TBCR
20.0000 meq | EXTENDED_RELEASE_TABLET | Freq: Once | ORAL | Status: AC
  Administered 2021-11-23: 20 meq via ORAL
  Filled 2021-11-23: qty 1

## 2021-11-23 MED ORDER — LACTATED RINGERS IV SOLN: INTRAVENOUS | Status: AC

## 2021-11-23 MED ORDER — PROPRANOLOL HCL ER 80 MG PO CP24
80.0000 mg | ORAL_CAPSULE | Freq: Every day | ORAL | Status: DC
  Administered 2021-11-23 – 2021-11-24 (×2): 80 mg via ORAL
  Filled 2021-11-23 (×2): qty 1

## 2021-11-23 NOTE — Progress Notes (Signed)
PROGRESS NOTE        PATIENT DETAILS Name: Laura Gentry Age: 66 y.o. Sex: female Date of Birth: July 11, 1955 Admit Date: 11/21/2021 Admitting Physician Orene Desanctis, DO LOV:FIEPPI, Jori Moll, MD  Brief Summary: Patient is a 66 y.o.  female with history of DM2, HLD, HTN-who recently was hospitalized for sepsis due to obstructive renal stone-requiring renal stone placement-and then for a left hip fracture following a mechanical fall requiring surgical correction-presented to the hospital with fever and confusion.  See below for further details.   Significant events: 6/6-6/10>> hospitalization for sepsis due to complicated UTI in the setting of obstructive right ureteral stone-s/p cystoscopy with bilateral stent placement. 6/11-6/16>> hospitalization for left hip fracture-requiring surgical treatment. 6/28>> cystoscopy/lithotripsy/exchange of bilateral renal stents. 6/30>> admit to [facility] for fever/confusion-found to be in DKA-suspected sepsis due to recent urological instrumentation (was on oral antibiotics at home)  Significant studies: 6/30>> CT head: No acute intracranial abnormality 6/30>> CT C-spine: No fracture/subluxation 6/30>> CXR: No PNA 6/30>> CT abdomen/pelvis:: Bilateral nonobstructing nephrolithiasis, enhancement of ureteral wall, small amount of gas within the urinary bladder  Significant microbiology data: 6/30>> COVID/influenza PCR: Negative 6/30>> blood culture: No growth  Procedures: 6/7>> cystoscopy with bilateral stent placement by Dr. Tresa Moore 6/13>> left hip cannulated screw placement by Dr Sammuel Hines 6/28>> cystoscopy-ureteroscopy-laser lithotripsy-exchange of bilateral stents.  Consults: None  Subjective:  Patient in bed, appears comfortable, denies any headache, no fever, no chest pain or pressure, no shortness of breath , no abdominal pain. No new focal weakness.   Objective: Vitals: Blood pressure 118/69, pulse 78, temperature 99 F  (37.2 C), temperature source Oral, resp. rate 20, height '5\' 7"'$  (1.702 m), weight 71 kg, SpO2 97 %.   Exam:  Awake Alert, No new F.N deficits, Normal affect [place].AT,PERRAL Supple Neck, No JVD,   Symmetrical Chest wall movement, Good air movement bilaterally, CTAB RRR,No Gallops, Rubs or new Murmurs,  +ve B.Sounds, Abd Soft, No tenderness,   No Cyanosis, Clubbing or edema    Assessment/Plan:  DKA: Resolved-likely in the setting of acute infection-Invokana use.  Transitioning to SQ insulin.  Acute metabolic encephalopathy: Due to DKA/sepsis physiology-CT imaging negative-encephalopathy has resolved as she is completely awake and alert this morning  Sepsis (POA) due to complicated UTI/pyelonephritis: Suspect related to recent urological instrumentation-was on oral antimicrobial therapy at home-hence cultures may be sterile.  Clinically improved-no further fever overnight-encephalopathy has resolved-continue IV antibiotics and follow culture data.  Spoke with urologist on-call-Dr. McKenzie-recommends that we treat her as if she has pyelonephritis for total of 7 to 10 days.  Recent left hip fracture: S/p left hip cannulated screw placement on 6/13-continue supportive care.  Reviewed discharge summary-recommendations were for ASA x4 weeks for VTE prophylaxis.  Continue prophylactic Lovenox while hospitalized-can resume ASA on discharge.  Mobilize with PT.  HLD: Continue statin  HTN: BP soft, gentle IV fluids, beta-blocker dose reduced.  Monitor.  Hypokalemia/hypomagnesemia: Replete and recheck.  DM-2 (A1c 8.8): CBG stable-transitioning to SQ insulin/SSI-follow and adjust.  Given she came in DKA-will need to discontinue Invokana on discharge.  Recent Labs    11/22/21 2346 11/23/21 0356 11/23/21 0741  GLUCAP 140* 148* 162*     BMI: Estimated body mass index is 24.52 kg/m as calculated from the following:   Height as of this encounter: '5\' 7"'$  (1.702 m).   Weight as of this encounter:  71 kg.   Code status:   Code Status: Full Code   DVT Prophylaxis: enoxaparin (LOVENOX) injection 40 mg Start: 11/21/21 2100   Family Communication: Sister at bedside   Disposition Plan: Status is: Inpatient Remains inpatient appropriate because: Resolving sepsis physiology-getting off insulin infusion-not yet stable for discharge.   Planned Discharge Destination:Home   Diet: Diet Order             Diet Carb Modified Fluid consistency: Thin; Room service appropriate? Yes  Diet effective now                     MEDICATIONS: Scheduled Meds:  atorvastatin  40 mg Oral Daily   DULoxetine  60 mg Oral Daily   enoxaparin (LOVENOX) injection  40 mg Subcutaneous Q24H   gabapentin  300 mg Oral BID   gabapentin  600 mg Oral QHS   insulin aspart  0-9 Units Subcutaneous TID WC   insulin glargine-yfgn  25 Units Subcutaneous Daily   pantoprazole  40 mg Oral Daily   potassium chloride  20 mEq Oral Once   propranolol ER  80 mg Oral QHS   vitamin B-12  1,000 mcg Oral Daily   Continuous Infusions:  cefTRIAXone (ROCEPHIN)  IV 2 g (11/23/21 0043)   PRN Meds:.acetaminophen, dextrose, hydrALAZINE, ondansetron (ZOFRAN) IV, traZODone   I have personally reviewed following labs and imaging studies  LABORATORY DATA: Recent Labs  Lab 11/19/21 1325 11/21/21 1310 11/21/21 1345 11/21/21 1346 11/21/21 1538 11/23/21 0225 11/23/21 0710  WBC 11.3* 8.7  --   --   --  9.0 11.2*  HGB 11.3* 11.6* 11.9* 11.9* 9.9* 8.5* 10.2*  HCT 36.0 38.3 35.0* 35.0* 29.0* 26.7* 33.0*  PLT 467* 300  --   --   --  241 307  MCV 78.1* 82.4  --   --   --  77.2* 77.6*  MCH 24.5* 24.9*  --   --   --  24.6* 24.0*  MCHC 31.4 30.3  --   --   --  31.8 30.9  RDW 14.2 14.2  --   --   --  14.4 14.5  LYMPHSABS  --  0.7  --   --   --   --   --   MONOABS  --  0.6  --   --   --   --   --   EOSABS  --  0.0  --   --   --   --   --   BASOSABS  --  0.0  --   --   --   --   --     Recent Labs  Lab 11/21/21 1310  11/21/21 1325 11/21/21 1345 11/21/21 1924 11/21/21 2125 11/22/21 0143 11/22/21 0416 11/23/21 0225  NA 132*  --    < > 134* 133* 136 137 135  K 4.0  --    < > 3.3* 3.0* 2.9* 3.3* 3.2*  CL 95*  --    < > 99 100 99 102 105  CO2 16*  --    < > '25 24 23 25 24  '$ GLUCOSE 303*  --    < > 204* 155* 198* 168* 145*  BUN 16  --    < > '13 12 11 10 9  '$ CREATININE 0.96  --    < > 0.88 0.74 0.73 0.75 0.97  CALCIUM 9.1  --    < > 8.6* 8.4* 8.8* 8.7* 7.9*  AST 17  --   --   --   --   --   --   --  ALT 14  --   --   --   --   --   --   --   ALKPHOS 104  --   --   --   --   --   --   --   BILITOT 1.1  --   --   --   --   --   --   --   ALBUMIN 3.0*  --   --   --   --   --   --   --   MG  --   --   --   --  1.4*  --   --  2.3  LATICACIDVEN 1.7  --   --   --   --   --   --   --   INR  --  1.1  --   --   --   --   --   --    < > = values in this interval not displayed.       RADIOLOGY STUDIES/RESULTS: CT ABDOMEN PELVIS W CONTRAST  Result Date: 11/21/2021 CLINICAL DATA:  Abdominal pain, acute, nonlocalized. Patient was found on the floor. Altered mental status. EXAM: CT ABDOMEN AND PELVIS WITH CONTRAST TECHNIQUE: Multidetector CT imaging of the abdomen and pelvis was performed using the standard protocol following bolus administration of intravenous contrast. RADIATION DOSE REDUCTION: This exam was performed according to the departmental dose-optimization program which includes automated exposure control, adjustment of the mA and/or kV according to patient size and/or use of iterative reconstruction technique. CONTRAST:  3m OMNIPAQUE IOHEXOL 300 MG/ML  SOLN COMPARISON:  CT of the abdomen pelvis 10/28/2021 FINDINGS: Lower chest: Mild dependent atelectasis is present the left base. The heart size is normal. No significant pleural or pericardial effusion is present. Hepatobiliary: Mild diffuse fatty infiltration liver is present. Patient is status post cholecystectomy. The common bile duct is within normal  limits. Pancreas: Unremarkable. No pancreatic ductal dilatation or surrounding inflammatory changes. Spleen: Normal in size without focal abnormality. Adrenals/Urinary Tract: Adrenal glands are normal bilaterally. Bilateral nonobstructing calcifications are similar the prior study. The previously noted 5 mm stone in the midportion of the right kidney is no longer present. A nonobstructing 5 mm stone at the lower pole left kidney is present. There is some enhancement of the ureteral wall bilaterally. Bilateral renal simple cysts are present. Recommend a follow-up. Small amount of gas is present within the urinary bladder. Stomach/Bowel: The stomach and duodenum are within normal limits. Small bowel is unremarkable. Terminal ileum is within normal limits. Appendectomy noted. The ascending and transverse colon are within normal limits. Descending and sigmoid colon are unremarkable. Vascular/Lymphatic: Atherosclerotic calcifications are present the aorta branch vessels. No aneurysm is present. Reproductive: Status post hysterectomy. No adnexal masses. Other: No abdominal wall hernia or abnormality. No abdominopelvic ascites. Musculoskeletal: Disc disease again noted at L4-5 with central and foraminal stenosis. No focal osseous lesions are present. The bony pelvis is within normal limits. The hips are located and scratched at the hips are located bilaterally. Left proximal femur ORIF is again noted. IMPRESSION: 1. Bilateral nonobstructing nephrolithiasis. 2. Enhancement of the ureteral wall bilaterally raises concern for urinary tract infection. This may represent some residual inflammatory change from recent stents. 3. Small amount of gas within the urinary bladder. This may be related to recent instrumentation. This could be related to infection. 4. Hepatic steatosis. 5. Cholecystectomy, hysterectomy, and appendectomy. 6. Degenerative changes in the lower lumbar spine  are stable. 7. Aortic Atherosclerosis  (ICD10-I70.0). Electronically Signed   By: San Morelle M.D.   On: 11/21/2021 17:15   DG Chest 2 View  Result Date: 11/21/2021 CLINICAL DATA:  Altered mental status, diabetes EXAM: CHEST - 2 VIEW COMPARISON:  11/02/2021 FINDINGS: Normal heart size, mediastinal contours, and pulmonary vascularity. Atherosclerotic calcification aorta. Lungs clear. No pulmonary infiltrate, pleural effusion, or pneumothorax. Osseous demineralization. IMPRESSION: No acute abnormalities. Aortic Atherosclerosis (ICD10-I70.0). Electronically Signed   By: Lavonia Dana M.D.   On: 11/21/2021 14:32   CT Cervical Spine Wo Contrast  Result Date: 11/21/2021 CLINICAL DATA:  Altered mental status.  Fall with pain. EXAM: CT HEAD WITHOUT CONTRAST CT CERVICAL SPINE WITHOUT CONTRAST TECHNIQUE: Multidetector CT imaging of the head and cervical spine was performed following the standard protocol without intravenous contrast. Multiplanar CT image reconstructions of the cervical spine were also generated. RADIATION DOSE REDUCTION: This exam was performed according to the departmental dose-optimization program which includes automated exposure control, adjustment of the mA and/or kV according to patient size and/or use of iterative reconstruction technique. COMPARISON:  11/02/2021. FINDINGS: CT HEAD FINDINGS Brain: No evidence of acute infarction, hemorrhage, hydrocephalus, extra-axial collection or mass lesion/mass effect. Stable aneurysm clip above the anterior right temporal lobe. Vascular: No hyperdense vessel or unexpected calcification. Skull: Previous right frontotemporal craniotomy. No fracture. No bone lesion. Sinuses/Orbits: Globes and orbits are unremarkable. Sinuses are clear. Other: None. CT CERVICAL SPINE FINDINGS Alignment: Normal. Skull base and vertebrae: No acute fracture. No primary bone lesion or focal pathologic process. Soft tissues and spinal canal: No prevertebral fluid or swelling. No visible canal hematoma. Disc  levels: Disc spaces are well maintained. No evidence of a disc herniation or of significant disc bulging. No stenosis. Upper chest: Negative. Other: None. IMPRESSION: HEAD CT 1. No acute intracranial abnormalities. CERVICAL CT 1. Normal. Electronically Signed   By: Lajean Manes M.D.   On: 11/21/2021 14:12   CT HEAD WO CONTRAST (5MM)  Result Date: 11/21/2021 CLINICAL DATA:  Altered mental status.  Fall with pain. EXAM: CT HEAD WITHOUT CONTRAST CT CERVICAL SPINE WITHOUT CONTRAST TECHNIQUE: Multidetector CT imaging of the head and cervical spine was performed following the standard protocol without intravenous contrast. Multiplanar CT image reconstructions of the cervical spine were also generated. RADIATION DOSE REDUCTION: This exam was performed according to the departmental dose-optimization program which includes automated exposure control, adjustment of the mA and/or kV according to patient size and/or use of iterative reconstruction technique. COMPARISON:  11/02/2021. FINDINGS: CT HEAD FINDINGS Brain: No evidence of acute infarction, hemorrhage, hydrocephalus, extra-axial collection or mass lesion/mass effect. Stable aneurysm clip above the anterior right temporal lobe. Vascular: No hyperdense vessel or unexpected calcification. Skull: Previous right frontotemporal craniotomy. No fracture. No bone lesion. Sinuses/Orbits: Globes and orbits are unremarkable. Sinuses are clear. Other: None. CT CERVICAL SPINE FINDINGS Alignment: Normal. Skull base and vertebrae: No acute fracture. No primary bone lesion or focal pathologic process. Soft tissues and spinal canal: No prevertebral fluid or swelling. No visible canal hematoma. Disc levels: Disc spaces are well maintained. No evidence of a disc herniation or of significant disc bulging. No stenosis. Upper chest: Negative. Other: None. IMPRESSION: HEAD CT 1. No acute intracranial abnormalities. CERVICAL CT 1. Normal. Electronically Signed   By: Lajean Manes M.D.    On: 11/21/2021 14:12     LOS: 2 days   Signature  Lala Lund M.D on 11/23/2021 at 10:30 AM   -  To page go to [email or web address]

## 2021-11-23 NOTE — Progress Notes (Addendum)
Patient developed asymptomatic hypotension. She had received 120 mg propranolol at 22:20.   She is afebrile and WBC is normal. Hgb has trended down to 8.5 without signs of bleeding. Plan to give IVF bolus and repeat H&H later this morning.

## 2021-11-24 ENCOUNTER — Other Ambulatory Visit (HOSPITAL_COMMUNITY): Payer: Self-pay

## 2021-11-24 DIAGNOSIS — G9341 Metabolic encephalopathy: Secondary | ICD-10-CM | POA: Diagnosis not present

## 2021-11-24 LAB — BASIC METABOLIC PANEL
Anion gap: 10 (ref 5–15)
BUN: 6 mg/dL — ABNORMAL LOW (ref 8–23)
CO2: 25 mmol/L (ref 22–32)
Calcium: 8.1 mg/dL — ABNORMAL LOW (ref 8.9–10.3)
Chloride: 100 mmol/L (ref 98–111)
Creatinine, Ser: 0.79 mg/dL (ref 0.44–1.00)
GFR, Estimated: >60 mL/min (ref >60)
Glucose, Bld: 185 mg/dL — ABNORMAL HIGH (ref 70–99)
Potassium: 3.5 mmol/L (ref 3.5–5.1)
Sodium: 135 mmol/L (ref 135–145)

## 2021-11-24 LAB — CBC WITH DIFFERENTIAL/PLATELET
Abs Immature Granulocytes: 0.03 10*3/uL (ref 0.00–0.07)
Basophils Absolute: 0 10*3/uL (ref 0.0–0.1)
Basophils Relative: 0 %
Eosinophils Absolute: 0.6 10*3/uL — ABNORMAL HIGH (ref 0.0–0.5)
Eosinophils Relative: 6 %
HCT: 29 % — ABNORMAL LOW (ref 36.0–46.0)
Hemoglobin: 9.2 g/dL — ABNORMAL LOW (ref 12.0–15.0)
Immature Granulocytes: 0 %
Lymphocytes Relative: 12 %
Lymphs Abs: 1.2 10*3/uL (ref 0.7–4.0)
MCH: 23.9 pg — ABNORMAL LOW (ref 26.0–34.0)
MCHC: 31.7 g/dL (ref 30.0–36.0)
MCV: 75.3 fL — ABNORMAL LOW (ref 80.0–100.0)
Monocytes Absolute: 1.1 10*3/uL — ABNORMAL HIGH (ref 0.1–1.0)
Monocytes Relative: 11 %
Neutro Abs: 7 10*3/uL (ref 1.7–7.7)
Neutrophils Relative %: 71 %
Platelets: 292 10*3/uL (ref 150–400)
RBC: 3.85 MIL/uL — ABNORMAL LOW (ref 3.87–5.11)
RDW: 14.1 % (ref 11.5–15.5)
WBC: 10 10*3/uL (ref 4.0–10.5)
nRBC: 0 % (ref 0.0–0.2)

## 2021-11-24 LAB — MAGNESIUM: Magnesium: 1.5 mg/dL — ABNORMAL LOW (ref 1.7–2.4)

## 2021-11-24 LAB — GLUCOSE, CAPILLARY
Glucose-Capillary: 197 mg/dL — ABNORMAL HIGH (ref 70–99)
Glucose-Capillary: 164 mg/dL — ABNORMAL HIGH (ref 70–99)
Glucose-Capillary: 170 mg/dL — ABNORMAL HIGH (ref 70–99)
Glucose-Capillary: 159 mg/dL — ABNORMAL HIGH (ref 70–99)

## 2021-11-24 MED ORDER — LANTUS SOLOSTAR 100 UNIT/ML ~~LOC~~ SOPN: 32.0000 [IU] | PEN_INJECTOR | Freq: Every day | SUBCUTANEOUS | Status: AC

## 2021-11-24 MED ORDER — POTASSIUM CHLORIDE CRYS ER 20 MEQ PO TBCR
40.0000 meq | EXTENDED_RELEASE_TABLET | Freq: Once | ORAL | Status: AC
  Administered 2021-11-24: 40 meq via ORAL
  Filled 2021-11-24: qty 2

## 2021-11-24 MED ORDER — LACTATED RINGERS IV BOLUS
500.0000 mL | Freq: Once | INTRAVENOUS | Status: AC
  Administered 2021-11-24: 500 mL via INTRAVENOUS

## 2021-11-24 MED ORDER — CEPHALEXIN 500 MG PO CAPS
500.0000 mg | ORAL_CAPSULE | Freq: Three times a day (TID) | ORAL | 0 refills | Status: DC
  Filled 2021-11-24: qty 21, 7d supply, fill #0

## 2021-11-24 MED ORDER — INSULIN ASPART 100 UNIT/ML FLEXPEN
PEN_INJECTOR | SUBCUTANEOUS | 0 refills | Status: DC
  Filled 2021-11-24: qty 12, 40d supply, fill #0
  Filled 2022-01-26: qty 3, 10d supply, fill #0

## 2021-11-24 MED ORDER — MAGNESIUM SULFATE 4 GM/100ML IV SOLN
4.0000 g | Freq: Once | INTRAVENOUS | Status: AC
  Administered 2021-11-24: 4 g via INTRAVENOUS
  Filled 2021-11-24: qty 100

## 2021-11-24 NOTE — TOC Transition Note (Signed)
Transition of Care [facility]) - CM/SW Discharge Note   Patient Details  Name: Laura Gentry MRN: [id number] Date of Birth: [birth year]  Transition of Care [facility]) CM/SW Contact:  Cyndi Bender, RN Phone Number: 11/24/2021, 11:10 AM   Clinical Narrative:     Patient stable for discharge. Notified Cory with [facility] of discharge. No other TOC needs at this time.  Final next level of care: [place] Barriers to Discharge: Barriers Resolved   Patient Goals and CMS Choice Patient states their goals for this hospitalization and ongoing recovery are:: return home CMS Medicare.gov Compare Post Acute Care list provided to:: Patient Choice offered to / list presented to : Patient  Discharge Placement                       Discharge Plan and Services   Discharge Planning Services: CM Consult Post Acute Care Choice: Home Health          DME Arranged: N/A         HH Arranged: PT, OT HH Agency: [facility] Date [place]: 11/22/21 Time [place]: 2671 Representative spoke with at [place]: Tommi Rumps  Social Determinants of Health ([place]) Interventions     Readmission Risk Interventions     No data to display

## 2021-11-24 NOTE — Progress Notes (Signed)
Physical Therapy Treatment Patient Details Name: Laura Gentry MRN: [id number] DOB: March 27, 1956 Today's Date: 11/24/2021   History of Present Illness 66 y/o female presented to ED on 11/21/21 after being found down. Recent hospitalization for L hip fx 6/11-6/16 and sepsis 2/2 UTI 6/6-6/10. Admitted acute metabolic encephalopathy. Asymptomatic orthostatic hypotension noted 7/3. PMH: cerebral aneurysm s/p clipping, diabetes, thyroid disease    PT Comments    Pt received reclined in chair, agreeable to therapy session and with good participation and tolerance for transfer, gait and stair instruction. Pt needing up to min guard and safety cues to perform all functional mobility tasks. Pt BP noted to be soft after mobility in hallway while in chair so full orthostatics taken and MD/RN notified of pt asymptomatic orthostatic hypotension. Pt encouraged to hydrate and would likely benefit from TED hose/LE compression to see if this improves hemodynamics. Pt continues to benefit from PT services to progress toward functional mobility goals.  Disposition below remains appropriate as long as pt has consistent supervision/assist during day and night time as pt with noted short term memory deficits and decreased safety awareness.  Recommendations for follow up therapy are one component of a multi-disciplinary discharge planning process, led by the attending physician.  Recommendations may be updated based on patient status, additional functional criteria and insurance authorization.  Follow Up Recommendations  Home health PT     Assistance Recommended at Discharge Frequent or constant Supervision/Assistance  Patient can return home with the following A little help with walking and/or transfers;A little help with bathing/dressing/bathroom;Assistance with cooking/housework;Help with stairs or ramp for entrance   Equipment Recommendations  None recommended by PT    Recommendations for Other Services        Precautions / Restrictions Precautions Precautions: Fall Precaution Comments: asx orthostatic hypotension Restrictions Weight Bearing Restrictions: Yes LUE Weight Bearing: Weight bearing as tolerated LLE Weight Bearing: Weight bearing as tolerated     Mobility  Bed Mobility               General bed mobility comments: received in chair    Transfers Overall transfer level: Needs assistance Equipment used: Rolling walker (2 wheels) Transfers: Sit to/from Stand Sit to Stand: Supervision           General transfer comment: min cueing for hand placement, pt with fair carryover after inital "plop" stand>sit in chair but had been cued during OT session 1 hr prior and did not recall.    Ambulation/Gait Ambulation/Gait assistance: Supervision Gait Distance (Feet): 225 Feet Assistive device: Rolling walker (2 wheels) Gait Pattern/deviations: Step-through pattern, Decreased stride length, Decreased stance time - left, Decreased dorsiflexion - left, Narrow base of support (noted bil hip IR (increased at LLE)) Gait velocity: Decreased Gait velocity interpretation: <1.31 ft/sec, indicative of household ambulator   General Gait Details: min cues for RW proximity with good carryover; No LOB noted, no c/o fatigue or dizziness   Stairs Stairs: Yes Stairs assistance: Supervision, Min guard Stair Management: Two rails, Step to pattern, Forwards Number of Stairs: 10 General stair comments: VC's for sequencing and general safety, no LOB or difficulty.      Balance Overall balance assessment: Needs assistance Sitting-balance support: No upper extremity supported, Feet supported Sitting balance-Leahy Scale: Good     Standing balance support: Bilateral upper extremity supported, No upper extremity supported, During functional activity Standing balance-Leahy Scale: Fair Standing balance comment: Relies on RW dynamically during mobility  Cognition Arousal/Alertness: Awake/alert Behavior During Therapy: WFL for tasks assessed/performed Overall Cognitive Status: No family/caregiver present to determine baseline cognitive functioning Area of Impairment: Problem solving, Awareness, Safety/judgement                         Safety/Judgement: Decreased awareness of safety Awareness: Emergent Problem Solving: Slow processing, Requires verbal cues General Comments: Pt present with decreased safety awareness (plopping into chair despite hx recent hip fx and recent cues by OT ~1 hr prior) but receptive to instruction on safer transfer technique with good carryover on inital teachback. She does talk about plan for dc, meals and meds with good support from family members (other then sister and mom, as her mom is in the hopsital). May need to confirm pt sister-in-law can stay with her?        Exercises      General Comments General comments (skin integrity, edema, etc.): BP 95/49 reclined in chair post-exertion, BP 68/45 standing, BP 84/52 seated in chair with feet on floor, BP 90/50 reclined back in chair. Pt denies dizziness, lightheadedness, significant fatigue in standing and no buckling during mobility just prior to orthostatics. RN/MD notified. HR WFL 60's-80 bpm with exertion/rest; SpO2 95-100% on RA resting and with exertion.      Pertinent Vitals/Pain Pain Assessment Pain Assessment: 0-10 Pain Score: 1  Pain Location: L hip Pain Descriptors / Indicators: Sore Pain Intervention(s): Monitored during session           PT Goals (current goals can now be found in the care plan section) Acute Rehab PT Goals Patient Stated Goal: to go home PT Goal Formulation: With patient Time For Goal Achievement: 12/06/21 Progress towards PT goals: Progressing toward goals    Frequency    Min 3X/week      PT Plan Current plan remains appropriate       AM-PAC PT "6 Clicks" Mobility   Outcome Measure  Help needed  turning from your back to your side while in a flat bed without using bedrails?: None Help needed moving from lying on your back to sitting on the side of a flat bed without using bedrails?: None Help needed moving to and from a bed to a chair (including a wheelchair)?: A Little Help needed standing up from a chair using your arms (e.g., wheelchair or bedside chair)?: A Little Help needed to walk in hospital room?: A Little Help needed climbing 3-5 steps with a railing? : A Little 6 Click Score: 20    End of Session Equipment Utilized During Treatment: Gait belt Activity Tolerance: Patient tolerated treatment well Patient left: in chair;with call bell/phone within reach;with chair alarm set;Other (comment) (reclined back a bit in chair for improved BP and encouraged pt to hydrate) Nurse Communication: Mobility status (orthostatic hypotension (asymptomatic)) PT Visit Diagnosis: Other abnormalities of gait and mobility (R26.89);Pain;Unsteadiness on feet (R26.81);History of falling (Z91.81) Pain - Right/Left: Left Pain - part of body: Leg;Hip     Time: 5035-4656 PT Time Calculation (min) (ACUTE ONLY): 34 min  Charges:  $Gait Training: 8-22 mins $Therapeutic Activity: 8-22 mins                     Sativa Gelles P., PTA Acute Rehabilitation Services Secure Chat Preferred 9a-5:30pm Office: [place] 11/24/2021, 11:39 AM

## 2021-11-24 NOTE — Discharge Instructions (Addendum)
Follow with Primary MD Seward Carol, MD in 7 days   Get CBC, CMP, magnesium, 2 view Chest X ray -  checked next visit within 1 week by Primary MD   Activity: As tolerated with Full fall precautions use walker/cane & assistance as needed  Disposition Home   Diet: Heart Healthy low carbohydrate, check your CBGs q. ACH S  Accuchecks 4 times/day, Once in AM empty stomach and then before each meal. Log in all results and show them to your Prim.MD in 3 days. If any glucose reading is under 80 or above 300 call your Prim MD immidiately. Follow Low glucose instructions for glucose under 80 as instructed.  Special Instructions: If you have smoked or chewed Tobacco  in the last 2 yrs please stop smoking, stop any regular Alcohol  and or any Recreational drug use.  On your next visit with your primary care physician please Get Medicines reviewed and adjusted.  Please request your Prim.MD to go over all Hospital Tests and Procedure/Radiological results at the follow up, please get all Hospital records sent to your Prim MD by signing hospital release before you go home.  If you experience worsening of your admission symptoms, develop shortness of breath, life threatening emergency, suicidal or homicidal thoughts you must seek medical attention immediately by calling 911 or calling your MD immediately  if symptoms less severe.  You Must read complete instructions/literature along with all the possible adverse reactions/side effects for all the Medicines you take and that have been prescribed to you. Take any new Medicines after you have completely understood and accpet all the possible adverse reactions/side effects.

## 2021-11-24 NOTE — Discharge Summary (Signed)
Laura Gentry OHF:[id number] DOB: Sep 12, 1955 DOA: 11/21/2021  PCP: Seward Carol, MD  Admit date: 11/21/2021  Discharge date: 11/25/2021  Admitted From: Home   Disposition:  Home   Recommendations for Outpatient Follow-up:   Follow up with PCP in 1-2 weeks  PCP Please obtain BMP/CBC, 2 view CXR in 1week,  (see Discharge instructions)   PCP Please follow up on the following pending results: Check CBC, CMP, magnesium in 5 to 7 days   Home Health: PT, OT if qualifies Equipment/Devices: None Consultations: None  Discharge Condition: Stable    CODE STATUS: Full    Diet Recommendation: Heart Healthy low carbohydrate    Chief Complaint  Patient presents with   Altered Mental Status     Brief history of present illness from the day of admission and additional interim summary    66 y.o.  female with history of DM2, HLD, HTN-who recently was hospitalized for sepsis due to obstructive renal stone-requiring renal stone placement-and then for a left hip fracture following a mechanical fall requiring surgical correction-presented to the hospital with fever and confusion.  See below for further details.    Significant events: 6/6-6/10>> hospitalization for sepsis due to complicated UTI in the setting of obstructive right ureteral stone-s/p cystoscopy with bilateral stent placement. 6/11-6/16>> hospitalization for left hip fracture-requiring surgical treatment. 6/28>> cystoscopy/lithotripsy/exchange of bilateral renal stents. 6/30>> admit to [facility] for fever/confusion-found to be in DKA-suspected sepsis due to recent urological instrumentation (was on oral antibiotics at home)   Significant studies: 6/30>> CT head: No acute intracranial abnormality 6/30>> CT C-spine: No fracture/subluxation 6/30>> CXR: No PNA 6/30>> CT  abdomen/pelvis:: Bilateral nonobstructing nephrolithiasis, enhancement of ureteral wall, small amount of gas within the urinary bladder   Significant microbiology data: 6/30>> COVID/influenza PCR: Negative 6/30>> blood culture: No growth   Procedures: 6/7>> cystoscopy with bilateral stent placement by Dr. Tresa Moore 6/13>> left hip cannulated screw placement by Dr Sammuel Hines 6/28>> cystoscopy-ureteroscopy-laser lithotripsy-exchange of bilateral stents.                                                                   Hospital Course   DKA: Resolved-likely in the setting of acute infection-Invokana use, she also missed couple of insulin dosages prior to hospital visit due to her weakness.  She has been counseled on compliance.  Invokana will be discontinued, she has been requested to check CBGs q. [facility] and follow-up with PCP within a week of discharge.  She has been on subcu insulin for the last 3 days without any issues.  Her Lantus dose admits increased and she has been given sliding scale insulin QA CHS as well.   Acute metabolic encephalopathy: Due to DKA/sepsis physiology-CT imaging negative-encephalopathy has resolved as she is completely awake and alert this morning   Mild  orthostatic hypotension.  Currently resolved after IV fluids, TED stockings and midodrine.  Low-dose midodrine upon discharge PCP to monitor.    Sepsis (POA) due to complicated UTI/pyelonephritis: Suspect related to recent urological instrumentation-was on oral antimicrobial therapy at home-hence cultures may be sterile.  Clinically improved-no further fever overnight-encephalopathy has resolved-continue IV antibiotics and follow culture data.  Spoke with urologist on-call-Dr. McKenzie-recommends that we treat her as if she has pyelonephritis for total of 7 to 10 days.   Recent left hip fracture: S/p left hip cannulated screw placement on 6/13-continue supportive care.  Reviewed discharge summary-recommendations were for ASA  x4 weeks for VTE prophylaxis.  Continue prophylactic Lovenox while hospitalized-can resume ASA on discharge.  Mobilize with PT.   HLD: Continue statin  HTN: BP soft, gentle IV fluids, beta-blocker dose reduced.  Monitor.   Hypokalemia/hypomagnesemia: Repleted, PCP to recheck.   DM-2 (A1c 8.8): CBG stable-transitioning to SQ insulin/SSI-follow and adjust.  Given she came in DKA-will need to discontinue Invokana on discharge. She did miss a few doses of insulin prior to her hospital admission likely putting her in DKA, she was counseled to be compliant with her medications take all diabetic medications except Invokana and follow-up with PCP within a week.  CBG (last 3)  Recent Labs    11/24/21 1632 11/24/21 2136 11/25/21 0828  GLUCAP 170* 159* 157*   Lab Results  Component Value Date   HGBA1C 8.8 (H) 10/29/2021     Discharge diagnosis     Principal Problem:   Acute metabolic encephalopathy Active Problems:   HLD (hyperlipidemia)   DKA (diabetic ketoacidosis) ([place])   S/p left hip fracture   Sepsis secondary to UTI [facility])    Discharge instructions    Discharge Instructions     Discharge instructions   Complete by: As directed    Follow with Primary MD Seward Carol, MD in 7 days   Get CBC, CMP, magnesium, 2 view Chest X ray -  checked next visit within 1 week by Primary MD   Activity: As tolerated with Full fall precautions use walker/cane & assistance as needed  Disposition Home   Diet: Heart Healthy low carbohydrate, check your CBGs q. [facility]  Accuchecks 4 times/day, Once in AM empty stomach and then before each meal. Log in all results and show them to your Prim.MD in 3 days. If any glucose reading is under 80 or above 300 call your Prim MD immidiately. Follow Low glucose instructions for glucose under 80 as instructed.  Special Instructions: If you have smoked or chewed Tobacco  in the last 2 yrs please stop smoking, stop any regular Alcohol  and or any  Recreational drug use.  On your next visit with your primary care physician please Get Medicines reviewed and adjusted.  Please request your Prim.MD to go over all Hospital Tests and Procedure/Radiological results at the follow up, please get all Hospital records sent to your Prim MD by signing hospital release before you go home.  If you experience worsening of your admission symptoms, develop shortness of breath, life threatening emergency, suicidal or homicidal thoughts you must seek medical attention immediately by calling 911 or calling your MD immediately  if symptoms less severe.  You Must read complete instructions/literature along with all the possible adverse reactions/side effects for all the Medicines you take and that have been prescribed to you. Take any new Medicines after you have completely understood and accpet all the possible adverse reactions/side effects.   Increase activity slowly  Complete by: As directed    No wound care   Complete by: As directed        Discharge Medications   Allergies as of 11/25/2021       Reactions   Amoxicillin Shortness Of Breath   Bee Venom Shortness Of Breath   Penicillins Anaphylaxis   Remote reaction Throat swelling - compromised airway Tolerates ceftriaxone   Pneumococcal Vaccines    Per patient due to Brain Aneurysm   Sulfa Antibiotics Hives, Itching, Swelling   Bactrim [sulfamethoxazole-trimethoprim]    Felt off balance        Medication List     STOP taking these medications    Invokamet XR 50-1000 MG Tb24 Generic drug: Canagliflozin-metFORMIN HCl ER   nitrofurantoin (macrocrystal-monohydrate) 100 MG capsule Commonly known as: Macrobid   oxyCODONE-acetaminophen 5-325 MG tablet Commonly known as: Percocet       TAKE these medications    Accu-Chek Aviva Plus w/Device Kit The patient IS insulin requiring, ICD 10 code E11.9. The patient tests 4 times per day.   accu-chek soft touch lancets Use as  instructed   acetaminophen 500 MG tablet Commonly known as: TYLENOL Take 1,000 mg by mouth every 6 (six) hours as needed for mild pain.   alendronate 70 MG tablet Commonly known as: FOSAMAX Take 70 mg by mouth once a week. Monday   aspirin 325 MG tablet Take 1 tablet (325 mg total) by mouth daily for 28 days.   atorvastatin 40 MG tablet Commonly known as: LIPITOR Take 1 tablet (40 mg total) by mouth daily.   cephALEXin 500 MG capsule Commonly known as: KEFLEX Take 1 capsule (500 mg total) by mouth 3 (three) times daily for 7 days.   DULoxetine 60 MG capsule Commonly known as: CYMBALTA Take 60 mg by mouth daily.   gabapentin 300 MG capsule Commonly known as: NEURONTIN Take 300 mg in AM, 300 mg at noon, 600 mg at bedtime What changed:  how much to take how to take this when to take this additional instructions   glucose blood test strip Commonly known as: Accu-Chek Aviva Plus Use as instructed. The patient IS insulin requiring, ICD 10 code E11.9. The patient tests 4 times per day.   Insulin Pen Needle 32G X 4 MM Misc Use to inject lantus once dialy   Lantus SoloStar 100 UNIT/ML Solostar Pen Generic drug: insulin glargine Inject 32 Units into the skin at bedtime. What changed: how much to take   midodrine 5 MG tablet Commonly known as: PROAMATINE Take 1 tablet (5 mg total) by mouth 2 (two) times daily with a meal.   NovoLOG FlexPen 100 UNIT/ML FlexPen Generic drug: insulin aspart Before each meal 3 times a day, 140-199 - 2 units, 200-250 - 4 units, 251-299 - 6 units,  300-349 - 8 units,  350 or above 10 units.   pantoprazole 40 MG tablet Commonly known as: Protonix Take 1 tablet (40 mg total) by mouth daily.   propranolol ER 120 MG 24 hr capsule Commonly known as: INDERAL LA Take 40 mg by mouth in the morning, at noon, and at bedtime.   traZODone 50 MG tablet Commonly known as: DESYREL Take 1 tablet (50 mg total) by mouth at bedtime as needed for sleep.    vitamin B-12 1000 MCG tablet Commonly known as: CYANOCOBALAMIN Take 1,000 mcg by mouth daily.   VITAMIN C PO Take 500 mg by mouth daily.         Follow-up Information  Care, [facility] Follow up.   Specialty: Home Health Services Contact information: [place] [address] [phone]         Seward Carol, MD. Schedule an appointment as soon as possible for a visit in 2 day(s).   Specialty: Internal Medicine Contact information: [address] [place] [address] [phone]                 Major procedures and Radiology Reports - PLEASE review detailed and final reports thoroughly  -       CT ABDOMEN PELVIS W CONTRAST  Result Date: 11/21/2021 CLINICAL DATA:  Abdominal pain, acute, nonlocalized. Patient was found on the floor. Altered mental status. EXAM: CT ABDOMEN AND PELVIS WITH CONTRAST TECHNIQUE: Multidetector CT imaging of the abdomen and pelvis was performed using the standard protocol following bolus administration of intravenous contrast. RADIATION DOSE REDUCTION: This exam was performed according to the departmental dose-optimization program which includes automated exposure control, adjustment of the mA and/or kV according to patient size and/or use of iterative reconstruction technique. CONTRAST:  74m OMNIPAQUE IOHEXOL 300 MG/ML  SOLN COMPARISON:  CT of the abdomen pelvis 10/28/2021 FINDINGS: Lower chest: Mild dependent atelectasis is present the left base. The heart size is normal. No significant pleural or pericardial effusion is present. Hepatobiliary: Mild diffuse fatty infiltration liver is present. Patient is status post cholecystectomy. The common bile duct is within normal limits. Pancreas: Unremarkable. No pancreatic ductal dilatation or surrounding inflammatory changes. Spleen: Normal in size without focal abnormality. Adrenals/Urinary Tract: Adrenal glands are normal bilaterally. Bilateral  nonobstructing calcifications are similar the prior study. The previously noted 5 mm stone in the midportion of the right kidney is no longer present. A nonobstructing 5 mm stone at the lower pole left kidney is present. There is some enhancement of the ureteral wall bilaterally. Bilateral renal simple cysts are present. Recommend a follow-up. Small amount of gas is present within the urinary bladder. Stomach/Bowel: The stomach and duodenum are within normal limits. Small bowel is unremarkable. Terminal ileum is within normal limits. Appendectomy noted. The ascending and transverse colon are within normal limits. Descending and sigmoid colon are unremarkable. Vascular/Lymphatic: Atherosclerotic calcifications are present the aorta branch vessels. No aneurysm is present. Reproductive: Status post hysterectomy. No adnexal masses. Other: No abdominal wall hernia or abnormality. No abdominopelvic ascites. Musculoskeletal: Disc disease again noted at L4-5 with central and foraminal stenosis. No focal osseous lesions are present. The bony pelvis is within normal limits. The hips are located and scratched at the hips are located bilaterally. Left proximal femur ORIF is again noted. IMPRESSION: 1. Bilateral nonobstructing nephrolithiasis. 2. Enhancement of the ureteral wall bilaterally raises concern for urinary tract infection. This may represent some residual inflammatory change from recent stents. 3. Small amount of gas within the urinary bladder. This may be related to recent instrumentation. This could be related to infection. 4. Hepatic steatosis. 5. Cholecystectomy, hysterectomy, and appendectomy. 6. Degenerative changes in the lower lumbar spine are stable. 7. Aortic Atherosclerosis (ICD10-I70.0). Electronically Signed   By: CSan MorelleM.D.   On: 11/21/2021 17:15   DG Chest 2 View  Result Date: 11/21/2021 CLINICAL DATA:  Altered mental status, diabetes EXAM: CHEST - 2 VIEW COMPARISON:  11/02/2021  FINDINGS: Normal heart size, mediastinal contours, and pulmonary vascularity. Atherosclerotic calcification aorta. Lungs clear. No pulmonary infiltrate, pleural effusion, or pneumothorax. Osseous demineralization. IMPRESSION: No acute abnormalities. Aortic Atherosclerosis (ICD10-I70.0). Electronically Signed   By: MElta Gu[place]  Thornton Papas M.D.   On: 11/21/2021 14:32   CT Cervical Spine Wo Contrast  Result Date: 11/21/2021 CLINICAL DATA:  Altered mental status.  Fall with pain. EXAM: CT HEAD WITHOUT CONTRAST CT CERVICAL SPINE WITHOUT CONTRAST TECHNIQUE: Multidetector CT imaging of the head and cervical spine was performed following the standard protocol without intravenous contrast. Multiplanar CT image reconstructions of the cervical spine were also generated. RADIATION DOSE REDUCTION: This exam was performed according to the departmental dose-optimization program which includes automated exposure control, adjustment of the mA and/or kV according to patient size and/or use of iterative reconstruction technique. COMPARISON:  11/02/2021. FINDINGS: CT HEAD FINDINGS Brain: No evidence of acute infarction, hemorrhage, hydrocephalus, extra-axial collection or mass lesion/mass effect. Stable aneurysm clip above the anterior right temporal lobe. Vascular: No hyperdense vessel or unexpected calcification. Skull: Previous right frontotemporal craniotomy. No fracture. No bone lesion. Sinuses/Orbits: Globes and orbits are unremarkable. Sinuses are clear. Other: None. CT CERVICAL SPINE FINDINGS Alignment: Normal. Skull base and vertebrae: No acute fracture. No primary bone lesion or focal pathologic process. Soft tissues and spinal canal: No prevertebral fluid or swelling. No visible canal hematoma. Disc levels: Disc spaces are well maintained. No evidence of a disc herniation or of significant disc bulging. No stenosis. Upper chest: Negative. Other: None. IMPRESSION: HEAD CT 1. No acute intracranial abnormalities. CERVICAL CT 1. Normal.  Electronically Signed   By: Lajean Manes M.D.   On: 11/21/2021 14:12   CT HEAD WO CONTRAST (5MM)  Result Date: 11/21/2021 CLINICAL DATA:  Altered mental status.  Fall with pain. EXAM: CT HEAD WITHOUT CONTRAST CT CERVICAL SPINE WITHOUT CONTRAST TECHNIQUE: Multidetector CT imaging of the head and cervical spine was performed following the standard protocol without intravenous contrast. Multiplanar CT image reconstructions of the cervical spine were also generated. RADIATION DOSE REDUCTION: This exam was performed according to the departmental dose-optimization program which includes automated exposure control, adjustment of the mA and/or kV according to patient size and/or use of iterative reconstruction technique. COMPARISON:  11/02/2021. FINDINGS: CT HEAD FINDINGS Brain: No evidence of acute infarction, hemorrhage, hydrocephalus, extra-axial collection or mass lesion/mass effect. Stable aneurysm clip above the anterior right temporal lobe. Vascular: No hyperdense vessel or unexpected calcification. Skull: Previous right frontotemporal craniotomy. No fracture. No bone lesion. Sinuses/Orbits: Globes and orbits are unremarkable. Sinuses are clear. Other: None. CT CERVICAL SPINE FINDINGS Alignment: Normal. Skull base and vertebrae: No acute fracture. No primary bone lesion or focal pathologic process. Soft tissues and spinal canal: No prevertebral fluid or swelling. No visible canal hematoma. Disc levels: Disc spaces are well maintained. No evidence of a disc herniation or of significant disc bulging. No stenosis. Upper chest: Negative. Other: None. IMPRESSION: HEAD CT 1. No acute intracranial abnormalities. CERVICAL CT 1. Normal. Electronically Signed   By: Lajean Manes M.D.   On: 11/21/2021 14:12   DG C-Arm 1-60 Min-No Report  Result Date: 11/19/2021 Fluoroscopy was utilized by the requesting physician.  No radiographic interpretation.   DG C-Arm 1-60 Min-No Report  Result Date: 11/19/2021 Fluoroscopy  was utilized by the requesting physician.  No radiographic interpretation.      Today   Subjective    Clarisa Danser today has no headache,no chest abdominal pain,no new weakness tingling or numbness, feels much better wants to go home today.     Objective   Blood pressure (!) 121/54, pulse 90, temperature 99.5 F (37.5 C), temperature source Oral, resp. rate 20, height 5' 7"  (1.702 m), weight 71 kg, SpO2 91 %.  Intake/Output Summary (Last 24 hours) at 11/25/2021 1132 Last data filed at 11/25/2021 0615 Gross per 24 hour  Intake 180 ml  Output 450 ml  Net -270 ml    Exam  Awake Alert, No new F.N deficits,    [place].AT,PERRAL Supple Neck,   Symmetrical Chest wall movement, Good air movement bilaterally, CTAB RRR,No Gallops,   +ve B.Sounds, Abd Soft, Non tender,  No Cyanosis, Clubbing or edema    Data Review   Recent Labs  Lab 11/21/21 1310 11/21/21 1345 11/21/21 1538 11/23/21 0225 11/23/21 0710 11/24/21 0048 11/25/21 0114  WBC 8.7  --   --  9.0 11.2* 10.0 11.2*  HGB 11.6*   < > 9.9* 8.5* 10.2* 9.2* 8.7*  HCT 38.3   < > 29.0* 26.7* 33.0* 29.0* 28.3*  PLT 300  --   --  241 307 292 317  MCV 82.4  --   --  77.2* 77.6* 75.3* 76.5*  MCH 24.9*  --   --  24.6* 24.0* 23.9* 23.5*  MCHC 30.3  --   --  31.8 30.9 31.7 30.7  RDW 14.2  --   --  14.4 14.5 14.1 14.2  LYMPHSABS 0.7  --   --   --   --  1.2 1.5  MONOABS 0.6  --   --   --   --  1.1* 1.0  EOSABS 0.0  --   --   --   --  0.6* 0.7*  BASOSABS 0.0  --   --   --   --  0.0 0.0   < > = values in this interval not displayed.    Recent Labs  Lab 11/21/21 1310 11/21/21 1325 11/21/21 1345 11/21/21 2125 11/22/21 0143 11/22/21 0416 11/23/21 0225 11/24/21 0048 11/25/21 0114  NA 132*  --    < > 133* 136 137 135 135 137  K 4.0  --    < > 3.0* 2.9* 3.3* 3.2* 3.5 3.5  CL 95*  --    < > 100 99 102 105 100 99  CO2 16*  --    < > 24 23 25 24 25 26   GLUCOSE 303*  --    < > 155* 198* 168* 145* 185* 149*  BUN 16  --    < > 12 11  10 9  6* 6*  CREATININE 0.96  --    < > 0.74 0.73 0.75 0.97 0.79 0.92  CALCIUM 9.1  --    < > 8.4* 8.8* 8.7* 7.9* 8.1* 8.2*  AST 17  --   --   --   --   --   --   --   --   ALT 14  --   --   --   --   --   --   --   --   ALKPHOS 104  --   --   --   --   --   --   --   --   BILITOT 1.1  --   --   --   --   --   --   --   --   ALBUMIN 3.0*  --   --   --   --   --   --   --   --   MG  --   --   --  1.4*  --   --  2.3 1.5* 1.9  LATICACIDVEN 1.7  --   --   --   --   --   --   --   --  INR  --  1.1  --   --   --   --   --   --   --    < > = values in this interval not displayed.    Total Time in preparing paper work, data evaluation and todays exam - 63 minutes  Lala Lund M.D on 11/25/2021 at 11:32 AM  [facility]

## 2021-11-24 NOTE — Progress Notes (Signed)
Occupational Therapy Treatment Patient Details Name: Laura Gentry MRN: [id number] DOB: 1955/12/06 Today's Date: 11/24/2021   History of present illness 66 y/o female presented to ED on 11/21/21 after being found down. Recent hospitalization for L hip fx 6/11-6/16 and sepsis 2/2 UTI 6/6-6/10. Admitted acute metabolic encephalopathy. PMH: cerebral aneurysm s/p clipping, diabetes, thyroid disease   OT comments  Patient pleasant and eager to participate in OT this morning. She demonstrates ability to complete transfers and mobility using RW with min guard to close supervision, intermittent cueing for hand placement/safety. She completes ADLs with supervision, but requires some cueing for awareness and hygiene.  She does have good plan for meals and assistance at home (mentions several family members who can help, bring meals, stay for a few days since her mom is in the hospital and her sister is staying with her mom). Encouraged initial increased support at home and pt agreeable.  Scored 2/28 (normal) on short blessed test.  Continue to recommend HHOT at dc.  Will follow acutely.    Recommendations for follow up therapy are one component of a multi-disciplinary discharge planning process, led by the attending physician.  Recommendations may be updated based on patient status, additional functional criteria and insurance authorization.    Follow Up Recommendations  Home health OT    Assistance Recommended at Discharge Intermittent Supervision/Assistance  Patient can return home with the following  A little help with walking and/or transfers;A little help with bathing/dressing/bathroom;Assistance with cooking/housework;Assist for transportation;Help with stairs or ramp for entrance   Equipment Recommendations  None recommended by OT;Other (comment)    Recommendations for Other Services      Precautions / Restrictions Precautions Precautions: Fall Restrictions Weight Bearing Restrictions:  Yes LUE Weight Bearing: Weight bearing as tolerated LLE Weight Bearing: Weight bearing as tolerated       Mobility Bed Mobility Overal bed mobility: Modified Independent                  Transfers Overall transfer level: Needs assistance Equipment used: Rolling walker (2 wheels) Transfers: Sit to/from Stand Sit to Stand: Supervision           General transfer comment: min cueing for hand placement, transferred from bed and commode without physical assist     Balance Overall balance assessment: Needs assistance Sitting-balance support: No upper extremity supported, Feet supported Sitting balance-Leahy Scale: Good     Standing balance support: Bilateral upper extremity supported, No upper extremity supported, During functional activity Standing balance-Leahy Scale: Fair Standing balance comment: relies on RW dynamically during mobility but able to stand during ADLs with close supervision and 1-0 hand support                           ADL either performed or assessed with clinical judgement   ADL Overall ADL's : Needs assistance/impaired     Grooming: Wash/dry hands;Supervision/safety;Standing       Lower Body Bathing: Supervison/ safety;Sit to/from stand       Lower Body Dressing: Supervision/safety;Sit to/from stand Lower Body Dressing Details (indicate cue type and reason): reports using AE at home, but able to pull up socks and simulate dressing with supervision Toilet Transfer: Supervision/safety;Ambulation;Rolling walker (2 wheels)   Toileting- Clothing Manipulation and Hygiene: Supervision/safety;Sit to/from stand Toileting - Clothing Manipulation Details (indicate cue type and reason): cueing for hygiene but able to complete without physical assist     Functional mobility during ADLs: Min guard;Supervision/safety;Rolling walker (2  wheels) General ADL Comments: min guard initally fading to supervision, min cueing for safety with RW but  able to self correct before leaving RW at sink    Extremity/Trunk Assessment              Vision       Perception     Praxis      Cognition Arousal/Alertness: Awake/alert Behavior During Therapy: WFL for tasks assessed/performed Overall Cognitive Status: No family/caregiver present to determine baseline cognitive functioning Area of Impairment: Problem solving, Awareness, Safety/judgement                         Safety/Judgement: Decreased awareness of safety Awareness: Emergent Problem Solving: Slow processing, Requires verbal cues General Comments: patinet scoring 2/28 on short blessed test, demonstrating normal attention, sequencing and recall.  She does present with decreased safety, but self corrects when initally forgetting RW x 1, and decreased awareness with hygiene (having cueing to ensure cleanliness after BM prior to session start). She does talk about plan for dc, meals and meds with good support from family members (other then sister and mom, as her mom is in the hopsital)        Exercises      Shoulder Instructions       General Comments VSS on RA    Pertinent Vitals/ Pain       Pain Assessment Pain Assessment: No/denies pain  Home Living                                          Prior Functioning/Environment              Frequency  Min 2X/week        Progress Toward Goals  OT Goals(current goals can now be found in the care plan section)  Progress towards OT goals: Progressing toward goals  Acute Rehab OT Goals Patient Stated Goal: get better, get home OT Goal Formulation: With patient Time For Goal Achievement: 12/06/21 Potential to Achieve Goals: Good  Plan Discharge plan remains appropriate;Frequency remains appropriate    Co-evaluation                 AM-PAC OT "6 Clicks" Daily Activity     Outcome Measure   Help from another person eating meals?: None Help from another person taking  care of personal grooming?: A Little Help from another person toileting, which includes using toliet, bedpan, or urinal?: A Little Help from another person bathing (including washing, rinsing, drying)?: A Little Help from another person to put on and taking off regular upper body clothing?: A Little Help from another person to put on and taking off regular lower body clothing?: A Little 6 Click Score: 19    End of Session Equipment Utilized During Treatment: Rolling walker (2 wheels)  OT Visit Diagnosis: Unsteadiness on feet (R26.81);Other abnormalities of gait and mobility (R26.89);Muscle weakness (generalized) (M62.81);Other symptoms and signs involving cognitive function   Activity Tolerance Patient tolerated treatment well   Patient Left in chair;with call bell/phone within reach;with chair alarm set   Nurse Communication Mobility status        Time: [phone] OT Time Calculation (min): 28 min  Charges: OT General Charges $OT Visit: 1 Visit OT Treatments $Self Care/Home Management : 23-37 mins  [place] Office [phone]   Anner Crete  Miliani Deike 11/24/2021, 10:16 AM

## 2021-11-25 LAB — CBC WITH DIFFERENTIAL/PLATELET
Abs Immature Granulocytes: 0.09 10*3/uL — ABNORMAL HIGH (ref 0.00–0.07)
Basophils Absolute: 0 10*3/uL (ref 0.0–0.1)
Basophils Relative: 0 %
Eosinophils Absolute: 0.7 10*3/uL — ABNORMAL HIGH (ref 0.0–0.5)
Eosinophils Relative: 6 %
HCT: 28.3 % — ABNORMAL LOW (ref 36.0–46.0)
Hemoglobin: 8.7 g/dL — ABNORMAL LOW (ref 12.0–15.0)
Immature Granulocytes: 1 %
Lymphocytes Relative: 14 %
Lymphs Abs: 1.5 10*3/uL (ref 0.7–4.0)
MCH: 23.5 pg — ABNORMAL LOW (ref 26.0–34.0)
MCHC: 30.7 g/dL (ref 30.0–36.0)
MCV: 76.5 fL — ABNORMAL LOW (ref 80.0–100.0)
Monocytes Absolute: 1 10*3/uL (ref 0.1–1.0)
Monocytes Relative: 9 %
Neutro Abs: 7.9 10*3/uL — ABNORMAL HIGH (ref 1.7–7.7)
Neutrophils Relative %: 70 %
Platelets: 317 10*3/uL (ref 150–400)
RBC: 3.7 MIL/uL — ABNORMAL LOW (ref 3.87–5.11)
RDW: 14.2 % (ref 11.5–15.5)
WBC: 11.2 10*3/uL — ABNORMAL HIGH (ref 4.0–10.5)
nRBC: 0 % (ref 0.0–0.2)

## 2021-11-25 LAB — BASIC METABOLIC PANEL
Anion gap: 12 (ref 5–15)
BUN: 6 mg/dL — ABNORMAL LOW (ref 8–23)
CO2: 26 mmol/L (ref 22–32)
Calcium: 8.2 mg/dL — ABNORMAL LOW (ref 8.9–10.3)
Chloride: 99 mmol/L (ref 98–111)
Creatinine, Ser: 0.92 mg/dL (ref 0.44–1.00)
GFR, Estimated: >60 mL/min (ref >60)
Glucose, Bld: 149 mg/dL — ABNORMAL HIGH (ref 70–99)
Potassium: 3.5 mmol/L (ref 3.5–5.1)
Sodium: 137 mmol/L (ref 135–145)

## 2021-11-25 LAB — GLUCOSE, CAPILLARY
Glucose-Capillary: 157 mg/dL — ABNORMAL HIGH (ref 70–99)
Glucose-Capillary: 158 mg/dL — ABNORMAL HIGH (ref 70–99)

## 2021-11-25 LAB — MAGNESIUM: Magnesium: 1.9 mg/dL (ref 1.7–2.4)

## 2021-11-25 MED ORDER — LACTATED RINGERS IV BOLUS
1000.0000 mL | Freq: Once | INTRAVENOUS | Status: AC
  Administered 2021-11-25: 1000 mL via INTRAVENOUS

## 2021-11-25 MED ORDER — MIDODRINE HCL 5 MG PO TABS: 5.0000 mg | ORAL_TABLET | Freq: Two times a day (BID) | ORAL | 0 refills | Status: DC

## 2021-11-25 MED ORDER — PROPRANOLOL HCL ER 60 MG PO CP24
60.0000 mg | ORAL_CAPSULE | Freq: Every day | ORAL | Status: DC
  Filled 2021-11-25: qty 1

## 2021-11-25 MED ORDER — INSULIN GLARGINE-YFGN 100 UNIT/ML ~~LOC~~ SOLN
32.0000 [IU] | Freq: Every day | SUBCUTANEOUS | Status: DC
  Administered 2021-11-25: 32 [IU] via SUBCUTANEOUS
  Filled 2021-11-25: qty 0.32

## 2021-11-25 MED ORDER — MIDODRINE HCL 5 MG PO TABS
5.0000 mg | ORAL_TABLET | Freq: Three times a day (TID) | ORAL | Status: DC
  Administered 2021-11-25 (×2): 5 mg via ORAL
  Filled 2021-11-25 (×2): qty 1

## 2021-11-25 NOTE — Plan of Care (Signed)

## 2021-11-25 NOTE — Progress Notes (Addendum)
Occupational Therapy Treatment Patient Details Name: Laura Gentry MRN: [id number] DOB: [birth year] Today's Date: 11/25/2021   History of present illness 66 y/o female presented to ED on 11/21/21 after being found down. Recent hospitalization for L hip fx 6/11-6/16 and sepsis 2/2 UTI 6/6-6/10. Admitted acute metabolic encephalopathy. Asymptomatic orthostatic hypotension noted 7/3. PMH: cerebral aneurysm s/p clipping, diabetes, thyroid disease   OT comments  Patient seated in recliner upon entry, agreeable to OT session.  Completing LB dressing with supervision and transfers with supervision using RW, good recall of hand placement and safety.  Limited session due to symptomatic with orthostatic hypotension upon standing, reports lightheaded and "feeling off".  Discussed safety with hypotension for home, pt verbalized understanding.  Pt agreeable to increased assist at home, reports she spoke to her sister in law who plans to stay with her initially. Will follow acutely.   BP seated: 121/61 (79)  Standing: 96/65 (76) Stand x 3 minutes: 96/46 (58) Sitting: 126/56 (78)   Recommendations for follow up therapy are one component of a multi-disciplinary discharge planning process, led by the attending physician.  Recommendations may be updated based on patient status, additional functional criteria and insurance authorization.    Follow Up Recommendations  Home health OT    Assistance Recommended at Discharge Frequent or constant Supervision/Assistance  Patient can return home with the following  A little help with walking and/or transfers;A little help with bathing/dressing/bathroom;Assistance with cooking/housework;Assist for transportation;Help with stairs or ramp for entrance   Equipment Recommendations  None recommended by OT;Other (comment)    Recommendations for Other Services PT consult    Precautions / Restrictions Precautions Precautions: Fall Precaution Comments: orthostatic  hypotension Restrictions LUE Weight Bearing: Weight bearing as tolerated LLE Weight Bearing: Weight bearing as tolerated       Mobility Bed Mobility               General bed mobility comments: OOB in recliner    Transfers Overall transfer level: Needs assistance Equipment used: Rolling walker (2 wheels) Transfers: Sit to/from Stand Sit to Stand: Supervision           General transfer comment: good recall of hand placement     Balance Overall balance assessment: Needs assistance Sitting-balance support: No upper extremity supported, Feet supported Sitting balance-Leahy Scale: Good     Standing balance support: Bilateral upper extremity supported, During functional activity Standing balance-Leahy Scale: Fair Standing balance comment: Relies on RW dynamically                           ADL either performed or assessed with clinical judgement   ADL Overall ADL's : Needs assistance/impaired                     Lower Body Dressing: Supervision/safety;Sit to/from stand Lower Body Dressing Details (indicate cue type and reason): without AE             Functional mobility during ADLs: Supervision/safety;Rolling walker (2 wheels) General ADL Comments: limited by orthostatic hypotension in standing, pt symptomatic today.  discussed safety recommendations with positional changes, awareness of symptoms and knowing where she can sit, as well has having increased support at home.    Extremity/Trunk Assessment              Vision       Perception     Praxis      Cognition Arousal/Alertness: Awake/alert Behavior During Therapy: WFL for  tasks assessed/performed Overall Cognitive Status: No family/caregiver present to determine baseline cognitive functioning Area of Impairment: Problem solving, Awareness, Safety/judgement                         Safety/Judgement: Decreased awareness of safety Awareness: Emergent Problem  Solving: Slow processing, Requires verbal cues General Comments: pt demonstrating improving awareness and safety, good recall of hand placement and understanding of precautions due to orthostatic hypotension. reports plan to have her sister in law stay with her 24/7 initally        Exercises      Shoulder Instructions       General Comments BP assessed, orthostatic in standing with pt reporting lightheaded and "feeling off"    Pertinent Vitals/ Pain       Pain Assessment Pain Assessment: No/denies pain  Home Living                                          Prior Functioning/Environment              Frequency  Min 2X/week        Progress Toward Goals  OT Goals(current goals can now be found in the care plan section)  Progress towards OT goals: Progressing toward goals  Acute Rehab OT Goals Patient Stated Goal: to get home OT Goal Formulation: With patient Time For Goal Achievement: 12/06/21 Potential to Achieve Goals: Good  Plan Discharge plan remains appropriate;Frequency remains appropriate    Co-evaluation                 AM-PAC OT "6 Clicks" Daily Activity     Outcome Measure   Help from another person eating meals?: None Help from another person taking care of personal grooming?: A Little Help from another person toileting, which includes using toliet, bedpan, or urinal?: A Little Help from another person bathing (including washing, rinsing, drying)?: A Little Help from another person to put on and taking off regular upper body clothing?: A Little Help from another person to put on and taking off regular lower body clothing?: A Little 6 Click Score: 19    End of Session Equipment Utilized During Treatment: Rolling walker (2 wheels)  OT Visit Diagnosis: Unsteadiness on feet (R26.81);Other abnormalities of gait and mobility (R26.89);Muscle weakness (generalized) (M62.81);Other symptoms and signs involving cognitive function    Activity Tolerance Treatment limited secondary to medical complications (Comment)   Patient Left in chair;with call bell/phone within reach;with chair alarm set   Nurse Communication Mobility status        Time: [id number] OT Time Calculation (min): 24 min  Charges: OT General Charges $OT Visit: 1 Visit OT Treatments $Self Care/Home Management : 23-37 mins  [place] Office [phone]   Delight Stare 11/25/2021, 11:08 AM

## 2021-11-25 NOTE — Plan of Care (Signed)
  Problem: Education: Goal: Knowledge of General Education information will improve Description: Including pain rating scale, medication(s)/side effects and non-pharmacologic comfort measures Outcome: Adequate for Discharge   Problem: Health Behavior/Discharge Planning: Goal: Ability to manage health-related needs will improve Outcome: Adequate for Discharge   Problem: Clinical Measurements: Goal: Ability to maintain clinical measurements within normal limits will improve Outcome: Adequate for Discharge Goal: Will remain free from infection Outcome: Adequate for Discharge Goal: Diagnostic test results will improve Outcome: Adequate for Discharge Goal: Respiratory complications will improve Outcome: Adequate for Discharge Goal: Cardiovascular complication will be avoided Outcome: Adequate for Discharge   Problem: Nutrition: Goal: Adequate nutrition will be maintained Outcome: Adequate for Discharge   Problem: Activity: Goal: Risk for activity intolerance will decrease Outcome: Adequate for Discharge   Problem: Nutrition: Goal: Adequate nutrition will be maintained Outcome: Adequate for Discharge   Problem: Coping: Goal: Level of anxiety will decrease Outcome: Adequate for Discharge

## 2021-11-25 NOTE — Care Management Important Message (Signed)
Important Message  Patient Details  Name: Laura Gentry MRN: [id number] Date of Birth: 05-16-1956   Medicare Important Message Given:  Yes     Shelda Altes 11/25/2021, 12:21 PM

## 2021-11-26 ENCOUNTER — Emergency Department (HOSPITAL_COMMUNITY): Payer: Medicare HMO

## 2021-11-26 ENCOUNTER — Other Ambulatory Visit: Payer: Self-pay

## 2021-11-26 ENCOUNTER — Inpatient Hospital Stay (HOSPITAL_COMMUNITY)
Admission: EM | Admit: 2021-11-26 | Discharge: 2021-12-01 | DRG: 812 | Disposition: A | Discharge status: Skilled Nursing Facility | Payer: Medicare HMO | Attending: Internal Medicine | Admitting: Internal Medicine

## 2021-11-26 DIAGNOSIS — Z833 Family history of diabetes mellitus: Secondary | ICD-10-CM | POA: Diagnosis not present

## 2021-11-26 DIAGNOSIS — Z85828 Personal history of other malignant neoplasm of skin: Secondary | ICD-10-CM | POA: Diagnosis not present

## 2021-11-26 DIAGNOSIS — E119 Type 2 diabetes mellitus without complications: Secondary | ICD-10-CM | POA: Diagnosis not present

## 2021-11-26 DIAGNOSIS — Z794 Long term (current) use of insulin: Secondary | ICD-10-CM | POA: Diagnosis not present

## 2021-11-26 DIAGNOSIS — Z7983 Long term (current) use of bisphosphonates: Secondary | ICD-10-CM

## 2021-11-26 DIAGNOSIS — R42 Dizziness and giddiness: Secondary | ICD-10-CM

## 2021-11-26 DIAGNOSIS — D649 Anemia, unspecified: Principal | ICD-10-CM

## 2021-11-26 DIAGNOSIS — Z882 Allergy status to sulfonamides status: Secondary | ICD-10-CM

## 2021-11-26 DIAGNOSIS — D62 Acute posthemorrhagic anemia: Secondary | ICD-10-CM | POA: Diagnosis present

## 2021-11-26 DIAGNOSIS — Z825 Family history of asthma and other chronic lower respiratory diseases: Secondary | ICD-10-CM

## 2021-11-26 DIAGNOSIS — E860 Dehydration: Secondary | ICD-10-CM | POA: Diagnosis present

## 2021-11-26 DIAGNOSIS — Z87891 Personal history of nicotine dependence: Secondary | ICD-10-CM | POA: Diagnosis not present

## 2021-11-26 DIAGNOSIS — Z7982 Long term (current) use of aspirin: Secondary | ICD-10-CM | POA: Diagnosis not present

## 2021-11-26 DIAGNOSIS — N136 Pyonephrosis: Secondary | ICD-10-CM | POA: Diagnosis present

## 2021-11-26 DIAGNOSIS — Z800 Family history of malignant neoplasm of digestive organs: Secondary | ICD-10-CM

## 2021-11-26 DIAGNOSIS — G250 Essential tremor: Secondary | ICD-10-CM | POA: Diagnosis not present

## 2021-11-26 DIAGNOSIS — Z043 Encounter for examination and observation following other accident: Secondary | ICD-10-CM | POA: Diagnosis not present

## 2021-11-26 DIAGNOSIS — M810 Age-related osteoporosis without current pathological fracture: Secondary | ICD-10-CM | POA: Diagnosis present

## 2021-11-26 DIAGNOSIS — Z801 Family history of malignant neoplasm of trachea, bronchus and lung: Secondary | ICD-10-CM | POA: Diagnosis not present

## 2021-11-26 DIAGNOSIS — R000 Tachycardia, unspecified: Secondary | ICD-10-CM | POA: Diagnosis not present

## 2021-11-26 DIAGNOSIS — E876 Hypokalemia: Secondary | ICD-10-CM | POA: Diagnosis not present

## 2021-11-26 DIAGNOSIS — R4182 Altered mental status, unspecified: Secondary | ICD-10-CM | POA: Diagnosis not present

## 2021-11-26 DIAGNOSIS — E039 Hypothyroidism, unspecified: Secondary | ICD-10-CM | POA: Diagnosis present

## 2021-11-26 DIAGNOSIS — I951 Orthostatic hypotension: Secondary | ICD-10-CM

## 2021-11-26 DIAGNOSIS — Z808 Family history of malignant neoplasm of other organs or systems: Secondary | ICD-10-CM | POA: Diagnosis not present

## 2021-11-26 DIAGNOSIS — I69828 Other speech and language deficits following other cerebrovascular disease: Secondary | ICD-10-CM | POA: Diagnosis not present

## 2021-11-26 DIAGNOSIS — Z7401 Bed confinement status: Secondary | ICD-10-CM | POA: Diagnosis not present

## 2021-11-26 DIAGNOSIS — E7800 Pure hypercholesterolemia, unspecified: Secondary | ICD-10-CM | POA: Diagnosis present

## 2021-11-26 DIAGNOSIS — R2689 Other abnormalities of gait and mobility: Secondary | ICD-10-CM | POA: Diagnosis not present

## 2021-11-26 DIAGNOSIS — Z8249 Family history of ischemic heart disease and other diseases of the circulatory system: Secondary | ICD-10-CM

## 2021-11-26 DIAGNOSIS — W19XXXD Unspecified fall, subsequent encounter: Secondary | ICD-10-CM | POA: Diagnosis present

## 2021-11-26 DIAGNOSIS — Z79899 Other long term (current) drug therapy: Secondary | ICD-10-CM

## 2021-11-26 DIAGNOSIS — I471 Supraventricular tachycardia: Secondary | ICD-10-CM | POA: Diagnosis present

## 2021-11-26 DIAGNOSIS — N390 Urinary tract infection, site not specified: Secondary | ICD-10-CM | POA: Diagnosis present

## 2021-11-26 DIAGNOSIS — Z880 Allergy status to penicillin: Secondary | ICD-10-CM

## 2021-11-26 DIAGNOSIS — R0902 Hypoxemia: Secondary | ICD-10-CM | POA: Diagnosis not present

## 2021-11-26 DIAGNOSIS — Z887 Allergy status to serum and vaccine status: Secondary | ICD-10-CM

## 2021-11-26 DIAGNOSIS — S72032D Displaced midcervical fracture of left femur, subsequent encounter for closed fracture with routine healing: Secondary | ICD-10-CM

## 2021-11-26 DIAGNOSIS — R41841 Cognitive communication deficit: Secondary | ICD-10-CM | POA: Diagnosis not present

## 2021-11-26 DIAGNOSIS — B9689 Other specified bacterial agents as the cause of diseases classified elsewhere: Secondary | ICD-10-CM | POA: Diagnosis not present

## 2021-11-26 DIAGNOSIS — W19XXXA Unspecified fall, initial encounter: Secondary | ICD-10-CM | POA: Diagnosis not present

## 2021-11-26 DIAGNOSIS — Z91030 Bee allergy status: Secondary | ICD-10-CM

## 2021-11-26 DIAGNOSIS — I959 Hypotension, unspecified: Secondary | ICD-10-CM | POA: Diagnosis not present

## 2021-11-26 DIAGNOSIS — Z803 Family history of malignant neoplasm of breast: Secondary | ICD-10-CM

## 2021-11-26 DIAGNOSIS — M6281 Muscle weakness (generalized): Secondary | ICD-10-CM | POA: Diagnosis not present

## 2021-11-26 DIAGNOSIS — R1312 Dysphagia, oropharyngeal phase: Secondary | ICD-10-CM | POA: Diagnosis not present

## 2021-11-26 LAB — URINALYSIS, ROUTINE W REFLEX MICROSCOPIC
Bilirubin Urine: NEGATIVE
Glucose, UA: 50 mg/dL — AB
Hgb urine dipstick: NEGATIVE
Ketones, ur: 20 mg/dL — AB
Nitrite: NEGATIVE
Protein, ur: 30 mg/dL — AB
Specific Gravity, Urine: 1.01 (ref 1.005–1.030)
pH: 6 (ref 5.0–8.0)

## 2021-11-26 LAB — PREPARE RBC (CROSSMATCH)

## 2021-11-26 LAB — CBC WITH DIFFERENTIAL/PLATELET
Abs Immature Granulocytes: 0.17 10*3/uL — ABNORMAL HIGH (ref 0.00–0.07)
Basophils Absolute: 0 10*3/uL (ref 0.0–0.1)
Basophils Relative: 0 %
Eosinophils Absolute: 0.3 10*3/uL (ref 0.0–0.5)
Eosinophils Relative: 3 %
HCT: 26.2 % — ABNORMAL LOW (ref 36.0–46.0)
Hemoglobin: 7.9 g/dL — ABNORMAL LOW (ref 12.0–15.0)
Immature Granulocytes: 2 %
Lymphocytes Relative: 14 %
Lymphs Abs: 1.7 10*3/uL (ref 0.7–4.0)
MCH: 23.7 pg — ABNORMAL LOW (ref 26.0–34.0)
MCHC: 30.2 g/dL (ref 30.0–36.0)
MCV: 78.4 fL — ABNORMAL LOW (ref 80.0–100.0)
Monocytes Absolute: 0.8 10*3/uL (ref 0.1–1.0)
Monocytes Relative: 7 %
Neutro Abs: 8.5 10*3/uL — ABNORMAL HIGH (ref 1.7–7.7)
Neutrophils Relative %: 74 %
Platelets: 370 10*3/uL (ref 150–400)
RBC: 3.34 MIL/uL — ABNORMAL LOW (ref 3.87–5.11)
RDW: 14.2 % (ref 11.5–15.5)
WBC: 11.5 10*3/uL — ABNORMAL HIGH (ref 4.0–10.5)
nRBC: 0 % (ref 0.0–0.2)

## 2021-11-26 LAB — COMPREHENSIVE METABOLIC PANEL
ALT: 26 U/L (ref 0–44)
AST: 17 U/L (ref 15–41)
Albumin: 1.9 g/dL — ABNORMAL LOW (ref 3.5–5.0)
Alkaline Phosphatase: 106 U/L (ref 38–126)
Anion gap: 14 (ref 5–15)
BUN: 8 mg/dL (ref 8–23)
CO2: 23 mmol/L (ref 22–32)
Calcium: 7.9 mg/dL — ABNORMAL LOW (ref 8.9–10.3)
Chloride: 101 mmol/L (ref 98–111)
Creatinine, Ser: 0.75 mg/dL (ref 0.44–1.00)
GFR, Estimated: >60 mL/min (ref >60)
Glucose, Bld: 220 mg/dL — ABNORMAL HIGH (ref 70–99)
Potassium: 3.8 mmol/L (ref 3.5–5.1)
Sodium: 138 mmol/L (ref 135–145)
Total Bilirubin: 0.8 mg/dL (ref 0.3–1.2)
Total Protein: 5.6 g/dL — ABNORMAL LOW (ref 6.5–8.1)

## 2021-11-26 LAB — POC OCCULT BLOOD, ED: Fecal Occult Bld: NEGATIVE

## 2021-11-26 LAB — MAGNESIUM: Magnesium: 1.6 mg/dL — ABNORMAL LOW (ref 1.7–2.4)

## 2021-11-26 LAB — CULTURE, BLOOD (ROUTINE X 2)
Culture: NO GROWTH
Culture: NO GROWTH
Special Requests: ADEQUATE
Special Requests: ADEQUATE

## 2021-11-26 MED ORDER — SODIUM CHLORIDE 0.9 % IV BOLUS
1000.0000 mL | Freq: Once | INTRAVENOUS | Status: AC
  Administered 2021-11-26: 1000 mL via INTRAVENOUS

## 2021-11-26 MED ORDER — SODIUM CHLORIDE 0.9 % IV SOLN: 10.0000 mL/h | Freq: Once | INTRAVENOUS | Status: DC

## 2021-11-26 MED ORDER — ONDANSETRON HCL 4 MG/2ML IJ SOLN
4.0000 mg | Freq: Once | INTRAMUSCULAR | Status: AC
  Administered 2021-11-26: 4 mg via INTRAVENOUS
  Filled 2021-11-26: qty 2

## 2021-11-26 NOTE — ED Triage Notes (Signed)
BIB [facility] from home due to dizziness and a fall. D/c yesterday for hypotensive. EMS arrival 90/p hr 180. No PO intake today, failure to thrive, lives alone, vomit episode enroute. A&Ox4. NS 752m bolus, Adenisone '6mg'$  100hr, IV 22ga lt wrist Zofran '4mg'$ 

## 2021-11-26 NOTE — ED Notes (Signed)
Pt not in room at this time

## 2021-11-26 NOTE — ED Provider Notes (Signed)
[facility] EMERGENCY DEPARTMENT Provider Note   CSN: [id number] Arrival date & time: 11/26/21  6389     History  Chief Complaint  Patient presents with   Dizziness   Fall    Laura Gentry is a 66 y.o. female.  Pt is a 66 yo female with a pmhx significant for DM, hypothyroidism, brain aneurysm, high cholesterol, skin cancer, kidney stones, and osteoporosis.  Pt was admitted from 6/6-6/10 for sepsis from a uti with a stone.  She was back on 6/11 for a fall with left hip fx which required surgical tx.  She had her renal stents exchanged on 6/28.  She was admitted from 6/30-7/4 for AMS from dka and sepsis thought to be due to urological instrumentation.  Pt said she has been unable to get up at home.  She has been very dizzy when she stands up.  She has not eaten or drank anything since d/c because she could not get up.  She called EMS.  When EMS arrived, she was hypotensive with bp 90s.  HR in the 160s.  EMS did not leave strips, but report SVT which responded to 6 mg adenosine.  Pt was also given 4 mg zofran en route and 700 cc ivfs.        Home Medications Prior to Admission medications   Medication Sig Start Date End Date Taking? Authorizing Provider  acetaminophen (TYLENOL) 500 MG tablet Take 1,000 mg by mouth every 6 (six) hours as needed for mild pain or headache.   Yes [provider]  alendronate (FOSAMAX) 70 MG tablet Take 70 mg by mouth every Monday. 09/27/21  Yes [provider]  aspirin 325 MG tablet Take 1 tablet (325 mg total) by mouth daily for 28 days. 11/08/21 12/06/21 Yes Dahal, Marlowe Aschoff, MD  atorvastatin (LIPITOR) 40 MG tablet Take 1 tablet (40 mg total) by mouth daily. 01/31/18  Yes Lorella Nimrod, MD  DULoxetine (CYMBALTA) 60 MG capsule Take 60 mg by mouth daily.   Yes [provider]  gabapentin (NEURONTIN) 300 MG capsule Take 300 mg in AM, 300 mg at noon, 600 mg at bedtime Patient taking differently: Take 300-600 mg by  mouth See admin instructions. Take 300 mg by mouth in the morning, 300 mg at noon and 600 mg at bedtime 09/21/16  Yes Rivet, Carly J, MD  insulin aspart (NOVOLOG) 100 UNIT/ML FlexPen Before each meal 3 times a day, 140-199 - 2 units, 200-250 - 4 units, 251-299 - 6 units,  300-349 - 8 units,  350 or above 10 units. 11/24/21  Yes Thurnell Lose, MD  insulin glargine (LANTUS SOLOSTAR) 100 UNIT/ML Solostar Pen Inject 32 Units into the skin at bedtime. 11/24/21  Yes Thurnell Lose, MD  midodrine (PROAMATINE) 5 MG tablet Take 1 tablet (5 mg total) by mouth 2 (two) times daily with a meal. 11/25/21  Yes Thurnell Lose, MD  pantoprazole (PROTONIX) 40 MG tablet Take 1 tablet (40 mg total) by mouth daily. 11/07/21 12/07/21 Yes Dahal, Marlowe Aschoff, MD  propranolol ER (INDERAL LA) 120 MG 24 hr capsule Take 40 mg by mouth in the morning, at noon, and at bedtime. 09/17/15  Yes [provider]  traZODone (DESYREL) 50 MG tablet Take 1 tablet (50 mg total) by mouth at bedtime as needed for sleep. 11/01/21  Yes Allie Bossier, MD  vitamin B-12 (CYANOCOBALAMIN) 1000 MCG tablet Take 1,000 mcg by mouth daily.   Yes [provider]  Blood Glucose  Monitoring Suppl (ACCU-CHEK AVIVA PLUS) w/Device KIT The patient IS insulin requiring, ICD 10 code E11.9. The patient tests 4 times per day. 06/04/17   Lenore Cordia, MD  cephALEXin (KEFLEX) 500 MG capsule Take 1 capsule (500 mg total) by mouth 3 (three) times daily for 7 days. Patient not taking: Reported on 11/26/2021 11/24/21 12/01/21  Thurnell Lose, MD  glucose blood (ACCU-CHEK AVIVA PLUS) test strip Use as instructed. The patient IS insulin requiring, ICD 10 code E11.9. The patient tests 4 times per day. 09/23/16   Rivet, Sindy Guadeloupe, MD  Insulin Pen Needle 32G X 4 MM MISC Use to inject lantus once dialy 10/29/17   Lorella Nimrod, MD  Lancets (ACCU-CHEK SOFT TOUCH) lancets Use as instructed 09/23/16   Rivet, Sindy Guadeloupe, MD      Allergies    Amoxicillin, Bee venom, Penicillins,  Pneumococcal vaccines, Sulfa antibiotics, and Bactrim [sulfamethoxazole-trimethoprim]    Review of Systems   Review of Systems  Neurological:  Positive for dizziness, weakness and light-headedness.  All other systems reviewed and are negative.   Physical Exam Updated Vital Signs BP (!) 142/74   Pulse 92   Temp (!) 97.5 F (36.4 C) (Oral)   Resp (!) 23   Ht _0  (1.702 m)   Wt 70.3 kg   SpO2 93%   BMI 24.28 kg/m  Physical Exam Vitals and nursing note reviewed.  Constitutional:      Appearance: Normal appearance.  HENT:     Head: Normocephalic and atraumatic.     Right Ear: External ear normal.     Left Ear: External ear normal.     Nose: Nose normal.     Mouth/Throat:     Mouth: Mucous membranes are dry.  Eyes:     Extraocular Movements: Extraocular movements intact.     Conjunctiva/sclera: Conjunctivae normal.     Pupils: Pupils are equal, round, and reactive to light.  Cardiovascular:     Rate and Rhythm: Regular rhythm. Tachycardia present.     Pulses: Normal pulses.     Heart sounds: Normal heart sounds.  Pulmonary:     Effort: Pulmonary effort is normal.     Breath sounds: Normal breath sounds.  Abdominal:     General: Abdomen is flat. Bowel sounds are normal.     Palpations: Abdomen is soft.  Musculoskeletal:        General: Normal range of motion.     Cervical back: Normal range of motion and neck supple.  Skin:    General: Skin is warm.     Capillary Refill: Capillary refill takes less than 2 seconds.  Neurological:     General: No focal deficit present.     Mental Status: She is alert and oriented to person, place, and time.  Psychiatric:        Mood and Affect: Mood normal.        Behavior: Behavior normal.     ED Results / Procedures / Treatments   Labs (all labs ordered are listed, but only abnormal results are displayed) Labs Reviewed  COMPREHENSIVE METABOLIC PANEL - Abnormal; Notable for the following components:      Result Value    Glucose, Bld 220 (*)    Calcium 7.9 (*)    Total Protein 5.6 (*)    Albumin 1.9 (*)    All other components within normal limits  CBC WITH DIFFERENTIAL/PLATELET - Abnormal; Notable for the following components:   WBC 11.5 (*)  RBC 3.34 (*)    Hemoglobin 7.9 (*)    HCT 26.2 (*)    MCV 78.4 (*)    MCH 23.7 (*)    Neutro Abs 8.5 (*)    Abs Immature Granulocytes 0.17 (*)    All other components within normal limits  MAGNESIUM - Abnormal; Notable for the following components:   Magnesium 1.6 (*)    All other components within normal limits  URINALYSIS, ROUTINE W REFLEX MICROSCOPIC  POC OCCULT BLOOD, ED  TYPE AND SCREEN  PREPARE RBC (CROSSMATCH)    EKG EKG Interpretation  Date/Time:  Wednesday November 26 2021 19:16:38 EDT Ventricular Rate:  102 PR Interval:  143 QRS Duration: 91 QT Interval:  353 QTC Calculation: 460 R Axis:   61 Text Interpretation: Sinus tachycardia No significant change since last tracing Confirmed by Isla Pence [phone]) on 11/26/2021 9:57:02 PM  Radiology DG Pelvis Portable  Result Date: 11/26/2021 CLINICAL DATA:  Fall. EXAM: PORTABLE PELVIS 1-2 VIEWS COMPARISON:  11/14/2021 FINDINGS: There is no evidence of pelvic fracture or diastasis. There is no dislocation at the hips. Fixation hardware is noted in the proximal left femur without evidence of hardware loosening. No pelvic bone lesions are seen. IMPRESSION: No acute fracture or dislocation. Electronically Signed   By: Brett Fairy M.D.   On: 11/26/2021 22:15   DG Chest Portable 1 View  Result Date: 11/26/2021 CLINICAL DATA:  Dizziness, fell, hypotension EXAM: PORTABLE CHEST 1 VIEW COMPARISON:  11/21/2021 FINDINGS: Single frontal view of the chest demonstrates an unremarkable cardiac silhouette. No acute airspace disease. Trace left pleural effusion versus pleural thickening. No pneumothorax. No acute displaced fracture. IMPRESSION: 1. Trace left pleural effusion versus pleural thickening. 2. Otherwise  unremarkable exam. Electronically Signed   By: Randa Ngo M.D.   On: 11/26/2021 20:44    Procedures Procedures    Medications Ordered in ED Medications  0.9 %  sodium chloride infusion (has no administration in time range)  sodium chloride 0.9 % bolus 1,000 mL (has no administration in time range)  sodium chloride 0.9 % bolus 1,000 mL (0 mLs Intravenous Stopped 11/26/21 2121)  ondansetron (ZOFRAN) injection 4 mg (4 mg Intravenous Given 11/26/21 2248)    ED Course/ Medical Decision Making/ A&P                           Medical Decision Making Amount and/or Complexity of Data Reviewed Labs: ordered. Radiology: ordered.  Risk Prescription drug management.   This patient presents to the ED for concern of weakness, this involves an extensive number of treatment options, and is a complaint that carries with it a high risk of complications and morbidity.  The differential diagnosis includes sepsis, anemia, electrolyte abn   Co morbidities that complicate the patient evaluation  DM, hypothyroidism, brain aneurysm, high cholesterol, skin cancer, kidney stones, and osteoporosis.   Additional history obtained:  Additional history obtained from epic chart review External records from outside source obtained and reviewed including EMS report   Lab Tests:  I Ordered, and personally interpreted labs.  The pertinent results include:  cbc with anemia (hgb 7.9, it was 8.7 yesterday, 9.2 on the 3rd and 10.2 on 7/2); cmp with glucose 220; ua still pending.   Imaging Studies ordered:  I ordered imaging studies including cxr  I independently visualized and interpreted imaging which showed  IMPRESSION:  1. Trace left pleural effusion versus pleural thickening.  2. Otherwise unremarkable exam.   I  agree with the radiologist interpretation   Cardiac Monitoring:  The patient was maintained on a cardiac monitor.  I personally viewed and interpreted the cardiac monitored which showed an  underlying rhythm of: initially sinus tach, now nsr   Medicines ordered and prescription drug management:  I ordered medication including ivfs  for dehydration and blood for symptomatic anemia  Reevaluation of the patient after these medicines showed that the patient improved I have reviewed the patients home medicines and have made adjustments as needed   Test Considered:  Ct head   Critical Interventions:  Blood for transfusion   Problem List / ED Course:  SVT:  unfortunately, there are no strips.  Pt feels much better after adenosine. Anemia: pt reports dark stools, but no stool in vault tonight.  Vault mucous guaiac neg.  Hgb dropping from 10.2 on 7/2 to 7.9 today.  I will transfuse 1 unit today.   Reevaluation:  After the interventions noted above, I reevaluated the patient and found that they have :improved   Social Determinants of Health:  Lives at home   Dispostion:  After consideration of the diagnostic results and the patients response to treatment, I feel that the patent would benefit from admission.    CRITICAL CARE Performed by: Isla Pence   Total critical care time: 30 minutes  Critical care time was exclusive of separately billable procedures and treating other patients.  Critical care was necessary to treat or prevent imminent or life-threatening deterioration.  Critical care was time spent personally by me on the following activities: development of treatment plan with patient and/or surrogate as well as nursing, discussions with consultants, evaluation of patient's response to treatment, examination of patient, obtaining history from patient or surrogate, ordering and performing treatments and interventions, ordering and review of laboratory studies, ordering and review of radiographic studies, pulse oximetry and re-evaluation of patient's condition.         Final Clinical Impression(s) / ED Diagnoses Final diagnoses:  Symptomatic  anemia  SVT (supraventricular tachycardia) (Stamps)    Rx / DC Orders ED Discharge Orders     None         Isla Pence, MD 11/26/21 2323

## 2021-11-26 NOTE — ED Notes (Signed)
Pt's sister. Shirlean Mylar has to go upstairs to be with family and would like an update when available. Her number is in the chart.

## 2021-11-27 DIAGNOSIS — G250 Essential tremor: Secondary | ICD-10-CM | POA: Diagnosis not present

## 2021-11-27 DIAGNOSIS — Z833 Family history of diabetes mellitus: Secondary | ICD-10-CM | POA: Diagnosis not present

## 2021-11-27 DIAGNOSIS — Z794 Long term (current) use of insulin: Secondary | ICD-10-CM | POA: Diagnosis not present

## 2021-11-27 DIAGNOSIS — W19XXXD Unspecified fall, subsequent encounter: Secondary | ICD-10-CM | POA: Diagnosis present

## 2021-11-27 DIAGNOSIS — Z87891 Personal history of nicotine dependence: Secondary | ICD-10-CM | POA: Diagnosis not present

## 2021-11-27 DIAGNOSIS — R42 Dizziness and giddiness: Secondary | ICD-10-CM | POA: Diagnosis present

## 2021-11-27 DIAGNOSIS — D649 Anemia, unspecified: Secondary | ICD-10-CM | POA: Diagnosis not present

## 2021-11-27 DIAGNOSIS — Z801 Family history of malignant neoplasm of trachea, bronchus and lung: Secondary | ICD-10-CM | POA: Diagnosis not present

## 2021-11-27 DIAGNOSIS — I471 Supraventricular tachycardia: Secondary | ICD-10-CM | POA: Diagnosis present

## 2021-11-27 DIAGNOSIS — E7800 Pure hypercholesterolemia, unspecified: Secondary | ICD-10-CM | POA: Diagnosis present

## 2021-11-27 DIAGNOSIS — Z79899 Other long term (current) drug therapy: Secondary | ICD-10-CM | POA: Diagnosis not present

## 2021-11-27 DIAGNOSIS — N136 Pyonephrosis: Secondary | ICD-10-CM | POA: Diagnosis present

## 2021-11-27 DIAGNOSIS — E876 Hypokalemia: Secondary | ICD-10-CM | POA: Diagnosis not present

## 2021-11-27 DIAGNOSIS — M810 Age-related osteoporosis without current pathological fracture: Secondary | ICD-10-CM | POA: Diagnosis present

## 2021-11-27 DIAGNOSIS — E119 Type 2 diabetes mellitus without complications: Secondary | ICD-10-CM | POA: Diagnosis not present

## 2021-11-27 DIAGNOSIS — E039 Hypothyroidism, unspecified: Secondary | ICD-10-CM | POA: Diagnosis present

## 2021-11-27 DIAGNOSIS — D62 Acute posthemorrhagic anemia: Secondary | ICD-10-CM | POA: Diagnosis present

## 2021-11-27 DIAGNOSIS — Z880 Allergy status to penicillin: Secondary | ICD-10-CM | POA: Diagnosis not present

## 2021-11-27 DIAGNOSIS — Z85828 Personal history of other malignant neoplasm of skin: Secondary | ICD-10-CM | POA: Diagnosis not present

## 2021-11-27 DIAGNOSIS — Z808 Family history of malignant neoplasm of other organs or systems: Secondary | ICD-10-CM | POA: Diagnosis not present

## 2021-11-27 DIAGNOSIS — Z825 Family history of asthma and other chronic lower respiratory diseases: Secondary | ICD-10-CM | POA: Diagnosis not present

## 2021-11-27 DIAGNOSIS — E860 Dehydration: Secondary | ICD-10-CM | POA: Diagnosis present

## 2021-11-27 DIAGNOSIS — N390 Urinary tract infection, site not specified: Secondary | ICD-10-CM | POA: Diagnosis present

## 2021-11-27 DIAGNOSIS — I951 Orthostatic hypotension: Secondary | ICD-10-CM | POA: Diagnosis present

## 2021-11-27 DIAGNOSIS — S72032D Displaced midcervical fracture of left femur, subsequent encounter for closed fracture with routine healing: Secondary | ICD-10-CM | POA: Diagnosis not present

## 2021-11-27 DIAGNOSIS — Z7983 Long term (current) use of bisphosphonates: Secondary | ICD-10-CM | POA: Diagnosis not present

## 2021-11-27 DIAGNOSIS — Z7982 Long term (current) use of aspirin: Secondary | ICD-10-CM | POA: Diagnosis not present

## 2021-11-27 LAB — COMPREHENSIVE METABOLIC PANEL
ALT: 26 U/L (ref 0–44)
AST: 16 U/L (ref 15–41)
Albumin: 2.1 g/dL — ABNORMAL LOW (ref 3.5–5.0)
Alkaline Phosphatase: 101 U/L (ref 38–126)
Anion gap: 12 (ref 5–15)
BUN: 5 mg/dL — ABNORMAL LOW (ref 8–23)
CO2: 22 mmol/L (ref 22–32)
Calcium: 7.9 mg/dL — ABNORMAL LOW (ref 8.9–10.3)
Chloride: 105 mmol/L (ref 98–111)
Creatinine, Ser: 0.72 mg/dL (ref 0.44–1.00)
GFR, Estimated: >60 mL/min (ref >60)
Glucose, Bld: 153 mg/dL — ABNORMAL HIGH (ref 70–99)
Potassium: 3.3 mmol/L — ABNORMAL LOW (ref 3.5–5.1)
Sodium: 139 mmol/L (ref 135–145)
Total Bilirubin: 0.6 mg/dL (ref 0.3–1.2)
Total Protein: 5.7 g/dL — ABNORMAL LOW (ref 6.5–8.1)

## 2021-11-27 LAB — CBC WITH DIFFERENTIAL/PLATELET
Abs Immature Granulocytes: 0.11 10*3/uL — ABNORMAL HIGH (ref 0.00–0.07)
Basophils Absolute: 0.1 10*3/uL (ref 0.0–0.1)
Basophils Relative: 1 %
Eosinophils Absolute: 0.3 10*3/uL (ref 0.0–0.5)
Eosinophils Relative: 3 %
HCT: 29.2 % — ABNORMAL LOW (ref 36.0–46.0)
Hemoglobin: 9.4 g/dL — ABNORMAL LOW (ref 12.0–15.0)
Immature Granulocytes: 1 %
Lymphocytes Relative: 25 %
Lymphs Abs: 2.6 10*3/uL (ref 0.7–4.0)
MCH: 24.9 pg — ABNORMAL LOW (ref 26.0–34.0)
MCHC: 32.2 g/dL (ref 30.0–36.0)
MCV: 77.5 fL — ABNORMAL LOW (ref 80.0–100.0)
Monocytes Absolute: 1 10*3/uL (ref 0.1–1.0)
Monocytes Relative: 9 %
Neutro Abs: 6.4 10*3/uL (ref 1.7–7.7)
Neutrophils Relative %: 61 %
Platelets: 385 10*3/uL (ref 150–400)
RBC: 3.77 MIL/uL — ABNORMAL LOW (ref 3.87–5.11)
RDW: 14.7 % (ref 11.5–15.5)
WBC: 10.4 10*3/uL (ref 4.0–10.5)
nRBC: 0 % (ref 0.0–0.2)

## 2021-11-27 LAB — CBG MONITORING, ED
Glucose-Capillary: 174 mg/dL — ABNORMAL HIGH (ref 70–99)
Glucose-Capillary: 155 mg/dL — ABNORMAL HIGH (ref 70–99)
Glucose-Capillary: 141 mg/dL — ABNORMAL HIGH (ref 70–99)
Glucose-Capillary: 214 mg/dL — ABNORMAL HIGH (ref 70–99)

## 2021-11-27 LAB — TYPE AND SCREEN
ABO/RH(D): O POS
Antibody Screen: NEGATIVE
Unit division: 0

## 2021-11-27 LAB — BPAM RBC
Blood Product Expiration Date: 202308052359
ISSUE DATE / TIME: 202307052340
Unit Type and Rh: 5100

## 2021-11-27 LAB — MAGNESIUM: Magnesium: 1.5 mg/dL — ABNORMAL LOW (ref 1.7–2.4)

## 2021-11-27 LAB — IRON AND TIBC
Iron: 24 ug/dL — ABNORMAL LOW (ref 28–170)
Saturation Ratios: 11 % (ref 10.4–31.8)
TIBC: 211 ug/dL — ABNORMAL LOW (ref 250–450)
UIBC: 187 ug/dL

## 2021-11-27 LAB — FERRITIN: Ferritin: 91 ng/mL (ref 11–307)

## 2021-11-27 LAB — GLUCOSE, CAPILLARY: Glucose-Capillary: 239 mg/dL — ABNORMAL HIGH (ref 70–99)

## 2021-11-27 LAB — PHOSPHORUS: Phosphorus: 3.6 mg/dL (ref 2.5–4.6)

## 2021-11-27 MED ORDER — GABAPENTIN 300 MG PO CAPS
300.0000 mg | ORAL_CAPSULE | Freq: Every day | ORAL | Status: DC
Start: 2021-11-27 — End: 2021-12-01
  Administered 2021-11-27 – 2021-12-01 (×5): 300 mg via ORAL
  Filled 2021-11-27 (×5): qty 1

## 2021-11-27 MED ORDER — GABAPENTIN 300 MG PO CAPS: 300.0000 mg | ORAL_CAPSULE | ORAL | Status: DC

## 2021-11-27 MED ORDER — GABAPENTIN 300 MG PO CAPS
300.0000 mg | ORAL_CAPSULE | ORAL | Status: DC
  Administered 2021-11-28 – 2021-12-01 (×4): 300 mg via ORAL
  Filled 2021-11-27 (×5): qty 1

## 2021-11-27 MED ORDER — PROPRANOLOL HCL 40 MG PO TABS
40.0000 mg | ORAL_TABLET | Freq: Three times a day (TID) | ORAL | Status: DC
  Administered 2021-11-27 – 2021-11-29 (×8): 40 mg via ORAL
  Filled 2021-11-27 (×11): qty 1

## 2021-11-27 MED ORDER — ONDANSETRON HCL 4 MG/2ML IJ SOLN
4.0000 mg | Freq: Four times a day (QID) | INTRAMUSCULAR | Status: DC | PRN
  Administered 2021-11-27: 4 mg via INTRAVENOUS
  Filled 2021-11-27: qty 2

## 2021-11-27 MED ORDER — PANTOPRAZOLE SODIUM 40 MG PO TBEC
40.0000 mg | DELAYED_RELEASE_TABLET | Freq: Every day | ORAL | Status: DC
  Administered 2021-11-27 – 2021-12-01 (×5): 40 mg via ORAL
  Filled 2021-11-27 (×5): qty 1

## 2021-11-27 MED ORDER — LACTATED RINGERS IV SOLN: INTRAVENOUS | Status: AC

## 2021-11-27 MED ORDER — INSULIN ASPART 100 UNIT/ML IJ SOLN
0.0000 [IU] | Freq: Every day | INTRAMUSCULAR | Status: DC
  Administered 2021-11-27: 2 [IU] via SUBCUTANEOUS
  Administered 2021-11-29: 3 [IU] via SUBCUTANEOUS
  Administered 2021-11-30: 2 [IU] via SUBCUTANEOUS

## 2021-11-27 MED ORDER — MIDODRINE HCL 5 MG PO TABS: 5.0000 mg | ORAL_TABLET | Freq: Two times a day (BID) | ORAL | Status: DC

## 2021-11-27 MED ORDER — DULOXETINE HCL 60 MG PO CPEP
60.0000 mg | ORAL_CAPSULE | Freq: Every day | ORAL | Status: DC
  Administered 2021-11-27 – 2021-12-01 (×5): 60 mg via ORAL
  Filled 2021-11-27: qty 1
  Filled 2021-11-27: qty 2
  Filled 2021-11-27 (×3): qty 1

## 2021-11-27 MED ORDER — ASPIRIN 325 MG PO TABS
325.0000 mg | ORAL_TABLET | Freq: Every day | ORAL | Status: DC
  Administered 2021-11-27 – 2021-12-01 (×5): 325 mg via ORAL
  Filled 2021-11-27 (×5): qty 1

## 2021-11-27 MED ORDER — INSULIN ASPART 100 UNIT/ML IJ SOLN
0.0000 [IU] | Freq: Three times a day (TID) | INTRAMUSCULAR | Status: DC
  Administered 2021-11-27: 2 [IU] via SUBCUTANEOUS
  Administered 2021-11-27 – 2021-11-28 (×2): 3 [IU] via SUBCUTANEOUS
  Administered 2021-11-28: 2 [IU] via SUBCUTANEOUS
  Administered 2021-11-28: 3 [IU] via SUBCUTANEOUS
  Administered 2021-11-29 (×2): 5 [IU] via SUBCUTANEOUS
  Administered 2021-11-29: 2 [IU] via SUBCUTANEOUS
  Administered 2021-11-30: 8 [IU] via SUBCUTANEOUS
  Administered 2021-11-30 – 2021-12-01 (×2): 5 [IU] via SUBCUTANEOUS
  Administered 2021-12-01: 8 [IU] via SUBCUTANEOUS

## 2021-11-27 MED ORDER — POLYETHYLENE GLYCOL 3350 17 G PO PACK: 17.0000 g | PACK | Freq: Every day | ORAL | Status: DC | PRN

## 2021-11-27 MED ORDER — POTASSIUM CHLORIDE CRYS ER 10 MEQ PO TBCR
40.0000 meq | EXTENDED_RELEASE_TABLET | Freq: Once | ORAL | Status: AC
  Administered 2021-11-27: 40 meq via ORAL
  Filled 2021-11-27: qty 4

## 2021-11-27 MED ORDER — ATORVASTATIN CALCIUM 40 MG PO TABS
40.0000 mg | ORAL_TABLET | Freq: Every day | ORAL | Status: DC
  Administered 2021-11-27 – 2021-12-01 (×5): 40 mg via ORAL
  Filled 2021-11-27 (×5): qty 1

## 2021-11-27 MED ORDER — TRAZODONE HCL 50 MG PO TABS
50.0000 mg | ORAL_TABLET | Freq: Every evening | ORAL | Status: DC | PRN
Start: 2021-11-27 — End: 2021-12-01
  Administered 2021-11-27 – 2021-12-01 (×3): 50 mg via ORAL
  Filled 2021-11-27 (×3): qty 1

## 2021-11-27 MED ORDER — CEPHALEXIN 250 MG PO CAPS: 500.0000 mg | ORAL_CAPSULE | Freq: Three times a day (TID) | ORAL | Status: DC

## 2021-11-27 MED ORDER — GABAPENTIN 300 MG PO CAPS
600.0000 mg | ORAL_CAPSULE | Freq: Every day | ORAL | Status: DC
  Administered 2021-11-27 – 2021-11-30 (×4): 600 mg via ORAL
  Filled 2021-11-27 (×4): qty 2

## 2021-11-27 MED ORDER — ALENDRONATE SODIUM 70 MG PO TABS: 70.0000 mg | ORAL_TABLET | ORAL | Status: DC

## 2021-11-27 MED ORDER — ACETAMINOPHEN 325 MG PO TABS
650.0000 mg | ORAL_TABLET | Freq: Four times a day (QID) | ORAL | Status: DC | PRN
  Administered 2021-11-28 – 2021-11-30 (×4): 650 mg via ORAL
  Filled 2021-11-27 (×4): qty 2

## 2021-11-27 MED ORDER — CEPHALEXIN 500 MG PO CAPS
500.0000 mg | ORAL_CAPSULE | Freq: Three times a day (TID) | ORAL | Status: DC
Start: 2021-11-27 — End: 2021-12-01
  Administered 2021-11-27 – 2021-12-01 (×12): 500 mg via ORAL
  Filled 2021-11-27 (×12): qty 1

## 2021-11-27 MED ORDER — MIDODRINE HCL 5 MG PO TABS
5.0000 mg | ORAL_TABLET | Freq: Two times a day (BID) | ORAL | Status: DC
  Administered 2021-11-29 – 2021-12-01 (×4): 5 mg via ORAL
  Filled 2021-11-27 (×6): qty 1

## 2021-11-27 MED ORDER — MAGNESIUM SULFATE 2 GM/50ML IV SOLN
2.0000 g | Freq: Once | INTRAVENOUS | Status: AC
  Administered 2021-11-27: 2 g via INTRAVENOUS
  Filled 2021-11-27: qty 50

## 2021-11-27 MED ORDER — MAGNESIUM SULFATE 4 GM/100ML IV SOLN
4.0000 g | Freq: Once | INTRAVENOUS | Status: AC
  Administered 2021-11-27: 4 g via INTRAVENOUS
  Filled 2021-11-27 (×2): qty 100

## 2021-11-27 MED ORDER — VITAMIN B-12 1000 MCG PO TABS
1000.0000 ug | ORAL_TABLET | Freq: Every day | ORAL | Status: DC
Start: 2021-11-27 — End: 2021-12-01
  Administered 2021-11-27 – 2021-12-01 (×5): 1000 ug via ORAL
  Filled 2021-11-27 (×5): qty 1

## 2021-11-27 NOTE — ED Provider Notes (Signed)
Care of the patient assumed at the change of shift. Multiple recent admissions for infected renal stents, fall with hip fracture and DKA. Now with postural dizziness acute anemia and low BP at home. Also noted by EMS to be in SVT, converted with adenosine and has been SR in the ED. Pending UA and CT head with planned admission.  Physical Exam  BP (!) 170/85   Pulse 85   Temp 98.1 F (36.7 C) (Oral)   Resp 19   Ht '5\' 7"'$  (1.702 m)   Wt 70.3 kg   SpO2 96%   BMI 24.28 kg/m   Physical Exam  Procedures  Procedures  ED Course / MDM   Clinical Course as of 11/27/21 0357  Thu Nov 27, 2021  0009 UA is clear, CT head is negative. Hospitalist paged for admission.  [CS]  0039 Spoke with Dr. Nevada Crane, Hospitalist, who will evaluate for admission.  [CS]    Clinical Course User Index [CS] Truddie Hidden, MD   Medical Decision Making Amount and/or Complexity of Data Reviewed Labs: ordered. Radiology: ordered.  Risk Prescription drug management. Decision regarding hospitalization.          Truddie Hidden, MD 11/27/21 [phone]

## 2021-11-27 NOTE — Evaluation (Signed)
Physical Therapy Evaluation Patient Details Name: Laura Gentry MRN: [id number] DOB: 04-11-1956 Today's Date: 11/27/2021  History of Present Illness  Pt adm 7/5 with dizziness and fall. Pt with symptomatic anemia with a Hgb of 7.9.  Recent hospitalization for AMS, dka, sepsis 6/30-7/4;  L hip fx 6/11-6/16; sepsis 2/2 UTI 6/6-6/10.  PMH: cerebral aneurysm s/p clipping, diabetes, thyroid disease, orhtostatic hypotension.  Clinical Impression  Pt presents to PT with decr mobility after 4 back to back hospitalizations. Pt needs ST-SNF for further therapy prior to returning home. Pt lives with mother (34 and currently hospitalized) and sister who works and is also dealing with mother's illness/hospitalization. Feel pt will be able to return to her prior independent level with SNF.   Pt denied any lightheadedness or dizziness throughout session.  Orthostatic BPs  Supine 149/89  Sitting 147/75  Standing 125/68  Standing after 3 min 125/67        Recommendations for follow up therapy are one component of a multi-disciplinary discharge planning process, led by the attending physician.  Recommendations may be updated based on patient status, additional functional criteria and insurance authorization.  Follow Up Recommendations Skilled nursing-short term rehab (<3 hours/day) Can patient physically be transported by private vehicle: Yes    Assistance Recommended at Discharge Intermittent Supervision/Assistance  Patient can return home with the following  A little help with walking and/or transfers;Assist for transportation;Help with stairs or ramp for entrance    Equipment Recommendations None recommended by PT  Recommendations for Other Services       Functional Status Assessment Patient has had a recent decline in their functional status and demonstrates the ability to make significant improvements in function in a reasonable and predictable amount of time.     Precautions /  Restrictions Precautions Precautions: Fall Precaution Comments: watch bp      Mobility  Bed Mobility Overal bed mobility: Needs Assistance Bed Mobility: Supine to Sit     Supine to sit: Min guard     General bed mobility comments: Assist for safety    Transfers Overall transfer level: Needs assistance Equipment used: Rolling walker (2 wheels) Transfers: Sit to/from Stand Sit to Stand: Min assist           General transfer comment: Assist for balance and verbal cues for hand placement    Ambulation/Gait Ambulation/Gait assistance: Min assist Gait Distance (Feet): 90 Feet Assistive device: Rolling walker (2 wheels) Gait Pattern/deviations: Step-through pattern, Decreased step length - right, Decreased step length - left, Decreased stride length, Decreased weight shift to left Gait velocity: decr Gait velocity interpretation: <1.31 ft/sec, indicative of household ambulator   General Gait Details: Assist for balance and support. Verbal cues to stand more erect  Stairs            Wheelchair Mobility    Modified Rankin (Stroke Patients Only)       Balance Overall balance assessment: Needs assistance Sitting-balance support: No upper extremity supported Sitting balance-Leahy Scale: Good     Standing balance support: Bilateral upper extremity supported, During functional activity Standing balance-Leahy Scale: Poor Standing balance comment: walker and min guard for static standing                             Pertinent Vitals/Pain Pain Assessment Pain Assessment: No/denies pain    Home Living Family/patient expects to be discharged to:: Private residence Living Arrangements: Parent;Other relatives (sister) Available Help at Discharge: Family;Available 24  hours/day Type of Home: House Home Access: Stairs to enter   [employer] of Steps: 7 Alternate Level Stairs-Number of Steps: flight Home Layout: Multi-level;1/2 bath on main  level Home Equipment: Conservation officer, nature (2 wheels);Hand held shower head;Grab bars - tub/shower;Tub bench Additional Comments: lives with her sister and her mother who is 60    Prior Function Prior Level of Function : Needs assist       Physical Assist : Mobility (physical) Mobility (physical): Transfers;Gait   Mobility Comments: Using RW since recent hip fx. Was independent prior to 3 recent hospitalizations       Hand Dominance   Dominant Hand: Right    Extremity/Trunk Assessment   Upper Extremity Assessment Upper Extremity Assessment: Defer to OT evaluation    Lower Extremity Assessment Lower Extremity Assessment: Generalized weakness       Communication   Communication: No difficulties  Cognition Arousal/Alertness: Awake/alert Behavior During Therapy: WFL for tasks assessed/performed Overall Cognitive Status: Within Functional Limits for tasks assessed                                          General Comments General comments (skin integrity, edema, etc.): See  vitals or assessment for orthostatic vitals    Exercises     Assessment/Plan    PT Assessment Patient needs continued PT services  PT Problem List Decreased strength;Decreased balance;Decreased mobility       PT Treatment Interventions DME instruction;Gait training;Stair training;Functional mobility training;Therapeutic activities;Therapeutic exercise;Balance training;Patient/family education    PT Goals (Current goals can be found in the Care Plan section)  Acute Rehab PT Goals Patient Stated Goal: go to rehab to get stronger PT Goal Formulation: With patient Time For Goal Achievement: 12/11/21 Potential to Achieve Goals: Good    Frequency Min 3X/week     Co-evaluation               AM-PAC PT "6 Clicks" Mobility  Outcome Measure Help needed turning from your back to your side while in a flat bed without using bedrails?: A Little Help needed moving from lying on your  back to sitting on the side of a flat bed without using bedrails?: A Little Help needed moving to and from a bed to a chair (including a wheelchair)?: A Little Help needed standing up from a chair using your arms (e.g., wheelchair or bedside chair)?: A Little Help needed to walk in hospital room?: A Little Help needed climbing 3-5 steps with a railing? : A Little 6 Click Score: 18    End of Session Equipment Utilized During Treatment: Gait belt Activity Tolerance: Patient tolerated treatment well Patient left: in chair;with call bell/phone within reach Nurse Communication: Mobility status PT Visit Diagnosis: Other abnormalities of gait and mobility (R26.89);Muscle weakness (generalized) (M62.81)    Time: 2595-6387 PT Time Calculation (min) (ACUTE ONLY): 36 min   Charges:   PT Evaluation $PT Eval Moderate Complexity: 1 Mod PT Treatments $Gait Training: 8-22 mins        [place] [facility] [place] 11/27/2021, 12:00 PM

## 2021-11-27 NOTE — ED Notes (Signed)
Admit provider at bedside at this time 

## 2021-11-27 NOTE — Progress Notes (Signed)
PROGRESS NOTE    Laura Gentry  ION:[id number] DOB: Jan 10, 1956 DOA: 11/26/2021 PCP: Laura Carol, MD    Chief Complaint  Patient presents with   Dizziness   Fall    Brief Narrative: HPI per Dr. Mosie Epstein Laura Gentry is a 66 y.o. female with medical history significant for type 2 diabetes, hyperlipidemia, essential hypertension, recently diagnosed and admitted for sepsis secondary to obstructing renal stone, left hip fracture post mechanical fall requiring surgical intervention who presents to [facility] ED with complaints of dizziness and a reported fall.  Denies hitting her head.  No reported fevers.  Work-up in the ED reveals drop in hemoglobin to 7.9 from baseline of 10.  1 U PRBCs ordered by EDP to be transfused.  EDP requested admission due to concern for symptomatic anemia.  The patient was admitted by the hospitalist service, TRH.   ED Course: Tmax 98.1.  BP 157/76.  Pulse 88.  Respiratory 20, O2 saturation 96% on room air.  Lab studies remarkable for WBC 11.5.  Hemoglobin 7.9.  MCV 78.  UA positive for pyuria.  Assessment & Plan:   Principal Problem:   Dizziness  #1 dizziness, unclear etiology concern for symptomatic anemia versus hypovolemia, intravascular depletion versus orthostasis -Patient noted to have presented with dizziness, and a fall. -Concern for symptomatic anemia as hemoglobin noted at 7.9 on admission from 8.7 (11/25/2021) -Patient denies any overt bleeding. -Status posttransfusion 1 unit packed red blood cells 11/27/2021 with hemoglobin currently at 9.4. -Repeat orthostatics done today per PT with some drop in blood pressure however patient remained asymptomatic. -Patient noted with chronic hypotension noted on midodrine. -Continue gentle hydration with IV fluids. -Place back on home regimen midodrine.  2.  Acute blood loss anemia -Baseline hemoglobin approximately 10. -Hemoglobin noted at 7.9 on presentation however was 8.7 during recent discharge  (11/25/2021). -Status post transfusion 1 unit packed red blood cells hemoglobin currently at 9.4. -Anemia panel ordered with iron level of 24, ferritin of 91, TIBC of 211. -Continue PPI -Follow H&H.  3.  Type 2 diabetes mellitus with hyperglycemia -Hemoglobin A1c 8.8 (10/29/2021). -CBG of 155 this morning -SSI.  4.  Hypomagnesemia -Magnesium noted at 1.6 and IV magnesium ordered. -Repeat magnesium level at 1.5. -Magnesium sulfate 4 g IV x1. -Repeat labs in the AM.  5.  History of orthostatic hypotension -Continue TED hose. -Resume home regimen midodrine.  6.  History of recent sepsis due to complicated UTI/pyelonephritis, POA -From recent discharge summary patient noted to have been discharged on Keflex 500 3 times daily for 7 more days on discharge however patient stated pharmacy was not open on July 4 and has not had any antibiotics since discharge. -Resume Keflex 500 mg 3 times daily x7 days as noted on recent discharge summary.  7.  Status post recent left hip fracture with repair -PT/OT. -Pain management. -Will need SNF placement. -   DVT prophylaxis: Aspirin Code Status: Full Family Communication: Updated patient.  No family at bedside. Disposition: SNF  Status is: Inpatient The patient will require care spanning > 2 midnights and should be moved to inpatient because: Unsafe disposition   Consultants:  None  Procedures:  CT head without contrast 11/26/2021 Plan.  The pelvis 11/26/2021 Chest x-ray 11/26/2021   Antimicrobials:  Keflex 11/27/2021>>>>>   Subjective: Sitting up at the side of the gurney with therapy at bedside.  No chest pain.  No shortness of breath.  No abdominal pain.  States overall feeling better.  Patient asymptomatic  when orthostatic vital signs were being checked.  Objective: Vitals:   11/27/21 0700 11/27/21 0748 11/27/21 0830 11/27/21 1000  BP: (!) 180/79 (!) 171/84  (!) 168/94  Pulse: 87 84 81 84  Resp: (!) '26  19 16  '$ Temp:  97.9 F (36.6  C)    TempSrc:  Oral    SpO2: 96% 97% 98% 98%  Weight:      Height:        Intake/Output Summary (Last 24 hours) at 11/27/2021 1131 Last data filed at 11/27/2021 0747 Gross per 24 hour  Intake 2198.07 ml  Output 900 ml  Net 1298.07 ml   Filed Weights   11/26/21 2233  Weight: 70.3 kg    Examination:  General exam: Appears calm and comfortable  Respiratory system: Clear to auscultation. Respiratory effort normal. Cardiovascular system: S1 & S2 heard, RRR. No JVD, murmurs, rubs, gallops or clicks. No pedal edema. Gastrointestinal system: Abdomen is nondistended, soft and nontender. No organomegaly or masses felt. Normal bowel sounds heard. Central nervous system: Alert and oriented. No focal neurological deficits. Extremities: Symmetric 5 x 5 power. Skin: No rashes, lesions or ulcers Psychiatry: Judgement and insight appear normal. Mood & affect appropriate.     Data Reviewed: I have personally reviewed following labs and imaging studies  CBC: Recent Labs  Lab 11/21/21 1310 11/21/21 1345 11/23/21 0710 11/24/21 0048 11/25/21 0114 11/26/21 2042 11/27/21 0412  WBC 8.7   < > 11.2* 10.0 11.2* 11.5* 10.4  NEUTROABS 7.4  --   --  7.0 7.9* 8.5* 6.4  HGB 11.6*   < > 10.2* 9.2* 8.7* 7.9* 9.4*  HCT 38.3   < > 33.0* 29.0* 28.3* 26.2* 29.2*  MCV 82.4   < > 77.6* 75.3* 76.5* 78.4* 77.5*  PLT 300   < > 307 292 317 370 385   < > = values in this interval not displayed.    Basic Metabolic Panel: Recent Labs  Lab 11/23/21 0225 11/24/21 0048 11/25/21 0114 11/26/21 2042 11/26/21 2235 11/27/21 0412  NA 135 135 137 138  --  139  K 3.2* 3.5 3.5 3.8  --  3.3*  CL 105 100 99 101  --  105  CO2 '24 25 26 23  '$ --  22  GLUCOSE 145* 185* 149* 220*  --  153*  BUN 9 6* 6* 8  --  5*  CREATININE 0.97 0.79 0.92 0.75  --  0.72  CALCIUM 7.9* 8.1* 8.2* 7.9*  --  7.9*  MG 2.3 1.5* 1.9  --  1.6* 1.5*  PHOS  --   --   --   --   --  3.6    GFR: Estimated Creatinine Clearance: 67.3 mL/min (by  C-G formula based on SCr of 0.72 mg/dL).  Liver Function Tests: Recent Labs  Lab 11/21/21 1310 11/26/21 2042 11/27/21 0412  AST '17 17 16  '$ ALT '14 26 26  '$ ALKPHOS 104 106 101  BILITOT 1.1 0.8 0.6  PROT 6.8 5.6* 5.7*  ALBUMIN 3.0* 1.9* 2.1*    CBG: Recent Labs  Lab 11/24/21 2136 11/25/21 0828 11/25/21 1151 11/27/21 0148 11/27/21 0818  GLUCAP 159* 157* 158* 174* 155*     Recent Results (from the past 240 hour(s))  Blood culture (routine x 2)     Status: None   Collection Time: 11/21/21  1:03 PM   Specimen: BLOOD RIGHT HAND  Result Value Ref Range Status   Specimen Description BLOOD RIGHT HAND  Final   Special Requests  Final    BOTTLES DRAWN AEROBIC AND ANAEROBIC Blood Culture adequate volume   Culture   Final    NO GROWTH 5 DAYS Performed at [facility], [place] [address], [place], [place] [address]    Report Status 11/26/2021 FINAL  Final  Blood culture (routine x 2)     Status: None   Collection Time: 11/21/21  1:08 PM   Specimen: BLOOD LEFT FOREARM  Result Value Ref Range Status   Specimen Description BLOOD LEFT FOREARM  Final   Special Requests   Final    BOTTLES DRAWN AEROBIC AND ANAEROBIC Blood Culture adequate volume   Culture   Final    NO GROWTH 5 DAYS Performed at [facility], [place] [address], [place], [place] [address]    Report Status 11/26/2021 FINAL  Final  Resp Panel by RT-PCR (Flu A&B, Covid) Anterior Nasal Swab     Status: None   Collection Time: 11/21/21  2:10 PM   Specimen: Anterior Nasal Swab  Result Value Ref Range Status   SARS Coronavirus 2 by RT PCR NEGATIVE NEGATIVE Final    Comment: (NOTE) SARS-CoV-2 target nucleic acids are NOT DETECTED.  The SARS-CoV-2 RNA is generally detectable in upper respiratory specimens during the acute phase of infection. The lowest concentration of SARS-CoV-2 viral copies this assay can detect is 138 copies/mL. A negative result does not preclude SARS-Cov-2 infection and should not be used as  the sole basis for treatment or other patient management decisions. A negative result may occur with  improper specimen collection/handling, submission of specimen other than nasopharyngeal swab, presence of viral mutation(s) within the areas targeted by this assay, and inadequate number of viral copies(<138 copies/mL). A negative result must be combined with clinical observations, patient history, and epidemiological information. The expected result is Negative.  Fact Sheet for Patients:  EntrepreneurPulse.com.au  Fact Sheet for Healthcare Providers:  IncredibleEmployment.be  This test is no t yet approved or cleared by the [place] FDA and  has been authorized for detection and/or diagnosis of SARS-CoV-2 by FDA under an Emergency Use Authorization (EUA). This EUA will remain  in effect (meaning this test can be used) for the duration of the COVID-19 declaration under Section 564(b)(1) of the Act, 21 U.S.C.section 360bbb-3(b)(1), unless the authorization is terminated  or revoked sooner.       Influenza A by PCR NEGATIVE NEGATIVE Final   Influenza B by PCR NEGATIVE NEGATIVE Final    Comment: (NOTE) The Xpert Xpress SARS-CoV-2/FLU/RSV plus assay is intended as an aid in the diagnosis of influenza from Nasopharyngeal swab specimens and should not be used as a sole basis for treatment. Nasal washings and aspirates are unacceptable for Xpert Xpress SARS-CoV-2/FLU/RSV testing.  Fact Sheet for Patients: EntrepreneurPulse.com.au  Fact Sheet for Healthcare Providers: IncredibleEmployment.be  This test is not yet approved or cleared by the [place] FDA and has been authorized for detection and/or diagnosis of SARS-CoV-2 by FDA under an Emergency Use Authorization (EUA). This EUA will remain in effect (meaning this test can be used) for the duration of the COVID-19 declaration under Section 564(b)(1) of  the Act, 21 U.S.C. section 360bbb-3(b)(1), unless the authorization is terminated or revoked.  Performed at [facility], [place] [address], [place], [place] [address]   Urine Culture     Status: None   Collection Time: 11/21/21  3:05 PM   Specimen: Urine, Clean Catch  Result Value Ref Range Status   Specimen Description URINE,  CLEAN CATCH  Final   Special Requests NONE  Final   Culture   Final    NO GROWTH Performed at [facility], [place] [address], [place], [place] [address]    Report Status 11/23/2021 FINAL  Final         Radiology Studies: CT Head Wo Contrast  Result Date: 11/26/2021 CLINICAL DATA:  Altered mental status and dizziness, initial encounter EXAM: CT HEAD WITHOUT CONTRAST TECHNIQUE: Contiguous axial images were obtained from the base of the skull through the vertex without intravenous contrast. RADIATION DOSE REDUCTION: This exam was performed according to the departmental dose-optimization program which includes automated exposure control, adjustment of the mA and/or kV according to patient size and/or use of iterative reconstruction technique. COMPARISON:  11/21/2021 FINDINGS: Brain: No evidence of acute infarction, hemorrhage, hydrocephalus, extra-axial collection or mass lesion/mass effect. Mild atrophic changes are noted. Vascular: Aneurysm clip is again noted in the anterior aspect of the right temporal lobe Skull: Postsurgical changes are noted similar to that seen on the prior exam. No acute calvarial abnormality is noted. Sinuses/Orbits: No acute finding. Other: None. IMPRESSION: Chronic changes without acute abnormality. Electronically Signed   By: Inez Catalina M.D.   On: 11/26/2021 23:58   DG Pelvis Portable  Result Date: 11/26/2021 CLINICAL DATA:  Fall. EXAM: PORTABLE PELVIS 1-2 VIEWS COMPARISON:  11/14/2021 FINDINGS: There is no evidence of pelvic fracture or diastasis. There is no dislocation at the hips. Fixation hardware is noted in the proximal  left femur without evidence of hardware loosening. No pelvic bone lesions are seen. IMPRESSION: No acute fracture or dislocation. Electronically Signed   By: Brett Fairy M.D.   On: 11/26/2021 22:15   DG Chest Portable 1 View  Result Date: 11/26/2021 CLINICAL DATA:  Dizziness, fell, hypotension EXAM: PORTABLE CHEST 1 VIEW COMPARISON:  11/21/2021 FINDINGS: Single frontal view of the chest demonstrates an unremarkable cardiac silhouette. No acute airspace disease. Trace left pleural effusion versus pleural thickening. No pneumothorax. No acute displaced fracture. IMPRESSION: 1. Trace left pleural effusion versus pleural thickening. 2. Otherwise unremarkable exam. Electronically Signed   By: Randa Ngo M.D.   On: 11/26/2021 20:44        Scheduled Meds:  aspirin  325 mg Oral Daily   atorvastatin  40 mg Oral Daily   DULoxetine  60 mg Oral Daily   gabapentin  300 mg Oral Q24H   And   gabapentin  300 mg Oral Q1200   And   gabapentin  600 mg Oral QHS   insulin aspart  0-15 Units Subcutaneous TID WC   insulin aspart  0-5 Units Subcutaneous QHS   pantoprazole  40 mg Oral Daily   propranolol  40 mg Oral TID   vitamin B-12  1,000 mcg Oral Daily   Continuous Infusions:  lactated ringers 50 mL/hr at 11/27/21 0418   magnesium sulfate bolus IVPB       LOS: 0 days    Time spent: 35 minutes  No charge.    Irine Seal, MD [facility]   To contact the attending provider between 7A-7P or the covering provider during after hours 7P-7A, please log into the web site [email or web address] and access using universal [place] password for that web site. If you do not have the password, please call the hospital operator.  11/27/2021, 11:31 AM

## 2021-11-27 NOTE — Evaluation (Signed)
Occupational Therapy Evaluation Patient Details Name: Laura Gentry MRN: [id number] DOB: February 22, 1956 Today's Date: 11/27/2021   History of Present Illness Pt adm 7/5 with dizziness and fall. Pt with symptomatic anemia with a Hgb of 7.9.  Recent hospitalization for AMS, dka, sepsis 6/30-7/4;  L hip fx 6/11-6/16; sepsis 2/2 UTI 6/6-6/10.  PMH: cerebral aneurysm s/p clipping, diabetes, thyroid disease, orhtostatic hypotension.   Clinical Impression   Pt admitted for concerns listed above. PTA pt reported that she has been in and out of the hospital for the past month and has had difficulty caring for herself. At this time, she is requiring min guard to min A for all ADL's and functional mobility, using a RW. Recommending SNF therapies at this time, to maximize independence and safety. OT will follow acutely.       Recommendations for follow up therapy are one component of a multi-disciplinary discharge planning process, led by the attending physician.  Recommendations may be updated based on patient status, additional functional criteria and insurance authorization.   Follow Up Recommendations  Skilled nursing-short term rehab (<3 hours/day)    Assistance Recommended at Discharge Frequent or constant Supervision/Assistance  Patient can return home with the following A little help with walking and/or transfers;A little help with bathing/dressing/bathroom;Assistance with cooking/housework;Assist for transportation;Help with stairs or ramp for entrance    Functional Status Assessment  Patient has had a recent decline in their functional status and demonstrates the ability to make significant improvements in function in a reasonable and predictable amount of time.  Equipment Recommendations  None recommended by OT    Recommendations for Other Services       Precautions / Restrictions Precautions Precautions: Fall Precaution Comments: watch bp Restrictions Weight Bearing Restrictions:  No LUE Weight Bearing: Weight bearing as tolerated LLE Weight Bearing: Weight bearing as tolerated      Mobility Bed Mobility Overal bed mobility: Needs Assistance Bed Mobility: Supine to Sit     Supine to sit: Min guard     General bed mobility comments: Assist for safety    Transfers Overall transfer level: Needs assistance Equipment used: Rolling walker (2 wheels) Transfers: Sit to/from Stand Sit to Stand: Min assist           General transfer comment: Assist for balance and to power up      Balance Overall balance assessment: Needs assistance Sitting-balance support: No upper extremity supported Sitting balance-Leahy Scale: Good     Standing balance support: Bilateral upper extremity supported, During functional activity Standing balance-Leahy Scale: Poor Standing balance comment: walker and min guard for static standing                           ADL either performed or assessed with clinical judgement   ADL Overall ADL's : Needs assistance/impaired Eating/Feeding: Set up;Sitting   Grooming: Min guard;Standing   Upper Body Bathing: Supervision/ safety;Sitting   Lower Body Bathing: Minimal assistance;Sitting/lateral leans;Sit to/from stand   Upper Body Dressing : Supervision/safety;Sitting   Lower Body Dressing: Minimal assistance;Sitting/lateral leans;Sit to/from stand   Toilet Transfer: Minimal assistance;Ambulation   Toileting- Clothing Manipulation and Hygiene: Minimal assistance;Sitting/lateral lean;Sit to/from stand       Functional mobility during ADLs: Min guard;Rolling walker (2 wheels) General ADL Comments: Requiring assist for stability     Vision Baseline Vision/History: 0 No visual deficits Ability to See in Adequate Light: 0 Adequate Patient Visual Report: No change from baseline Vision Assessment?: No apparent  visual deficits     Perception     Praxis      Pertinent Vitals/Pain Pain Assessment Pain Assessment:  No/denies pain     Hand Dominance Right   Extremity/Trunk Assessment Upper Extremity Assessment Upper Extremity Assessment: Generalized weakness;LUE deficits/detail LUE Deficits / Details: Recent Elbow fx LUE Sensation: WNL LUE Coordination: decreased gross motor   Lower Extremity Assessment Lower Extremity Assessment: Defer to PT evaluation   Cervical / Trunk Assessment Cervical / Trunk Assessment: Normal   Communication Communication Communication: No difficulties   Cognition Arousal/Alertness: Awake/alert Behavior During Therapy: WFL for tasks assessed/performed Overall Cognitive Status: Within Functional Limits for tasks assessed                                       General Comments  VSS on RA    Exercises     Shoulder Instructions      Home Living Family/patient expects to be discharged to:: Private residence Living Arrangements: Parent;Other relatives (sister) Available Help at Discharge: Family;Available 24 hours/day Type of Home: House Home Access: Stairs to enter [employer] of Steps: 7   Home Layout: Multi-level;1/2 bath on main level Alternate Level Stairs-Number of Steps: flight   Bathroom Shower/Tub: Teacher, early years/pre: Standard     Home Equipment: Conservation officer, nature (2 wheels);Hand held shower head;Grab bars - tub/shower;Tub bench   Additional Comments: lives with her sister and her mother who is 72      Prior Functioning/Environment Prior Level of Function : Needs assist       Physical Assist : Mobility (physical) Mobility (physical): Transfers;Gait   Mobility Comments: Using RW since recent hip fx. Was independent prior to 3 recent hospitalizations          OT Problem List: Decreased strength;Decreased activity tolerance;Impaired balance (sitting and/or standing);Decreased safety awareness;Cardiopulmonary status limiting activity;Impaired UE functional use      OT Treatment/Interventions:  Self-care/ADL training;DME and/or AE instruction;Patient/family education;Balance training;Therapeutic activities    OT Goals(Current goals can be found in the care plan section) Acute Rehab OT Goals Patient Stated Goal: To get his independence back OT Goal Formulation: With patient Time For Goal Achievement: 12/11/21 Potential to Achieve Goals: Good ADL Goals Pt Will Perform Grooming: with modified independence;standing Pt Will Perform Lower Body Bathing: with modified independence;sitting/lateral leans;sit to/from stand Pt Will Perform Lower Body Dressing: with modified independence;sitting/lateral leans;sit to/from stand Pt Will Transfer to Toilet: with modified independence;ambulating Pt Will Perform Toileting - Clothing Manipulation and hygiene: with modified independence;sitting/lateral leans;sit to/from stand  OT Frequency: Min 2X/week    Co-evaluation              AM-PAC OT "6 Clicks" Daily Activity     Outcome Measure Help from another person eating meals?: A Little Help from another person taking care of personal grooming?: A Little Help from another person toileting, which includes using toliet, bedpan, or urinal?: A Little Help from another person bathing (including washing, rinsing, drying)?: A Little Help from another person to put on and taking off regular upper body clothing?: A Little Help from another person to put on and taking off regular lower body clothing?: A Little 6 Click Score: 18   End of Session Equipment Utilized During Treatment: Rolling walker (2 wheels) Nurse Communication: Mobility status  Activity Tolerance: Patient tolerated treatment well Patient left: in chair;with call bell/phone within reach  OT Visit Diagnosis:  Unsteadiness on feet (R26.81);Other abnormalities of gait and mobility (R26.89);Muscle weakness (generalized) (M62.81)                Time: 1947-1252 OT Time Calculation (min): 30 min Charges:  OT General Charges $OT Visit: 1  Visit OT Evaluation $OT Eval Moderate Complexity: 1 Mod OT Treatments $Therapeutic Activity: 8-22 mins  Jerrell Hart H., OTR/L Acute Rehabilitation  Lukis Bunt Elane Ranisha Allaire 11/27/2021, 6:22 PM

## 2021-11-27 NOTE — ED Notes (Signed)
ED TO INPATIENT HANDOFF REPORT   S Name/Age/Gender Laura Gentry 66 y.o. female Room/Bed: 038C/038C  Code Status   Code Status: Full Code  Home/SNF/Other Home Patient oriented to: self, place, time, and situation Is this baseline? Yes   Triage Complete: Triage complete  Chief Complaint Dizziness [R42]  Triage Note BIB [facility] from home due to dizziness and a fall. D/c yesterday for hypotensive. EMS arrival 90/p hr 180. No PO intake today, failure to thrive, lives alone, vomit episode enroute. A&Ox4. NS 759m bolus, Adenisone '6mg'$  100hr, IV 22ga lt wrist Zofran '4mg'$    Allergies Allergies  Allergen Reactions   Amoxicillin Anaphylaxis and Shortness Of Breath   Bee Venom Shortness Of Breath   Penicillins Anaphylaxis, Swelling and Other (See Comments)    Remote reaction- Throat swelling - compromised airway Tolerates ceftriaxone   Pneumococcal Vaccines Other (See Comments)    Per patient due to Brain Aneurysm   Sulfa Antibiotics Hives, Itching and Swelling   Bactrim [Sulfamethoxazole-Trimethoprim] Hives, Itching, Swelling and Other (See Comments)    Felt off balance    Level of Care/Admitting Diagnosis ED Disposition     ED Disposition  Admit   Condition  --   Comment  [id number]  Level of Care: Telemetry Medical [104]  [facility] level of care is available:: Yes  Covid Evaluation: Asymptomatic - no recent exposure (last 10 days) testing not required  Diagnosis: Dizziness [[[id number]] Admitting Physician: HKayleen Memos[[[id number] Attending Physician: TEugenie Filler[[[id number] Certification:: I certify this patient will need inpatient services for at least 2 midnights  Estimated Length of Stay: 1          B Medical/Surgery History Past Medical History:  Diagnosis Date   Brain aneurysm 03/19/2013   Cataracts, bilateral    Cerebral aneurysm    Colon polyps     Diabetes ([place] 2013   High cholesterol    History of kidney stones    Insomnia    Lumbosacral radiculopathy    Osteoporosis    Skin cancer    squamous   Thyroid disease    Tremor    Past Surgical History:  Procedure Laterality Date   APPENDECTOMY     BRAIN SURGERY  03/19/13   [place] aneyrsum clip can have MRI up to 4T docs scanned in    CHOLECYSTECTOMY  2013   [place] URETEROSCOPY AND [place]ENT PLACEMENT Bilateral 10/29/2021   Procedure: [place] URETEROSCOPY AND STENT PLACEMENT;  Surgeon: MAlexis Frock MD;  Location: [facility];  Service: Urology;  Laterality: Bilateral;   CYSTOSCOPY WITH RETROGRADE PYELOGRAM, URETEROSCOPY AND STENT PLACEMENT Bilateral 11/19/2021   Procedure: CYSTOSCOPY WITH RETROGRADE PYELOGRAM, URETEROSCOPY AND STENT EXCHANGE, BASKET STONE REMOVAL;  Surgeon: MAlexis Frock MD;  Location: [facility];  Service: Urology;  Laterality: Bilateral;  75 MINS   HIP PINNING,CANNULATED Left 11/04/2021   Procedure: CANNULATED HIP PINNING;  Surgeon: BVanetta Mulders MD;  [facility]  Service: Orthopedics;  Laterality: Left;   HOLMIUM LASER APPLICATION Bilateral 69/92/4268  Procedure: HOLMIUM LASER APPLICATION;  Surgeon: MAlexis Frock MD;  Location: [facility];  Service: Urology;  Laterality: Bilateral;   INNER EAR SURGERY     Lost Hearing   TONSILLECTOMY AND ADENOIDECTOMY     VAGINAL HYSTERECTOMY     At age [age]due to "growth"     A IV Location/Drains/Wounds Patient Lines/Drains/Airways Status  Active Line/Drains/Airways     Name Placement date Placement time Site Days   Peripheral IV 11/26/21 22 G Anterior;Left Wrist 11/26/21  1916  Wrist  1   Peripheral IV 11/26/21 20 G Anterior;Left Forearm 11/26/21  2232  Forearm  1   Ureteral Drain/Stent Left ureter 5 Fr. 11/19/21  1458  Left ureter  8   Incision (Closed) 11/04/21 Hip Left 11/04/21  1615  -- 23            Intake/Output Last 24 hours  Intake/Output  Summary (Last 24 hours) at 11/27/2021 1646 Last data filed at 11/27/2021 0747 Gross per 24 hour  Intake 2198.07 ml  Output 900 ml  Net 1298.07 ml    Labs/Imaging Results for orders placed or performed during the hospital encounter of 11/26/21 (from the past 48 hour(s))  Comprehensive metabolic panel     Status: Abnormal   Collection Time: 11/26/21  8:42 PM  Result Value Ref Range   Sodium 138 135 - 145 mmol/L   Potassium 3.8 3.5 - 5.1 mmol/L   Chloride 101 98 - 111 mmol/L   CO2 23 22 - 32 mmol/L   Glucose, Bld 220 (H) 70 - 99 mg/dL    Comment: Glucose reference range applies only to samples taken after fasting for at least 8 hours.   BUN 8 8 - 23 mg/dL   Creatinine, Ser 0.75 0.44 - 1.00 mg/dL   Calcium 7.9 (L) 8.9 - 10.3 mg/dL   Total Protein 5.6 (L) 6.5 - 8.1 g/dL   Albumin 1.9 (L) 3.5 - 5.0 g/dL   AST 17 15 - 41 U/L   ALT 26 0 - 44 U/L   Alkaline Phosphatase 106 38 - 126 U/L   Total Bilirubin 0.8 0.3 - 1.2 mg/dL   GFR, Estimated >60 >60 mL/min    Comment: (NOTE) Calculated using the CKD-EPI Creatinine Equation (2021)    Anion gap 14 5 - 15    Comment: Performed at [facility], [place] [address] [place], [place] [address]  CBC with Differential     Status: Abnormal   Collection Time: 11/26/21  8:42 PM  Result Value Ref Range   WBC 11.5 (H) 4.0 - 10.5 K/uL   RBC 3.34 (L) 3.87 - 5.11 MIL/uL   Hemoglobin 7.9 (L) 12.0 - 15.0 g/dL   HCT 26.2 (L) 36.0 - 46.0 %   MCV 78.4 (L) 80.0 - 100.0 fL   MCH 23.7 (L) 26.0 - 34.0 pg   MCHC 30.2 30.0 - 36.0 g/dL   RDW 14.2 11.5 - 15.5 %   Platelets 370 150 - 400 K/uL   nRBC 0.0 0.0 - 0.2 %   Neutrophils Relative % 74 %   Neutro Abs 8.5 (H) 1.7 - 7.7 K/uL   Lymphocytes Relative 14 %   Lymphs Abs 1.7 0.7 - 4.0 K/uL   Monocytes Relative 7 %   Monocytes Absolute 0.8 0.1 - 1.0 K/uL   Eosinophils Relative 3 %   Eosinophils Absolute 0.3 0.0 - 0.5 K/uL   Basophils Relative 0 %   Basophils Absolute 0.0 0.0 - 0.1 K/uL   Immature  Granulocytes 2 %   Abs Immature Granulocytes 0.17 (H) 0.00 - 0.07 K/uL    Comment: Performed at [facility], [address] [place], [place] [address]  POC occult blood, ED Provider will collect     Status: None   Collection Time: 11/26/21  9:43 PM  Result Value Ref Range  Fecal Occult Bld NEGATIVE NEGATIVE  Prepare RBC (crossmatch)     Status: None   Collection Time: 11/26/21 10:30 PM  Result Value Ref Range   Order Confirmation      ORDER PROCESSED BY BLOOD BANK Performed at [facility], [place] [address] [place], [place] [address]   Magnesium     Status: Abnormal   Collection Time: 11/26/21 10:35 PM  Result Value Ref Range   Magnesium 1.6 (L) 1.7 - 2.4 mg/dL    Comment: Performed at [facility] [address] [place], [place] [address]  Type and screen [place]     Status: None   Collection Time: 11/26/21 10:35 PM  Result Value Ref Range   ABO/RH(D) O POS    Antibody Screen NEG    Sample Expiration 11/29/2021,2359    Unit Number [id number]    Blood Component Type RBC LR PHER2    Unit division 00    Status of Unit ISSUED,FINAL    Transfusion Status OK TO TRANSFUSE    Crossmatch Result      Compatible Performed at [facility], [place] [address], [place], [place] [address]   Urinalysis, Routine w reflex microscopic Urine, Clean Catch     Status: Abnormal   Collection Time: 11/26/21 11:28 PM  Result Value Ref Range   Color, Urine YELLOW YELLOW   APPearance HAZY (A) CLEAR   Specific Gravity, Urine 1.010 1.005 - 1.030   pH 6.0 5.0 - 8.0   Glucose, UA 50 (A) NEGATIVE mg/dL   Hgb urine dipstick NEGATIVE NEGATIVE   Bilirubin Urine NEGATIVE NEGATIVE   Ketones, ur 20 (A) NEGATIVE mg/dL   Protein, ur 30 (A) NEGATIVE mg/dL   Nitrite NEGATIVE NEGATIVE   Leukocytes,Ua TRACE (A) NEGATIVE   RBC / HPF 0-5 0 - 5 RBC/hpf   WBC, UA 6-10 0 - 5 WBC/hpf   Bacteria, UA RARE (A) NONE SEEN   Squamous Epithelial / LPF 0-5 0 - 5   Mucus  PRESENT    Hyaline Casts, UA PRESENT     Comment: Performed at [facility], [place] [address], [place],  [address]  CBG monitoring, ED     Status: Abnormal   Collection Time: 11/27/21  1:48 AM  Result Value Ref Range   Glucose-Capillary 174 (H) 70 - 99 mg/dL    Comment: Glucose reference range applies only to samples taken after fasting for at least 8 hours.  CBC with Differential/Platelet     Status: Abnormal   Collection Time: 11/27/21  4:12 AM  Result Value Ref Range   WBC 10.4 4.0 - 10.5 K/uL   RBC 3.77 (L) 3.87 - 5.11 MIL/uL   Hemoglobin 9.4 (L) 12.0 - 15.0 g/dL   HCT 29.2 (L) 36.0 - 46.0 %   MCV 77.5 (L) 80.0 - 100.0 fL   MCH 24.9 (L) 26.0 - 34.0 pg   MCHC 32.2 30.0 - 36.0 g/dL   RDW 14.7 11.5 - 15.5 %   Platelets 385 150 - 400 K/uL   nRBC 0.0 0.0 - 0.2 %   Neutrophils Relative % 61 %   Neutro Abs 6.4 1.7 - 7.7 K/uL   Lymphocytes Relative 25 %   Lymphs Abs 2.6 0.7 - 4.0 K/uL   Monocytes Relative 9 %   Monocytes Absolute 1.0 0.1 - 1.0 K/uL   Eosinophils Relative 3 %   Eosinophils Absolute 0.3 0.0 - 0.5 K/uL   Basophils Relative 1 %  Basophils Absolute 0.1 0.0 - 0.1 K/uL   Immature Granulocytes 1 %   Abs Immature Granulocytes 0.11 (H) 0.00 - 0.07 K/uL    Comment: Performed at [facility], [place] [address], [place], [place] [address]  Comprehensive metabolic panel     Status: Abnormal   Collection Time: 11/27/21  4:12 AM  Result Value Ref Range   Sodium 139 135 - 145 mmol/L   Potassium 3.3 (L) 3.5 - 5.1 mmol/L   Chloride 105 98 - 111 mmol/L   CO2 22 22 - 32 mmol/L   Glucose, Bld 153 (H) 70 - 99 mg/dL    Comment: Glucose reference range applies only to samples taken after fasting for at least 8 hours.   BUN 5 (L) 8 - 23 mg/dL   Creatinine, Ser 0.72 0.44 - 1.00 mg/dL   Calcium 7.9 (L) 8.9 - 10.3 mg/dL   Total Protein 5.7 (L) 6.5 - 8.1 g/dL   Albumin 2.1 (L) 3.5 - 5.0 g/dL   AST 16 15 - 41 U/L   ALT 26 0 - 44 U/L   Alkaline Phosphatase 101 38 - 126  U/L   Total Bilirubin 0.6 0.3 - 1.2 mg/dL   GFR, Estimated >60 >60 mL/min    Comment: (NOTE) Calculated using the CKD-EPI Creatinine Equation (2021)    Anion gap 12 5 - 15    Comment: Performed at [facility], [place] [address] [place], [place] [address]  Magnesium     Status: Abnormal   Collection Time: 11/27/21  4:12 AM  Result Value Ref Range   Magnesium 1.5 (L) 1.7 - 2.4 mg/dL    Comment: Performed at [facility] [address] [place], [place] [address]  Phosphorus     Status: None   Collection Time: 11/27/21  4:12 AM  Result Value Ref Range   Phosphorus 3.6 2.5 - 4.6 mg/dL    Comment: Performed at [facility] [address], [place], [place] [address]  Iron and TIBC     Status: Abnormal   Collection Time: 11/27/21  4:12 AM  Result Value Ref Range   Iron 24 (L) 28 - 170 ug/dL   TIBC 211 (L) 250 - 450 ug/dL   Saturation Ratios 11 10.4 - 31.8 %   UIBC 187 ug/dL    Comment: Performed at [facility], [place] [address], [place], [place] [address]  Ferritin     Status: None   Collection Time: 11/27/21  4:12 AM  Result Value Ref Range   Ferritin 91 11 - 307 ng/mL    Comment: Performed at [facility] [address], [place],  [address]  CBG monitoring, ED     Status: Abnormal   Collection Time: 11/27/21  8:18 AM  Result Value Ref Range   Glucose-Capillary 155 (H) 70 - 99 mg/dL    Comment: Glucose reference range applies only to samples taken after fasting for at least 8 hours.  CBG monitoring, ED     Status: Abnormal   Collection Time: 11/27/21 12:49 PM  Result Value Ref Range   Glucose-Capillary 141 (H) 70 - 99 mg/dL    Comment: Glucose reference range applies only to samples taken after fasting for at least 8 hours.   CT Head Wo Contrast  Result Date: 11/26/2021 CLINICAL DATA:  Altered mental status and dizziness, initial encounter EXAM: CT HEAD WITHOUT CONTRAST TECHNIQUE: Contiguous axial images were obtained from the base of the  skull through the vertex without intravenous contrast. RADIATION DOSE REDUCTION: This exam was performed according to the departmental dose-optimization program which includes automated exposure control, adjustment of the mA and/or kV according to patient size and/or use of iterative reconstruction technique. COMPARISON:  11/21/2021 FINDINGS: Brain: No evidence of acute infarction, hemorrhage, hydrocephalus, extra-axial collection or mass lesion/mass effect. Mild atrophic changes are noted. Vascular: Aneurysm clip is again noted in the anterior aspect of the right temporal lobe Skull: Postsurgical changes are noted similar to that seen on the prior exam. No acute calvarial abnormality is noted. Sinuses/Orbits: No acute finding. Other: None. IMPRESSION: Chronic changes without acute abnormality. Electronically Signed   By: Inez Catalina M.D.   On: 11/26/2021 23:58   DG Pelvis Portable  Result Date: 11/26/2021 CLINICAL DATA:  Fall. EXAM: PORTABLE PELVIS 1-2 VIEWS COMPARISON:  11/14/2021 FINDINGS: There is no evidence of pelvic fracture or diastasis. There is no dislocation at the hips. Fixation hardware is noted in the proximal left femur without evidence of hardware loosening. No pelvic bone lesions are seen. IMPRESSION: No acute fracture or dislocation. Electronically Signed   By: Brett Fairy M.D.   On: 11/26/2021 22:15   DG Chest Portable 1 View  Result Date: 11/26/2021 CLINICAL DATA:  Dizziness, fell, hypotension EXAM: PORTABLE CHEST 1 VIEW COMPARISON:  11/21/2021 FINDINGS: Single frontal view of the chest demonstrates an unremarkable cardiac silhouette. No acute airspace disease. Trace left pleural effusion versus pleural thickening. No pneumothorax. No acute displaced fracture. IMPRESSION: 1. Trace left pleural effusion versus pleural thickening. 2. Otherwise unremarkable exam. Electronically Signed   By: Randa Ngo M.D.   On: 11/26/2021 20:44    Pending Labs Unresulted Labs (From admission,  onward)     Start     Ordered   11/28/21 0500  Magnesium  Tomorrow morning,   R        11/27/21 0916            Vitals/Pain Today's Vitals   11/27/21 1000 11/27/21 1100 11/27/21 1239 11/27/21 1256  BP: (!) 168/94 (!) 149/89    Pulse: 84 90    Resp: 16 (!) 22    Temp:      TempSrc:      SpO2: 98% 97%    Weight:      Height:      PainSc:   0-No pain 0-No pain    Isolation Precautions No active isolations  Medications Medications  insulin aspart (novoLOG) injection 0-15 Units (2 Units Subcutaneous Given 11/27/21 1254)  insulin aspart (novoLOG) injection 0-5 Units ( Subcutaneous Not Given 11/27/21 0149)  atorvastatin (LIPITOR) tablet 40 mg (40 mg Oral Given 11/27/21 1056)  DULoxetine (CYMBALTA) DR capsule 60 mg (60 mg Oral Given 11/27/21 1056)  pantoprazole (PROTONIX) EC tablet 40 mg (40 mg Oral Given 11/27/21 1056)  vitamin B-12 (CYANOCOBALAMIN) tablet 1,000 mcg (1,000 mcg Oral Given 11/27/21 1056)  traZODone (DESYREL) tablet 50 mg (has no administration in time range)  acetaminophen (TYLENOL) tablet 650 mg (has no administration in time range)  ondansetron (ZOFRAN) injection 4 mg (4 mg Intravenous Given 11/27/21 0530)  polyethylene glycol (MIRALAX / GLYCOLAX) packet 17 g (has no administration in time range)  lactated ringers infusion ( Intravenous New Bag/Given 11/27/21 0418)  aspirin tablet 325 mg (325 mg Oral Given 11/27/21 1056)  propranolol (INDERAL) tablet 40 mg (40 mg Oral Given 11/27/21 1157)  gabapentin (NEURONTIN) capsule 300 mg (has no administration in time range)    And  gabapentin (NEURONTIN) capsule  300 mg (300 mg Oral Given 11/27/21 1100)    And  gabapentin (NEURONTIN) capsule 600 mg (has no administration in time range)  sodium chloride 0.9 % bolus 1,000 mL (0 mLs Intravenous Stopped 11/26/21 2121)  ondansetron (ZOFRAN) injection 4 mg (4 mg Intravenous Given 11/26/21 2248)  sodium chloride 0.9 % bolus 1,000 mL (0 mLs Intravenous Stopped 11/27/21 0028)  magnesium sulfate IVPB 2  g 50 mL (0 g Intravenous Stopped 11/27/21 0517)  magnesium sulfate IVPB 4 g 100 mL (0 g Intravenous Stopped 11/27/21 1641)  potassium chloride (KLOR-CON M) CR tablet 40 mEq (40 mEq Oral Given 11/27/21 1056)    Mobility walks Moderate fall risk   Focused Assessments Cardiac Assessment Handoff:  Cardiac Rhythm: Normal sinus rhythm Lab Results  Component Value Date   CKTOTAL 20 (L) 11/21/2021   Lab Results  Component Value Date   DDIMER 0.27 07/10/2013   Does the Patient currently have chest pain? No    R Recommendations: See Admitting Provider Note

## 2021-11-27 NOTE — ED Notes (Addendum)
Pt placed on 2L [place] d/t O2 sats being 86% RA. Pt appears to be in no distress. O2 sats 95% on 2L.

## 2021-11-27 NOTE — H&P (Addendum)
History and Physical  Chris Narasimhan NVB:[id number] DOB: [birth year] DOA: 11/26/2021  Referring physician: Dr. Karle Starch, [place]  PCP: Seward Carol, MD  Outpatient Specialists: None Patient coming from: Home  Chief Complaint: Dizziness and fall  HPI: Laura Gentry is a 66 y.o. female with medical history significant for type 2 diabetes, hyperlipidemia, essential hypertension, recently diagnosed and admitted for sepsis secondary to obstructing renal stone, left hip fracture post mechanical fall requiring surgical intervention who presents to [facility] ED with complaints of dizziness and a reported fall.  Denies hitting her head.  No reported fevers.  Work-up in the ED reveals drop in hemoglobin to 7.9 from baseline of 10.  1 U PRBCs ordered by EDP to be transfused.  EDP requested admission due to concern for symptomatic anemia.  The patient was admitted by the hospitalist service, TR[facility].  ED Course: Tmax 98.1.  BP 157/76.  Pulse 88.  Respiratory 20, O2 saturation 96% on room air.  Lab studies remarkable for WBC 11.5.  Hemoglobin 7.9.  MCV 78.  UA positive for pyuria.  Review of Systems: Review of systems as noted in the HPI. All other systems reviewed and are negative.   Past Medical History:  Diagnosis Date   Brain aneurysm 03/19/2013   Cataracts, bilateral    Cerebral aneurysm    Colon polyps    Diabetes ([place]) 2013   High cholesterol    History of kidney stones    Insomnia    Lumbosacral radiculopathy    Osteoporosis    Skin cancer    squamous   Thyroid disease    Tremor    Past Surgical History:  Procedure Laterality Date   APPENDECTOMY     BRAIN SURGERY  03/19/13   [place] aneyrsum clip can have MRI up to [place] scanned in    CHOLECYSTECTOMY  2013   [place], URETEROSCOPY AND STENT PLACEMENT Bilateral 10/29/2021   Procedure: [place], URETEROSCOPY AND STENT PLACEMENT;  Surgeon: Alexis Frock, MD;  Location: [facility];   Service: Urology;  Laterality: Bilateral;   CYSTOSCOPY WITH RETROGRADE PYELOGRAM, URETEROSCOPY AND STENT PLACEMENT Bilateral 11/19/2021   Procedure: CYSTOSCOPY WITH RETROGRADE PYELOGRAM, URETEROSCOPY AND STENT EXCHANGE, BASKET STONE REMOVAL;  Surgeon: Alexis Frock, MD;  Location: [facility];  Service: Urology;  Laterality: Bilateral;  75 MINS   HIP PINNING,CANNULATED Left 11/04/2021   Procedure: CANNULATED HIP PINNING;  Surgeon: Vanetta Mulders, MD;  Location: [place];  Service: Orthopedics;  Laterality: Left;   HOLMIUM LASER APPLICATION Bilateral 9/97/7414   Procedure: HOLMIUM LASER APPLICATION;  Surgeon: Alexis Frock, MD;  Location: [facility];  Service: Urology;  Laterality: Bilateral;   INNER EAR SURGERY     Lost Hearing   TONSILLECTOMY AND ADENOIDECTOMY     VAGINAL HYSTERECTOMY     At age 79 due to "growth"    Social History:  reports that she quit smoking about 10 years ago. Her smoking use included cigarettes. She has a 20.00 pack-year smoking history. She has never used smokeless tobacco. She reports that she does not drink alcohol and does not use drugs.   Allergies  Allergen Reactions   Amoxicillin Anaphylaxis and Shortness Of Breath   Bee Venom Shortness Of Breath   Penicillins Anaphylaxis, Swelling and Other (See Comments)    Remote reaction- Throat swelling - compromised airway Tolerates ceftriaxone   Pneumococcal Vaccines Other (See Comments)    Per patient due to Brain Aneurysm   Sulfa Antibiotics Hives, Itching and Swelling  Bactrim [Sulfamethoxazole-Trimethoprim] Hives, Itching, Swelling and Other (See Comments)    Felt off balance    Family History  Problem Relation Age of Onset   Asthma Mother    Allergies Mother    Diabetes Mother    Lung cancer Father        smoked   Throat cancer Father        smoked   Pancreatic cancer Brother    Hypertension Maternal Grandfather    Diabetes Maternal Grandfather    Emphysema Maternal Aunt        never smoked, spouse did    Breast cancer Maternal Aunt    Breast cancer Maternal Aunt    Breast cancer Paternal Aunt       Prior to Admission medications   Medication Sig Start Date End Date Taking? Authorizing Provider  acetaminophen (TYLENOL) 500 MG tablet Take 1,000 mg by mouth every 6 (six) hours as needed for mild pain or headache.   Yes [provider]  alendronate (FOSAMAX) 70 MG tablet Take 70 mg by mouth every Monday. 09/27/21  Yes [provider]  aspirin 325 MG tablet Take 1 tablet (325 mg total) by mouth daily for 28 days. 11/08/21 12/06/21 Yes Dahal, Marlowe Aschoff, MD  atorvastatin (LIPITOR) 40 MG tablet Take 1 tablet (40 mg total) by mouth daily. 01/31/18  Yes Lorella Nimrod, MD  DULoxetine (CYMBALTA) 60 MG capsule Take 60 mg by mouth daily.   Yes [provider]  gabapentin (NEURONTIN) 300 MG capsule Take 300 mg in AM, 300 mg at noon, 600 mg at bedtime Patient taking differently: Take 300-600 mg by mouth See admin instructions. Take 300 mg by mouth in the morning, 300 mg at noon and 600 mg at bedtime 09/21/16  Yes Rivet, Carly J, MD  insulin aspart (NOVOLOG) 100 UNIT/ML FlexPen Before each meal 3 times a day, 140-199 - 2 units, 200-250 - 4 units, 251-299 - 6 units,  300-349 - 8 units,  350 or above 10 units. 11/24/21  Yes Thurnell Lose, MD  insulin glargine (LANTUS SOLOSTAR) 100 UNIT/ML Solostar Pen Inject 32 Units into the skin at bedtime. 11/24/21  Yes Thurnell Lose, MD  midodrine (PROAMATINE) 5 MG tablet Take 1 tablet (5 mg total) by mouth 2 (two) times daily with a meal. 11/25/21  Yes Thurnell Lose, MD  pantoprazole (PROTONIX) 40 MG tablet Take 1 tablet (40 mg total) by mouth daily. 11/07/21 12/07/21 Yes Dahal, Marlowe Aschoff, MD  propranolol ER (INDERAL LA) 120 MG 24 hr capsule Take 40 mg by mouth in the morning, at noon, and at bedtime. 09/17/15  Yes [provider]  traZODone (DESYREL) 50 MG tablet Take 1 tablet (50 mg total) by mouth at bedtime as needed for sleep. 11/01/21  Yes  Allie Bossier, MD  vitamin B-12 (CYANOCOBALAMIN) 1000 MCG tablet Take 1,000 mcg by mouth daily.   Yes [provider]  Blood Glucose Monitoring Suppl (ACCU-CHEK AVIVA PLUS) w/Device KIT The patient IS insulin requiring, ICD 10 code E11.9. The patient tests 4 times per day. 06/04/17   Lenore Cordia, MD  cephALEXin (KEFLEX) 500 MG capsule Take 1 capsule (500 mg total) by mouth 3 (three) times daily for 7 days. Patient not taking: Reported on 11/26/2021 11/24/21 12/01/21  Thurnell Lose, MD  glucose blood (ACCU-CHEK AVIVA PLUS) test strip Use as instructed. The patient IS insulin requiring, ICD 10 code E11.9. The patient tests 4 times per day. 09/23/16   Rivet, Sindy Guadeloupe, MD  Insulin Pen Needle 32G X 4 MM MISC Use to inject lantus once dialy 10/29/17   Lorella Nimrod, MD  Lancets (ACCU-CHEK SOFT TOUCH) lancets Use as instructed 09/23/16   Rivet, Sindy Guadeloupe, MD    Physical Exam: BP (!) 157/76   Pulse 88   Temp 98.1 F (36.7 C) (Oral)   Resp 20   Ht _0  (1.702 m)   Wt 70.3 kg   SpO2 96%   BMI 24.28 kg/m   General: 66 y.o. year-old female well developed well nourished in no acute distress.  Alert and oriented x3. Cardiovascular: Regular rate and rhythm with no rubs or gallops.  No thyromegaly or JVD noted.  No lower extremity edema. 2/4 pulses in all 4 extremities. Respiratory: Clear to auscultation with no wheezes or rales. Good inspiratory effort. Abdomen: Soft nontender nondistended with normal bowel sounds x4 quadrants. Muskuloskeletal: No cyanosis, clubbing or edema noted bilaterally Neuro: CN II-XII intact, strength, sensation, reflexes Skin: No ulcerative lesions noted or rashes Psychiatry: Judgement and insight appear normal. Mood is appropriate for condition and setting          Labs on Admission:  Basic Metabolic Panel: Recent Labs  Lab 11/21/21 2125 11/22/21 0143 11/22/21 0416 11/23/21 0225 11/24/21 0048 11/25/21 0114 11/26/21 2042 11/26/21 2235  NA 133*   < > 137  135 135 137 138  --   K 3.0*   < > 3.3* 3.2* 3.5 3.5 3.8  --   CL 100   < > 102 105 100 99 101  --   CO2 24   < > _1 --   GLUCOSE 155*   < > 168* 145* 185* 149* 220*  --   BUN 12   < > 10 9 6* 6* 8  --   CREATININE 0.74   < > 0.75 0.97 0.79 0.92 0.75  --   CALCIUM 8.4*   < > 8.7* 7.9* 8.1* 8.2* 7.9*  --   MG 1.4*  --   --  2.3 1.5* 1.9  --  1.6*   < > = values in this interval not displayed.   Liver Function Tests: Recent Labs  Lab 11/21/21 1310 11/26/21 2042  AST 17 17  ALT 14 26  ALKPHOS 104 106  BILITOT 1.1 0.8  PROT 6.8 5.6*  ALBUMIN 3.0* 1.9*   No results for input(s): "LIPASE", "AMYLASE" in the last 168 hours. No results for input(s): "AMMONIA" in the last 168 hours. CBC: Recent Labs  Lab 11/21/21 1310 11/21/21 1345 11/23/21 0225 11/23/21 0710 11/24/21 0048 11/25/21 0114 11/26/21 2042  WBC 8.7  --  9.0 11.2* 10.0 11.2* 11.5*  NEUTROABS 7.4  --   --   --  7.0 7.9* 8.5*  HGB 11.6*   < > 8.5* 10.2* 9.2* 8.7* 7.9*  HCT 38.3   < > 26.7* 33.0* 29.0* 28.3* 26.2*  MCV 82.4  --  77.2* 77.6* 75.3* 76.5* 78.4*  PLT 300  --  241 307 292 317 370   < > = values in this interval not displayed.   Cardiac Enzymes: Recent Labs  Lab 11/21/21 1310  CKTOTAL 20*    BNP (last 3 results) No results for input(s): "BNP" in the last 8760 hours.  ProBNP (last 3 results) No results for input(s): "PROBNP" in the last 8760 hours.  CBG: Recent Labs  Lab 11/24/21 1156 11/24/21 1632 11/24/21 2136 11/25/21 0828 11/25/21 1151  GLUCAP 164* 170* 159* 157* 158*  Radiological Exams on Admission: CT Head Wo Contrast  Result Date: 11/26/2021 CLINICAL DATA:  Altered mental status and dizziness, initial encounter EXAM: CT HEAD WITHOUT CONTRAST TECHNIQUE: Contiguous axial images were obtained from the base of the skull through the vertex without intravenous contrast. RADIATION DOSE REDUCTION: This exam was performed according to the departmental dose-optimization program  which includes automated exposure control, adjustment of the mA and/or kV according to patient size and/or use of iterative reconstruction technique. COMPARISON:  11/21/2021 FINDINGS: Brain: No evidence of acute infarction, hemorrhage, hydrocephalus, extra-axial collection or mass lesion/mass effect. Mild atrophic changes are noted. Vascular: Aneurysm clip is again noted in the anterior aspect of the right temporal lobe Skull: Postsurgical changes are noted similar to that seen on the prior exam. No acute calvarial abnormality is noted. Sinuses/Orbits: No acute finding. Other: None. IMPRESSION: Chronic changes without acute abnormality. Electronically Signed   By: Inez Catalina M.D.   On: 11/26/2021 23:58   DG Pelvis Portable  Result Date: 11/26/2021 CLINICAL DATA:  Fall. EXAM: PORTABLE PELVIS 1-2 VIEWS COMPARISON:  11/14/2021 FINDINGS: There is no evidence of pelvic fracture or diastasis. There is no dislocation at the hips. Fixation hardware is noted in the proximal left femur without evidence of hardware loosening. No pelvic bone lesions are seen. IMPRESSION: No acute fracture or dislocation. Electronically Signed   By: Brett Fairy M.D.   On: 11/26/2021 22:15   DG Chest Portable 1 View  Result Date: 11/26/2021 CLINICAL DATA:  Dizziness, fell, hypotension EXAM: PORTABLE CHEST 1 VIEW COMPARISON:  11/21/2021 FINDINGS: Single frontal view of the chest demonstrates an unremarkable cardiac silhouette. No acute airspace disease. Trace left pleural effusion versus pleural thickening. No pneumothorax. No acute displaced fracture. IMPRESSION: 1. Trace left pleural effusion versus pleural thickening. 2. Otherwise unremarkable exam. Electronically Signed   By: Randa Ngo M.D.   On: 11/26/2021 20:44    EKG: I independently viewed the EKG done and my findings are as followed: Sinus tachycardia rate of 102.  Nonspecific ST-T changes.  QTc 460.  Assessment/Plan Present on Admission: **None**  Principal  Problem:   Dizziness  Dizziness, unclear etiology, possibly from symptomatic anemia versus hypovolemia, intravascular depletion. Hemoglobin 7.9 on presentation Baseline hemoglobin 10. No overt bleeding reported 1U PRBCs Gentle IV fluid due to suspected hypovolemia Obtain orthostatic vital signs in the AM Repeat H&H Transfuse as needed PT OT assessment Fall precautions  Acute blood loss anemia Baseline hemoglobin appears to be 10 Presented with hemoglobin of 7.9 Obtain iron studies If iron deficiency is present, consider ruling out GI as a source of iron/blood loss.  Type 2 diabetes with hyperglycemia Last A1C 8.30 October 2021. Start insulin sliding scale  Hypomagnesemia Serum magnesium 1.6 Repleted intravenously. Repeat in the AM  History of orthostatic hypotension Continue home TED stockings and home midodrine  Recently treated sepsis due to complicated UTI/pyelonephritis, POA. No acute issues  Recently repaired left hip fracture As needed analgesics    DVT prophylaxis: SCDs due to possible GI blood loss  Code Status: Full code  Family Communication: None at bedside  Disposition Plan: Admitted to telemetry medical unit  Consults called: None.  Admission status: Observation    Status is: Observation    Kayleen Memos MD [facility] Pager [phone]  If 7PM-7AM, please contact night-coverage [email or web address] Password [email or web address]  11/27/2021, 12:48 AM

## 2021-11-28 DIAGNOSIS — D649 Anemia, unspecified: Principal | ICD-10-CM

## 2021-11-28 DIAGNOSIS — E119 Type 2 diabetes mellitus without complications: Secondary | ICD-10-CM

## 2021-11-28 DIAGNOSIS — D62 Acute posthemorrhagic anemia: Secondary | ICD-10-CM | POA: Diagnosis not present

## 2021-11-28 DIAGNOSIS — R42 Dizziness and giddiness: Secondary | ICD-10-CM | POA: Diagnosis not present

## 2021-11-28 DIAGNOSIS — I951 Orthostatic hypotension: Secondary | ICD-10-CM

## 2021-11-28 DIAGNOSIS — N390 Urinary tract infection, site not specified: Secondary | ICD-10-CM

## 2021-11-28 LAB — BASIC METABOLIC PANEL
Anion gap: 9 (ref 5–15)
BUN: <5 mg/dL — ABNORMAL LOW (ref 8–23)
CO2: 26 mmol/L (ref 22–32)
Calcium: 8.1 mg/dL — ABNORMAL LOW (ref 8.9–10.3)
Chloride: 104 mmol/L (ref 98–111)
Creatinine, Ser: 0.71 mg/dL (ref 0.44–1.00)
GFR, Estimated: >60 mL/min (ref >60)
Glucose, Bld: 182 mg/dL — ABNORMAL HIGH (ref 70–99)
Potassium: 3.8 mmol/L (ref 3.5–5.1)
Sodium: 139 mmol/L (ref 135–145)

## 2021-11-28 LAB — CBC
HCT: 31.4 % — ABNORMAL LOW (ref 36.0–46.0)
Hemoglobin: 10.1 g/dL — ABNORMAL LOW (ref 12.0–15.0)
MCH: 24.6 pg — ABNORMAL LOW (ref 26.0–34.0)
MCHC: 32.2 g/dL (ref 30.0–36.0)
MCV: 76.4 fL — ABNORMAL LOW (ref 80.0–100.0)
Platelets: 441 10*3/uL — ABNORMAL HIGH (ref 150–400)
RBC: 4.11 MIL/uL (ref 3.87–5.11)
RDW: 14.7 % (ref 11.5–15.5)
WBC: 10.4 10*3/uL (ref 4.0–10.5)
nRBC: 0 % (ref 0.0–0.2)

## 2021-11-28 LAB — GLUCOSE, CAPILLARY
Glucose-Capillary: 165 mg/dL — ABNORMAL HIGH (ref 70–99)
Glucose-Capillary: 151 mg/dL — ABNORMAL HIGH (ref 70–99)
Glucose-Capillary: 143 mg/dL — ABNORMAL HIGH (ref 70–99)
Glucose-Capillary: 195 mg/dL — ABNORMAL HIGH (ref 70–99)

## 2021-11-28 LAB — MAGNESIUM: Magnesium: 2.3 mg/dL (ref 1.7–2.4)

## 2021-11-28 NOTE — TOC Initial Note (Addendum)
Transition of Care [facility]) - Initial/Assessment Note    Patient Details  Name: Meshelle Holness MRN: [id number] Date of Birth: Jul 06, 1955  [facility] CM/SW Contact:    Pollie Friar, RN Phone Number: 11/28/2021, 2:03 PM  Clinical Narrative:                 Pt is from home with her sister. She has had a few recent admissions. She was active with [facility]ings for [facility] services. She is admitted with a fall. Recommendations are now for SNF rehab. CM met with the patient and she asked to be faxed out in the [facility] area.  CM will provide bed offers this pm and start insurance auth.  TOC following.  1542: pt provided SNF rehab  offers. She selected [facility]. They will have a bed on Monday. CM has asked the MOA to begin insurance authorization for admission.   Expected Discharge Plan: Skilled Nursing Facility Barriers to Discharge: Continued Medical Work up   Patient Goals and CMS Choice   CMS Medicare.gov Compare Post Acute Care list provided to:: Patient Choice offered to / list presented to : Patient  Expected Discharge Plan and Services Expected Discharge Plan: [place] Choice: [place] arrangements for the past 2 months: Single Family Home                                      Prior Living Arrangements/Services Living arrangements for the past 2 months: Single Family Home Lives with:: Siblings Patient language and need for interpreter reviewed:: Yes Do you feel safe going back to the place where you live?: Yes      Need for Family Participation in Patient Care: Yes (Comment) Care giver support system in place?: Yes (comment)   Criminal Activity/Legal Involvement Pertinent to Current Situation/Hospitalization: No - Comment as needed  Activities of Daily Living   ADL Screening (condition at time of admission) Patient's cognitive ability adequate to safely complete daily activities?: Yes Is  the patient deaf or have difficulty hearing?: No Does the patient have difficulty seeing, even when wearing glasses/contacts?: No Does the patient have difficulty concentrating, remembering, or making decisions?: No Patient able to express need for assistance with ADLs?: Yes Does the patient have difficulty dressing or bathing?: No  Permission Sought/Granted                  Emotional Assessment Appearance:: Appears stated age Attitude/Demeanor/Rapport: Engaged Affect (typically observed): Accepting Orientation: : Oriented to Self, Oriented to Place, Oriented to  Time, Oriented to Situation   Psych Involvement: No (comment)  Admission diagnosis:  Dizziness [R42] SVT (supraventricular tachycardia) (HCC) [I47.1] Symptomatic anemia [D64.9] Patient Active Problem List   Diagnosis Date Noted   Dizziness 54/65/6812   Acute metabolic encephalopathy 75/17/0017   Sepsis secondary to UTI ([place]) 11/21/2021   Acute urinary retention 11/04/2021   Hematuria 11/04/2021   Anemia, unspecified 11/03/2021   Hip fracture ([place]) 11/02/2021   Left elbow fracture 11/02/2021   S/p left hip fracture 11/02/2021   Essential hypertension 49/44/9675   Complicated UTI (urinary tract infection) 11/02/2021   Severe sepsis ([place]) 10/29/2021   DKA (diabetic ketoacidosis) ([place]) 10/29/2021   AKI (acute kidney injury) ([place]) 10/29/2021   Acquired hypothyroidism 10/29/2021   Ureteral obstruction, right 10/28/2021   Breast cancer screening by mammogram 01/31/2018  Need for immunization against influenza 01/31/2018   Elevated blood pressure reading 01/31/2018   Migraines 09/22/2016   Tremor of both hands 09/22/2016   History of colonic polyps 09/22/2016   HLD (hyperlipidemia) 03/07/2015   Difficulty sleeping 03/07/2015   Type 2 diabetes mellitus with peripheral neuropathy ([place]) 03/07/2015   Fatigue 03/07/2015   UTI (urinary tract infection) 03/07/2015   History of intracranial aneurysm 03/17/2013    History of depression 02/01/2013   Disorder of esophagus 08/19/2012   Calculus of kidney 09/22/2011   H/O malignant neoplasm of skin 09/22/2011   PCP:  Seward Carol, MD Pharmacy:   [facility] [phone] [address], [place] [address] AT [facility] [address] [place] [address] Phone: [phone] Fax: [phone]     Social Determinants of Health ([place]) Interventions    Readmission Risk Interventions    11/24/2021   11:10 AM  Readmission Risk Prevention Plan  Transportation Screening Complete  Home Care Screening Complete  Medication Review (RN CM) Complete

## 2021-11-28 NOTE — Progress Notes (Signed)
PROGRESS NOTE    Laura Gentry  MWU:[id number] DOB: 02/16/1956 DOA: 11/26/2021 PCP: Seward Carol, MD    Chief Complaint  Patient presents with   Dizziness   Fall    Brief Narrative: HPI per Dr. Mosie Epstein Laura Gentry is a 66 y.o. female with medical history significant for type 2 diabetes, hyperlipidemia, essential hypertension, recently diagnosed and admitted for sepsis secondary to obstructing renal stone, left hip fracture post mechanical fall requiring surgical intervention who presents to [facility] ED with complaints of dizziness and a reported fall.  Denies hitting her head.  No reported fevers.  Work-up in the ED reveals drop in hemoglobin to 7.9 from baseline of 10.  1 U PRBCs ordered by EDP to be transfused.  EDP requested admission due to concern for symptomatic anemia.  The patient was admitted by the hospitalist service, TRH.   ED Course: Tmax 98.1.  BP 157/76.  Pulse 88.  Respiratory 20, O2 saturation 96% on room air.  Lab studies remarkable for WBC 11.5.  Hemoglobin 7.9.  MCV 78.  UA positive for pyuria.  Assessment & Plan:   Principal Problem:   Dizziness Active Problems:   Symptomatic anemia   Acute blood loss anemia   Controlled type 2 diabetes mellitus without complication, without long-term current use of insulin (HCC)   Hypomagnesemia   Orthostatic hypotension  #1 dizziness, unclear etiology concern for symptomatic anemia versus hypovolemia, intravascular depletion versus orthostasis -Patient noted to have presented with dizziness, and a fall. -Concern for symptomatic anemia as hemoglobin noted at 7.9 on admission from 8.7 (11/25/2021) -Patient denies any overt bleeding. -Status posttransfusion 1 unit packed red blood cells 11/27/2021 with hemoglobin currently at 10.1 -Repeat orthostatics done on 11/27/2021, per PT with some drop in blood pressure however patient remained asymptomatic. -Patient noted with chronic hypotension noted on midodrine. -Saline lock IV  fluids.   -Continue home regimen midodrine.   2.  Acute blood loss anemia -Baseline hemoglobin approximately 10. -Hemoglobin noted at 7.9 on presentation however was 8.7 during recent discharge (11/25/2021). -Status post transfusion 1 unit packed red blood cells hemoglobin currently at 10.1. -Anemia panel ordered with iron level of 24, ferritin of 91, TIBC of 211. -Continue PPI -Follow H&H.  3.  Type 2 diabetes mellitus with hyperglycemia -Hemoglobin A1c 8.8 (10/29/2021). -CBG of 165 this morning -SSI.  4.  Hypomagnesemia -Repleted.   -Magnesium at 2.3.    5.  History of orthostatic hypotension -Continue TED hose. -Continue midodrine.  6.  History of recent sepsis due to complicated UTI/pyelonephritis, POA -From recent discharge summary patient noted to have been discharged on Keflex 500 3 times daily for 7 more days on discharge however patient stated pharmacy was not open on July 4 and has not had any antibiotics since discharge. -Continue Keflex 500 mg 3 times daily x6 days as noted on recent discharge summary.  7.  Status post recent left hip fracture with repair -PT/OT. -Pain management. -Will need SNF placement. -   DVT prophylaxis: Aspirin Code Status: Full Family Communication: Updated patient.  No family at bedside. Disposition: SNF  Status is: Inpatient The patient will require care spanning > 2 midnights and should be moved to inpatient because: Unsafe disposition   Consultants:  None  Procedures:  CT head without contrast 11/26/2021 Plan.  The pelvis 11/26/2021 Chest x-ray 11/26/2021   Antimicrobials:  Keflex 11/27/2021>>>>>   Subjective: Laying in bed.  Overall feeling better.  Denies any dizziness, no lightheadedness.  No chest  pain.  No shortness of breath.  States her mother is close to being transitioned to comfort measures and would like to be able to see her mother who is inpatient.    Objective: Vitals:   11/27/21 2039 11/28/21 0012 11/28/21 0534  11/28/21 0818  BP: (!) 170/90 (!) 145/76 (!) 147/94 (!) 162/83  Pulse: 82 78 75 79  Resp: '18 18 19 18  '$ Temp: 98 F (36.7 C) 99.1 F (37.3 C) 98.1 F (36.7 C) 97.8 F (36.6 C)  TempSrc: Oral Oral Oral   SpO2: 96% 94% 96% 96%  Weight:      Height:        Intake/Output Summary (Last 24 hours) at 11/28/2021 1517 Last data filed at 11/28/2021 0853 Gross per 24 hour  Intake 1428.93 ml  Output 800 ml  Net 628.93 ml   Filed Weights   11/26/21 2233  Weight: 70.3 kg    Examination:  General exam: NAD Respiratory system: CTA B.  No wheezes, no crackles, no rhonchi.  Normal respiratory effort.   Cardiovascular system: RRR no murmurs rubs or gallops.  No JVD.  No lower extremity edema.  Gastrointestinal system: Abdomen is nondistended, soft and nontender. No organomegaly or masses felt. Normal bowel sounds heard. Central nervous system: Alert and oriented. No focal neurological deficits. Extremities: Symmetric 5 x 5 power. Skin: No rashes, lesions or ulcers Psychiatry: Judgement and insight appear normal. Mood & affect appropriate.     Data Reviewed: I have personally reviewed following labs and imaging studies  CBC: Recent Labs  Lab 11/24/21 0048 11/25/21 0114 11/26/21 2042 11/27/21 0412 11/28/21 0143  WBC 10.0 11.2* 11.5* 10.4 10.4  NEUTROABS 7.0 7.9* 8.5* 6.4  --   HGB 9.2* 8.7* 7.9* 9.4* 10.1*  HCT 29.0* 28.3* 26.2* 29.2* 31.4*  MCV 75.3* 76.5* 78.4* 77.5* 76.4*  PLT 292 317 370 385 441*    Basic Metabolic Panel: Recent Labs  Lab 11/24/21 0048 11/25/21 0114 11/26/21 2042 11/26/21 2235 11/27/21 0412 11/28/21 0143  NA 135 137 138  --  139 139  K 3.5 3.5 3.8  --  3.3* 3.8  CL 100 99 101  --  105 104  CO2 '25 26 23  '$ --  22 26  GLUCOSE 185* 149* 220*  --  153* 182*  BUN 6* 6* 8  --  5* <5*  CREATININE 0.79 0.92 0.75  --  0.72 0.71  CALCIUM 8.1* 8.2* 7.9*  --  7.9* 8.1*  MG 1.5* 1.9  --  1.6* 1.5* 2.3  PHOS  --   --   --   --  3.6  --     GFR: Estimated  Creatinine Clearance: 67.3 mL/min (by C-G formula based on SCr of 0.71 mg/dL).  Liver Function Tests: Recent Labs  Lab 11/26/21 2042 11/27/21 0412  AST 17 16  ALT 26 26  ALKPHOS 106 101  BILITOT 0.8 0.6  PROT 5.6* 5.7*  ALBUMIN 1.9* 2.1*    CBG: Recent Labs  Lab 11/27/21 1249 11/27/21 1735 11/27/21 2052 11/28/21 0902 11/28/21 1227  GLUCAP 141* 214* 239* 165* 151*     Recent Results (from the past 240 hour(s))  Blood culture (routine x 2)     Status: None   Collection Time: 11/21/21  1:03 PM   Specimen: BLOOD RIGHT HAND  Result Value Ref Range Status   Specimen Description BLOOD RIGHT HAND  Final   Special Requests   Final    BOTTLES DRAWN AEROBIC AND ANAEROBIC Blood  Culture adequate volume   Culture   Final    NO GROWTH 5 DAYS Performed at [facility], [place] [address] [place], [place] [address]    Report Status 11/26/2021 FINAL  Final  Blood culture (routine x 2)     Status: None   Collection Time: 11/21/21  1:08 PM   Specimen: BLOOD LEFT FOREARM  Result Value Ref Range Status   Specimen Description BLOOD LEFT FOREARM  Final   Special Requests   Final    BOTTLES DRAWN AEROBIC AND ANAEROBIC Blood Culture adequate volume   Culture   Final    NO GROWTH 5 DAYS Performed at [facility], [place] [address], [place], [place] [address]    Report Status 11/26/2021 FINAL  Final  Resp Panel by RT-PCR (Flu A&B, Covid) Anterior Nasal Swab     Status: None   Collection Time: 11/21/21  2:10 PM   Specimen: Anterior Nasal Swab  Result Value Ref Range Status   SARS Coronavirus 2 by RT PCR NEGATIVE NEGATIVE Final    Comment: (NOTE) SARS-CoV-2 target nucleic acids are NOT DETECTED.  The SARS-CoV-2 RNA is generally detectable in upper respiratory specimens during the acute phase of infection. The lowest concentration of SARS-CoV-2 viral copies this assay can detect is 138 copies/mL. A negative result does not preclude SARS-Cov-2 infection and should not be used as  the sole basis for treatment or other patient management decisions. A negative result may occur with  improper specimen collection/handling, submission of specimen other than nasopharyngeal swab, presence of viral mutation(s) within the areas targeted by this assay, and inadequate number of viral copies(<138 copies/mL). A negative result must be combined with clinical observations, patient history, and epidemiological information. The expected result is Negative.  Fact Sheet for Patients:  EntrepreneurPulse.com.au  Fact Sheet for Healthcare Providers:  IncredibleEmployment.be  This test is no t yet approved or cleared by the [place] FDA and  has been authorized for detection and/or diagnosis of SARS-CoV-2 by FDA under an Emergency Use Authorization (EUA). This EUA will remain  in effect (meaning this test can be used) for the duration of the COVID-19 declaration under Section 564(b)(1) of the Act, 21 U.S.C.section 360bbb-3(b)(1), unless the authorization is terminated  or revoked sooner.       Influenza A by PCR NEGATIVE NEGATIVE Final   Influenza B by PCR NEGATIVE NEGATIVE Final    Comment: (NOTE) The Xpert Xpress SARS-CoV-2/FLU/RSV plus assay is intended as an aid in the diagnosis of influenza from Nasopharyngeal swab specimens and should not be used as a sole basis for treatment. Nasal washings and aspirates are unacceptable for Xpert Xpress SARS-CoV-2/FLU/RSV testing.  Fact Sheet for Patients: EntrepreneurPulse.com.au  Fact Sheet for Healthcare Providers: IncredibleEmployment.be  This test is not yet approved or cleared by the [place] FDA and has been authorized for detection and/or diagnosis of SARS-CoV-2 by FDA under an Emergency Use Authorization (EUA). This EUA will remain in effect (meaning this test can be used) for the duration of the COVID-19 declaration under Section 564(b)(1) of  the Act, 21 U.S.C. section 360bbb-3(b)(1), unless the authorization is terminated or revoked.  Performed at [facility], [place] [address] [place], [place] [address]   Urine Culture     Status: None   Collection Time: 11/21/21  3:05 PM   Specimen: Urine, Clean Catch  Result Value Ref Range Status   Specimen Description URINE, CLEAN CATCH  Final   Special Requests NONE  Final   Culture   Final    NO GROWTH Performed at [facility], [place] [address] [place], [place] [address]    Report Status 11/23/2021 FINAL  Final         Radiology Studies: CT Head Wo Contrast  Result Date: 11/26/2021 CLINICAL DATA:  Altered mental status and dizziness, initial encounter EXAM: CT HEAD WITHOUT CONTRAST TECHNIQUE: Contiguous axial images were obtained from the base of the skull through the vertex without intravenous contrast. RADIATION DOSE REDUCTION: This exam was performed according to the departmental dose-optimization program which includes automated exposure control, adjustment of the mA and/or kV according to patient size and/or use of iterative reconstruction technique. COMPARISON:  11/21/2021 FINDINGS: Brain: No evidence of acute infarction, hemorrhage, hydrocephalus, extra-axial collection or mass lesion/mass effect. Mild atrophic changes are noted. Vascular: Aneurysm clip is again noted in the anterior aspect of the right temporal lobe Skull: Postsurgical changes are noted similar to that seen on the prior exam. No acute calvarial abnormality is noted. Sinuses/Orbits: No acute finding. Other: None. IMPRESSION: Chronic changes without acute abnormality. Electronically Signed   By: Inez Catalina M.D.   On: 11/26/2021 23:58   DG Pelvis Portable  Result Date: 11/26/2021 CLINICAL DATA:  Fall. EXAM: PORTABLE PELVIS 1-2 VIEWS COMPARISON:  11/14/2021 FINDINGS: There is no evidence of pelvic fracture or diastasis. There is no dislocation at the hips. Fixation hardware is noted in the proximal  left femur without evidence of hardware loosening. No pelvic bone lesions are seen. IMPRESSION: No acute fracture or dislocation. Electronically Signed   By: Brett Fairy M.D.   On: 11/26/2021 22:15   DG Chest Portable 1 View  Result Date: 11/26/2021 CLINICAL DATA:  Dizziness, fell, hypotension EXAM: PORTABLE CHEST 1 VIEW COMPARISON:  11/21/2021 FINDINGS: Single frontal view of the chest demonstrates an unremarkable cardiac silhouette. No acute airspace disease. Trace left pleural effusion versus pleural thickening. No pneumothorax. No acute displaced fracture. IMPRESSION: 1. Trace left pleural effusion versus pleural thickening. 2. Otherwise unremarkable exam. Electronically Signed   By: Randa Ngo M.D.   On: 11/26/2021 20:44        Scheduled Meds:  aspirin  325 mg Oral Daily   atorvastatin  40 mg Oral Daily   cephALEXin  500 mg Oral Q8H   DULoxetine  60 mg Oral Daily   gabapentin  300 mg Oral Q24H   And   gabapentin  300 mg Oral Q1200   And   gabapentin  600 mg Oral QHS   insulin aspart  0-15 Units Subcutaneous TID WC   insulin aspart  0-5 Units Subcutaneous QHS   midodrine  5 mg Oral BID WC   pantoprazole  40 mg Oral Daily   propranolol  40 mg Oral TID   vitamin B-12  1,000 mcg Oral Daily   Continuous Infusions:     LOS: 1 day    Time spent: 35 minutes    Irine Seal, MD [facility]   To contact the attending provider between 7A-7P or the covering provider during after hours 7P-7A, please log into the web site [email or web address] and access using universal [place] password for that web site. If you do not have the password, please call the hospital operator.  11/28/2021, 3:17 PM

## 2021-11-28 NOTE — NC FL2 (Signed)
[place] LEVEL OF CARE SCREENING TOOL     IDENTIFICATION  Patient Name: Laura Gentry Birthdate: Jul 22, 1955 Sex: female Admission Date (Current Location): 11/26/2021  [facility] and [place] Number:  Herbalist and Address:  [facility] [place], [place] [address] [place], [place] [address]      Provider Number: [id number]  Attending Physician Name and Address:  Eugenie Filler, MD  Relative Name and Phone Number:       Current Level of Care: Hospital Recommended Level of Care: [place] Prior Approval Number:    Date Approved/Denied:   PASRR Number: pending  Discharge Plan: SNF    Current Diagnoses: Patient Active Problem List   Diagnosis Date Noted   Dizziness 16/60/6301   Acute metabolic encephalopathy 60/02/9322   Sepsis secondary to UTI ([place]) 11/21/2021   Acute urinary retention 11/04/2021   Hematuria 11/04/2021   Anemia, unspecified 11/03/2021   Hip fracture ([place]) 11/02/2021   Left elbow fracture 11/02/2021   S/p left hip fracture 11/02/2021   Essential hypertension 55/73/2202   Complicated UTI (urinary tract infection) 11/02/2021   Severe sepsis ([place]) 10/29/2021   DKA (diabetic ketoacidosis) ([place]) 10/29/2021   AKI (acute kidney injury) ([place]) 10/29/2021   Acquired hypothyroidism 10/29/2021   Ureteral obstruction, right 10/28/2021   Breast cancer screening by mammogram 01/31/2018   Need for immunization against influenza 01/31/2018   Elevated blood pressure reading 01/31/2018   Migraines 09/22/2016   Tremor of both hands 09/22/2016   History of colonic polyps 09/22/2016   HLD (hyperlipidemia) 03/07/2015   Difficulty sleeping 03/07/2015   Type 2 diabetes mellitus with peripheral neuropathy ([place]) 03/07/2015   Fatigue 03/07/2015   UTI (urinary tract infection) 03/07/2015   History of intracranial aneurysm 03/17/2013   History of depression 02/01/2013   Disorder of esophagus 08/19/2012   Calculus of  kidney 09/22/2011   H/O malignant neoplasm of skin 09/22/2011    Orientation RESPIRATION BLADDER Height & Weight     Self, Time, Situation, Place  Normal Continent Weight: 70.3 kg Height:  '5\' 7"'$  (170.2 cm)  BEHAVIORAL SYMPTOMS/MOOD NEUROLOGICAL BOWEL NUTRITION STATUS      Continent Diet (heart healthy/ carb modified with thin liquids)  AMBULATORY STATUS COMMUNICATION OF NEEDS Skin   Limited Assist Verbally Surgical wounds (healing left hip incision)                       Personal Care Assistance Level of Assistance  Bathing, Feeding, Dressing Bathing Assistance: Limited assistance Feeding assistance: Independent Dressing Assistance: Limited assistance     Functional Limitations Info  Sight, Hearing, Speech Sight Info: Adequate Hearing Info: Adequate Speech Info: Adequate    SPECIAL CARE FACTORS FREQUENCY  PT (By licensed PT), OT (By licensed OT)     PT Frequency: 5x/wk OT Frequency: 5x/wk            Contractures Contractures Info: Not present    Additional Factors Info  Code Status, Allergies, Psychotropic, Insulin Sliding Scale Code Status Info: Full Allergies Info: Amoxicillin   Bee Venom   Penicillins   Pneumococcal Vaccines   Sulfa Antibiotics   Bactrim (Sulfamethoxazole-trimethoprim) Psychotropic Info: Cymbalta Dr 60 mg daily/ Neurontin 300 mg BID and 600 mg at bedtime Insulin Sliding Scale Info: Novolog 0-15 units SQ three times a day and 0-5 units SQ at bedtime       Current Medications (11/28/2021):  This is the current hospital active medication list Current Facility-Administered  Medications  Medication Dose Route Frequency Provider Last Rate Last Admin   acetaminophen (TYLENOL) tablet 650 mg  650 mg Oral Q6H PRN Irene Pap N, DO       aspirin tablet 325 mg  325 mg Oral Daily Eugenie Filler, MD   325 mg at 11/28/21 0851   atorvastatin (LIPITOR) tablet 40 mg  40 mg Oral Daily Irene Pap N, DO   40 mg at 11/28/21 0851   cephALEXin (KEFLEX)  capsule 500 mg  500 mg Oral Q8H Eugenie Filler, MD   500 mg at 11/28/21 0518   DULoxetine (CYMBALTA) DR capsule 60 mg  60 mg Oral Daily Irene Pap N, DO   60 mg at 11/28/21 0851   gabapentin (NEURONTIN) capsule 300 mg  300 mg Oral Q24H Eugenie Filler, MD   300 mg at 11/28/21 7903   And   gabapentin (NEURONTIN) capsule 300 mg  300 mg Oral Q1200 Eugenie Filler, MD   300 mg at 11/27/21 1100   And   gabapentin (NEURONTIN) capsule 600 mg  600 mg Oral QHS Eugenie Filler, MD   600 mg at 11/27/21 2147   insulin aspart (novoLOG) injection 0-15 Units  0-15 Units Subcutaneous TID WC Irene Pap N, DO   3 Units at 11/28/21 8333   insulin aspart (novoLOG) injection 0-5 Units  0-5 Units Subcutaneous QHS Irene Pap N, DO   2 Units at 11/27/21 2148   midodrine (PROAMATINE) tablet 5 mg  5 mg Oral BID WC Eugenie Filler, MD       ondansetron [facility]) injection 4 mg  4 mg Intravenous Q6H PRN Irene Pap N, DO   4 mg at 11/27/21 0530   pantoprazole (PROTONIX) EC tablet 40 mg  40 mg Oral Daily Irene Pap N, DO   40 mg at 11/28/21 0851   polyethylene glycol (MIRALAX / GLYCOLAX) packet 17 g  17 g Oral Daily PRN Irene Pap N, DO       propranolol (INDERAL) tablet 40 mg  40 mg Oral TID Eugenie Filler, MD   40 mg at 11/28/21 0855   traZODone (DESYREL) tablet 50 mg  50 mg Oral QHS PRN Irene Pap N, DO   50 mg at 11/27/21 2148   vitamin B-12 (CYANOCOBALAMIN) tablet 1,000 mcg  1,000 mcg Oral Daily Irene Pap N, DO   1,000 mcg at 11/28/21 8329     Discharge Medications: Please see discharge summary for a list of discharge medications.  Relevant Imaging Results:  Relevant Lab Results:   Additional Information SS#: [id number]  Pollie Friar, RN

## 2021-11-28 NOTE — Plan of Care (Signed)

## 2021-11-29 DIAGNOSIS — D649 Anemia, unspecified: Secondary | ICD-10-CM | POA: Diagnosis not present

## 2021-11-29 DIAGNOSIS — D62 Acute posthemorrhagic anemia: Secondary | ICD-10-CM | POA: Diagnosis not present

## 2021-11-29 DIAGNOSIS — R42 Dizziness and giddiness: Secondary | ICD-10-CM | POA: Diagnosis not present

## 2021-11-29 DIAGNOSIS — E119 Type 2 diabetes mellitus without complications: Secondary | ICD-10-CM | POA: Diagnosis not present

## 2021-11-29 LAB — GLUCOSE, CAPILLARY
Glucose-Capillary: 210 mg/dL — ABNORMAL HIGH (ref 70–99)
Glucose-Capillary: 121 mg/dL — ABNORMAL HIGH (ref 70–99)
Glucose-Capillary: 300 mg/dL — ABNORMAL HIGH (ref 70–99)

## 2021-11-29 LAB — CBC
HCT: 33.5 % — ABNORMAL LOW (ref 36.0–46.0)
Hemoglobin: 10.5 g/dL — ABNORMAL LOW (ref 12.0–15.0)
MCH: 24.5 pg — ABNORMAL LOW (ref 26.0–34.0)
MCHC: 31.3 g/dL (ref 30.0–36.0)
MCV: 78.1 fL — ABNORMAL LOW (ref 80.0–100.0)
Platelets: 445 10*3/uL — ABNORMAL HIGH (ref 150–400)
RBC: 4.29 MIL/uL (ref 3.87–5.11)
RDW: 14.7 % (ref 11.5–15.5)
WBC: 10.1 10*3/uL (ref 4.0–10.5)
nRBC: 0 % (ref 0.0–0.2)

## 2021-11-29 LAB — BASIC METABOLIC PANEL
Anion gap: 10 (ref 5–15)
BUN: 5 mg/dL — ABNORMAL LOW (ref 8–23)
CO2: 27 mmol/L (ref 22–32)
Calcium: 8.3 mg/dL — ABNORMAL LOW (ref 8.9–10.3)
Chloride: 103 mmol/L (ref 98–111)
Creatinine, Ser: 0.96 mg/dL (ref 0.44–1.00)
GFR, Estimated: >60 mL/min (ref >60)
Glucose, Bld: 221 mg/dL — ABNORMAL HIGH (ref 70–99)
Potassium: 3.3 mmol/L — ABNORMAL LOW (ref 3.5–5.1)
Sodium: 140 mmol/L (ref 135–145)

## 2021-11-29 MED ORDER — INSULIN GLARGINE-YFGN 100 UNIT/ML ~~LOC~~ SOLN
15.0000 [IU] | Freq: Every day | SUBCUTANEOUS | Status: DC
  Administered 2021-11-29: 15 [IU] via SUBCUTANEOUS
  Filled 2021-11-29 (×2): qty 0.15

## 2021-11-29 MED ORDER — POTASSIUM CHLORIDE CRYS ER 20 MEQ PO TBCR
40.0000 meq | EXTENDED_RELEASE_TABLET | Freq: Once | ORAL | Status: AC
  Administered 2021-11-29: 40 meq via ORAL
  Filled 2021-11-29: qty 2

## 2021-11-29 NOTE — Plan of Care (Signed)
  Problem: Education: Goal: Ability to describe self-care measures that may prevent or decrease complications (Diabetes Survival Skills Education) will improve Outcome: Progressing   Problem: Skin Integrity: Goal: Risk for impaired skin integrity will decrease Outcome: Progressing   Problem: Tissue Perfusion: Goal: Adequacy of tissue perfusion will improve Outcome: Progressing   Problem: Health Behavior/Discharge Planning: Goal: Ability to manage health-related needs will improve Outcome: Progressing   Problem: Clinical Measurements: Goal: Ability to maintain clinical measurements within normal limits will improve Outcome: Progressing Goal: Will remain free from infection Outcome: Progressing Goal: Diagnostic test results will improve Outcome: Progressing Goal: Respiratory complications will improve Outcome: Progressing Goal: Cardiovascular complication will be avoided Outcome: Progressing

## 2021-11-29 NOTE — Progress Notes (Signed)
PROGRESS NOTE    Anylah Scheib  WSF:[id number] DOB: 1955-06-04 DOA: 11/26/2021 PCP: Seward Carol, MD    Chief Complaint  Patient presents with   Dizziness   Fall    Brief Narrative: HPI per Dr. Mosie Epstein Cressida Milford is a 66 y.o. female with medical history significant for type 2 diabetes, hyperlipidemia, essential hypertension, recently diagnosed and admitted for sepsis secondary to obstructing renal stone, left hip fracture post mechanical fall requiring surgical intervention who presents to [facility] ED with complaints of dizziness and a reported fall.  Denies hitting her head.  No reported fevers.  Work-up in the ED reveals drop in hemoglobin to 7.9 from baseline of 10.  1 U PRBCs ordered by EDP to be transfused.  EDP requested admission due to concern for symptomatic anemia.  The patient was admitted by the hospitalist service, TRH.   ED Course: Tmax 98.1.  BP 157/76.  Pulse 88.  Respiratory 20, O2 saturation 96% on room air.  Lab studies remarkable for WBC 11.5.  Hemoglobin 7.9.  MCV 78.  UA positive for pyuria.  Assessment & Plan:   Principal Problem:   Dizziness Active Problems:   Symptomatic anemia   Acute blood loss anemia   Controlled type 2 diabetes mellitus without complication, without long-term current use of insulin (HCC)   Hypomagnesemia   Orthostatic hypotension  1 dizziness, unclear etiology concern for symptomatic anemia versus hypovolemia, intravascular depletion versus orthostasis -Patient noted to have presented with dizziness, and a fall. -Concern for symptomatic anemia as hemoglobin noted at 7.9 on admission from 8.7 (11/25/2021) -Patient denies any overt bleeding. -Status posttransfusion 1 unit packed red blood cells 11/27/2021 with hemoglobin currently at 10.1 -Repeat orthostatics done on 11/27/2021, per PT with some drop in blood pressure however patient remained asymptomatic. -Patient noted with chronic hypotension noted on midodrine. -Saline lock IV  fluids.   -Continue home regimen midodrine.   2.  Acute blood loss anemia -Baseline hemoglobin approximately 10. -Hemoglobin noted at 7.9 on presentation however was 8.7 during recent discharge (11/25/2021). -Status post transfusion 1 unit packed red blood cells hemoglobin currently at 10.5. -Anemia panel ordered with iron level of 24, ferritin of 91, TIBC of 211. -PPI. -Follow H&H.  3.  Type 2 diabetes mellitus with hyperglycemia -Hemoglobin A1c 8.8 (10/29/2021). -CBG of 210 this morning -Semglee 15 units daily -SSI.  4.  Hypomagnesemia/hypokalemia -Magnesium at 2.3.  Potassium at 3.3. -K. Dur 40 mEq p.o. x1.    5.  History of orthostatic hypotension -Continue TED hose. -Continue midodrine.  6.  History of recent sepsis due to complicated UTI/pyelonephritis, POA -From recent discharge summary patient noted to have been discharged on Keflex 500 3 times daily for 7 more days on discharge however patient stated pharmacy was not open on July 4 and has not had any antibiotics since discharge. -Continue Keflex 500 mg 3 times daily x 5 days as noted on recent discharge summary.  7.  Status post recent left hip fracture with repair -PT/OT. -Pain management. -Will need SNF placement. -TOC consulted.   DVT prophylaxis: Aspirin Code Status: Full Family Communication: Updated patient.  No family at bedside. Disposition: SNF  Status is: Inpatient The patient will require care spanning > 2 midnights and should be moved to inpatient because: Unsafe disposition   Consultants:  None  Procedures:  CT head without contrast 11/26/2021 Plan.  The pelvis 11/26/2021 Chest x-ray 11/26/2021   Antimicrobials:  Keflex 11/27/2021>>>>> 12/04/2021   Subjective: Laying in bed.  Denies any lightheadedness or dizziness.  No chest pain.  No shortness of breath.  Was able to visit her mother in the hospital yesterday per patient.   Objective: Vitals:   11/28/21 2332 11/29/21 0601 11/29/21 0737  11/29/21 1107  BP: (!) 160/72 (!) 145/68 (!) 152/82 (!) 167/82  Pulse: 78 65 69 67  Resp: '15 16 16 18  '$ Temp: 98.8 F (37.1 C) 98.1 F (36.7 C) 98.7 F (37.1 C) 98.4 F (36.9 C)  TempSrc: Oral Oral Oral Oral  SpO2: 93% 96% 92% 97%  Weight:      Height:        Intake/Output Summary (Last 24 hours) at 11/29/2021 1155 Last data filed at 11/29/2021 0600 Gross per 24 hour  Intake 480 ml  Output --  Net 480 ml    Filed Weights   11/26/21 2233  Weight: 70.3 kg    Examination:  General exam: NAD Respiratory system: Lungs clear to auscultation bilaterally.  No wheezes, no crackles, no rhonchi.  Cardiovascular system: Regular rate and rhythm no murmurs rubs or gallops.  No JVD.  No lower extremity edema.  Gastrointestinal system: Abdomen is nondistended, soft and nontender. No organomegaly or masses felt. Normal bowel sounds heard. Central nervous system: Alert and oriented. No focal neurological deficits. Extremities: Symmetric 5 x 5 power. Skin: No rashes, lesions or ulcers Psychiatry: Judgement and insight appear normal. Mood & affect appropriate.     Data Reviewed: I have personally reviewed following labs and imaging studies  CBC: Recent Labs  Lab 11/24/21 0048 11/25/21 0114 11/26/21 2042 11/27/21 0412 11/28/21 0143 11/29/21 0203  WBC 10.0 11.2* 11.5* 10.4 10.4 10.1  NEUTROABS 7.0 7.9* 8.5* 6.4  --   --   HGB 9.2* 8.7* 7.9* 9.4* 10.1* 10.5*  HCT 29.0* 28.3* 26.2* 29.2* 31.4* 33.5*  MCV 75.3* 76.5* 78.4* 77.5* 76.4* 78.1*  PLT 292 317 370 385 441* 445*     Basic Metabolic Panel: Recent Labs  Lab 11/24/21 0048 11/25/21 0114 11/26/21 2042 11/26/21 2235 11/27/21 0412 11/28/21 0143 11/29/21 0203  NA 135 137 138  --  139 139 140  K 3.5 3.5 3.8  --  3.3* 3.8 3.3*  CL 100 99 101  --  105 104 103  CO2 '25 26 23  '$ --  '22 26 27  '$ GLUCOSE 185* 149* 220*  --  153* 182* 221*  BUN 6* 6* 8  --  5* <5* 5*  CREATININE 0.79 0.92 0.75  --  0.72 0.71 0.96  CALCIUM 8.1*  8.2* 7.9*  --  7.9* 8.1* 8.3*  MG 1.5* 1.9  --  1.6* 1.5* 2.3  --   PHOS  --   --   --   --  3.6  --   --      GFR: Estimated Creatinine Clearance: 56.1 mL/min (by C-G formula based on SCr of 0.96 mg/dL).  Liver Function Tests: Recent Labs  Lab 11/26/21 2042 11/27/21 0412  AST 17 16  ALT 26 26  ALKPHOS 106 101  BILITOT 0.8 0.6  PROT 5.6* 5.7*  ALBUMIN 1.9* 2.1*     CBG: Recent Labs  Lab 11/28/21 0902 11/28/21 1227 11/28/21 1633 11/28/21 2132 11/29/21 0627  GLUCAP 165* 151* 143* 195* 210*      Recent Results (from the past 240 hour(s))  Blood culture (routine x 2)     Status: None   Collection Time: 11/21/21  1:03 PM   Specimen: BLOOD RIGHT HAND  Result Value Ref Range  Status   Specimen Description BLOOD RIGHT HAND  Final   Special Requests   Final    BOTTLES DRAWN AEROBIC AND ANAEROBIC Blood Culture adequate volume   Culture   Final    NO GROWTH 5 DAYS Performed at [facility], [address] [place], [place] [address]    Report Status 11/26/2021 FINAL  Final  Blood culture (routine x 2)     Status: None   Collection Time: 11/21/21  1:08 PM   Specimen: BLOOD LEFT FOREARM  Result Value Ref Range Status   Specimen Description BLOOD LEFT FOREARM  Final   Special Requests   Final    BOTTLES DRAWN AEROBIC AND ANAEROBIC Blood Culture adequate volume   Culture   Final    NO GROWTH 5 DAYS Performed at [facility], [place] [address] [place], [place] [address]    Report Status 11/26/2021 FINAL  Final  Resp Panel by RT-PCR (Flu A&B, Covid) Anterior Nasal Swab     Status: None   Collection Time: 11/21/21  2:10 PM   Specimen: Anterior Nasal Swab  Result Value Ref Range Status   SARS Coronavirus 2 by RT PCR NEGATIVE NEGATIVE Final    Comment: (NOTE) SARS-CoV-2 target nucleic acids are NOT DETECTED.  The SARS-CoV-2 RNA is generally detectable in upper respiratory specimens during the acute phase of infection. The lowest concentration of SARS-CoV-2  viral copies this assay can detect is 138 copies/mL. A negative result does not preclude SARS-Cov-2 infection and should not be used as the sole basis for treatment or other patient management decisions. A negative result may occur with  improper specimen collection/handling, submission of specimen other than nasopharyngeal swab, presence of viral mutation(s) within the areas targeted by this assay, and inadequate number of viral copies(<138 copies/mL). A negative result must be combined with clinical observations, patient history, and epidemiological information. The expected result is Negative.  Fact Sheet for Patients:  EntrepreneurPulse.com.au  Fact Sheet for Healthcare Providers:  IncredibleEmployment.be  This test is no t yet approved or cleared by the [place] FDA and  has been authorized for detection and/or diagnosis of SARS-CoV-2 by FDA under an Emergency Use Authorization (EUA). This EUA will remain  in effect (meaning this test can be used) for the duration of the COVID-19 declaration under Section 564(b)(1) of the Act, 21 U.S.C.section 360bbb-3(b)(1), unless the authorization is terminated  or revoked sooner.       Influenza A by PCR NEGATIVE NEGATIVE Final   Influenza B by PCR NEGATIVE NEGATIVE Final    Comment: (NOTE) The Xpert Xpress SARS-CoV-2/FLU/RSV plus assay is intended as an aid in the diagnosis of influenza from Nasopharyngeal swab specimens and should not be used as a sole basis for treatment. Nasal washings and aspirates are unacceptable for Xpert Xpress SARS-CoV-2/FLU/RSV testing.  Fact Sheet for Patients: EntrepreneurPulse.com.au  Fact Sheet for Healthcare Providers: IncredibleEmployment.be  This test is not yet approved or cleared by the [place] FDA and has been authorized for detection and/or diagnosis of SARS-CoV-2 by FDA under an Emergency Use Authorization  (EUA). This EUA will remain in effect (meaning this test can be used) for the duration of the COVID-19 declaration under Section 564(b)(1) of the Act, 21 U.S.C. section 360bbb-3(b)(1), unless the authorization is terminated or revoked.  Performed at [facility], [place] [address], [place], [place] [address]   Urine Culture     Status: None   Collection Time: 11/21/21  3:05 PM  Specimen: Urine, Clean Catch  Result Value Ref Range Status   Specimen Description URINE, CLEAN CATCH  Final   Special Requests NONE  Final   Culture   Final    NO GROWTH Performed at [facility], [address], [place], [place] [address]    Report Status 11/23/2021 FINAL  Final         Radiology Studies: No results found.      Scheduled Meds:  aspirin  325 mg Oral Daily   atorvastatin  40 mg Oral Daily   cephALEXin  500 mg Oral Q8H   DULoxetine  60 mg Oral Daily   gabapentin  300 mg Oral Q24H   And   gabapentin  300 mg Oral Q1200   And   gabapentin  600 mg Oral QHS   insulin aspart  0-15 Units Subcutaneous TID WC   insulin aspart  0-5 Units Subcutaneous QHS   insulin glargine-yfgn  15 Units Subcutaneous QHS   midodrine  5 mg Oral BID WC   pantoprazole  40 mg Oral Daily   propranolol  40 mg Oral TID   vitamin B-12  1,000 mcg Oral Daily   Continuous Infusions:     LOS: 2 days    Time spent: 35 minutes    Irine Seal, MD [facility]   To contact the attending provider between 7A-7P or the covering provider during after hours 7P-7A, please log into the web site [email or web address] and access using universal [place] password for that web site. If you do not have the password, please call the hospital operator.  11/29/2021, 11:55 AM

## 2021-11-29 NOTE — Plan of Care (Signed)

## 2021-11-30 DIAGNOSIS — D649 Anemia, unspecified: Secondary | ICD-10-CM | POA: Diagnosis not present

## 2021-11-30 DIAGNOSIS — D62 Acute posthemorrhagic anemia: Secondary | ICD-10-CM | POA: Diagnosis not present

## 2021-11-30 DIAGNOSIS — R42 Dizziness and giddiness: Secondary | ICD-10-CM | POA: Diagnosis not present

## 2021-11-30 DIAGNOSIS — E119 Type 2 diabetes mellitus without complications: Secondary | ICD-10-CM | POA: Diagnosis not present

## 2021-11-30 LAB — MAGNESIUM: Magnesium: 1.6 mg/dL — ABNORMAL LOW (ref 1.7–2.4)

## 2021-11-30 LAB — BASIC METABOLIC PANEL
Anion gap: 9 (ref 5–15)
BUN: 9 mg/dL (ref 8–23)
CO2: 27 mmol/L (ref 22–32)
Calcium: 8.3 mg/dL — ABNORMAL LOW (ref 8.9–10.3)
Chloride: 98 mmol/L (ref 98–111)
Creatinine, Ser: 0.89 mg/dL (ref 0.44–1.00)
GFR, Estimated: >60 mL/min (ref >60)
Glucose, Bld: 228 mg/dL — ABNORMAL HIGH (ref 70–99)
Potassium: 3.9 mmol/L (ref 3.5–5.1)
Sodium: 134 mmol/L — ABNORMAL LOW (ref 135–145)

## 2021-11-30 LAB — GLUCOSE, CAPILLARY
Glucose-Capillary: 204 mg/dL — ABNORMAL HIGH (ref 70–99)
Glucose-Capillary: 225 mg/dL — ABNORMAL HIGH (ref 70–99)
Glucose-Capillary: 257 mg/dL — ABNORMAL HIGH (ref 70–99)
Glucose-Capillary: 112 mg/dL — ABNORMAL HIGH (ref 70–99)
Glucose-Capillary: 207 mg/dL — ABNORMAL HIGH (ref 70–99)

## 2021-11-30 MED ORDER — PROPRANOLOL HCL 20 MG PO TABS
20.0000 mg | ORAL_TABLET | Freq: Three times a day (TID) | ORAL | Status: DC
  Administered 2021-11-30 – 2021-12-01 (×5): 20 mg via ORAL
  Filled 2021-11-30 (×8): qty 1

## 2021-11-30 MED ORDER — INSULIN GLARGINE-YFGN 100 UNIT/ML ~~LOC~~ SOLN
25.0000 [IU] | Freq: Every day | SUBCUTANEOUS | Status: DC
  Administered 2021-11-30: 25 [IU] via SUBCUTANEOUS
  Filled 2021-11-30 (×3): qty 0.25

## 2021-11-30 MED ORDER — MAGNESIUM SULFATE 4 GM/100ML IV SOLN
4.0000 g | Freq: Once | INTRAVENOUS | Status: AC
  Administered 2021-11-30: 4 g via INTRAVENOUS
  Filled 2021-11-30 (×2): qty 100

## 2021-11-30 MED ORDER — INSULIN GLARGINE-YFGN 100 UNIT/ML ~~LOC~~ SOLN: 20.0000 [IU] | Freq: Every day | SUBCUTANEOUS | Status: DC

## 2021-11-30 MED ORDER — MAGNESIUM OXIDE -MG SUPPLEMENT 400 (240 MG) MG PO TABS
400.0000 mg | ORAL_TABLET | Freq: Every day | ORAL | Status: DC
  Administered 2021-12-01: 400 mg via ORAL
  Filled 2021-11-30: qty 1

## 2021-11-30 MED ORDER — INSULIN GLARGINE-YFGN 100 UNIT/ML ~~LOC~~ SOLN
5.0000 [IU] | Freq: Once | SUBCUTANEOUS | Status: AC
  Administered 2021-11-30: 5 [IU] via SUBCUTANEOUS
  Filled 2021-11-30: qty 0.05

## 2021-11-30 NOTE — Progress Notes (Signed)
PROGRESS NOTE    Laura Gentry  MGQ:[id number] DOB: Feb 20, 1956 DOA: 11/26/2021 PCP: Seward Carol, MD    Chief Complaint  Patient presents with   Dizziness   Fall    Brief Narrative: HPI per Dr. Mosie Epstein Laura Gentry is a 66 y.o. female with medical history significant for type 2 diabetes, hyperlipidemia, essential hypertension, recently diagnosed and admitted for sepsis secondary to obstructing renal stone, left hip fracture post mechanical fall requiring surgical intervention who presents to [facility] ED with complaints of dizziness and a reported fall.  Denies hitting her head.  No reported fevers.  Work-up in the ED reveals drop in hemoglobin to 7.9 from baseline of 10.  1 U PRBCs ordered by EDP to be transfused.  EDP requested admission due to concern for symptomatic anemia.  The patient was admitted by the hospitalist service, TRH.   ED Course: Tmax 98.1.  BP 157/76.  Pulse 88.  Respiratory 20, O2 saturation 96% on room air.  Lab studies remarkable for WBC 11.5.  Hemoglobin 7.9.  MCV 78.  UA positive for pyuria.  Assessment & Plan:   Principal Problem:   Dizziness Active Problems:   Symptomatic anemia   Acute blood loss anemia   Controlled type 2 diabetes mellitus without complication, without long-term current use of insulin (HCC)   Hypomagnesemia   Orthostatic hypotension  1 dizziness, unclear etiology concern for symptomatic anemia versus hypovolemia, intravascular depletion versus orthostasis -Patient noted to have presented with dizziness, and a fall. -Concern for symptomatic anemia as hemoglobin noted at 7.9 on admission from 8.7 (11/25/2021) -Patient denies any overt bleeding. -Status posttransfusion 1 unit packed red blood cells 11/27/2021 with hemoglobin currently at 10.5. -Repeat orthostatics done on 11/27/2021, per PT with some drop in blood pressure however patient remained asymptomatic. -Patient noted with chronic hypotension noted on midodrine. -Decrease propranolol  to 20 mg 3 times daily. -Saline lock IV fluids.   -Continue home regimen midodrine.   2.  Acute blood loss anemia -Baseline hemoglobin approximately 10. -Hemoglobin noted at 7.9 on presentation however was 8.7 during recent discharge (11/25/2021). -Status post transfusion 1 unit packed red blood cells hemoglobin currently at 10.5. -Anemia panel ordered with iron level of 24, ferritin of 91, TIBC of 211. -PPI. -Follow H&H.  3.  Type 2 diabetes mellitus with hyperglycemia -Hemoglobin A1c 8.8 (10/29/2021). -CBG of 225 this morning -Increase Semglee to 25 units daily.  -SSI.  4.  Hypomagnesemia/hypokalemia -Magnesium at 1.6.  Potassium at 3.9.   -Magnesium sulfate 4 g IV x1.   -Place on oral magnesium supplementation starting 12/01/2021.   -Repeat labs in the AM.    5.  History of orthostatic hypotension -TED hose.   -Continue midodrine.   -Decrease propranolol dose to 20 mg 3 times daily.   -Outpatient follow-up.    6.  History of recent sepsis due to complicated UTI/pyelonephritis, POA -From recent discharge summary patient noted to have been discharged on Keflex 500 3 times daily for 7 more days on discharge however patient stated pharmacy was not open on July 4 and has not had any antibiotics since discharge. -Continue Keflex 500 mg 3 times daily x 4 days as noted on recent discharge summary to complete a 7-day course.  7.  Status post recent left hip fracture with repair -PT/OT. -Pain management. -Will need SNF placement. -TOC consulted.  8.  Essential tremors -Decrease dose of propranolol to 20 mg 3 times daily, due to orthostatic hypotension.   DVT prophylaxis:  Aspirin Code Status: Full Family Communication: Updated patient.  No family at bedside. Disposition: SNF  Status is: Inpatient The patient will require care spanning > 2 midnights and should be moved to inpatient because: Unsafe disposition   Consultants:  None  Procedures:  CT head without contrast  11/26/2021 Plain films of the pelvis 11/26/2021 Chest x-ray 11/26/2021   Antimicrobials:  Keflex 11/27/2021>>>>> 12/04/2021   Subjective: Sleeping but arousable.  No chest pain.  No shortness of breath.  No dizziness or lightheadedness.  Asking whether she can take a shower.  Patient states had a headache overnight which has since resolved.  Objective: Vitals:   11/29/21 2143 11/30/21 0016 11/30/21 0432 11/30/21 0756  BP: (!) 161/67 (!) 144/82 129/71 (!) 129/59  Pulse: 75 77 69 76  Resp: '16 15 15 18  '$ Temp: 99 F (37.2 C) 99.6 F (37.6 C) 98.5 F (36.9 C) 99.3 F (37.4 C)  TempSrc: Oral Oral Oral Oral  SpO2: 94% 92% 96% 99%  Weight:      Height:        Intake/Output Summary (Last 24 hours) at 11/30/2021 1004 Last data filed at 11/30/2021 0525 Gross per 24 hour  Intake 180 ml  Output --  Net 180 ml    Filed Weights   11/26/21 2233  Weight: 70.3 kg    Examination:  General exam: Alert.  Comfortable.  NAD. Respiratory system: CTA B.  No wheezes, no crackles, no rhonchi.  Fair air movement.  Speaking in full sentences.  Cardiovascular system: RRR no murmurs rubs or gallops.  No JVD.  No lower extremity edema.  Gastrointestinal system: Abdomen is soft, nontender, nondistended, positive bowel sounds.  No rebound.  No guarding.  Central nervous system: Alert and oriented. No focal neurological deficits. Extremities: Symmetric 5 x 5 power. Skin: No rashes, lesions or ulcers Psychiatry: Judgement and insight appear normal. Mood & affect appropriate.     Data Reviewed: I have personally reviewed following labs and imaging studies  CBC: Recent Labs  Lab 11/24/21 0048 11/25/21 0114 11/26/21 2042 11/27/21 0412 11/28/21 0143 11/29/21 0203  WBC 10.0 11.2* 11.5* 10.4 10.4 10.1  NEUTROABS 7.0 7.9* 8.5* 6.4  --   --   HGB 9.2* 8.7* 7.9* 9.4* 10.1* 10.5*  HCT 29.0* 28.3* 26.2* 29.2* 31.4* 33.5*  MCV 75.3* 76.5* 78.4* 77.5* 76.4* 78.1*  PLT 292 317 370 385 441* 445*     Basic  Metabolic Panel: Recent Labs  Lab 11/25/21 0114 11/26/21 2042 11/26/21 2235 11/27/21 0412 11/28/21 0143 11/29/21 0203 11/30/21 0120  NA 137 138  --  139 139 140 134*  K 3.5 3.8  --  3.3* 3.8 3.3* 3.9  CL 99 101  --  105 104 103 98  CO2 26 23  --  '22 26 27 27  '$ GLUCOSE 149* 220*  --  153* 182* 221* 228*  BUN 6* 8  --  5* <5* 5* 9  CREATININE 0.92 0.75  --  0.72 0.71 0.96 0.89  CALCIUM 8.2* 7.9*  --  7.9* 8.1* 8.3* 8.3*  MG 1.9  --  1.6* 1.5* 2.3  --  1.6*  PHOS  --   --   --  3.6  --   --   --      GFR: Estimated Creatinine Clearance: 60.5 mL/min (by C-G formula based on SCr of 0.89 mg/dL).  Liver Function Tests: Recent Labs  Lab 11/26/21 2042 11/27/21 0412  AST 17 16  ALT 26 26  ALKPHOS  106 101  BILITOT 0.8 0.6  PROT 5.6* 5.7*  ALBUMIN 1.9* 2.1*     CBG: Recent Labs  Lab 11/28/21 2132 11/29/21 0627 11/29/21 1617 11/29/21 2142 11/30/21 0638  GLUCAP 195* 210* 121* 300* 225*      Recent Results (from the past 240 hour(s))  Blood culture (routine x 2)     Status: None   Collection Time: 11/21/21  1:03 PM   Specimen: BLOOD RIGHT HAND  Result Value Ref Range Status   Specimen Description BLOOD RIGHT HAND  Final   Special Requests   Final    BOTTLES DRAWN AEROBIC AND ANAEROBIC Blood Culture adequate volume   Culture   Final    NO GROWTH 5 DAYS Performed at [facility], [place] [address], [place], [place] [address]    Report Status 11/26/2021 FINAL  Final  Blood culture (routine x 2)     Status: None   Collection Time: 11/21/21  1:08 PM   Specimen: BLOOD LEFT FOREARM  Result Value Ref Range Status   Specimen Description BLOOD LEFT FOREARM  Final   Special Requests   Final    BOTTLES DRAWN AEROBIC AND ANAEROBIC Blood Culture adequate volume   Culture   Final    NO GROWTH 5 DAYS Performed at [facility], [place] [address] [place], [place] [address]    Report Status 11/26/2021 FINAL  Final  Resp Panel by RT-PCR (Flu A&B, Covid) Anterior Nasal  Swab     Status: None   Collection Time: 11/21/21  2:10 PM   Specimen: Anterior Nasal Swab  Result Value Ref Range Status   SARS Coronavirus 2 by RT PCR NEGATIVE NEGATIVE Final    Comment: (NOTE) SARS-CoV-2 target nucleic acids are NOT DETECTED.  The SARS-CoV-2 RNA is generally detectable in upper respiratory specimens during the acute phase of infection. The lowest concentration of SARS-CoV-2 viral copies this assay can detect is 138 copies/mL. A negative result does not preclude SARS-Cov-2 infection and should not be used as the sole basis for treatment or other patient management decisions. A negative result may occur with  improper specimen collection/handling, submission of specimen other than nasopharyngeal swab, presence of viral mutation(s) within the areas targeted by this assay, and inadequate number of viral copies(<138 copies/mL). A negative result must be combined with clinical observations, patient history, and epidemiological information. The expected result is Negative.  Fact Sheet for Patients:  EntrepreneurPulse.com.au  Fact Sheet for Healthcare Providers:  IncredibleEmployment.be  This test is no t yet approved or cleared by the [place] FDA and  has been authorized for detection and/or diagnosis of SARS-CoV-2 by FDA under an Emergency Use Authorization (EUA). This EUA will remain  in effect (meaning this test can be used) for the duration of the COVID-19 declaration under Section 564(b)(1) of the Act, 21 U.S.C.section 360bbb-3(b)(1), unless the authorization is terminated  or revoked sooner.       Influenza A by PCR NEGATIVE NEGATIVE Final   Influenza B by PCR NEGATIVE NEGATIVE Final    Comment: (NOTE) The Xpert Xpress SARS-CoV-2/FLU/RSV plus assay is intended as an aid in the diagnosis of influenza from Nasopharyngeal swab specimens and should not be used as a sole basis for treatment. Nasal washings and aspirates  are unacceptable for Xpert Xpress SARS-CoV-2/FLU/RSV testing.  Fact Sheet for Patients: EntrepreneurPulse.com.au  Fact Sheet for Healthcare Providers: IncredibleEmployment.be  This test is not yet approved or cleared by the [place] and has been authorized  for detection and/or diagnosis of SARS-CoV-2 by FDA under an Emergency Use Authorization (EUA). This EUA will remain in effect (meaning this test can be used) for the duration of the COVID-19 declaration under Section 564(b)(1) of the Act, 21 U.S.C. section 360bbb-3(b)(1), unless the authorization is terminated or revoked.  Performed at [facility], [place] [address], [place], [place] [address]   Urine Culture     Status: None   Collection Time: 11/21/21  3:05 PM   Specimen: Urine, Clean Catch  Result Value Ref Range Status   Specimen Description URINE, CLEAN CATCH  Final   Special Requests NONE  Final   Culture   Final    NO GROWTH Performed at [facility], [place] [address], [place],  [address]    Report Status 11/23/2021 FINAL  Final         Radiology Studies: No results found.      Scheduled Meds:  aspirin  325 mg Oral Daily   atorvastatin  40 mg Oral Daily   cephALEXin  500 mg Oral Q8H   DULoxetine  60 mg Oral Daily   gabapentin  300 mg Oral Q24H   And   gabapentin  300 mg Oral Q1200   And   gabapentin  600 mg Oral QHS   insulin aspart  0-15 Units Subcutaneous TID WC   insulin aspart  0-5 Units Subcutaneous QHS   insulin glargine-yfgn  25 Units Subcutaneous QHS   insulin glargine-yfgn  5 Units Subcutaneous Once   [START ON 12/01/2021] magnesium oxide  400 mg Oral Daily   midodrine  5 mg Oral BID WC   pantoprazole  40 mg Oral Daily   propranolol  20 mg Oral TID   vitamin B-12  1,000 mcg Oral Daily   Continuous Infusions:  magnesium sulfate bolus IVPB        LOS: 3 days    Time spent: 35 minutes    Irine Seal, MD [facility]   To contact the attending provider between 7A-7P or the covering provider during after hours 7P-7A, please log into the web site [email or web address] and access using universal [place] password for that web site. If you do not have the password, please call the hospital operator.  11/30/2021, 10:04 AM

## 2021-12-01 DIAGNOSIS — R42 Dizziness and giddiness: Secondary | ICD-10-CM | POA: Diagnosis not present

## 2021-12-01 DIAGNOSIS — M6281 Muscle weakness (generalized): Secondary | ICD-10-CM | POA: Diagnosis not present

## 2021-12-01 DIAGNOSIS — Z7401 Bed confinement status: Secondary | ICD-10-CM | POA: Diagnosis not present

## 2021-12-01 DIAGNOSIS — W19XXXA Unspecified fall, initial encounter: Secondary | ICD-10-CM | POA: Diagnosis not present

## 2021-12-01 DIAGNOSIS — D62 Acute posthemorrhagic anemia: Secondary | ICD-10-CM | POA: Diagnosis not present

## 2021-12-01 DIAGNOSIS — D649 Anemia, unspecified: Secondary | ICD-10-CM | POA: Diagnosis not present

## 2021-12-01 DIAGNOSIS — I69828 Other speech and language deficits following other cerebrovascular disease: Secondary | ICD-10-CM | POA: Diagnosis not present

## 2021-12-01 DIAGNOSIS — E1165 Type 2 diabetes mellitus with hyperglycemia: Secondary | ICD-10-CM | POA: Diagnosis not present

## 2021-12-01 DIAGNOSIS — N390 Urinary tract infection, site not specified: Secondary | ICD-10-CM | POA: Diagnosis not present

## 2021-12-01 DIAGNOSIS — R2689 Other abnormalities of gait and mobility: Secondary | ICD-10-CM | POA: Diagnosis not present

## 2021-12-01 DIAGNOSIS — B9689 Other specified bacterial agents as the cause of diseases classified elsewhere: Secondary | ICD-10-CM | POA: Diagnosis not present

## 2021-12-01 DIAGNOSIS — R41841 Cognitive communication deficit: Secondary | ICD-10-CM | POA: Diagnosis not present

## 2021-12-01 DIAGNOSIS — E119 Type 2 diabetes mellitus without complications: Secondary | ICD-10-CM | POA: Diagnosis not present

## 2021-12-01 DIAGNOSIS — I951 Orthostatic hypotension: Secondary | ICD-10-CM | POA: Diagnosis not present

## 2021-12-01 DIAGNOSIS — G250 Essential tremor: Secondary | ICD-10-CM | POA: Diagnosis not present

## 2021-12-01 DIAGNOSIS — R1312 Dysphagia, oropharyngeal phase: Secondary | ICD-10-CM | POA: Diagnosis not present

## 2021-12-01 LAB — BASIC METABOLIC PANEL
Anion gap: 9 (ref 5–15)
BUN: 9 mg/dL (ref 8–23)
CO2: 27 mmol/L (ref 22–32)
Calcium: 8.6 mg/dL — ABNORMAL LOW (ref 8.9–10.3)
Chloride: 101 mmol/L (ref 98–111)
Creatinine, Ser: 0.98 mg/dL (ref 0.44–1.00)
GFR, Estimated: >60 mL/min (ref >60)
Glucose, Bld: 194 mg/dL — ABNORMAL HIGH (ref 70–99)
Potassium: 3.7 mmol/L (ref 3.5–5.1)
Sodium: 137 mmol/L (ref 135–145)

## 2021-12-01 LAB — MAGNESIUM: Magnesium: 2.2 mg/dL (ref 1.7–2.4)

## 2021-12-01 LAB — GLUCOSE, CAPILLARY
Glucose-Capillary: 205 mg/dL — ABNORMAL HIGH (ref 70–99)
Glucose-Capillary: 277 mg/dL — ABNORMAL HIGH (ref 70–99)

## 2021-12-01 MED ORDER — ACETAMINOPHEN 500 MG PO TABS: 500.0000 mg | ORAL_TABLET | Freq: Four times a day (QID) | ORAL | 0 refills | Status: AC | PRN

## 2021-12-01 MED ORDER — MIDODRINE HCL 5 MG PO TABS: 5.0000 mg | ORAL_TABLET | Freq: Three times a day (TID) | ORAL | 1 refills | Status: AC

## 2021-12-01 MED ORDER — INSULIN GLARGINE-YFGN 100 UNIT/ML ~~LOC~~ SOLN
5.0000 [IU] | Freq: Once | SUBCUTANEOUS | Status: AC
  Administered 2021-12-01: 5 [IU] via SUBCUTANEOUS
  Filled 2021-12-01: qty 0.05

## 2021-12-01 MED ORDER — MAGNESIUM OXIDE -MG SUPPLEMENT 400 (240 MG) MG PO TABS: 400.0000 mg | ORAL_TABLET | Freq: Every day | ORAL | 0 refills | Status: AC

## 2021-12-01 MED ORDER — INSULIN GLARGINE-YFGN 100 UNIT/ML ~~LOC~~ SOLN
32.0000 [IU] | Freq: Every day | SUBCUTANEOUS | Status: DC
  Filled 2021-12-01: qty 0.32

## 2021-12-01 MED ORDER — CEPHALEXIN 500 MG PO CAPS
500.0000 mg | ORAL_CAPSULE | Freq: Three times a day (TID) | ORAL | 0 refills | Status: AC
Start: 2021-12-01 — End: 2021-12-04

## 2021-12-01 MED ORDER — PROPRANOLOL HCL 20 MG PO TABS: 20.0000 mg | ORAL_TABLET | Freq: Three times a day (TID) | ORAL | 1 refills | Status: DC

## 2021-12-01 MED ORDER — MIDODRINE HCL 5 MG PO TABS
5.0000 mg | ORAL_TABLET | Freq: Three times a day (TID) | ORAL | Status: DC
  Filled 2021-12-01: qty 1

## 2021-12-01 NOTE — Progress Notes (Signed)
Occupational Therapy Treatment Patient Details Name: Laura Gentry MRN: [id number] DOB: [birth year] Today's Date: 12/01/2021   History of present illness Pt adm 7/5 with dizziness and fall. Pt with symptomatic anemia with a Hgb of 7.9.  Recent hospitalization for AMS, dka, sepsis 6/30-7/4;  L hip fx 6/11-6/16; sepsis 2/2 UTI 6/6-6/10.  PMH: cerebral aneurysm s/p clipping, diabetes, thyroid disease, orhtostatic hypotension.   OT comments  Pt progressing towards goals, requiring min guard for standing ADLs at sink and in room ambulation to toilet. Pt with soft BP during session (see below), and dizziness with initial stand needing to sit down. Pt presenting with impairments listed below, will follow acutely. Continue to recommend SNF at d/c.   BP sitting in chair: 98/82 BP standing: 109/78  BP standing 3 min: 90/63 BP when returned to chair: 126/69    Recommendations for follow up therapy are one component of a multi-disciplinary discharge planning process, led by the attending physician.  Recommendations may be updated based on patient status, additional functional criteria and insurance authorization.    Follow Up Recommendations  Skilled nursing-short term rehab (<3 hours/day)    Assistance Recommended at Discharge Frequent or constant Supervision/Assistance  Patient can return home with the following  A little help with walking and/or transfers;A little help with bathing/dressing/bathroom;Assistance with cooking/housework;Assist for transportation;Help with stairs or ramp for entrance   Equipment Recommendations  None recommended by OT    Recommendations for Other Services PT consult    Precautions / Restrictions Precautions Precautions: Fall Precaution Comments: watch bp Restrictions Weight Bearing Restrictions: No LUE Weight Bearing: Weight bearing as tolerated LLE Weight Bearing: Weight bearing as tolerated       Mobility Bed Mobility               General  bed mobility comments: OOB in chair upon arrival    Transfers Overall transfer level: Needs assistance Equipment used: Rolling walker (2 wheels) Transfers: Sit to/from Stand Sit to Stand: Supervision                 Balance Overall balance assessment: Needs assistance Sitting-balance support: No upper extremity supported Sitting balance-Leahy Scale: Good     Standing balance support: Bilateral upper extremity supported, During functional activity Standing balance-Leahy Scale: Fair                             ADL either performed or assessed with clinical judgement   ADL Overall ADL's : Needs assistance/impaired     Grooming: Min guard;Standing;Wash/dry face;Brushing hair;Oral care                   Toilet Transfer: Min guard;Ambulation;Comfort height toilet;Rolling walker (2 wheels) Toilet Transfer Details (indicate cue type and reason): with BSC over toilet Toileting- Clothing Manipulation and Hygiene: Supervision/safety;Sit to/from stand;Sitting/lateral lean Toileting - Clothing Manipulation Details (indicate cue type and reason): for pericare            Extremity/Trunk Assessment Upper Extremity Assessment Upper Extremity Assessment: Generalized weakness LUE Deficits / Details: Recent Elbow fx LUE Sensation: WNL LUE Coordination: decreased gross motor   Lower Extremity Assessment Lower Extremity Assessment: Defer to PT evaluation        Vision   Vision Assessment?: No apparent visual deficits   Perception Perception Perception: Not tested   Praxis Praxis Praxis: Not tested    Cognition Arousal/Alertness: Awake/alert Behavior During Therapy: WFL for tasks assessed/performed Overall Cognitive Status: Within Functional  Limits for tasks assessed Area of Impairment: Problem solving, Awareness, Safety/judgement                             Problem Solving: Slow processing, Requires verbal cues          Exercises       Shoulder Instructions       General Comments BP seated (98/82), standing (109/78), (90/63) after 3 mins, and (126/69) seated in chair after transfer/standing    Pertinent Vitals/ Pain       Pain Assessment Pain Assessment: No/denies pain Pain Intervention(s): Monitored during session  Home Living                                          Prior Functioning/Environment              Frequency  Min 2X/week        Progress Toward Goals  OT Goals(current goals can now be found in the care plan section)  Progress towards OT goals: Progressing toward goals  Acute Rehab OT Goals Patient Stated Goal: to get better OT Goal Formulation: With patient Time For Goal Achievement: 12/11/21 Potential to Achieve Goals: Good ADL Goals Pt Will Perform Grooming: with modified independence;standing Pt Will Perform Lower Body Bathing: with modified independence;sitting/lateral leans;sit to/from stand Pt Will Perform Upper Body Dressing: with modified independence;sitting Pt Will Perform Lower Body Dressing: with modified independence;sitting/lateral leans;sit to/from stand Pt Will Transfer to Toilet: with modified independence;ambulating Pt Will Perform Toileting - Clothing Manipulation and hygiene: with modified independence;sitting/lateral leans;sit to/from stand Pt Will Perform Tub/Shower Transfer: Tub transfer;Shower transfer;with modified independence;rolling walker;tub bench;ambulating Additional ADL Goal #1: Pt will complete 3 step trailmaking task in prep for ADLs  Plan Discharge plan remains appropriate;Frequency remains appropriate    Co-evaluation                 AM-PAC OT "6 Clicks" Daily Activity     Outcome Measure   Help from another person eating meals?: A Little Help from another person taking care of personal grooming?: A Little Help from another person toileting, which includes using toliet, bedpan, or urinal?: A Little Help from  another person bathing (including washing, rinsing, drying)?: A Little Help from another person to put on and taking off regular upper body clothing?: A Little Help from another person to put on and taking off regular lower body clothing?: A Little 6 Click Score: 18    End of Session Equipment Utilized During Treatment: Rolling walker (2 wheels)  OT Visit Diagnosis: Unsteadiness on feet (R26.81);Other abnormalities of gait and mobility (R26.89);Muscle weakness (generalized) (M62.81)   Activity Tolerance Patient tolerated treatment well   Patient Left in chair;with call bell/phone within reach   Nurse Communication Mobility status        Time: 9528-4132 OT Time Calculation (min): 25 min  Charges: OT General Charges $OT Visit: 1 Visit OT Treatments $Self Care/Home Management : 8-22 mins $Therapeutic Activity: 8-22 mins  Lynnda Child, OTD, OTR/L Acute Rehab [phone] - [place] 12/01/2021, 10:02 AM

## 2021-12-01 NOTE — Progress Notes (Signed)
Physical Therapy Treatment Patient Details Name: Laura Gentry MRN: [id number] DOB: [birth year] Today's Date: 12/01/2021   History of Present Illness Pt adm 7/5 with dizziness and fall. Pt with symptomatic anemia with a Hgb of 7.9.  Recent hospitalization for AMS, dka, sepsis 6/30-7/4;  L hip fx 6/11-6/16; sepsis 2/2 UTI 6/6-6/10.  PMH: cerebral aneurysm s/p clipping, diabetes, thyroid disease, orhtostatic hypotension.    PT Comments    Pt progressing towards physical therapy goals. Was able to perform transfers and ambulation with gross min assist and HHA for support during gait training. Although pt is functionally improving, continues to demonstrate decreased safety awareness and slower processing of more complex information. Pt eager to participate and is hopeful for d/c to SNF this afternoon and to get there in time for a therapy eval. Will continue to follow.    Recommendations for follow up therapy are one component of a multi-disciplinary discharge planning process, led by the attending physician.  Recommendations may be updated based on patient status, additional functional criteria and insurance authorization.  Follow Up Recommendations  Skilled nursing-short term rehab (<3 hours/day) Can patient physically be transported by private vehicle: Yes   Assistance Recommended at Discharge Intermittent Supervision/Assistance  Patient can return home with the following A little help with walking and/or transfers;Assist for transportation;Help with stairs or ramp for entrance   Equipment Recommendations  None recommended by PT    Recommendations for Other Services       Precautions / Restrictions Precautions Precautions: Fall Precaution Comments: watch bp Restrictions Weight Bearing Restrictions: No LUE Weight Bearing: Weight bearing as tolerated LLE Weight Bearing: Weight bearing as tolerated     Mobility  Bed Mobility               General bed mobility comments:  OOB in chair upon arrival    Transfers Overall transfer level: Needs assistance Equipment used: Rolling walker (2 wheels) Transfers: Sit to/from Stand Sit to Stand: Supervision           General transfer comment: No assist required for power up to full stand. No unsteadiness or LOB noted.    Ambulation/Gait Ambulation/Gait assistance: Min assist, Min guard Gait Distance (Feet): 200 Feet Assistive device: Rolling walker (2 wheels) Gait Pattern/deviations: Step-through pattern, Decreased step length - right, Decreased step length - left, Decreased stride length, Decreased weight shift to left Gait velocity: Decreased Gait velocity interpretation: 1.31 - 2.62 ft/sec, indicative of limited community ambulator   General Gait Details: Pt able to ambulate without UE support however appears antalgic. With HHA pt able to improve gait pattern. With heavier min assist through HHA gait pattern improved even more.   Stairs             Wheelchair Mobility    Modified Rankin (Stroke Patients Only)       Balance Overall balance assessment: Needs assistance Sitting-balance support: No upper extremity supported Sitting balance-Leahy Scale: Good     Standing balance support: Bilateral upper extremity supported, During functional activity Standing balance-Leahy Scale: Fair                              Cognition Arousal/Alertness: Awake/alert Behavior During Therapy: WFL for tasks assessed/performed Overall Cognitive Status: Within Functional Limits for tasks assessed Area of Impairment: Problem solving, Awareness, Safety/judgement  Safety/Judgement: Decreased awareness of safety Awareness: Emergent Problem Solving: Slow processing, Requires verbal cues          Exercises General Exercises - Lower Extremity Long Arc Quad: 15 reps Hip ABduction/ADduction: 15 reps (x15 abduction, x15 adduction isometric)    General Comments  General comments (skin integrity, edema, etc.): Pt reports no dizziness during OOB mobility      Pertinent Vitals/Pain Pain Assessment Pain Assessment: Faces Faces Pain Scale: Hurts a little bit Pain Location: L hip after ambulation Pain Descriptors / Indicators: Sore Pain Intervention(s): Limited activity within patient's tolerance, Monitored during session, Repositioned    Home Living                          Prior Function            PT Goals (current goals can now be found in the care plan section) Acute Rehab PT Goals Patient Stated Goal: go to rehab to get stronger PT Goal Formulation: With patient Time For Goal Achievement: 12/11/21 Potential to Achieve Goals: Good Progress towards PT goals: Progressing toward goals    Frequency    Min 3X/week      PT Plan Current plan remains appropriate    Co-evaluation              AM-PAC PT "6 Clicks" Mobility   Outcome Measure  Help needed turning from your back to your side while in a flat bed without using bedrails?: A Little Help needed moving from lying on your back to sitting on the side of a flat bed without using bedrails?: A Little Help needed moving to and from a bed to a chair (including a wheelchair)?: A Little Help needed standing up from a chair using your arms (e.g., wheelchair or bedside chair)?: A Little Help needed to walk in hospital room?: A Little Help needed climbing 3-5 steps with a railing? : A Little 6 Click Score: 18    End of Session Equipment Utilized During Treatment: Gait belt Activity Tolerance: Patient tolerated treatment well Patient left: in chair;with call bell/phone within reach Nurse Communication: Mobility status PT Visit Diagnosis: Other abnormalities of gait and mobility (R26.89);Muscle weakness (generalized) (M62.81) Pain - Right/Left: Left Pain - part of body: Leg;Hip     Time: 1412-1430 PT Time Calculation (min) (ACUTE ONLY): 18 min  Charges:  $Gait  Training: 8-22 mins                     Laura Gentry, PT, DPT Acute Rehabilitation Services Secure Chat Preferred Office: [phone]    Thelma Comp 12/01/2021, 3:29 PM

## 2021-12-01 NOTE — Progress Notes (Signed)
Re: Laura Gentry DOB:Feb 10, 1956 Date:12/01/2021   To Whom It May Concern:  Please be advised that the above-named patient will require a short-term nursing home stay--anticipated 30 days or less for rehabilitation and strengthening. The plan is for home.

## 2021-12-01 NOTE — TOC Transition Note (Signed)
Transition of Care [facility]) - CM/SW Discharge Note   Patient Details  Name: Laura Gentry MRN: [id number] Date of Birth: 06-02-1955  Transition of Care [facility]) CM/SW Contact:  Pollie Friar, RN Phone Number: 12/01/2021, 2:31 PM   Clinical Narrative:    Pt is discharging to [facility]. Bedside RN updated and pt will transport via [facility].   Number for report: [phone]   Final next level of care: Skilled Nursing Facility Barriers to Discharge: No Barriers Identified   Patient Goals and CMS Choice   CMS Medicare.gov Compare Post Acute Care list provided to:: Patient Choice offered to / list presented to : Patient  Discharge Placement                       Discharge Plan and Services     Post Acute Care Choice: [place]                               Social Determinants of Health (SDOH) Interventions     Readmission Risk Interventions    11/24/2021   11:10 AM  Readmission Risk Prevention Plan  Transportation Screening Complete  Home Care Screening Complete  Medication Review (RN CM) Complete

## 2021-12-01 NOTE — Discharge Summary (Signed)
Physician Discharge Summary  Hien Cunliffe HCW:[id number] DOB: [birth year] DOA: 11/26/2021  PCP: Seward Carol, MD  Admit date: 11/26/2021 Discharge date: 12/01/2021  Time spent: 55 minutes  Recommendations for Outpatient Follow-up:  Follow-up with MD at SNF.  Patient will need a basic metabolic profile, magnesium level, CBC done in 1 week to follow-up on electrolytes, renal function, hemoglobin.   Discharge Diagnoses:  Principal Problem:   Dizziness Active Problems:   Symptomatic anemia   Acute blood loss anemia   Controlled type 2 diabetes mellitus without complication, without long-term current use of insulin (HCC)   Hypomagnesemia   Orthostatic hypotension   Discharge Condition: Stable and improved  Diet recommendation: Heart healthy  Filed Weights   11/26/21 2233  Weight: 70.3 kg    History of present illness:  HPI per Dr. Mosie Epstein Nikesha Kwasny is a 66 y.o. female with medical history significant for type 2 diabetes, hyperlipidemia, essential hypertension, recently diagnosed and admitted for sepsis secondary to obstructing renal stone, left hip fracture post mechanical fall requiring surgical intervention who presents to [facility] ED with complaints of dizziness and a reported fall.  Denies hitting her head.  No reported fevers.  Work-up in the ED reveals drop in hemoglobin to 7.9 from baseline of 10.  1 U PRBCs ordered by EDP to be transfused.  EDP requested admission due to concern for symptomatic anemia.  The patient was admitted by the hospitalist service, [facility].   ED Course: Tmax 98.1.  BP 157/76.  Pulse 88.  Respiratory 20, O2 saturation 96% on room air.  Lab studies remarkable for WBC 11.5.  Hemoglobin 7.9.  MCV 78.  UA positive for pyuria.   Hospital Course:  1 dizziness, unclear etiology concern for symptomatic anemia versus hypovolemia, intravascular depletion versus orthostasis -Patient noted to have presented with dizziness, and a fall. -Concern for symptomatic  anemia as hemoglobin noted at 7.9 on admission from 8.7 (11/25/2021) -Patient denied any overt bleeding. -Status posttransfusion 1 unit packed red blood cells 11/27/2021 with hemoglobin stabilized at 10.5. -Repeat orthostatics done on 11/27/2021, per PT with some drop in blood pressure however patient remained asymptomatic. -Patient noted with chronic hypotension noted on midodrine. -Midodrine dose will be increased to 5 mg 3 times daily on discharge. -Patient's home regimen propanolol decreased to 20 mg 3 times daily. -Outpatient follow-up.  2.  Acute blood loss anemia -Baseline hemoglobin approximately 10. -Hemoglobin noted at 7.9 on presentation however was 8.7 during recent discharge (11/25/2021). -Status post transfusion 1 unit packed red blood cells hemoglobin stabilized at 10.5 by day of discharge.  -Anemia panel ordered with iron level of 24, ferritin of 91, TIBC of 211. -Patient maintained on PPI.  3.  Type 2 diabetes mellitus with hyperglycemia -Hemoglobin A1c 8.8 (10/29/2021). -Patient maintained on Semglee as well as sliding scale insulin during the hospitalization.   -Initially patient started at half home dose and dose uptitrated back to home regimen of 32 units daily by day of discharge.   -Outpatient follow-up.  4.  Hypomagnesemia/hypokalemia -Repleted during the hospitalization.   -Patient be discharged on oral magnesium tablets x7 days.  -Outpatient follow-up.    5.  History of orthostatic hypotension -Patient placed on TED hose.   -Home regimen of midodrine 5 mg twice daily was continued and home dose propanolol decreased to 20 mg 3 times daily.  -Patient improved clinically and will be discharged on midodrine 5 mg 3 times daily.   -Outpatient follow-up.   6.  History of recent  sepsis due to complicated UTI/pyelonephritis, POA -From recent discharge summary patient noted to have been discharged on Keflex 500 3 times daily for 7 more days on discharge however patient stated  pharmacy was not open on July 4 and has not had any antibiotics since discharge. -Patient maintained on Keflex 500 mg 3 times daily during the hospitalization with discharge on 3 more days of oral Keflex to complete a 7-day course of antibiotic treatment.    7.  Status post recent left hip fracture with repair -PT/OT. -Pain management. -Patient was discharged to skilled nursing facility.   8.  Essential tremors -Decreased home dose of propranolol to 20 mg 3 times daily, due to orthostatic hypotension. -Outpatient follow-up.    Procedures: CT head without contrast 11/26/2021 Plain films of the pelvis 11/26/2021 Chest x-ray 11/26/2021    Consultations: None  Discharge Exam: Vitals:   12/01/21 0732 12/01/21 1126  BP: (!) 163/77 125/72  Pulse: 72 67  Resp: 20 20  Temp: 98.9 F (37.2 C) 99.2 F (37.3 C)  SpO2: 95% 100%    General: NAD Cardiovascular: Regular rate rhythm no murmurs rubs or gallops.  No JVD.  No lower extremity edema. Respiratory: Clear to auscultation bilaterally.  No wheezes, no crackles, no rhonchi.  Discharge Instructions   Discharge Instructions     Diet - low sodium heart healthy   Complete by: As directed    Increase activity slowly   Complete by: As directed    No wound care   Complete by: As directed       Allergies as of 12/01/2021       Reactions   Amoxicillin Anaphylaxis, Shortness Of Breath   Bee Venom Shortness Of Breath   Penicillins Anaphylaxis, Swelling, Other (See Comments)   Remote reaction- Throat swelling - compromised airway Tolerates ceftriaxone   Pneumococcal Vaccines Other (See Comments)   Per patient due to Brain Aneurysm   Sulfa Antibiotics Hives, Itching, Swelling   Bactrim [sulfamethoxazole-trimethoprim] Hives, Itching, Swelling, Other (See Comments)   Felt off balance        Medication List     STOP taking these medications    propranolol ER 120 MG 24 hr capsule Commonly known as: INDERAL LA Replaced by:  propranolol 20 MG tablet       TAKE these medications    Accu-Chek Aviva Plus w/Device Kit The patient IS insulin requiring, ICD 10 code E11.9. The patient tests 4 times per day.   accu-chek soft touch lancets Use as instructed   acetaminophen 500 MG tablet Commonly known as: TYLENOL Take 1 tablet (500 mg total) by mouth every 6 (six) hours as needed for mild pain or headache. What changed: how much to take   alendronate 70 MG tablet Commonly known as: FOSAMAX Take 70 mg by mouth every Monday.   aspirin 325 MG tablet Take 1 tablet (325 mg total) by mouth daily for 28 days.   atorvastatin 40 MG tablet Commonly known as: LIPITOR Take 1 tablet (40 mg total) by mouth daily.   cephALEXin 500 MG capsule Commonly known as: KEFLEX Take 1 capsule (500 mg total) by mouth 3 (three) times daily for 3 days.   DULoxetine 60 MG capsule Commonly known as: CYMBALTA Take 60 mg by mouth daily.   gabapentin 300 MG capsule Commonly known as: NEURONTIN Take 300 mg in AM, 300 mg at noon, 600 mg at bedtime What changed:  how much to take how to take this when to take this additional  instructions   glucose blood test strip Commonly known as: Accu-Chek Aviva Plus Use as instructed. The patient IS insulin requiring, ICD 10 code E11.9. The patient tests 4 times per day.   Insulin Pen Needle 32G X 4 MM Misc Use to inject lantus once dialy   Lantus SoloStar 100 UNIT/ML Solostar Pen Generic drug: insulin glargine Inject 32 Units into the skin at bedtime.   magnesium oxide 400 (240 Mg) MG tablet Commonly known as: MAG-OX Take 1 tablet (400 mg total) by mouth daily for 7 days. Start taking on: December 02, 2021   midodrine 5 MG tablet Commonly known as: PROAMATINE Take 1 tablet (5 mg total) by mouth 3 (three) times daily with meals. What changed: when to take this   NovoLOG FlexPen 100 UNIT/ML FlexPen Generic drug: insulin aspart Before each meal 3 times a day, 140-199 - 2 units,  200-250 - 4 units, 251-299 - 6 units,  300-349 - 8 units,  350 or above 10 units.   pantoprazole 40 MG tablet Commonly known as: Protonix Take 1 tablet (40 mg total) by mouth daily.   propranolol 20 MG tablet Commonly known as: INDERAL Take 1 tablet (20 mg total) by mouth 3 (three) times daily. Replaces: propranolol ER 120 MG 24 hr capsule   traZODone 50 MG tablet Commonly known as: DESYREL Take 1 tablet (50 mg total) by mouth at bedtime as needed for sleep.   vitamin B-12 1000 MCG tablet Commonly known as: CYANOCOBALAMIN Take 1,000 mcg by mouth daily.       Allergies  Allergen Reactions   Amoxicillin Anaphylaxis and Shortness Of Breath   Bee Venom Shortness Of Breath   Penicillins Anaphylaxis, Swelling and Other (See Comments)    Remote reaction- Throat swelling - compromised airway Tolerates ceftriaxone   Pneumococcal Vaccines Other (See Comments)    Per patient due to Brain Aneurysm   Sulfa Antibiotics Hives, Itching and Swelling   Bactrim [Sulfamethoxazole-Trimethoprim] Hives, Itching, Swelling and Other (See Comments)    Felt off balance      The results of significant diagnostics from this hospitalization (including imaging, microbiology, ancillary and laboratory) are listed below for reference.    Significant Diagnostic Studies: CT Head Wo Contrast  Result Date: 11/26/2021 CLINICAL DATA:  Altered mental status and dizziness, initial encounter EXAM: CT HEAD WITHOUT CONTRAST TECHNIQUE: Contiguous axial images were obtained from the base of the skull through the vertex without intravenous contrast. RADIATION DOSE REDUCTION: This exam was performed according to the departmental dose-optimization program which includes automated exposure control, adjustment of the mA and/or kV according to patient size and/or use of iterative reconstruction technique. COMPARISON:  11/21/2021 FINDINGS: Brain: No evidence of acute infarction, hemorrhage, hydrocephalus, extra-axial collection  or mass lesion/mass effect. Mild atrophic changes are noted. Vascular: Aneurysm clip is again noted in the anterior aspect of the right temporal lobe Skull: Postsurgical changes are noted similar to that seen on the prior exam. No acute calvarial abnormality is noted. Sinuses/Orbits: No acute finding. Other: None. IMPRESSION: Chronic changes without acute abnormality. Electronically Signed   By: Inez Catalina M.D.   On: 11/26/2021 23:58   DG Pelvis Portable  Result Date: 11/26/2021 CLINICAL DATA:  Fall. EXAM: PORTABLE PELVIS 1-2 VIEWS COMPARISON:  11/14/2021 FINDINGS: There is no evidence of pelvic fracture or diastasis. There is no dislocation at the hips. Fixation hardware is noted in the proximal left femur without evidence of hardware loosening. No pelvic bone lesions are seen. IMPRESSION: No acute fracture or  dislocation. Electronically Signed   By: Brett Fairy M.D.   On: 11/26/2021 22:15   DG Chest Portable 1 View  Result Date: 11/26/2021 CLINICAL DATA:  Dizziness, fell, hypotension EXAM: PORTABLE CHEST 1 VIEW COMPARISON:  11/21/2021 FINDINGS: Single frontal view of the chest demonstrates an unremarkable cardiac silhouette. No acute airspace disease. Trace left pleural effusion versus pleural thickening. No pneumothorax. No acute displaced fracture. IMPRESSION: 1. Trace left pleural effusion versus pleural thickening. 2. Otherwise unremarkable exam. Electronically Signed   By: Randa Ngo M.D.   On: 11/26/2021 20:44   CT ABDOMEN PELVIS W CONTRAST  Result Date: 11/21/2021 CLINICAL DATA:  Abdominal pain, acute, nonlocalized. Patient was found on the floor. Altered mental status. EXAM: CT ABDOMEN AND PELVIS WITH CONTRAST TECHNIQUE: Multidetector CT imaging of the abdomen and pelvis was performed using the standard protocol following bolus administration of intravenous contrast. RADIATION DOSE REDUCTION: This exam was performed according to the departmental dose-optimization program which includes  automated exposure control, adjustment of the mA and/or kV according to patient size and/or use of iterative reconstruction technique. CONTRAST:  57m OMNIPAQUE IOHEXOL 300 MG/ML  SOLN COMPARISON:  CT of the abdomen pelvis 10/28/2021 FINDINGS: Lower chest: Mild dependent atelectasis is present the left base. The heart size is normal. No significant pleural or pericardial effusion is present. Hepatobiliary: Mild diffuse fatty infiltration liver is present. Patient is status post cholecystectomy. The common bile duct is within normal limits. Pancreas: Unremarkable. No pancreatic ductal dilatation or surrounding inflammatory changes. Spleen: Normal in size without focal abnormality. Adrenals/Urinary Tract: Adrenal glands are normal bilaterally. Bilateral nonobstructing calcifications are similar the prior study. The previously noted 5 mm stone in the midportion of the right kidney is no longer present. A nonobstructing 5 mm stone at the lower pole left kidney is present. There is some enhancement of the ureteral wall bilaterally. Bilateral renal simple cysts are present. Recommend a follow-up. Small amount of gas is present within the urinary bladder. Stomach/Bowel: The stomach and duodenum are within normal limits. Small bowel is unremarkable. Terminal ileum is within normal limits. Appendectomy noted. The ascending and transverse colon are within normal limits. Descending and sigmoid colon are unremarkable. Vascular/Lymphatic: Atherosclerotic calcifications are present the aorta branch vessels. No aneurysm is present. Reproductive: Status post hysterectomy. No adnexal masses. Other: No abdominal wall hernia or abnormality. No abdominopelvic ascites. Musculoskeletal: Disc disease again noted at L4-5 with central and foraminal stenosis. No focal osseous lesions are present. The bony pelvis is within normal limits. The hips are located and scratched at the hips are located bilaterally. Left proximal femur ORIF is again  noted. IMPRESSION: 1. Bilateral nonobstructing nephrolithiasis. 2. Enhancement of the ureteral wall bilaterally raises concern for urinary tract infection. This may represent some residual inflammatory change from recent stents. 3. Small amount of gas within the urinary bladder. This may be related to recent instrumentation. This could be related to infection. 4. Hepatic steatosis. 5. Cholecystectomy, hysterectomy, and appendectomy. 6. Degenerative changes in the lower lumbar spine are stable. 7. Aortic Atherosclerosis (ICD10-I70.0). Electronically Signed   By: CSan MorelleM.D.   On: 11/21/2021 17:15   DG Chest 2 View  Result Date: 11/21/2021 CLINICAL DATA:  Altered mental status, diabetes EXAM: CHEST - 2 VIEW COMPARISON:  11/02/2021 FINDINGS: Normal heart size, mediastinal contours, and pulmonary vascularity. Atherosclerotic calcification aorta. Lungs clear. No pulmonary infiltrate, pleural effusion, or pneumothorax. Osseous demineralization. IMPRESSION: No acute abnormalities. Aortic Atherosclerosis (ICD10-I70.0). Electronically Signed   By: MCrist InfanteD.  On: 11/21/2021 14:32   CT Cervical Spine Wo Contrast  Result Date: 11/21/2021 CLINICAL DATA:  Altered mental status.  Fall with pain. EXAM: CT HEAD WITHOUT CONTRAST CT CERVICAL SPINE WITHOUT CONTRAST TECHNIQUE: Multidetector CT imaging of the head and cervical spine was performed following the standard protocol without intravenous contrast. Multiplanar CT image reconstructions of the cervical spine were also generated. RADIATION DOSE REDUCTION: This exam was performed according to the departmental dose-optimization program which includes automated exposure control, adjustment of the mA and/or kV according to patient size and/or use of iterative reconstruction technique. COMPARISON:  11/02/2021. FINDINGS: CT HEAD FINDINGS Brain: No evidence of acute infarction, hemorrhage, hydrocephalus, extra-axial collection or mass lesion/mass effect.  Stable aneurysm clip above the anterior right temporal lobe. Vascular: No hyperdense vessel or unexpected calcification. Skull: Previous right frontotemporal craniotomy. No fracture. No bone lesion. Sinuses/Orbits: Globes and orbits are unremarkable. Sinuses are clear. Other: None. CT CERVICAL SPINE FINDINGS Alignment: Normal. Skull base and vertebrae: No acute fracture. No primary bone lesion or focal pathologic process. Soft tissues and spinal canal: No prevertebral fluid or swelling. No visible canal hematoma. Disc levels: Disc spaces are well maintained. No evidence of a disc herniation or of significant disc bulging. No stenosis. Upper chest: Negative. Other: None. IMPRESSION: HEAD CT 1. No acute intracranial abnormalities. CERVICAL CT 1. Normal. Electronically Signed   By: Lajean Manes M.D.   On: 11/21/2021 14:12   CT HEAD WO CONTRAST (5MM)  Result Date: 11/21/2021 CLINICAL DATA:  Altered mental status.  Fall with pain. EXAM: CT HEAD WITHOUT CONTRAST CT CERVICAL SPINE WITHOUT CONTRAST TECHNIQUE: Multidetector CT imaging of the head and cervical spine was performed following the standard protocol without intravenous contrast. Multiplanar CT image reconstructions of the cervical spine were also generated. RADIATION DOSE REDUCTION: This exam was performed according to the departmental dose-optimization program which includes automated exposure control, adjustment of the mA and/or kV according to patient size and/or use of iterative reconstruction technique. COMPARISON:  11/02/2021. FINDINGS: CT HEAD FINDINGS Brain: No evidence of acute infarction, hemorrhage, hydrocephalus, extra-axial collection or mass lesion/mass effect. Stable aneurysm clip above the anterior right temporal lobe. Vascular: No hyperdense vessel or unexpected calcification. Skull: Previous right frontotemporal craniotomy. No fracture. No bone lesion. Sinuses/Orbits: Globes and orbits are unremarkable. Sinuses are clear. Other: None. CT  CERVICAL SPINE FINDINGS Alignment: Normal. Skull base and vertebrae: No acute fracture. No primary bone lesion or focal pathologic process. Soft tissues and spinal canal: No prevertebral fluid or swelling. No visible canal hematoma. Disc levels: Disc spaces are well maintained. No evidence of a disc herniation or of significant disc bulging. No stenosis. Upper chest: Negative. Other: None. IMPRESSION: HEAD CT 1. No acute intracranial abnormalities. CERVICAL CT 1. Normal. Electronically Signed   By: Lajean Manes M.D.   On: 11/21/2021 14:12   DG C-Arm 1-60 Min-No Report  Result Date: 11/19/2021 Fluoroscopy was utilized by the requesting physician.  No radiographic interpretation.   DG C-Arm 1-60 Min-No Report  Result Date: 11/19/2021 Fluoroscopy was utilized by the requesting physician.  No radiographic interpretation.   DG HIP UNILAT WITH PELVIS 2-3 VIEWS LEFT  Result Date: 11/14/2021 CLINICAL DATA:  Postoperative hip pinning EXAM: DG HIP (WITH OR WITHOUT PELVIS) 2-3V LEFT COMPARISON:  November 04, 2021 FINDINGS: There is no evidence of hip fracture or dislocation. Pins are projected in the proximal left femur without malalignment. Stents in bilateral ureteral system are noted. IMPRESSION: Pins are projected in the proximal left femur without malalignment. Electronically  Signed   By: Abelardo Diesel M.D.   On: 11/14/2021 11:36   DG Wrist 2 Views Left  Result Date: 11/11/2021 CLINICAL DATA:  Left wrist pain.  Recent fall. EXAM: LEFT WRIST - 2 VIEW COMPARISON:  None Available. FINDINGS: Mild-to-moderate triscaphe and thumb carpometacarpal joint space narrowing. Neutral ulnar variance. No acute fracture is seen. No dislocation. IMPRESSION: Mild thumb carpometacarpal greater than triscaphe osteoarthritis. No acute fracture is seen. Electronically Signed   By: Yvonne Kendall M.D.   On: 11/11/2021 17:11   DG HIP UNILAT WITH PELVIS 2-3 VIEWS LEFT  Result Date: 11/04/2021 CLINICAL DATA:  Intraoperative  fluoroscopy for left side cannulated hip pinning. EXAM: DG HIP (WITH OR WITHOUT PELVIS) 2-3V LEFT COMPARISON:  Left hip radiographs 11/03/2021 and 11/02/2021 FINDINGS: Images were performed intraoperatively without the presence of a radiologist. Images demonstrate placement of 3 screws traversing the previously seen femoral neck fracture. Mild inferior displacement of the distal fracture component respect of the proximal fracture component appears similar to prior. No complication is seen. Total fluoroscopy images: 6 Total fluoroscopy time: 79 seconds Total dose: Radiation Exposure Index (as provided by the fluoroscopic device): 14.69 mGy air Kerma Please see intraoperative findings for further detail. IMPRESSION: Intraoperative fluoroscopy for proximal femoral ORIF. Electronically Signed   By: Yvonne Kendall M.D.   On: 11/04/2021 17:09   DG C-Arm 1-60 Min-No Report  Result Date: 11/04/2021 Fluoroscopy was utilized by the requesting physician.  No radiographic interpretation.   US RENAL  Result Date: 11/03/2021 CLINICAL DATA:  Oliguria after procedure EXAM: RENAL / URINARY TRACT ULTRASOUND COMPLETE COMPARISON:  CT chest abdomen and pelvis 05/30/2021. FINDINGS: Right Kidney: Renal measurements: 11.2 x 5.1 x 6.8 cm = volume: 130 mL. Mildly echogenic. No hydronephrosis. Superior pole cyst measures 1.3 x 1.1 x 1.3 cm. Inferior pole cyst measures 1.4 x 1.8 x 1.5 cm. Multiple scratched at 9 mm shadowing calculus identified in the right mid kidney, unchanged. Left Kidney: Renal measurements: 10.3 x 4.4 x 5.6 cm = volume: 132 mL. Mildly echogenic. Mild hydronephrosis. There are 2 cysts in the superior pole measuring up to 2.4 cm. 9 mm shadowing calculus identified. Bladder: Appears normal for degree of bladder distention. Other: None. IMPRESSION: 1. Mild left hydronephrosis. 2. Mildly echogenic kidneys may be related to medical renal disease. 3. Bilateral renal calculi and bilateral renal cysts. Electronically Signed    By: Ronney Asters M.D.   On: 11/03/2021 20:35   DG HIP PORT UNILAT WITH PELVIS 1V LEFT  Result Date: 11/03/2021 CLINICAL DATA:  Fall. Left hip pain. Left hip fracture. Preoperative radiograph. EXAM: DG HIP (WITH OR WITHOUT PELVIS) 1V PORT LEFT COMPARISON:  Left hip radiographs and CT of the left hip 11/02/2021. FINDINGS: Nondisplaced subcapital left femoral neck fracture again demonstrated. No new fractures present. The ureteral stents are in place bilaterally. IMPRESSION: Nondisplaced subcapital left femoral neck fracture. Electronically Signed   By: San Morelle M.D.   On: 11/03/2021 16:22   CT Hip Left Wo Contrast  Result Date: 11/02/2021 CLINICAL DATA:  Hip pain, stress fracture suspected, neg xray EXAM: CT OF THE LEFT HIP WITHOUT CONTRAST TECHNIQUE: Multidetector CT imaging of the left hip was performed according to the standard protocol. Multiplanar CT image reconstructions were also generated. RADIATION DOSE REDUCTION: This exam was performed according to the departmental dose-optimization program which includes automated exposure control, adjustment of the mA and/or kV according to patient size and/or use of iterative reconstruction technique. COMPARISON:  Same day radiograph FINDINGS: Bones/Joint/Cartilage  There is an acute left femoral neck fracture with impaction and minimal displacement, which courses through the lateral head neck junction and through the proximal third of the femoral neck medially. Small joint effusion. Ligaments Suboptimally assessed by CT. Muscles and Tendons No acute myotendinous abnormality by CT. Soft tissues Mild adjacent soft tissue swelling. IMPRESSION: Acute left subcapital/transcervical left femoral neck fracture with impaction and minimal displacement. Electronically Signed   By: Maurine Simmering M.D.   On: 11/02/2021 16:38   DG Chest 1 View  Result Date: 11/02/2021 CLINICAL DATA:  Hip fracture. EXAM: CHEST  1 VIEW COMPARISON:  None Available. FINDINGS:  Normal mediastinum and cardiac silhouette. Normal pulmonary vasculature. No evidence of effusion, infiltrate, or pneumothorax. No acute bony abnormality. IMPRESSION: No acute cardiopulmonary process. Electronically Signed   By: Suzy Bouchard M.D.   On: 11/02/2021 16:23   CT HEAD WO CONTRAST  Result Date: 11/02/2021 CLINICAL DATA:  Neuro deficit, acute, stroke suspected. Left sided weakness. EXAM: CT HEAD WITHOUT CONTRAST TECHNIQUE: Contiguous axial images were obtained from the base of the skull through the vertex without intravenous contrast. RADIATION DOSE REDUCTION: This exam was performed according to the departmental dose-optimization program which includes automated exposure control, adjustment of the mA and/or kV according to patient size and/or use of iterative reconstruction technique. COMPARISON:  Head CT 10/28/2021 and MRI 12/31/2019 FINDINGS: Brain: Within limitations of streak artifact from a right MCA aneurysm clip, there is no evidence of an acute infarct, intracranial hemorrhage, mass, midline shift, or extra-axial fluid collection. The ventricles are normal in size. Vascular: Prior aneurysm clipping in the right MCA bifurcation region. No hyperdense vessel. Skull: Right pterional craniotomy. Sinuses/Orbits: Paranasal sinuses and mastoid air cells are clear. Bilateral cataract extraction. Other: None. IMPRESSION: No evidence of acute intracranial abnormality. Electronically Signed   By: Logan Bores M.D.   On: 11/02/2021 15:19   DG Elbow Complete Left  Result Date: 11/02/2021 CLINICAL DATA:  Fall, pain EXAM: LEFT ELBOW - COMPLETE 3+ VIEW COMPARISON:  None Available. FINDINGS: Suspect a very subtle fracture of the left radial neck. There is no evidence of arthropathy or other focal bone abnormality. Soft tissues are unremarkable. IMPRESSION: Suspect a very subtle fracture of the left radial neck. Correlate for point tenderness. No elbow joint effusion. Electronically Signed   By: Delanna Ahmadi M.D.   On: 11/02/2021 15:16   DG Hip Unilat W or Wo Pelvis 2-3 Views Left  Result Date: 11/02/2021 CLINICAL DATA:  Fall, left hip pain EXAM: DG HIP (WITH OR WITHOUT PELVIS) 2-3V LEFT COMPARISON:  None Available. FINDINGS: Nondisplaced fracture of the left femoral neck, which appears to be transcervical or basicervical but is not clearly assessed given internal rotation on all views provided. No displaced fracture of the bony pelvis or proximal right femur seen in single frontal view. IMPRESSION: Nondisplaced fracture of the left femoral neck, which appears to be transcervical or basicervical but is not clearly assessed given internal rotation on all views provided. Consider additional radiographic views or CT to more clearly evaluate fracture anatomy. Electronically Signed   By: Delanna Ahmadi M.D.   On: 11/02/2021 15:14    Microbiology: Recent Results (from the past 240 hour(s))  Resp Panel by RT-PCR (Flu A&B, Covid) Anterior Nasal Swab     Status: None   Collection Time: 11/21/21  2:10 PM   Specimen: Anterior Nasal Swab  Result Value Ref Range Status   SARS Coronavirus 2 by RT PCR NEGATIVE NEGATIVE Final  Comment: (NOTE) SARS-CoV-2 target nucleic acids are NOT DETECTED.  The SARS-CoV-2 RNA is generally detectable in upper respiratory specimens during the acute phase of infection. The lowest concentration of SARS-CoV-2 viral copies this assay can detect is 138 copies/mL. A negative result does not preclude SARS-Cov-2 infection and should not be used as the sole basis for treatment or other patient management decisions. A negative result may occur with  improper specimen collection/handling, submission of specimen other than nasopharyngeal swab, presence of viral mutation(s) within the areas targeted by this assay, and inadequate number of viral copies(<138 copies/mL). A negative result must be combined with clinical observations, patient history, and epidemiological information.  The expected result is Negative.  Fact Sheet for Patients:  EntrepreneurPulse.com.au  Fact Sheet for Healthcare Providers:  IncredibleEmployment.be  This test is no t yet approved or cleared by the [place] FDA and  has been authorized for detection and/or diagnosis of SARS-CoV-2 by FDA under an Emergency Use Authorization (EUA). This EUA will remain  in effect (meaning this test can be used) for the duration of the COVID-19 declaration under Section 564(b)(1) of the Act, 21 U.S.C.section 360bbb-3(b)(1), unless the authorization is terminated  or revoked sooner.       Influenza A by PCR NEGATIVE NEGATIVE Final   Influenza B by PCR NEGATIVE NEGATIVE Final    Comment: (NOTE) The Xpert Xpress SARS-CoV-2/FLU/RSV plus assay is intended as an aid in the diagnosis of influenza from Nasopharyngeal swab specimens and should not be used as a sole basis for treatment. Nasal washings and aspirates are unacceptable for Xpert Xpress SARS-CoV-2/FLU/RSV testing.  Fact Sheet for Patients: EntrepreneurPulse.com.au  Fact Sheet for Healthcare Providers: IncredibleEmployment.be  This test is not yet approved or cleared by the [place] FDA and has been authorized for detection and/or diagnosis of SARS-CoV-2 by FDA under an Emergency Use Authorization (EUA). This EUA will remain in effect (meaning this test can be used) for the duration of the COVID-19 declaration under Section 564(b)(1) of the Act, 21 U.S.C. section 360bbb-3(b)(1), unless the authorization is terminated or revoked.  Performed at [facility], [place] [address], [place], [place] [address]   Urine Culture     Status: None   Collection Time: 11/21/21  3:05 PM   Specimen: Urine, Clean Catch  Result Value Ref Range Status   Specimen Description URINE, CLEAN CATCH  Final   Special Requests NONE  Final   Culture   Final    NO GROWTH Performed at  [facility], [place] [address], [place], [place] [address]    Report Status 11/23/2021 FINAL  Final     Labs: Basic Metabolic Panel: Recent Labs  Lab 11/26/21 2235 11/27/21 0412 11/28/21 0143 11/29/21 0203 11/30/21 0120 12/01/21 0215  NA  --  139 139 140 134* 137  K  --  3.3* 3.8 3.3* 3.9 3.7  CL  --  105 104 103 98 101  CO2  --  _0 GLUCOSE  --  153* 182* 221* 228* 194*  BUN  --  5* <5* 5* 9 9  CREATININE  --  0.72 0.71 0.96 0.89 0.98  CALCIUM  --  7.9* 8.1* 8.3* 8.3* 8.6*  MG 1.6* 1.5* 2.3  --  1.6* 2.2  PHOS  --  3.6  --   --   --   --    Liver Function Tests: Recent Labs  Lab 11/26/21 2042 11/27/21 0412  AST 17 16  ALT 26 26  ALKPHOS 106 101  BILITOT 0.8 0.6  PROT 5.6* 5.7*  ALBUMIN 1.9* 2.1*   No results for input(s): "LIPASE", "AMYLASE" in the last 168 hours. No results for input(s): "AMMONIA" in the last 168 hours. CBC: Recent Labs  Lab 11/25/21 0114 11/26/21 2042 11/27/21 0412 11/28/21 0143 11/29/21 0203  WBC 11.2* 11.5* 10.4 10.4 10.1  NEUTROABS 7.9* 8.5* 6.4  --   --   HGB 8.7* 7.9* 9.4* 10.1* 10.5*  HCT 28.3* 26.2* 29.2* 31.4* 33.5*  MCV 76.5* 78.4* 77.5* 76.4* 78.1*  PLT 317 370 385 441* 445*   Cardiac Enzymes: No results for input(s): "CKTOTAL", "CKMB", "CKMBINDEX", "TROPONINI" in the last 168 hours. BNP: BNP (last 3 results) No results for input(s): "BNP" in the last 8760 hours.  ProBNP (last 3 results) No results for input(s): "PROBNP" in the last 8760 hours.  CBG: Recent Labs  Lab 11/30/21 1202 11/30/21 1710 11/30/21 2056 12/01/21 0658 12/01/21 1125  GLUCAP 257* 112* 207* 205* 277*       Signed:  Irine Seal MD.  [facility] 12/01/2021, 1:40 PM

## 2021-12-02 DIAGNOSIS — G250 Essential tremor: Secondary | ICD-10-CM | POA: Diagnosis not present

## 2021-12-02 DIAGNOSIS — D62 Acute posthemorrhagic anemia: Secondary | ICD-10-CM | POA: Diagnosis not present

## 2021-12-02 DIAGNOSIS — E1165 Type 2 diabetes mellitus with hyperglycemia: Secondary | ICD-10-CM | POA: Diagnosis not present

## 2021-12-02 DIAGNOSIS — E119 Type 2 diabetes mellitus without complications: Secondary | ICD-10-CM | POA: Diagnosis not present

## 2021-12-08 DIAGNOSIS — N202 Calculus of kidney with calculus of ureter: Secondary | ICD-10-CM | POA: Diagnosis not present

## 2021-12-11 ENCOUNTER — Encounter (HOSPITAL_BASED_OUTPATIENT_CLINIC_OR_DEPARTMENT_OTHER): Payer: Medicare HMO | Admitting: Orthopaedic Surgery

## 2021-12-16 DIAGNOSIS — D62 Acute posthemorrhagic anemia: Secondary | ICD-10-CM | POA: Diagnosis not present

## 2021-12-16 DIAGNOSIS — I729 Aneurysm of unspecified site: Secondary | ICD-10-CM | POA: Diagnosis not present

## 2021-12-17 ENCOUNTER — Ambulatory Visit (INDEPENDENT_AMBULATORY_CARE_PROVIDER_SITE_OTHER): Payer: Medicare HMO

## 2021-12-17 ENCOUNTER — Ambulatory Visit (INDEPENDENT_AMBULATORY_CARE_PROVIDER_SITE_OTHER): Payer: Medicare HMO | Admitting: Orthopaedic Surgery

## 2021-12-17 ENCOUNTER — Other Ambulatory Visit (HOSPITAL_BASED_OUTPATIENT_CLINIC_OR_DEPARTMENT_OTHER): Payer: Self-pay | Admitting: Orthopaedic Surgery

## 2021-12-17 DIAGNOSIS — S72002D Fracture of unspecified part of neck of left femur, subsequent encounter for closed fracture with routine healing: Secondary | ICD-10-CM

## 2021-12-17 DIAGNOSIS — S7292XA Unspecified fracture of left femur, initial encounter for closed fracture: Secondary | ICD-10-CM | POA: Diagnosis not present

## 2021-12-17 NOTE — Progress Notes (Signed)
Post Operative Evaluation    Procedure/Date of Surgery: 11/04/21 Left cannulated screw placement  Interval History:   Presents today for follow-up of her right hip status post cannulated screw placement done 6 weeks prior.  Overall she is doing very well.  She has walked with essentially no pain.  She is using a cane for assistance.  Overall doing very well.   PMH/PSH/Family History/Social History/Meds/Allergies:    Past Medical History:  Diagnosis Date   Brain aneurysm 03/19/2013   Cataracts, bilateral    Cerebral aneurysm    Colon polyps    Diabetes ([place]) 2013   High cholesterol    History of kidney stones    Insomnia    Lumbosacral radiculopathy    Osteoporosis    Skin cancer    squamous   Thyroid disease    Tremor    Past Surgical History:  Procedure Laterality Date   APPENDECTOMY     BRAIN SURGERY  03/19/13   [place] aneyrsum clip can have MRI up to [place] scanned in    CHOLECYSTECTOMY  2013   [place], URETEROSCOPY AND STENT PLACEMENT Bilateral 10/29/2021   Procedure: [place], URETEROSCOPY AND STENT PLACEMENT;  Surgeon: Alexis Frock, MD;  Location: [facility];  Service: Urology;  Laterality: Bilateral;   CYSTOSCOPY WITH RETROGRADE PYELOGRAM, URETEROSCOPY AND STENT PLACEMENT Bilateral 11/19/2021   Procedure: CYSTOSCOPY WITH RETROGRADE PYELOGRAM, URETEROSCOPY AND STENT EXCHANGE, BASKET STONE REMOVAL;  Surgeon: Alexis Frock, MD;  Location: [facility];  Service: Urology;  Laterality: Bilateral;  75 MINS   HIP PINNING,CANNULATED Left 11/04/2021   Procedure: CANNULATED HIP PINNING;  Surgeon: Vanetta Mulders, MD;  Location: [facility];  Service: Orthopedics;  Laterality: Left;   HOLMIUM LASER APPLICATION Bilateral 11/25/5007   Procedure: HOLMIUM LASER APPLICATION;  Surgeon: Alexis Frock, MD;  Location: [facility];  Service: Urology;  Laterality: Bilateral;   INNER EAR SURGERY     Lost Hearing    TONSILLECTOMY AND ADENOIDECTOMY     VAGINAL HYSTERECTOMY     At age 63 due to "growth"   Social History   Socioeconomic History   Marital status: Divorced    Spouse name: Not on file   Number of children: 0   Years of education: Not on file   Highest education level: Some college, no degree  Occupational History   Occupation: Educational psychologist: [employer]    Comment: disabled  Tobacco Use   Smoking status: Former    Packs/day: 0.50    Years: 40.00    Total pack years: 20.00    Types: Cigarettes    Quit date: 05/26/2011    Years since quitting: 10.5   Smokeless tobacco: Never  Vaping Use   Vaping Use: Never used  Substance and Sexual Activity   Alcohol use: No    Alcohol/week: 0.0 standard drinks of alcohol   Drug use: No   Sexual activity: Never  Other Topics Concern   Not on file  Social History Narrative   09/08/21 her mom lives with her   Caffeine-tea   Social Determinants of Health   Financial Resource Strain: Not on file  Food Insecurity: Not on file  Transportation Needs: Not on file  Physical Activity: Not on file  Stress: Not on file  Social  Connections: Not on file   Family History  Problem Relation Age of Onset   Asthma Mother    Allergies Mother    Diabetes Mother    Lung cancer Father        smoked   Throat cancer Father        smoked   Pancreatic cancer Brother    Hypertension Maternal Grandfather    Diabetes Maternal Grandfather    Emphysema Maternal Aunt        never smoked, spouse did   Breast cancer Maternal Aunt    Breast cancer Maternal Aunt    Breast cancer Paternal Aunt    Allergies  Allergen Reactions   Amoxicillin Anaphylaxis and Shortness Of Breath   Bee Venom Shortness Of Breath   Penicillins Anaphylaxis, Swelling and Other (See Comments)    Remote reaction- Throat swelling - compromised airway Tolerates ceftriaxone   Pneumococcal Vaccines Other (See Comments)    Per patient due to Brain Aneurysm    Sulfa Antibiotics Hives, Itching and Swelling   Bactrim [Sulfamethoxazole-Trimethoprim] Hives, Itching, Swelling and Other (See Comments)    Felt off balance   Current Outpatient Medications  Medication Sig Dispense Refill   acetaminophen (TYLENOL) 500 MG tablet Take 1 tablet (500 mg total) by mouth every 6 (six) hours as needed for mild pain or headache. 30 tablet 0   alendronate (FOSAMAX) 70 MG tablet Take 70 mg by mouth every Monday.     atorvastatin (LIPITOR) 40 MG tablet Take 1 tablet (40 mg total) by mouth daily. 60 tablet 1   Blood Glucose Monitoring Suppl (ACCU-CHEK AVIVA PLUS) w/Device KIT The patient IS insulin requiring, ICD 10 code E11.9. The patient tests 4 times per day. 1 kit 0   DULoxetine (CYMBALTA) 60 MG capsule Take 60 mg by mouth daily.     gabapentin (NEURONTIN) 300 MG capsule Take 300 mg in AM, 300 mg at noon, 600 mg at bedtime (Patient taking differently: Take 300-600 mg by mouth See admin instructions. Take 300 mg by mouth in the morning, 300 mg at noon and 600 mg at bedtime) 120 capsule 0   glucose blood (ACCU-CHEK AVIVA PLUS) test strip Use as instructed. The patient IS insulin requiring, ICD 10 code E11.9. The patient tests 4 times per day. 100 each 12   insulin aspart (NOVOLOG) 100 UNIT/ML FlexPen Before each meal 3 times a day, 140-199 - 2 units, 200-250 - 4 units, 251-299 - 6 units,  300-349 - 8 units,  350 or above 10 units. 15 mL 0   insulin glargine (LANTUS SOLOSTAR) 100 UNIT/ML Solostar Pen Inject 32 Units into the skin at bedtime.     Insulin Pen Needle 32G X 4 MM MISC Use to inject lantus once dialy 100 each 4   Lancets (ACCU-CHEK SOFT TOUCH) lancets Use as instructed 100 each 12   midodrine (PROAMATINE) 5 MG tablet Take 1 tablet (5 mg total) by mouth 3 (three) times daily with meals. 90 tablet 1   pantoprazole (PROTONIX) 40 MG tablet Take 1 tablet (40 mg total) by mouth daily. 30 tablet 0   propranolol (INDERAL) 20 MG tablet Take 1 tablet (20 mg total) by mouth  3 (three) times daily. 90 tablet 1   traZODone (DESYREL) 50 MG tablet Take 1 tablet (50 mg total) by mouth at bedtime as needed for sleep. 30 tablet 0   vitamin B-12 (CYANOCOBALAMIN) 1000 MCG tablet Take 1,000 mcg by mouth daily.     No current facility-administered medications  for this visit.   No results found.  Review of Systems:   A ROS was performed including pertinent positives and negatives as documented in the HPI.   Musculoskeletal Exam:    There were no vitals taken for this visit.  Left hip incision is healed.  Range of motion is 20 degrees of internal and external rotation without pain.  There is 120 degrees of flexion actively.  2+ dorsalis pedis pulse.  Remainder of neurosensory exam is intact.  She has tenderness about the left first dorsal compartment of the wrist and hand with a positive Finkelstein's test.  Imaging:    X-ray AP pelvis left hip 3 views: Status post cannulated screw placement without evidence of complication with healing at the fracture site  I personally reviewed and interpreted the radiographs.   Assessment:   6 weeks status post left hip screw cannulated pinning x-rays today show further healing.  At this time she will continue to progress with physical therapy for strengthening and active range of motion as tolerated.  I will plan to see her back in 6 weeks for reassessment  Plan :    -Return to clinic in 6 weeks for reassessment     I personally saw and evaluated the patient, and participated in the management and treatment plan.  Vanetta Mulders, MD Attending Physician, Orthopedic Surgery  This document was dictated using Dragon voice recognition software. A reasonable attempt at proof reading has been made to minimize errors.

## 2021-12-18 DIAGNOSIS — N200 Calculus of kidney: Secondary | ICD-10-CM | POA: Diagnosis not present

## 2021-12-19 DIAGNOSIS — N200 Calculus of kidney: Secondary | ICD-10-CM | POA: Diagnosis not present

## 2022-01-13 DIAGNOSIS — N202 Calculus of kidney with calculus of ureter: Secondary | ICD-10-CM | POA: Diagnosis not present

## 2022-01-20 ENCOUNTER — Emergency Department (HOSPITAL_COMMUNITY): Payer: Medicare HMO

## 2022-01-20 ENCOUNTER — Encounter (HOSPITAL_COMMUNITY): Payer: Self-pay | Admitting: Emergency Medicine

## 2022-01-20 ENCOUNTER — Inpatient Hospital Stay (HOSPITAL_COMMUNITY)
Admission: EM | Admit: 2022-01-20 | Discharge: 2022-01-22 | DRG: 638 | Disposition: A | Discharge status: Home / Self Care | Payer: Medicare HMO | Attending: Internal Medicine | Admitting: Internal Medicine

## 2022-01-20 DIAGNOSIS — M810 Age-related osteoporosis without current pathological fracture: Secondary | ICD-10-CM | POA: Diagnosis present

## 2022-01-20 DIAGNOSIS — Z794 Long term (current) use of insulin: Secondary | ICD-10-CM | POA: Diagnosis not present

## 2022-01-20 DIAGNOSIS — E876 Hypokalemia: Secondary | ICD-10-CM | POA: Diagnosis present

## 2022-01-20 DIAGNOSIS — I951 Orthostatic hypotension: Secondary | ICD-10-CM | POA: Diagnosis present

## 2022-01-20 DIAGNOSIS — I10 Essential (primary) hypertension: Secondary | ICD-10-CM | POA: Diagnosis present

## 2022-01-20 DIAGNOSIS — Z888 Allergy status to other drugs, medicaments and biological substances status: Secondary | ICD-10-CM

## 2022-01-20 DIAGNOSIS — E785 Hyperlipidemia, unspecified: Secondary | ICD-10-CM | POA: Diagnosis not present

## 2022-01-20 DIAGNOSIS — E119 Type 2 diabetes mellitus without complications: Secondary | ICD-10-CM | POA: Diagnosis present

## 2022-01-20 DIAGNOSIS — D72829 Elevated white blood cell count, unspecified: Secondary | ICD-10-CM | POA: Diagnosis not present

## 2022-01-20 DIAGNOSIS — Z87891 Personal history of nicotine dependence: Secondary | ICD-10-CM | POA: Diagnosis not present

## 2022-01-20 DIAGNOSIS — Z87442 Personal history of urinary calculi: Secondary | ICD-10-CM | POA: Diagnosis not present

## 2022-01-20 DIAGNOSIS — E7800 Pure hypercholesterolemia, unspecified: Secondary | ICD-10-CM | POA: Diagnosis present

## 2022-01-20 DIAGNOSIS — E860 Dehydration: Secondary | ICD-10-CM | POA: Diagnosis present

## 2022-01-20 DIAGNOSIS — Z880 Allergy status to penicillin: Secondary | ICD-10-CM

## 2022-01-20 DIAGNOSIS — A419 Sepsis, unspecified organism: Secondary | ICD-10-CM

## 2022-01-20 DIAGNOSIS — Z960 Presence of urogenital implants: Secondary | ICD-10-CM | POA: Diagnosis present

## 2022-01-20 DIAGNOSIS — F32A Depression, unspecified: Secondary | ICD-10-CM | POA: Diagnosis not present

## 2022-01-20 DIAGNOSIS — Z882 Allergy status to sulfonamides status: Secondary | ICD-10-CM

## 2022-01-20 DIAGNOSIS — Z833 Family history of diabetes mellitus: Secondary | ICD-10-CM | POA: Diagnosis not present

## 2022-01-20 DIAGNOSIS — E1142 Type 2 diabetes mellitus with diabetic polyneuropathy: Secondary | ICD-10-CM | POA: Diagnosis present

## 2022-01-20 DIAGNOSIS — R6520 Severe sepsis without septic shock: Secondary | ICD-10-CM | POA: Diagnosis not present

## 2022-01-20 DIAGNOSIS — E1169 Type 2 diabetes mellitus with other specified complication: Secondary | ICD-10-CM | POA: Diagnosis not present

## 2022-01-20 DIAGNOSIS — N3289 Other specified disorders of bladder: Secondary | ICD-10-CM | POA: Diagnosis not present

## 2022-01-20 DIAGNOSIS — N179 Acute kidney failure, unspecified: Secondary | ICD-10-CM | POA: Diagnosis not present

## 2022-01-20 DIAGNOSIS — D509 Iron deficiency anemia, unspecified: Secondary | ICD-10-CM | POA: Diagnosis present

## 2022-01-20 DIAGNOSIS — Z79891 Long term (current) use of opiate analgesic: Secondary | ICD-10-CM

## 2022-01-20 DIAGNOSIS — Z887 Allergy status to serum and vaccine status: Secondary | ICD-10-CM

## 2022-01-20 DIAGNOSIS — G4700 Insomnia, unspecified: Secondary | ICD-10-CM | POA: Diagnosis not present

## 2022-01-20 DIAGNOSIS — I959 Hypotension, unspecified: Secondary | ICD-10-CM | POA: Diagnosis not present

## 2022-01-20 DIAGNOSIS — E1110 Type 2 diabetes mellitus with ketoacidosis without coma: Secondary | ICD-10-CM | POA: Diagnosis not present

## 2022-01-20 DIAGNOSIS — R531 Weakness: Secondary | ICD-10-CM | POA: Diagnosis not present

## 2022-01-20 DIAGNOSIS — Z8249 Family history of ischemic heart disease and other diseases of the circulatory system: Secondary | ICD-10-CM | POA: Diagnosis not present

## 2022-01-20 DIAGNOSIS — K2970 Gastritis, unspecified, without bleeding: Secondary | ICD-10-CM | POA: Diagnosis present

## 2022-01-20 DIAGNOSIS — Z90710 Acquired absence of both cervix and uterus: Secondary | ICD-10-CM

## 2022-01-20 DIAGNOSIS — Z79899 Other long term (current) drug therapy: Secondary | ICD-10-CM

## 2022-01-20 DIAGNOSIS — Z881 Allergy status to other antibiotic agents status: Secondary | ICD-10-CM

## 2022-01-20 DIAGNOSIS — Z634 Disappearance and death of family member: Secondary | ICD-10-CM

## 2022-01-20 DIAGNOSIS — Z9049 Acquired absence of other specified parts of digestive tract: Secondary | ICD-10-CM

## 2022-01-20 DIAGNOSIS — Z7983 Long term (current) use of bisphosphonates: Secondary | ICD-10-CM

## 2022-01-20 DIAGNOSIS — N281 Cyst of kidney, acquired: Secondary | ICD-10-CM | POA: Diagnosis not present

## 2022-01-20 DIAGNOSIS — R0602 Shortness of breath: Secondary | ICD-10-CM | POA: Diagnosis not present

## 2022-01-20 DIAGNOSIS — N200 Calculus of kidney: Secondary | ICD-10-CM | POA: Diagnosis present

## 2022-01-20 DIAGNOSIS — K2900 Acute gastritis without bleeding: Secondary | ICD-10-CM | POA: Diagnosis not present

## 2022-01-20 DIAGNOSIS — Z91030 Bee allergy status: Secondary | ICD-10-CM

## 2022-01-20 DIAGNOSIS — Z85828 Personal history of other malignant neoplasm of skin: Secondary | ICD-10-CM

## 2022-01-20 DIAGNOSIS — R000 Tachycardia, unspecified: Secondary | ICD-10-CM | POA: Diagnosis not present

## 2022-01-20 LAB — LACTIC ACID, PLASMA
Lactic Acid, Venous: 2.9 mmol/L (ref 0.5–1.9)
Lactic Acid, Venous: 1.3 mmol/L (ref 0.5–1.9)

## 2022-01-20 LAB — BASIC METABOLIC PANEL
Anion gap: 16 — ABNORMAL HIGH (ref 5–15)
Anion gap: 9 (ref 5–15)
BUN: 24 mg/dL — ABNORMAL HIGH (ref 8–23)
BUN: 18 mg/dL (ref 8–23)
CO2: 16 mmol/L — ABNORMAL LOW (ref 22–32)
CO2: 21 mmol/L — ABNORMAL LOW (ref 22–32)
Calcium: 8.7 mg/dL — ABNORMAL LOW (ref 8.9–10.3)
Calcium: 8.8 mg/dL — ABNORMAL LOW (ref 8.9–10.3)
Chloride: 105 mmol/L (ref 98–111)
Chloride: 106 mmol/L (ref 98–111)
Creatinine, Ser: 1.42 mg/dL — ABNORMAL HIGH (ref 0.44–1.00)
Creatinine, Ser: 0.9 mg/dL (ref 0.44–1.00)
GFR, Estimated: 41 mL/min — ABNORMAL LOW (ref >60)
GFR, Estimated: >60 mL/min (ref >60)
Glucose, Bld: 159 mg/dL — ABNORMAL HIGH (ref 70–99)
Glucose, Bld: 144 mg/dL — ABNORMAL HIGH (ref 70–99)
Potassium: 3.6 mmol/L (ref 3.5–5.1)
Potassium: 3.3 mmol/L — ABNORMAL LOW (ref 3.5–5.1)
Sodium: 137 mmol/L (ref 135–145)
Sodium: 136 mmol/L (ref 135–145)

## 2022-01-20 LAB — COMPREHENSIVE METABOLIC PANEL
ALT: 16 U/L (ref 0–44)
AST: 21 U/L (ref 15–41)
Albumin: 4.1 g/dL (ref 3.5–5.0)
Alkaline Phosphatase: 97 U/L (ref 38–126)
Anion gap: 21 — ABNORMAL HIGH (ref 5–15)
BUN: 26 mg/dL — ABNORMAL HIGH (ref 8–23)
CO2: 14 mmol/L — ABNORMAL LOW (ref 22–32)
Calcium: 9.6 mg/dL (ref 8.9–10.3)
Chloride: 101 mmol/L (ref 98–111)
Creatinine, Ser: 1.37 mg/dL — ABNORMAL HIGH (ref 0.44–1.00)
GFR, Estimated: 43 mL/min — ABNORMAL LOW (ref >60)
Glucose, Bld: 159 mg/dL — ABNORMAL HIGH (ref 70–99)
Potassium: 3.5 mmol/L (ref 3.5–5.1)
Sodium: 136 mmol/L (ref 135–145)
Total Bilirubin: 1.7 mg/dL — ABNORMAL HIGH (ref 0.3–1.2)
Total Protein: 8.3 g/dL — ABNORMAL HIGH (ref 6.5–8.1)

## 2022-01-20 LAB — LIPASE, BLOOD: Lipase: 56 U/L — ABNORMAL HIGH (ref 11–51)

## 2022-01-20 LAB — URINALYSIS, ROUTINE W REFLEX MICROSCOPIC
Bilirubin Urine: NEGATIVE
Glucose, UA: >=500 mg/dL — AB
Ketones, ur: 80 mg/dL — AB
Nitrite: NEGATIVE
Protein, ur: NEGATIVE mg/dL
Specific Gravity, Urine: 1.013 (ref 1.005–1.030)
pH: 5 (ref 5.0–8.0)

## 2022-01-20 LAB — CBC
HCT: 50.4 % — ABNORMAL HIGH (ref 36.0–46.0)
HCT: 35.8 % — ABNORMAL LOW (ref 36.0–46.0)
Hemoglobin: 15.2 g/dL — ABNORMAL HIGH (ref 12.0–15.0)
Hemoglobin: 11.7 g/dL — ABNORMAL LOW (ref 12.0–15.0)
MCH: 24.3 pg — ABNORMAL LOW (ref 26.0–34.0)
MCH: 24.7 pg — ABNORMAL LOW (ref 26.0–34.0)
MCHC: 30.2 g/dL (ref 30.0–36.0)
MCHC: 32.7 g/dL (ref 30.0–36.0)
MCV: 80.6 fL (ref 80.0–100.0)
MCV: 75.7 fL — ABNORMAL LOW (ref 80.0–100.0)
Platelets: 464 10*3/uL — ABNORMAL HIGH (ref 150–400)
Platelets: 312 10*3/uL (ref 150–400)
RBC: 6.25 MIL/uL — ABNORMAL HIGH (ref 3.87–5.11)
RBC: 4.73 MIL/uL (ref 3.87–5.11)
RDW: 17 % — ABNORMAL HIGH (ref 11.5–15.5)
RDW: 15.4 % (ref 11.5–15.5)
WBC: 16.2 10*3/uL — ABNORMAL HIGH (ref 4.0–10.5)
WBC: 10.1 10*3/uL (ref 4.0–10.5)
nRBC: 0 % (ref 0.0–0.2)
nRBC: 0 % (ref 0.0–0.2)

## 2022-01-20 LAB — CBG MONITORING, ED
Glucose-Capillary: 185 mg/dL — ABNORMAL HIGH (ref 70–99)
Glucose-Capillary: 200 mg/dL — ABNORMAL HIGH (ref 70–99)
Glucose-Capillary: 168 mg/dL — ABNORMAL HIGH (ref 70–99)
Glucose-Capillary: 195 mg/dL — ABNORMAL HIGH (ref 70–99)
Glucose-Capillary: 169 mg/dL — ABNORMAL HIGH (ref 70–99)
Glucose-Capillary: 154 mg/dL — ABNORMAL HIGH (ref 70–99)
Glucose-Capillary: 158 mg/dL — ABNORMAL HIGH (ref 70–99)
Glucose-Capillary: 164 mg/dL — ABNORMAL HIGH (ref 70–99)
Glucose-Capillary: 131 mg/dL — ABNORMAL HIGH (ref 70–99)
Glucose-Capillary: 121 mg/dL — ABNORMAL HIGH (ref 70–99)
Glucose-Capillary: 157 mg/dL — ABNORMAL HIGH (ref 70–99)
Glucose-Capillary: 162 mg/dL — ABNORMAL HIGH (ref 70–99)

## 2022-01-20 LAB — TROPONIN I (HIGH SENSITIVITY)
Troponin I (High Sensitivity): 13 ng/L (ref <18)
Troponin I (High Sensitivity): 17 ng/L (ref <18)

## 2022-01-20 LAB — BETA-HYDROXYBUTYRIC ACID: Beta-Hydroxybutyric Acid: 6.64 mmol/L — ABNORMAL HIGH (ref 0.05–0.27)

## 2022-01-20 MED ORDER — SODIUM CHLORIDE 0.9 % IV SOLN
2.0000 g | Freq: Two times a day (BID) | INTRAVENOUS | Status: DC
  Administered 2022-01-20: 2 g via INTRAVENOUS
  Filled 2022-01-20: qty 12.5

## 2022-01-20 MED ORDER — ONDANSETRON HCL 4 MG PO TABS: 4.0000 mg | ORAL_TABLET | Freq: Four times a day (QID) | ORAL | Status: DC | PRN

## 2022-01-20 MED ORDER — PROPRANOLOL HCL 20 MG PO TABS
20.0000 mg | ORAL_TABLET | Freq: Three times a day (TID) | ORAL | Status: DC
Start: 2022-01-20 — End: 2022-01-22
  Administered 2022-01-20 – 2022-01-22 (×4): 20 mg via ORAL
  Filled 2022-01-20 (×2): qty 1
  Filled 2022-01-20: qty 2
  Filled 2022-01-20 (×3): qty 1

## 2022-01-20 MED ORDER — SODIUM CHLORIDE 0.9 % IV SOLN: 2.0000 g | Freq: Once | INTRAVENOUS | Status: DC

## 2022-01-20 MED ORDER — VANCOMYCIN HCL 1500 MG/300ML IV SOLN
1500.0000 mg | Freq: Once | INTRAVENOUS | Status: AC
  Administered 2022-01-20: 1500 mg via INTRAVENOUS
  Filled 2022-01-20: qty 300

## 2022-01-20 MED ORDER — MIDODRINE HCL 5 MG PO TABS
5.0000 mg | ORAL_TABLET | Freq: Three times a day (TID) | ORAL | Status: DC
Start: 2022-01-20 — End: 2022-01-22
  Administered 2022-01-20 – 2022-01-22 (×5): 5 mg via ORAL
  Filled 2022-01-20 (×5): qty 1

## 2022-01-20 MED ORDER — ACETAMINOPHEN 325 MG PO TABS
650.0000 mg | ORAL_TABLET | Freq: Four times a day (QID) | ORAL | Status: DC | PRN
  Administered 2022-01-21: 650 mg via ORAL
  Filled 2022-01-20: qty 2

## 2022-01-20 MED ORDER — DEXTROSE IN LACTATED RINGERS 5 % IV SOLN: INTRAVENOUS | Status: DC

## 2022-01-20 MED ORDER — POTASSIUM CHLORIDE 10 MEQ/100ML IV SOLN
10.0000 meq | INTRAVENOUS | Status: AC
  Administered 2022-01-20 (×2): 10 meq via INTRAVENOUS
  Filled 2022-01-20 (×2): qty 100

## 2022-01-20 MED ORDER — DULOXETINE HCL 60 MG PO CPEP
60.0000 mg | ORAL_CAPSULE | Freq: Every day | ORAL | Status: DC
  Administered 2022-01-20 – 2022-01-22 (×3): 60 mg via ORAL
  Filled 2022-01-20 (×2): qty 1
  Filled 2022-01-20: qty 2

## 2022-01-20 MED ORDER — ACETAMINOPHEN 500 MG PO TABS
500.0000 mg | ORAL_TABLET | Freq: Four times a day (QID) | ORAL | Status: DC | PRN
Start: 2022-01-20 — End: 2022-01-20

## 2022-01-20 MED ORDER — SUCRALFATE 1 GM/10ML PO SUSP
1.0000 g | Freq: Three times a day (TID) | ORAL | Status: DC
  Administered 2022-01-20 – 2022-01-22 (×7): 1 g via ORAL
  Filled 2022-01-20 (×7): qty 10

## 2022-01-20 MED ORDER — GABAPENTIN 600 MG PO TABS
600.0000 mg | ORAL_TABLET | Freq: Every day | ORAL | Status: DC
  Administered 2022-01-21 (×2): 600 mg via ORAL
  Filled 2022-01-20 (×2): qty 1

## 2022-01-20 MED ORDER — PANTOPRAZOLE SODIUM 40 MG PO TBEC: 40.0000 mg | DELAYED_RELEASE_TABLET | Freq: Every day | ORAL | Status: DC

## 2022-01-20 MED ORDER — POTASSIUM CHLORIDE CRYS ER 20 MEQ PO TBCR
40.0000 meq | EXTENDED_RELEASE_TABLET | ORAL | Status: AC
  Administered 2022-01-20 – 2022-01-21 (×2): 40 meq via ORAL
  Filled 2022-01-20 (×2): qty 2

## 2022-01-20 MED ORDER — ONDANSETRON HCL 4 MG/2ML IJ SOLN
4.0000 mg | Freq: Once | INTRAMUSCULAR | Status: AC | PRN
  Administered 2022-01-20: 4 mg via INTRAVENOUS
  Filled 2022-01-20: qty 2

## 2022-01-20 MED ORDER — ENOXAPARIN SODIUM 40 MG/0.4ML IJ SOSY
40.0000 mg | PREFILLED_SYRINGE | INTRAMUSCULAR | Status: DC
  Administered 2022-01-20 – 2022-01-21 (×2): 40 mg via SUBCUTANEOUS
  Filled 2022-01-20 (×2): qty 0.4

## 2022-01-20 MED ORDER — VITAMIN B-12 1000 MCG PO TABS
1000.0000 ug | ORAL_TABLET | Freq: Every day | ORAL | Status: DC
  Administered 2022-01-20 – 2022-01-22 (×3): 1000 ug via ORAL
  Filled 2022-01-20 (×3): qty 1

## 2022-01-20 MED ORDER — VANCOMYCIN HCL IN DEXTROSE 1-5 GM/200ML-% IV SOLN: 1000.0000 mg | Freq: Once | INTRAVENOUS | Status: DC

## 2022-01-20 MED ORDER — PANTOPRAZOLE SODIUM 40 MG IV SOLR
40.0000 mg | Freq: Two times a day (BID) | INTRAVENOUS | Status: DC
  Administered 2022-01-20 – 2022-01-22 (×5): 40 mg via INTRAVENOUS
  Filled 2022-01-20 (×5): qty 10

## 2022-01-20 MED ORDER — FENTANYL CITRATE PF 50 MCG/ML IJ SOSY: 50.0000 ug | PREFILLED_SYRINGE | Freq: Once | INTRAMUSCULAR | Status: AC

## 2022-01-20 MED ORDER — MORPHINE SULFATE (PF) 2 MG/ML IV SOLN: 1.0000 mg | INTRAVENOUS | Status: DC | PRN

## 2022-01-20 MED ORDER — DEXTROSE 50 % IV SOLN: 0.0000 mL | INTRAVENOUS | Status: DC | PRN

## 2022-01-20 MED ORDER — TRAZODONE HCL 50 MG PO TABS
50.0000 mg | ORAL_TABLET | Freq: Every evening | ORAL | Status: DC | PRN
Start: 2022-01-20 — End: 2022-01-22

## 2022-01-20 MED ORDER — ENOXAPARIN SODIUM 40 MG/0.4ML IJ SOSY
40.0000 mg | PREFILLED_SYRINGE | INTRAMUSCULAR | Status: DC
Start: 2022-01-20 — End: 2022-01-20

## 2022-01-20 MED ORDER — LACTATED RINGERS IV BOLUS
1000.0000 mL | Freq: Once | INTRAVENOUS | Status: AC
  Administered 2022-01-20: 1000 mL via INTRAVENOUS

## 2022-01-20 MED ORDER — LORAZEPAM 2 MG/ML IJ SOLN
0.5000 mg | Freq: Once | INTRAMUSCULAR | Status: AC
  Administered 2022-01-20: 0.5 mg via INTRAVENOUS
  Filled 2022-01-20: qty 1

## 2022-01-20 MED ORDER — INSULIN REGULAR(HUMAN) IN NACL 100-0.9 UT/100ML-% IV SOLN
INTRAVENOUS | Status: DC
  Administered 2022-01-20: 3.2 [IU]/h via INTRAVENOUS
  Filled 2022-01-20: qty 100

## 2022-01-20 MED ORDER — ATORVASTATIN CALCIUM 40 MG PO TABS
40.0000 mg | ORAL_TABLET | Freq: Every day | ORAL | Status: DC
Start: 2022-01-20 — End: 2022-01-22
  Administered 2022-01-20 – 2022-01-22 (×3): 40 mg via ORAL
  Filled 2022-01-20 (×3): qty 1

## 2022-01-20 MED ORDER — FENTANYL CITRATE PF 50 MCG/ML IJ SOSY
PREFILLED_SYRINGE | INTRAMUSCULAR | Status: AC
  Administered 2022-01-20: 50 ug via INTRAVENOUS
  Filled 2022-01-20: qty 1

## 2022-01-20 MED ORDER — ACETAMINOPHEN 650 MG RE SUPP: 650.0000 mg | Freq: Four times a day (QID) | RECTAL | Status: DC | PRN

## 2022-01-20 MED ORDER — LACTATED RINGERS IV SOLN: INTRAVENOUS | Status: DC

## 2022-01-20 MED ORDER — GABAPENTIN 300 MG PO CAPS: 300.0000 mg | ORAL_CAPSULE | ORAL | Status: DC

## 2022-01-20 MED ORDER — ONDANSETRON HCL 4 MG/2ML IJ SOLN
INTRAMUSCULAR | Status: AC
  Filled 2022-01-20: qty 2

## 2022-01-20 MED ORDER — VANCOMYCIN HCL IN DEXTROSE 1-5 GM/200ML-% IV SOLN: 1000.0000 mg | INTRAVENOUS | Status: DC

## 2022-01-20 MED ORDER — METRONIDAZOLE 500 MG/100ML IV SOLN
500.0000 mg | Freq: Once | INTRAVENOUS | Status: AC
  Administered 2022-01-20: 500 mg via INTRAVENOUS
  Filled 2022-01-20: qty 100

## 2022-01-20 MED ORDER — ONDANSETRON HCL 4 MG/2ML IJ SOLN
4.0000 mg | Freq: Once | INTRAMUSCULAR | Status: AC
  Administered 2022-01-20: 4 mg via INTRAVENOUS

## 2022-01-20 MED ORDER — GABAPENTIN 300 MG PO CAPS
300.0000 mg | ORAL_CAPSULE | Freq: Two times a day (BID) | ORAL | Status: DC
  Administered 2022-01-20 – 2022-01-22 (×3): 300 mg via ORAL
  Filled 2022-01-20 (×4): qty 1

## 2022-01-20 MED ORDER — ONDANSETRON HCL 4 MG/2ML IJ SOLN: 4.0000 mg | Freq: Four times a day (QID) | INTRAMUSCULAR | Status: DC | PRN

## 2022-01-20 NOTE — ED Triage Notes (Signed)
N/V x 3 days, unable to keep down fluids. Noted tachycardic at 150bpm.  Previous episode of same, was in DKA at that time. Has not taken any insulin or oral agents over the last 3 days. Last BM yesterday, emesis green today.

## 2022-01-20 NOTE — Assessment & Plan Note (Signed)
Continue with duloxetine.

## 2022-01-20 NOTE — Assessment & Plan Note (Signed)
Acute gastritis  Plan to continue antiacid therapy with pantoprazole, initially will be IV and then transition to po. Continue with as needed morphine for pain control and as needed antiemetics with zofran. Qid sucralfate.  Clear liquid diet and advance as tolerated.

## 2022-01-20 NOTE — Progress Notes (Signed)
Notified bedside nurse of need to draw repeat lactic acid. 

## 2022-01-20 NOTE — ED Provider Notes (Signed)
I saw and evaluated the patient, reviewed the resident's note and I agree with the findings and plan.  EKG Interpretation  Date/Time:  Tuesday January 20 2022 06:59:04 EDT Ventricular Rate:  143 PR Interval:    QRS Duration: 80 QT Interval:  350 QTC Calculation: 540 R Axis:   78 Text Interpretation: Supraventricular tachycardia ST depression, consider subendocardial injury Abnormal ECG When compared with ECG of 26-Nov-2021 19:16, PREVIOUS ECG IS PRESENT Confirmed by Lacretia Leigh ([id number]) on 01/20/2022 7:45:22 AM    Patient presents with emesis and upper abdominal pain times several days.  History of diabetes.  Believes he has been nonbilious or bloody.  On exam she has diffuse abdominal tenderness.  No evidence of peritonitis.  Plan will be to give IV fluids, check blood work, imaging and patient will require admission   Lacretia Leigh, MD 01/20/22 [phone]

## 2022-01-20 NOTE — Progress Notes (Signed)
Notified bedside nurse of need to administer antibiotics.  

## 2022-01-20 NOTE — Assessment & Plan Note (Addendum)
No clinical signs of infection.  Patient has received empiric IV antibiotic therapy in the ED. Sepsis is ruled out for now. Check urine analysis  Plan to hold on antibiotic therapy for now and continue DKA treatment. Follow up cell count in am.

## 2022-01-20 NOTE — H&P (Signed)
History and Physical    Patient: Laura Gentry KGU:[id number] DOB: April 17, 1956 DOA: 01/20/2022 DOS: the patient was seen and examined on 01/20/2022 PCP: Seward Carol, MD  Patient coming from: Home  Chief Complaint: No chief complaint on file.  HPI: Laura Gentry is a 66 y.o. female with medical history significant of T2DM,  dyslipidemia and hypotension/ hypertension, renal stones who presented with abdominal pain, nausea and vomiting.  Her symptoms started 3 days prior to admission with abdominal pain and dyspepsia, along with worsening reflux symptoms. She had burning type abdominal pain in the epigastrium, moderate to severe in intensity with no improving or worsening factors. It was associated with dizziness and generalized weakness.  She has been placed recently on indapamide for renal stones by her primary care provider.  Over last 3 days she has not been able to eat solids or drink liquids due to persistent nausea and vomiting.  She has held her insulin, because a was to weak to give herself the injections. No fevers, no dyspnea, cough or dysuria.   Recent hospitalization on 11/2021 due to symptomatic anemia and orthostatic hypotension.      Review of Systems: As mentioned in the history of present illness. All other systems reviewed and are negative. Past Medical History:  Diagnosis Date   Brain aneurysm 03/19/2013   Cataracts, bilateral    Cerebral aneurysm    Colon polyps    Diabetes ([place]) 2013   High cholesterol    History of kidney stones    Insomnia    Lumbosacral radiculopathy    Osteoporosis    Skin cancer    squamous   Thyroid disease    Tremor    Past Surgical History:  Procedure Laterality Date   APPENDECTOMY     BRAIN SURGERY  03/19/13   [place] aneyrsum clip can have MRI up to [place] scanned in    CHOLECYSTECTOMY  2013   [place], URETEROSCOPY AND STENT PLACEMENT Bilateral 10/29/2021   Procedure: [place], URETEROSCOPY AND STENT PLACEMENT;  Surgeon: Alexis Frock, MD;  Location: [facility];  Service: Urology;  Laterality: Bilateral;   CYSTOSCOPY WITH RETROGRADE PYELOGRAM, URETEROSCOPY AND STENT PLACEMENT Bilateral 11/19/2021   Procedure: CYSTOSCOPY WITH RETROGRADE PYELOGRAM, URETEROSCOPY AND STENT EXCHANGE, BASKET STONE REMOVAL;  Surgeon: Alexis Frock, MD;  Location: [facility];  Service: Urology;  Laterality: Bilateral;  75 MINS   HIP PINNING,CANNULATED Left 11/04/2021   Procedure: CANNULATED HIP PINNING;  Surgeon: Vanetta Mulders, MD;  Location: [facility];  Service: Orthopedics;  Laterality: Left;   HOLMIUM LASER APPLICATION Bilateral 11/19/3149   Procedure: HOLMIUM LASER APPLICATION;  Surgeon: Alexis Frock, MD;  Location: [facility];  Service: Urology;  Laterality: Bilateral;   INNER EAR SURGERY     Lost Hearing   TONSILLECTOMY AND ADENOIDECTOMY     VAGINAL HYSTERECTOMY     At age 64 due to "growth"   Social History:  reports that she quit smoking about 10 years ago. Her smoking use included cigarettes. She has a 20.00 pack-year smoking history. She has never used smokeless tobacco. She reports that she does not drink alcohol and does not use drugs.  Allergies  Allergen Reactions   Amoxicillin Anaphylaxis and Shortness Of Breath   Bee Venom Shortness Of Breath   Penicillins Anaphylaxis, Swelling and Other (See Comments)    Remote reaction- Throat swelling - compromised airway Tolerates ceftriaxone   Pneumococcal Vaccines Other (See Comments)    Per patient due to Brain  Aneurysm   Sulfa Antibiotics Hives, Itching and Swelling   Bactrim [Sulfamethoxazole-Trimethoprim] Hives, Itching, Swelling and Other (See Comments)    Felt off balance    Family History  Problem Relation Age of Onset   Asthma Mother    Allergies Mother    Diabetes Mother    Lung cancer Father        smoked   Throat cancer Father        smoked   Pancreatic cancer Brother    Hypertension Maternal  Grandfather    Diabetes Maternal Grandfather    Emphysema Maternal Aunt        never smoked, spouse did   Breast cancer Maternal Aunt    Breast cancer Maternal Aunt    Breast cancer Paternal Aunt     Prior to Admission medications   Medication Sig Start Date End Date Taking? Authorizing Provider  acetaminophen (TYLENOL) 500 MG tablet Take 1 tablet (500 mg total) by mouth every 6 (six) hours as needed for mild pain or headache. 12/01/21  Yes Eugenie Filler, MD  atorvastatin (LIPITOR) 40 MG tablet Take 1 tablet (40 mg total) by mouth daily. 01/31/18  Yes Lorella Nimrod, MD  DULoxetine (CYMBALTA) 60 MG capsule Take 60 mg by mouth daily.   Yes [provider]  ferrous sulfate 325 (65 FE) MG tablet Take 325 mg by mouth daily with breakfast.   Yes [provider]  gabapentin (NEURONTIN) 300 MG capsule Take 300 mg in AM, 300 mg at noon, 600 mg at bedtime Patient taking differently: Take 300-600 mg by mouth See admin instructions. Take 300 mg by mouth in the morning, 300 mg at noon and 600 mg at bedtime 09/21/16  Yes Rivet, Carly J, MD  indapamide (LOZOL) 1.25 MG tablet Take 1.25 mg by mouth daily. 01/13/22  Yes [provider]  insulin aspart (NOVOLOG) 100 UNIT/ML FlexPen Before each meal 3 times a day, 140-199 - 2 units, 200-250 - 4 units, 251-299 - 6 units,  300-349 - 8 units,  350 or above 10 units. 11/24/21  Yes Thurnell Lose, MD  insulin glargine (LANTUS SOLOSTAR) 100 UNIT/ML Solostar Pen Inject 32 Units into the skin at bedtime. 11/24/21  Yes Thurnell Lose, MD  midodrine (PROAMATINE) 5 MG tablet Take 1 tablet (5 mg total) by mouth 3 (three) times daily with meals. 12/01/21  Yes Eugenie Filler, MD  propranolol (INDERAL) 20 MG tablet Take 1 tablet (20 mg total) by mouth 3 (three) times daily. 12/01/21  Yes Eugenie Filler, MD  thiamine (VITAMIN B-1) 100 MG tablet Take 100 mg by mouth daily.   Yes [provider]  vitamin B-12 (CYANOCOBALAMIN) 1000 MCG  tablet Take 1,000 mcg by mouth daily.   Yes [provider]  Blood Glucose Monitoring Suppl (ACCU-CHEK AVIVA PLUS) w/Device KIT The patient IS insulin requiring, ICD 10 code E11.9. The patient tests 4 times per day. 06/04/17   Lenore Cordia, MD  glucose blood (ACCU-CHEK AVIVA PLUS) test strip Use as instructed. The patient IS insulin requiring, ICD 10 code E11.9. The patient tests 4 times per day. 09/23/16   Rivet, Sindy [place], MD  Insulin Pen Needle 32G X 4 MM MISC Use to inject lantus once dialy 10/29/17   Lorella Nimrod, MD  Lancets (ACCU-CHEK SOFT TOUCH) lancets Use as instructed 09/23/16   Rivet, Sindy Guadel[place], MD  pantoprazole (PROTONIX) 40 MG tablet Take 1 tablet (40 mg total) by mouth daily. Patient not taking: Reported on  01/20/2022 11/07/21 12/07/21  Terrilee Croak, MD  traZODone (DESYREL) 50 MG tablet Take 1 tablet (50 mg total) by mouth at bedtime as needed for sleep. Patient not taking: Reported on 01/20/2022 11/01/21   Allie Bossier, MD    Physical Exam: Vitals:   01/20/22 0900 01/20/22 0930 01/20/22 1130 01/20/22 1146  BP: (!) 113/48 (!) 146/84 (!) 152/82 (!) 157/89  Pulse: (!) 124 (!) 123 (!) 124 (!) 124  Resp: 17 (!) 23 20 16   Temp:    98.4 F (36.9 C)  TempSrc:    Oral  SpO2: 97% 99% 95% 95%  Weight:       Neurology awake and alert, ill looking appearing and deconditioned ENT with dry mucous membranes with mild pallor but no icterus Cardiovascular with S1 and S2 present and tachycardic with no gallops or rubs, mild systolic murmur at the apex Respiratory with no rales or wheezing Abdomen with no distention, mild tender to deep palpation at the epigastrium, with no rebound or guarding No lower extremity edema.   Data Reviewed:    Assessment and Plan:  Diabetic ketoacidosis.  65 yo female with insulin dependent type 2 diabetes mellitus who developed abdominal pain, nausea and vomiting for the last 3 days, not able to eat or drink due to GI symptoms. Not able to use insulin  due to generalized weakness. On examination is tachycardic and dry mucous membranes, positive abdominal pain in the epigastrium.   Na 136, K 3,5 Cl 101 bicarbonate 14, glucose 159, bun 26 cr 1,37 anion gap 21.  Lipase 56  AST 21, ALT 16  High sensitive troponin 13 and 17  Lactic acid 2,9  Wbc 16,2 hgb 15,2 plt 464   Chest radiograph with no infiltrates.  CT abdomen and pelvis with no acute findings, bilateral non obstructing renal stones.   EKG 143 bpm, normal axis, qtc 540, sinus rhythm with poor R R progression, ST depression V3 to V6 with no significant T wave changes.   * DKA (diabetic ketoacidosis) ([place]) Patient has received IV fluids and has been started on IV insulin therapy, Her nausea and vomiting have improved but not resolved.  Follow up anion gap is down to 16. Mild elevated lipase but Ct abdomen with no signs of pancreatic inflammation.   Plan to continue insulin drip for now until anion gap closes and patient able to tolerate po. Advance diet to liquids Check BMP at 15:00 hrs Continue glucose monitoring per protocol.   Gastritis Acute gastritis  Plan to continue antiacid therapy with pantoprazole, initially will be IV and then transition to po. Continue with as needed morphine for pain control and as needed antiemetics with zofran. Qid sucralfate.  Clear liquid diet and advance as tolerated.   Leukocytosis No clinical signs of infection.  Patient has received empiric IV antibiotic therapy in the ED. Sepsis is ruled out for now. Check urine analysis  Plan to hold on antibiotic therapy for now and continue DKA treatment. Follow up cell count in am.   Hypotension Patient with orthostatic hypotension, Continue IV fluids and resume midodrine.  Resume propranolol to prevent rebound tachycardia.   Iron deficiency anemia Continue with iron supplementation.   Type 2 diabetes mellitus with hyperlipidemia ([facility]) Continue insulin therapy IV for now, then transition  to sq when DKA resolves.  Continue with statin therapy.  Diabetic neuropathy continue with gabapentin.   Depression Continue with duloxetine.       Advance Care Planning:   Code Status: Full  Code   Consults: none   Family Communication: I spoke with patient's sister at the bedside, we talked in detail about patient's condition, plan of care and prognosis and all questions were addressed.   Severity of Illness: The appropriate patient status for this patient is INPATIENT. Inpatient status is judged to be reasonable and necessary in order to provide the required intensity of service to ensure the patient's safety. The patient's presenting symptoms, physical exam findings, and initial radiographic and laboratory data in the context of their chronic comorbidities is felt to place them at high risk for further clinical deterioration. Furthermore, it is not anticipated that the patient will be medically stable for discharge from the hospital within 2 midnights of admission.   * I certify that at the point of admission it is my clinical judgment that the patient will require inpatient hospital care spanning beyond 2 midnights from the point of admission due to high intensity of service, high risk for further deterioration and high frequency of surveillance required.*  Author: Tawni Millers, MD 01/20/2022 2:06 PM  For on call review [email or web address].

## 2022-01-20 NOTE — ED Provider Notes (Signed)
Ultrasound ED Peripheral IV (Provider)  Date/Time: 01/20/2022 10:06 AM  Performed by: Tedd Sias, PA Authorized by: Tedd Sias, PA   Procedure details:    Indications: multiple failed IV attempts     Skin Prep: chlorhexidine gluconate     Location:  Right AC   Angiocath:  20 G   Bedside Ultrasound Guided: Yes     Images: not archived     Patient tolerated procedure without complications: Yes     Dressing applied: Yes   Comments:     1st attempt     Tedd Sias, PA 01/20/22 1007    Lacretia Leigh, MD 01/21/22 0710

## 2022-01-20 NOTE — Assessment & Plan Note (Addendum)
Patient with orthostatic hypotension, Continue IV fluids and resume midodrine.  Resume propranolol to prevent rebound tachycardia.

## 2022-01-20 NOTE — Progress Notes (Signed)
Elink following code sepsis °

## 2022-01-20 NOTE — Assessment & Plan Note (Addendum)
Patient has received IV fluids and has been started on IV insulin therapy, Her nausea and vomiting have improved but not resolved.  Follow up anion gap is down to 16. Mild elevated lipase but Ct abdomen with no signs of pancreatic inflammation.   Plan to continue insulin drip for now until anion gap closes and patient able to tolerate po. Advance diet to liquids Check BMP at 15:00 hrs Continue glucose monitoring per protocol.

## 2022-01-20 NOTE — ED Provider Notes (Signed)
[facility] EMERGENCY DEPARTMENT Provider Note   CSN: [id number] Arrival date & time: 01/20/22  2585     History No chief complaint on file.   Laura Gentry is a 66 y.o. female with history of T2DM, HLD, HTN, acute blood loss anemia presenting with chest and abdominal pain in the setting of persistent nausea and vomiting. Patient notes symptoms started on Sunday and she wasn't feeling well and started having nausea and vomiting for several days. She hasn't been able to eat or drink much in 3 days and has not taken her insulin because of that.  Patient lives with her sister and patient did not tell her sister of not feeling well until yesterday. Sister does report that she was having some dizziness prior to all of this started. There has been a lot of stress lately in the family as their mother passed away in the last month and her sister typically doesn't want assistance or help so when she gets sick it tends to go downhill quickly.  Patient reports abdominal pain that radiates into her chest and is severely short of breath and tachycardic.  HPI     Home Medications Prior to Admission medications   Medication Sig Start Date End Date Taking? Authorizing Provider  acetaminophen (TYLENOL) 500 MG tablet Take 1 tablet (500 mg total) by mouth every 6 (six) hours as needed for mild pain or headache. 12/01/21   Eugenie Filler, MD  alendronate (FOSAMAX) 70 MG tablet Take 70 mg by mouth every Monday. 09/27/21   [provider]  atorvastatin (LIPITOR) 40 MG tablet Take 1 tablet (40 mg total) by mouth daily. 01/31/18   Lorella Nimrod, MD  Blood Glucose Monitoring Suppl (ACCU-CHEK AVIVA PLUS) w/Device KIT The patient IS insulin requiring, ICD 10 code E11.9. The patient tests 4 times per day. 06/04/17   Lenore Cordia, MD  DULoxetine (CYMBALTA) 60 MG capsule Take 60 mg by mouth daily.    [provider]  gabapentin (NEURONTIN) 300 MG capsule Take 300 mg in AM, 300  mg at noon, 600 mg at bedtime Patient taking differently: Take 300-600 mg by mouth See admin instructions. Take 300 mg by mouth in the morning, 300 mg at noon and 600 mg at bedtime 09/21/16   Rivet, Carly J, MD  glucose blood (ACCU-CHEK AVIVA PLUS) test strip Use as instructed. The patient IS insulin requiring, ICD 10 code E11.9. The patient tests 4 times per day. 09/23/16   Rivet, Sindy Guadeloupe, MD  insulin aspart (NOVOLOG) 100 UNIT/ML FlexPen Before each meal 3 times a day, 140-199 - 2 units, 200-250 - 4 units, 251-299 - 6 units,  300-349 - 8 units,  350 or above 10 units. 11/24/21   Thurnell Lose, MD  insulin glargine (LANTUS SOLOSTAR) 100 UNIT/ML Solostar Pen Inject 32 Units into the skin at bedtime. 11/24/21   Thurnell Lose, MD  Insulin Pen Needle 32G X 4 MM MISC Use to inject lantus once dialy 10/29/17   Lorella Nimrod, MD  Lancets (ACCU-CHEK SOFT TOUCH) lancets Use as instructed 09/23/16   Rivet, Sindy Guadeloupe, MD  midodrine (PROAMATINE) 5 MG tablet Take 1 tablet (5 mg total) by mouth 3 (three) times daily with meals. 12/01/21   Eugenie Filler, MD  pantoprazole (PROTONIX) 40 MG tablet Take 1 tablet (40 mg total) by mouth daily. 11/07/21 12/07/21  Terrilee Croak, MD  propranolol (INDERAL) 20 MG tablet Take 1 tablet (20 mg total) by mouth 3 (three)  times daily. 12/01/21   Eugenie Filler, MD  traZODone (DESYREL) 50 MG tablet Take 1 tablet (50 mg total) by mouth at bedtime as needed for sleep. 11/01/21   Allie Bossier, MD  vitamin B-12 (CYANOCOBALAMIN) 1000 MCG tablet Take 1,000 mcg by mouth daily.    [provider]      Allergies    Amoxicillin, Bee venom, Penicillins, Pneumococcal vaccines, Sulfa antibiotics, and Bactrim [sulfamethoxazole-trimethoprim]    Review of Systems   Review of Systems  Constitutional:  Positive for activity change, appetite change and fatigue. Negative for chills and fever.  HENT:  Negative for congestion and trouble swallowing.   Respiratory:  Positive for shortness  of breath. Negative for cough.   Cardiovascular:  Positive for chest pain and palpitations. Negative for leg swelling.  Gastrointestinal:  Positive for abdominal pain, nausea and vomiting. Negative for abdominal distention and diarrhea.  Genitourinary:  Positive for decreased urine volume. Negative for difficulty urinating.  Musculoskeletal:  Negative for arthralgias.  Neurological:  Positive for dizziness and weakness.    Physical Exam Updated Vital Signs BP (!) 113/48   Pulse (!) 124   Temp (!) 97.4 F (36.3 C) (Oral)   Resp 17   Wt 70 kg   SpO2 97%   BMI 24.17 kg/m  Physical Exam Constitutional:      General: She is in acute distress.     Appearance: She is ill-appearing.  HENT:     Head: Normocephalic and atraumatic.     Mouth/Throat:     Mouth: Mucous membranes are dry.     Pharynx: Oropharynx is clear.  Eyes:     Extraocular Movements: Extraocular movements intact.     Conjunctiva/sclera: Conjunctivae normal.     Pupils: Pupils are equal, round, and reactive to light.  Cardiovascular:     Rate and Rhythm: Regular rhythm. Tachycardia present.     Pulses: Normal pulses.     Heart sounds: Normal heart sounds.  Pulmonary:     Effort: Respiratory distress present.     Breath sounds: Normal breath sounds. No wheezing.  Abdominal:     General: Abdomen is flat.     Palpations: Abdomen is soft.     Tenderness: There is abdominal tenderness. There is no guarding.  Musculoskeletal:        General: Normal range of motion.     Cervical back: Normal range of motion and neck supple.  Skin:    General: Skin is warm and dry.     Capillary Refill: Capillary refill takes less than 2 seconds.  Neurological:     Mental Status: She is alert.     ED Results / Procedures / Treatments   Labs (all labs ordered are listed, but only abnormal results are displayed) Labs Reviewed  LIPASE, BLOOD - Abnormal; Notable for the following components:      Result Value   Lipase 56 (*)     All other components within normal limits  COMPREHENSIVE METABOLIC PANEL - Abnormal; Notable for the following components:   CO2 14 (*)    Glucose, Bld 159 (*)    BUN 26 (*)    Creatinine, Ser 1.37 (*)    Total Protein 8.3 (*)    Total Bilirubin 1.7 (*)    GFR, Estimated 43 (*)    Anion gap 21 (*)    All other components within normal limits  CBC - Abnormal; Notable for the following components:   WBC 16.2 (*)  RBC 6.25 (*)    Hemoglobin 15.2 (*)    HCT 50.4 (*)    MCH 24.3 (*)    RDW 17.0 (*)    Platelets 464 (*)    All other components within normal limits  LACTIC ACID, PLASMA - Abnormal; Notable for the following components:   Lactic Acid, Venous 2.9 (*)    All other components within normal limits  CBG MONITORING, ED - Abnormal; Notable for the following components:   Glucose-Capillary 185 (*)    All other components within normal limits  CBG MONITORING, ED - Abnormal; Notable for the following components:   Glucose-Capillary 200 (*)    All other components within normal limits  CULTURE, BLOOD (ROUTINE X 2)  CULTURE, BLOOD (ROUTINE X 2)  URINALYSIS, ROUTINE W REFLEX MICROSCOPIC  LACTIC ACID, PLASMA  BETA-HYDROXYBUTYRIC ACID  PROTIME-INR  I-STAT VENOUS BLOOD GAS, ED  TROPONIN I (HIGH SENSITIVITY)  TROPONIN I (HIGH SENSITIVITY)    EKG EKG Interpretation  Date/Time:  Tuesday January 20 2022 06:59:04 EDT Ventricular Rate:  143 PR Interval:    QRS Duration: 80 QT Interval:  350 QTC Calculation: 540 R Axis:   78 Text Interpretation: Supraventricular tachycardia ST depression, consider subendocardial injury Abnormal ECG When compared with ECG of 26-Nov-2021 19:16, PREVIOUS ECG IS PRESENT Confirmed by Lacretia Leigh ([id number]) on 01/20/2022 7:45:22 AM  Radiology CT ABDOMEN PELVIS WO CONTRAST  Result Date: 01/20/2022 CLINICAL DATA:  Nausea vomiting for 3 days.  Abdominal pain. EXAM: CT ABDOMEN AND PELVIS WITHOUT CONTRAST TECHNIQUE: Multidetector CT imaging of the  abdomen and pelvis was performed following the standard protocol without IV contrast. RADIATION DOSE REDUCTION: This exam was performed according to the departmental dose-optimization program which includes automated exposure control, adjustment of the mA and/or kV according to patient size and/or use of iterative reconstruction technique. COMPARISON:  11/21/2021 FINDINGS: Lower chest: Unremarkable. Hepatobiliary: No suspicious focal abnormality in the liver on this study without intravenous contrast. Gallbladder surgically absent. No intrahepatic or extrahepatic biliary dilation. Pancreas: No focal mass lesion. No dilatation of the main duct. No intraparenchymal cyst. No peripancreatic edema. Spleen: No splenomegaly. No focal mass lesion. Adrenals/Urinary Tract: No adrenal nodule or mass. Several punctate nonobstructing stones are seen in each kidney. Small cysts are again noted in both kidneys, similar to prior. No followup recommended. No evidence for hydroureter. Bladder is decompressed. Stomach/Bowel: Stomach is unremarkable. No gastric wall thickening. No evidence of outlet obstruction. Duodenum is normally positioned as is the ligament of Treitz. No small bowel wall thickening. No small bowel dilatation. The terminal ileum is normal. Nonvisualization of the appendix is consistent with the reported history of appendectomy. No gross colonic mass. No colonic wall thickening. Vascular/Lymphatic: There is moderate atherosclerotic calcification of the abdominal aorta without aneurysm. There is no gastrohepatic or hepatoduodenal ligament lymphadenopathy. No retroperitoneal or mesenteric lymphadenopathy. No pelvic sidewall lymphadenopathy. Reproductive: The uterus is surgically absent. There is no adnexal mass. Other: No intraperitoneal free fluid. Musculoskeletal: Fixation hardware noted proximal left femur. No worrisome lytic or sclerotic osseous abnormality. IMPRESSION: 1. No acute findings in the abdomen or  pelvis. Specifically, no findings to explain the patient's history of nausea and vomiting. 2. Bilateral nonobstructing renal stones. 3. Aortic Atherosclerosis (ICD10-I70.0). Electronically Signed   By: Misty Stanley M.D.   On: 01/20/2022 08:26   DG Chest 2 View  Result Date: 01/20/2022 CLINICAL DATA:  Weakness, shortness of breath.  Suspected sepsis. EXAM: CHEST - 2 VIEW COMPARISON:  11/21/2021 FINDINGS: The heart size and  mediastinal contours are within normal limits. Both lungs are clear. The visualized skeletal structures are unremarkable. IMPRESSION: No active cardiopulmonary disease. Electronically Signed   By: Kerby Moors M.D.   On: 01/20/2022 07:51    Procedures Procedures   Medications Ordered in ED Medications  ondansetron (ZOFRAN) 4 MG/2ML injection (has no administration in time range)  metroNIDAZOLE (FLAGYL) IVPB 500 mg (has no administration in time range)  insulin regular, human (MYXREDLIN) 100 units/ 100 mL infusion (3.2 Units/hr Intravenous New Bag/Given 01/20/22 0906)  lactated ringers infusion (has no administration in time range)  dextrose 5 % in lactated ringers infusion (has no administration in time range)  dextrose 50 % solution 0-50 mL (has no administration in time range)  potassium chloride 10 mEq in 100 mL IVPB (10 mEq Intravenous New Bag/Given 01/20/22 0903)  vancomycin (VANCOREADY) IVPB 1500 mg/300 mL (has no administration in time range)  ceFEPIme (MAXIPIME) 2 g in sodium chloride 0.9 % 100 mL IVPB (has no administration in time range)  vancomycin (VANCOCIN) IVPB 1000 mg/200 mL premix (has no administration in time range)  ondansetron (ZOFRAN) injection 4 mg (4 mg Intravenous Given 01/20/22 0705)  ondansetron (ZOFRAN) injection 4 mg (4 mg Intravenous Given 01/20/22 0753)  fentaNYL (SUBLIMAZE) injection 50 mcg (50 mcg Intravenous Given 01/20/22 0754)  lactated ringers bolus 1,000 mL (1,000 mLs Intravenous New Bag/Given 01/20/22 0831)  LORazepam (ATIVAN) injection  0.5 mg (0.5 mg Intravenous Given 01/20/22 0810)  lactated ringers bolus 1,000 mL (1,000 mLs Intravenous New Bag/Given 01/20/22 0830)    ED Course/ Medical Decision Making/ A&P                           Medical Decision Making Amount and/or Complexity of Data Reviewed Labs: ordered. Radiology: ordered.  Risk Prescription drug management. Decision regarding hospitalization.   Baleigh Rennaker is a 66 y.o. female with history of T2DM, HLD, HTN, acute blood loss anemia presenting with chest and abdominal pain in the setting of persistent nausea and vomiting. Patient presents in acute distress and ill appearing. Differential includes abdominal perforation, significant anemia, atypical MI presentation, DKA, HHS.  Patient with significant abdominal pain with persistent vomiting, tachycardia, and tachypnea. CXR obtained in triage showed no abnormalities and no air under the diaphragm. A CT abdomen/pelvis showed no acute findings and non-obstructing stones.   Patient given Fentanyl 62mg for pain and Zofran 471mIV x2 for nausea, Ativan 0.62m29mor anxiety. Patient also appears volume down and significantly tachycardic and was given 2L LR.   Blood cultures were collected and Code Sepsis was initiated due to vitals and elevated lactic acid of 2.9. Patient was started on broad spectrum antibiotics, initially chose aztreonam due to documented PCN allergy with pharmacy consult, vancomycin, and metronidazole.  Labs significant for hemoconcentration on CBC, lactic acid 2.9, CBG 185, lipase 56, CMP with signs of DKA and elevated anion gap of 21. Patient was started EndoTool per DKA protocol.  Given significant medical conditions including sepsis and DKA, patient qualifies for admission. Patient's case was discussed with [facility].   Final Clinical Impression(s) / ED Diagnoses Final diagnoses:  Diabetic ketoacidosis without coma associated with type 2 diabetes mellitus ([place]CC[place]ublinSepsis with acute  renal failure without septic shock, due to unspecified organism, unspecified acute renal failure type ([place]   Edmar Blankenburg, DO 01/20/22 [id number] AllLacretia LeighD 01/21/22 0710

## 2022-01-20 NOTE — Progress Notes (Signed)
Pharmacy Antibiotic Note  Laura Gentry is a 66 y.o. female admitted on 01/20/2022 with sepsis.  Pharmacy has been consulted for aztreonam and vancomycin dosing. Patient is afebrile with WBC of 16.2. Patient has a slight AKI with Scr of 1.37, baseline is 0.90 however anticipate this to improve with fluids. Of note patient has an amoxicillin allergy, but has tolerated ceftriaxone on several occasions.   Plan: IV vancomycin 1x load of '1500mg'$  ('20mg'$ /kg), followed by '1000mg'$  Q24H eAUC:  478 Change aztronam to cefepime 2g Q12H.  Follow cultures for de-escalation.   Weight: 70 kg (154 lb 5.2 oz)  Temp (24hrs), Avg:97.4 F (36.3 C), Min:97.4 F (36.3 C), Max:97.4 F (36.3 C)  Recent Labs  Lab 01/20/22 0710  WBC 16.2*  CREATININE 1.37*  LATICACIDVEN 2.9*    Estimated Creatinine Clearance: 39.3 mL/min (A) (by C-G formula based on SCr of 1.37 mg/dL (H)).    Allergies  Allergen Reactions   Amoxicillin Anaphylaxis and Shortness Of Breath   Bee Venom Shortness Of Breath   Penicillins Anaphylaxis, Swelling and Other (See Comments)    Remote reaction- Throat swelling - compromised airway Tolerates ceftriaxone   Pneumococcal Vaccines Other (See Comments)    Per patient due to Brain Aneurysm   Sulfa Antibiotics Hives, Itching and Swelling   Bactrim [Sulfamethoxazole-Trimethoprim] Hives, Itching, Swelling and Other (See Comments)    Felt off balance    Thank you for allowing pharmacy to be a part of this patient's care.  Ventura Sellers 01/20/2022 9:02 AM

## 2022-01-20 NOTE — Assessment & Plan Note (Addendum)
Continue insulin therapy IV for now, then transition to sq when DKA resolves.  Continue with statin therapy.  Diabetic neuropathy continue with gabapentin.

## 2022-01-20 NOTE — Assessment & Plan Note (Signed)
Continue with iron supplementation.

## 2022-01-20 NOTE — ED Notes (Signed)
Lactic 2.9. MD made aware.

## 2022-01-21 ENCOUNTER — Other Ambulatory Visit: Payer: Self-pay

## 2022-01-21 ENCOUNTER — Encounter (HOSPITAL_COMMUNITY): Payer: Self-pay | Admitting: Internal Medicine

## 2022-01-21 DIAGNOSIS — E1110 Type 2 diabetes mellitus with ketoacidosis without coma: Secondary | ICD-10-CM | POA: Diagnosis not present

## 2022-01-21 LAB — CBC
HCT: 37.3 % (ref 36.0–46.0)
Hemoglobin: 12.1 g/dL (ref 12.0–15.0)
MCH: 24.8 pg — ABNORMAL LOW (ref 26.0–34.0)
MCHC: 32.4 g/dL (ref 30.0–36.0)
MCV: 76.4 fL — ABNORMAL LOW (ref 80.0–100.0)
Platelets: 328 10*3/uL (ref 150–400)
RBC: 4.88 MIL/uL (ref 3.87–5.11)
RDW: 15.9 % — ABNORMAL HIGH (ref 11.5–15.5)
WBC: 12.5 10*3/uL — ABNORMAL HIGH (ref 4.0–10.5)
nRBC: 0 % (ref 0.0–0.2)

## 2022-01-21 LAB — BASIC METABOLIC PANEL
Anion gap: 8 (ref 5–15)
BUN: 14 mg/dL (ref 8–23)
CO2: 22 mmol/L (ref 22–32)
Calcium: 8.6 mg/dL — ABNORMAL LOW (ref 8.9–10.3)
Chloride: 107 mmol/L (ref 98–111)
Creatinine, Ser: 0.94 mg/dL (ref 0.44–1.00)
GFR, Estimated: >60 mL/min (ref >60)
Glucose, Bld: 165 mg/dL — ABNORMAL HIGH (ref 70–99)
Potassium: 3.4 mmol/L — ABNORMAL LOW (ref 3.5–5.1)
Sodium: 137 mmol/L (ref 135–145)

## 2022-01-21 LAB — GLUCOSE, CAPILLARY
Glucose-Capillary: 136 mg/dL — ABNORMAL HIGH (ref 70–99)
Glucose-Capillary: 149 mg/dL — ABNORMAL HIGH (ref 70–99)
Glucose-Capillary: 134 mg/dL — ABNORMAL HIGH (ref 70–99)
Glucose-Capillary: 149 mg/dL — ABNORMAL HIGH (ref 70–99)
Glucose-Capillary: 145 mg/dL — ABNORMAL HIGH (ref 70–99)
Glucose-Capillary: 151 mg/dL — ABNORMAL HIGH (ref 70–99)
Glucose-Capillary: 225 mg/dL — ABNORMAL HIGH (ref 70–99)
Glucose-Capillary: 217 mg/dL — ABNORMAL HIGH (ref 70–99)

## 2022-01-21 LAB — BETA-HYDROXYBUTYRIC ACID: Beta-Hydroxybutyric Acid: 0.56 mmol/L — ABNORMAL HIGH (ref 0.05–0.27)

## 2022-01-21 LAB — CBG MONITORING, ED
Glucose-Capillary: 150 mg/dL — ABNORMAL HIGH (ref 70–99)
Glucose-Capillary: 161 mg/dL — ABNORMAL HIGH (ref 70–99)

## 2022-01-21 LAB — MAGNESIUM: Magnesium: 1.4 mg/dL — ABNORMAL LOW (ref 1.7–2.4)

## 2022-01-21 MED ORDER — INSULIN ASPART 100 UNIT/ML IJ SOLN
0.0000 [IU] | Freq: Three times a day (TID) | INTRAMUSCULAR | Status: DC
  Administered 2022-01-21: 5 [IU] via SUBCUTANEOUS
  Administered 2022-01-22: 2 [IU] via SUBCUTANEOUS

## 2022-01-21 MED ORDER — LACTATED RINGERS IV SOLN: INTRAVENOUS | Status: DC

## 2022-01-21 MED ORDER — INSULIN ASPART 100 UNIT/ML IJ SOLN
0.0000 [IU] | Freq: Every day | INTRAMUSCULAR | Status: DC
  Administered 2022-01-21: 2 [IU] via SUBCUTANEOUS

## 2022-01-21 MED ORDER — INSULIN GLARGINE-YFGN 100 UNIT/ML ~~LOC~~ SOLN
25.0000 [IU] | Freq: Every day | SUBCUTANEOUS | Status: DC
  Administered 2022-01-21 – 2022-01-22 (×2): 25 [IU] via SUBCUTANEOUS
  Filled 2022-01-21 (×2): qty 0.25

## 2022-01-21 MED ORDER — MAGNESIUM SULFATE 4 GM/100ML IV SOLN
4.0000 g | Freq: Once | INTRAVENOUS | Status: AC
  Administered 2022-01-21: 4 g via INTRAVENOUS
  Filled 2022-01-21: qty 100

## 2022-01-21 MED ORDER — POTASSIUM CHLORIDE CRYS ER 20 MEQ PO TBCR
40.0000 meq | EXTENDED_RELEASE_TABLET | Freq: Once | ORAL | Status: AC
  Administered 2022-01-21: 40 meq via ORAL
  Filled 2022-01-21: qty 2

## 2022-01-21 MED ORDER — FERROUS SULFATE 325 (65 FE) MG PO TABS
325.0000 mg | ORAL_TABLET | Freq: Every day | ORAL | Status: DC
  Administered 2022-01-21 – 2022-01-22 (×2): 325 mg via ORAL
  Filled 2022-01-21 (×2): qty 1

## 2022-01-21 MED ORDER — THIAMINE MONONITRATE 100 MG PO TABS
100.0000 mg | ORAL_TABLET | Freq: Every day | ORAL | Status: DC
  Administered 2022-01-21 – 2022-01-22 (×2): 100 mg via ORAL
  Filled 2022-01-21 (×2): qty 1

## 2022-01-21 NOTE — Plan of Care (Signed)

## 2022-01-21 NOTE — Evaluation (Signed)
Physical Therapy Evaluation Patient Details Name: Alitzel Cookson MRN: [id number] DOB: 24-Nov-1955 Today's Date: 01/21/2022  History of Present Illness  Pt is 66 yo female admitted 01/20/22 presented with abdominal pain with dizziness and weakness.  Admitted with DKA and acute gastritis. Pt with admission in June for L hip fx with pinning and in July for anemia and orthostatic hypotension.  Pt with hx including DM2, dyslipidemia, renal stones, osteoporosis, brain aneurysm.  Clinical Impression  Pt admitted with above diagnosis. At baseline, pt is independent.  She had recent hip fx but had returned to ambulation without AD and completed HH.  Pt reports recent falls were due to medication/anemia/hypotension.  Today , pt was supervision with transfers and min guard to ambulate without AD with mild unsteadiness.  Will benefit from PT for further mobility/balance training to improve safety while inpatient. All VSS during session. Pt currently with functional limitations due to the deficits listed below (see PT Problem List). Pt will benefit from skilled PT to increase their independence and safety with mobility to allow discharge to the venue listed below.          Recommendations for follow up therapy are one component of a multi-disciplinary discharge planning process, led by the attending physician.  Recommendations may be updated based on patient status, additional functional criteria and insurance authorization.  Follow Up Recommendations No PT follow up      Assistance Recommended at Discharge PRN  Patient can return home with the following       Equipment Recommendations None recommended by PT  Recommendations for Other Services       Functional Status Assessment Patient has had a recent decline in their functional status and demonstrates the ability to make significant improvements in function in a reasonable and predictable amount of time.     Precautions / Restrictions  Precautions Precautions: Fall Precaution Comments: orthostatic hypotension      Mobility  Bed Mobility Overal bed mobility: Independent                  Transfers Overall transfer level: Needs assistance Equipment used: None Transfers: Sit to/from Stand Sit to Stand: Supervision                Ambulation/Gait Ambulation/Gait assistance: Min guard Gait Distance (Feet): 300 Feet Assistive device: None Gait Pattern/deviations: Narrow base of support, Scissoring       General Gait Details: Pt with narrow BOS and mild toe in bilaterally with occasional scissoring.  Additionally, pt with vaulting on L during R swing.  Mild unsteadiness requiring min guard (particularly when distracted)  Stairs            Wheelchair Mobility    Modified Rankin (Stroke Patients Only)       Balance Overall balance assessment: Needs assistance Sitting-balance support: No upper extremity supported, Feet supported Sitting balance-Leahy Scale: Good     Standing balance support: No upper extremity supported Standing balance-Leahy Scale: Fair                               Pertinent Vitals/Pain Pain Assessment Pain Assessment: No/denies pain    Home Living Family/patient expects to be discharged to:: Private residence Living Arrangements: Other relatives (sister) Available Help at Discharge: Family;Available PRN/intermittently Type of Home: House Home Access: Stairs to enter Entrance Stairs-Rails: Left;Right;Can reach both Entrance Stairs-Number of Steps: 7 Alternate Level Stairs-Number of Steps: flight Home Layout: Multi-level;1/2 bath  on main level;Bed/bath upstairs Home Equipment: Conservation officer, nature (2 wheels);Hand held shower head;Grab bars - tub/shower;Tub bench;Cane - single point Additional Comments: Reports just finished HHPT/OT    Prior Function Prior Level of Function : Independent/Modified Independent;Driving             Mobility Comments:  Could ambulate without AD; could ambulate in community; recent hip fx in June ADLs Comments: independent     Hand Dominance   Dominant Hand: Right    Extremity/Trunk Assessment   Upper Extremity Assessment Upper Extremity Assessment: Defer to OT evaluation    Lower Extremity Assessment Lower Extremity Assessment: LLE deficits/detail;RLE deficits/detail RLE Deficits / Details: ROM WFL; MMT 5/5 LLE Deficits / Details: ROM WFL; MMT 5/5 ankle, 4+/5 hip and knee    Cervical / Trunk Assessment Cervical / Trunk Assessment: Normal  Communication   Communication: No difficulties  Cognition Arousal/Alertness: Awake/alert Behavior During Therapy: WFL for tasks assessed/performed Overall Cognitive Status: Within Functional Limits for tasks assessed                                          General Comments General comments (skin integrity, edema, etc.): VSS during therapy    Exercises     Assessment/Plan    PT Assessment Patient needs continued PT services  PT Problem List Decreased strength;Decreased mobility;Decreased balance;Decreased activity tolerance       PT Treatment Interventions DME instruction;Therapeutic exercise;Gait training;Stair training;Functional mobility training;Therapeutic activities;Patient/family education;Balance training;Neuromuscular re-education    PT Goals (Current goals can be found in the Care Plan section)  Acute Rehab PT Goals Patient Stated Goal: to go home PT Goal Formulation: With patient Time For Goal Achievement: 02/04/22    Frequency Min 3X/week     Co-evaluation               AM-PAC PT "6 Clicks" Mobility  Outcome Measure Help needed turning from your back to your side while in a flat bed without using bedrails?: None Help needed moving from lying on your back to sitting on the side of a flat bed without using bedrails?: None Help needed moving to and from a bed to a chair (including a wheelchair)?:  None Help needed standing up from a chair using your arms (e.g., wheelchair or bedside chair)?: A Little Help needed to walk in hospital room?: A Little Help needed climbing 3-5 steps with a railing? : A Little 6 Click Score: 21    End of Session Equipment Utilized During Treatment: Gait belt Activity Tolerance: Patient tolerated treatment well Patient left: with call bell/phone within reach;in bed;with bed alarm set Nurse Communication: Mobility status PT Visit Diagnosis: Other abnormalities of gait and mobility (R26.89);Unsteadiness on feet (R26.81);History of falling (Z91.81)    Time: 2831-5176 PT Time Calculation (min) (ACUTE ONLY): 24 min   Charges:   PT Evaluation $PT Eval Low Complexity: 1 Low PT Treatments $Gait Training: 8-22 mins        Abran Richard, PT Acute Rehab [facility] [place] 01/21/2022, 1:37 PM

## 2022-01-21 NOTE — Evaluation (Signed)
Occupational Therapy Evaluation Patient Details Name: Laura Gentry MRN: [id number] DOB: [birth year] Today's Date: 01/21/2022   History of Present Illness Pt is 66 yo female admitted 01/20/22 presented with abdominal pain with dizziness and weakness.  Admitted with DKA and acute gastritis. Pt with admission in June for L hip fx with pinning and in July for anemia and orthostatic hypotension.  Pt with hx including DM2, dyslipidemia, renal stones, osteoporosis, brain aneurysm.   Clinical Impression   Pt reports feeling much better. Eager to try to eat. Pt overall requires set up to min guard assist for ADLs and mobility without AD. Likely to progress well and not require post acute OT.      Recommendations for follow up therapy are one component of a multi-disciplinary discharge planning process, led by the attending physician.  Recommendations may be updated based on patient status, additional functional criteria and insurance authorization.   Follow Up Recommendations  No OT follow up    Assistance Recommended at Discharge Intermittent Supervision/Assistance  Patient can return home with the following A little help with walking and/or transfers;A little help with bathing/dressing/bathroom;Assistance with cooking/housework;Assist for transportation;Help with stairs or ramp for entrance    Functional Status Assessment  Patient has had a recent decline in their functional status and demonstrates the ability to make significant improvements in function in a reasonable and predictable amount of time.  Equipment Recommendations  None recommended by OT    Recommendations for Other Services       Precautions / Restrictions Precautions Precautions: Fall Precaution Comments: h/o orthostatic hypotension      Mobility Bed Mobility Overal bed mobility: Independent                  Transfers Overall transfer level: Needs assistance Equipment used: None Transfers: Sit to/from  Stand Sit to Stand: Supervision                  Balance Overall balance assessment: Needs assistance   Sitting balance-Leahy Scale: Good     Standing balance support: No upper extremity supported Standing balance-Leahy Scale: Fair                             ADL either performed or assessed with clinical judgement   ADL Overall ADL's : Needs assistance/impaired Eating/Feeding: Independent;Bed level   Grooming: Supervision/safety;Standing   Upper Body Bathing: Set up;Sitting   Lower Body Bathing: Supervison/ safety;Sit to/from stand   Upper Body Dressing : Set up;Sitting   Lower Body Dressing: Supervision/safety;Sit to/from stand   Toilet Transfer: Min guard;Ambulation   Toileting- Clothing Manipulation and Hygiene: Supervision/safety;Sit to/from stand       Functional mobility during ADLs: Min guard       Vision Ability to See in Adequate Light: 0 Adequate Patient Visual Report: No change from baseline       Perception     Praxis      Pertinent Vitals/Pain Pain Assessment Pain Assessment: No/denies pain     Hand Dominance Right   Extremity/Trunk Assessment Upper Extremity Assessment Upper Extremity Assessment: Overall WFL for tasks assessed   Lower Extremity Assessment Lower Extremity Assessment: Defer to PT evaluation RLE Deficits / Details: ROM WFL; MMT 5/5 LLE Deficits / Details: ROM WFL; MMT 5/5 ankle, 4+/5 hip and knee   Cervical / Trunk Assessment Cervical / Trunk Assessment: Normal   Communication Communication Communication: No difficulties   Cognition Arousal/Alertness: Awake/alert Behavior During Therapy:  WFL for tasks assessed/performed Overall Cognitive Status: Within Functional Limits for tasks assessed                                       General Comments  VSS during therapy    Exercises     Shoulder Instructions      Home Living Family/patient expects to be discharged to:: Private  residence Living Arrangements: Other relatives (sister) Available Help at Discharge: Family;Available PRN/intermittently Type of Home: House Home Access: Stairs to enter [employer] of Steps: 7 Entrance Stairs-Rails: Left;Right;Can reach both Home Layout: Multi-level;1/2 bath on main level;Bed/bath upstairs Alternate Level Stairs-Number of Steps: flight   Bathroom Shower/Tub: Teacher, early years/pre: Standard     Home Equipment: Conservation officer, nature (2 wheels);Hand held shower head;Grab bars - tub/shower;Tub bench;Cane - single point;Shower seat   Additional Comments: Reports just finished HHPT/OT      Prior Functioning/Environment Prior Level of Function : Independent/Modified Independent;Driving             Mobility Comments: Could ambulate without AD; could ambulate in community; recent hip fx in June ADLs Comments: independent, standing to shower        OT Problem List: Impaired balance (sitting and/or standing);Decreased strength;Decreased activity tolerance      OT Treatment/Interventions: Self-care/ADL training;DME and/or AE instruction;Patient/family education;Balance training;Therapeutic activities    OT Goals(Current goals can be found in the care plan section) Acute Rehab OT Goals OT Goal Formulation: With patient Time For Goal Achievement: 02/04/22 Potential to Achieve Goals: Good ADL Goals Pt Will Perform Grooming: with modified independence;standing Pt Will Transfer to Toilet: with modified independence;ambulating;regular height toilet Pt Will Perform Toileting - Clothing Manipulation and hygiene: with modified independence;sit to/from stand Pt Will Perform Tub/Shower Transfer: with modified independence;ambulating;Tub transfer  OT Frequency: Min 2X/week    Co-evaluation              AM-PAC OT "6 Clicks" Daily Activity     Outcome Measure Help from another person eating meals?: None Help from another person taking care of  personal grooming?: A Little Help from another person toileting, which includes using toliet, bedpan, or urinal?: A Little Help from another person bathing (including washing, rinsing, drying)?: A Little Help from another person to put on and taking off regular upper body clothing?: None Help from another person to put on and taking off regular lower body clothing?: A Little 6 Click Score: 20   End of Session    Activity Tolerance: Patient tolerated treatment well Patient left: in bed;with call bell/phone within reach  OT Visit Diagnosis: Unsteadiness on feet (R26.81);Other abnormalities of gait and mobility (R26.89);Muscle weakness (generalized) (M62.81)                Time: 3212-2482 OT Time Calculation (min): 17 min Charges:  OT General Charges $OT Visit: 1 Visit OT Evaluation $OT Eval Low Complexity: [place], OTR/L Acute Rehabilitation Services Office: [phone]  Malka So 01/21/2022, 3:19 PM

## 2022-01-21 NOTE — Progress Notes (Addendum)
Transition of Care Department [facility]) following patient for high risk of readmission.   Patient has medical history significant of T2DM,  dyslipidemia and hypotension/ hypertension, renal stones who presented with abdominal pain, nausea and vomiting, in the ER she was diagnosed with DKA dehydration and admitted to the hospital.  Transition of Care Department [facility]) has reviewed patient and we will continue to monitor patient advancement through interdisciplinary progression rounds.

## 2022-01-21 NOTE — Progress Notes (Signed)
PROGRESS NOTE                                                                                                                                                                                                             Patient Demographics:    Laura Gentry, is a 66 y.o. female, DOB - Nov 18, 1955, GNF:[id number]  Outpatient Primary MD for the patient is Seward Carol, MD    LOS - 1  Admit date - 01/20/2022    No chief complaint on file.      Brief Narrative (HPI from H&P)   66 y.o. female with medical history significant of T2DM,  dyslipidemia and hypotension/ hypertension, renal stones who presented with abdominal pain, nausea and vomiting, in the ER she was diagnosed with DKA dehydration and admitted to the hospital.   Subjective:    Laura Gentry today has, No headache, No chest pain, No abdominal pain - No Nausea, No new weakness tingling or numbness, no SOB.   Assessment  & Plan :    DKA in a patient with DM type II.  Much improved, gap has closed, this was due to missing a few insulin dosages by the patient, counseled on compliance, transition to Lantus along with sliding scale and monitor.  Outpatient glycemic control as suggested by high A1c.  Lab Results  Component Value Date   HGBA1C 8.8 (H) 10/29/2021   CBG (last 3)  Recent Labs    01/21/22 0815 01/21/22 1032 01/21/22 1125  GLUCAP 149* 145* 151*     Gastritis - Acute gastritis, CT abdomen pelvis nonacute improved with PPI continue.  Leukocytosis - No clinical signs of infection or sepsis.  Reactionary, much improved.  Hypotension - Patient with orthostatic hypotension, dehydrated, continue IV fluids.  Chronic iron deficiency anemia - Continue with iron supplementation.   Diabetic peripheral neuropathy.  Continue Neurontin.  Depression - Continue with duloxetine.   Hypokalemia and hypomagnesemia.  Both replaced.         Condition -  Fair  Family Communication  :  None present  Code Status :  Full  Consults  :  None  PUD Prophylaxis : PPI   Procedures  :     CT - 1. No acute findings in the abdomen or pelvis. Specifically, no findings to explain the patient's history of nausea and vomiting. 2.  Bilateral nonobstructing renal stones. 3. Aortic Atherosclerosis      Disposition Plan  :    Status is: Inpatient  DVT Prophylaxis  :    enoxaparin (LOVENOX) injection 40 mg Start: 01/20/22 2200 SCDs Start: 01/20/22 1350 SCDs Start: 01/20/22 1350   Lab Results  Component Value Date   PLT 328 01/21/2022    Diet :  Diet Order             Diet Carb Modified Fluid consistency: Thin; Room service appropriate? Yes  Diet effective now                    Inpatient Medications  Scheduled Meds:  atorvastatin  40 mg Oral Daily   cyanocobalamin  1,000 mcg Oral Daily   DULoxetine  60 mg Oral Daily   enoxaparin (LOVENOX) injection  40 mg Subcutaneous Q24H   ferrous sulfate  325 mg Oral Q breakfast   gabapentin  300 mg Oral BID   gabapentin  600 mg Oral QHS   insulin aspart  0-15 Units Subcutaneous TID WC   insulin aspart  0-5 Units Subcutaneous QHS   insulin glargine-yfgn  25 Units Subcutaneous Daily   midodrine  5 mg Oral TID WC   pantoprazole (PROTONIX) IV  40 mg Intravenous Q12H   propranolol  20 mg Oral TID   sucralfate  1 g Oral TID WC & HS   thiamine  100 mg Oral Daily   Continuous Infusions:  lactated ringers     magnesium sulfate bolus IVPB     PRN Meds:.acetaminophen **OR** acetaminophen, dextrose, ondansetron **OR** ondansetron (ZOFRAN) IV, traZODone  Time Spent in minutes  30   Laura Gentry M.D on 01/21/2022 at 11:27 AM  To page go to [email or web address]   [facility] -  Office  [phone]  See all Orders from today for further details    Objective:   Vitals:   01/21/22 0230 01/21/22 0321 01/21/22 0400 01/21/22 0800  BP: (!) 150/83 131/77 136/85 126/86  Pulse: (!) 107  (!) 102 100 100  Resp: '19 16 15 16  '$ Temp:  98.8 F (37.1 C) 98.2 F (36.8 C) 98.1 F (36.7 C)  TempSrc:  Oral Oral   SpO2: 93%   93%  Weight:  63.1 kg    Height:  '5\' 7"'$  (1.702 m)      Wt Readings from Last 3 Encounters:  01/21/22 63.1 kg  11/26/21 70.3 kg  11/21/21 71 kg     Intake/Output Summary (Last 24 hours) at 01/21/2022 1127 Last data filed at 01/21/2022 0450 Gross per 24 hour  Intake 2098.37 ml  Output --  Net 2098.37 ml     Physical Exam  Awake Alert, No new F.N deficits, Normal affect [place].AT,PERRAL Supple Neck, No JVD,   Symmetrical Chest wall movement, Good air movement bilaterally, CTAB RRR,No Gallops,Rubs or new Murmurs,  +ve B.Sounds, Abd Soft, No tenderness,   No Cyanosis, Clubbing or edema      Data Review:    CBC Recent Labs  Lab 01/20/22 0710 01/20/22 1651 01/21/22 0301  WBC 16.2* 10.1 12.5*  HGB 15.2* 11.7* 12.1  HCT 50.4* 35.8* 37.3  PLT 464* 312 328  MCV 80.6 75.7* 76.4*  MCH 24.3* 24.7* 24.8*  MCHC 30.2 32.7 32.4  RDW 17.0* 15.4 15.9*    Electrolytes Recent Labs  Lab 01/20/22 0710 01/20/22 0940 01/20/22 1050 01/20/22 1724 01/21/22 0251  NA 136 137  --  136 137  K 3.5 3.6  --  3.3* 3.4*  CL 101 105  --  106 107  CO2 14* 16*  --  21* 22  GLUCOSE 159* 159*  --  144* 165*  BUN 26* 24*  --  18 14  CREATININE 1.37* 1.42*  --  0.90 0.94  CALCIUM 9.6 8.7*  --  8.8* 8.6*  AST 21  --   --   --   --   ALT 16  --   --   --   --   ALKPHOS 97  --   --   --   --   BILITOT 1.7*  --   --   --   --   ALBUMIN 4.1  --   --   --   --   MG  --   --   --   --  1.4*  LATICACIDVEN 2.9*  --  1.3  --   --     ------------------------------------------------------------------------------------------------------------------ No results for input(s): "CHOL", "HDL", "LDLCALC", "TRIG", "CHOLHDL", "LDLDIRECT" in the last 72 hours.  Lab Results  Component Value Date   HGBA1C 8.8 (H) 10/29/2021    Radiology Reports CT ABDOMEN PELVIS WO  CONTRAST  Result Date: 01/20/2022 CLINICAL DATA:  Nausea vomiting for 3 days.  Abdominal pain. EXAM: CT ABDOMEN AND PELVIS WITHOUT CONTRAST TECHNIQUE: Multidetector CT imaging of the abdomen and pelvis was performed following the standard protocol without IV contrast. RADIATION DOSE REDUCTION: This exam was performed according to the departmental dose-optimization program which includes automated exposure control, adjustment of the mA and/or kV according to patient size and/or use of iterative reconstruction technique. COMPARISON:  11/21/2021 FINDINGS: Lower chest: Unremarkable. Hepatobiliary: No suspicious focal abnormality in the liver on this study without intravenous contrast. Gallbladder surgically absent. No intrahepatic or extrahepatic biliary dilation. Pancreas: No focal mass lesion. No dilatation of the main duct. No intraparenchymal cyst. No peripancreatic edema. Spleen: No splenomegaly. No focal mass lesion. Adrenals/Urinary Tract: No adrenal nodule or mass. Several punctate nonobstructing stones are seen in each kidney. Small cysts are again noted in both kidneys, similar to prior. No followup recommended. No evidence for hydroureter. Bladder is decompressed. Stomach/Bowel: Stomach is unremarkable. No gastric wall thickening. No evidence of outlet obstruction. Duodenum is normally positioned as is the ligament of Treitz. No small bowel wall thickening. No small bowel dilatation. The terminal ileum is normal. Nonvisualization of the appendix is consistent with the reported history of appendectomy. No gross colonic mass. No colonic wall thickening. Vascular/Lymphatic: There is moderate atherosclerotic calcification of the abdominal aorta without aneurysm. There is no gastrohepatic or hepatoduodenal ligament lymphadenopathy. No retroperitoneal or mesenteric lymphadenopathy. No pelvic sidewall lymphadenopathy. Reproductive: The uterus is surgically absent. There is no adnexal mass. Other: No  intraperitoneal free fluid. Musculoskeletal: Fixation hardware noted proximal left femur. No worrisome lytic or sclerotic osseous abnormality. IMPRESSION: 1. No acute findings in the abdomen or pelvis. Specifically, no findings to explain the patient's history of nausea and vomiting. 2. Bilateral nonobstructing renal stones. 3. Aortic Atherosclerosis (ICD10-I70.0). Electronically Signed   By: Misty Stanley M.D.   On: 01/20/2022 08:26   DG Chest 2 View  Result Date: 01/20/2022 CLINICAL DATA:  Weakness, shortness of breath.  Suspected sepsis. EXAM: CHEST - 2 VIEW COMPARISON:  11/21/2021 FINDINGS: The heart size and mediastinal contours are within normal limits. Both lungs are clear. The visualized skeletal structures are unremarkable. IMPRESSION: No active cardiopulmonary disease. Electronically Signed   By: Kerby Moors M.D.   On: 01/20/2022 07:51

## 2022-01-22 ENCOUNTER — Other Ambulatory Visit (HOSPITAL_COMMUNITY): Payer: Self-pay

## 2022-01-22 DIAGNOSIS — E1169 Type 2 diabetes mellitus with other specified complication: Secondary | ICD-10-CM | POA: Diagnosis not present

## 2022-01-22 DIAGNOSIS — K2900 Acute gastritis without bleeding: Secondary | ICD-10-CM | POA: Diagnosis not present

## 2022-01-22 DIAGNOSIS — E1110 Type 2 diabetes mellitus with ketoacidosis without coma: Secondary | ICD-10-CM | POA: Diagnosis not present

## 2022-01-22 DIAGNOSIS — I959 Hypotension, unspecified: Secondary | ICD-10-CM | POA: Diagnosis not present

## 2022-01-22 LAB — CBC WITH DIFFERENTIAL/PLATELET
Abs Immature Granulocytes: 0.04 10*3/uL (ref 0.00–0.07)
Basophils Absolute: 0.1 10*3/uL (ref 0.0–0.1)
Basophils Relative: 1 %
Eosinophils Absolute: 0.3 10*3/uL (ref 0.0–0.5)
Eosinophils Relative: 2 %
HCT: 37.3 % (ref 36.0–46.0)
Hemoglobin: 12 g/dL (ref 12.0–15.0)
Immature Granulocytes: 0 %
Lymphocytes Relative: 21 %
Lymphs Abs: 2.7 10*3/uL (ref 0.7–4.0)
MCH: 24.7 pg — ABNORMAL LOW (ref 26.0–34.0)
MCHC: 32.2 g/dL (ref 30.0–36.0)
MCV: 76.9 fL — ABNORMAL LOW (ref 80.0–100.0)
Monocytes Absolute: 0.8 10*3/uL (ref 0.1–1.0)
Monocytes Relative: 6 %
Neutro Abs: 8.8 10*3/uL — ABNORMAL HIGH (ref 1.7–7.7)
Neutrophils Relative %: 70 %
Platelets: 304 10*3/uL (ref 150–400)
RBC: 4.85 MIL/uL (ref 3.87–5.11)
RDW: 15.8 % — ABNORMAL HIGH (ref 11.5–15.5)
WBC: 12.7 10*3/uL — ABNORMAL HIGH (ref 4.0–10.5)
nRBC: 0 % (ref 0.0–0.2)

## 2022-01-22 LAB — MAGNESIUM: Magnesium: 1.8 mg/dL (ref 1.7–2.4)

## 2022-01-22 LAB — BRAIN NATRIURETIC PEPTIDE: B Natriuretic Peptide: 120.8 pg/mL — ABNORMAL HIGH (ref 0.0–100.0)

## 2022-01-22 LAB — BASIC METABOLIC PANEL
Anion gap: 10 (ref 5–15)
BUN: 9 mg/dL (ref 8–23)
CO2: 22 mmol/L (ref 22–32)
Calcium: 8.6 mg/dL — ABNORMAL LOW (ref 8.9–10.3)
Chloride: 103 mmol/L (ref 98–111)
Creatinine, Ser: 0.91 mg/dL (ref 0.44–1.00)
GFR, Estimated: >60 mL/min (ref >60)
Glucose, Bld: 140 mg/dL — ABNORMAL HIGH (ref 70–99)
Potassium: 3.4 mmol/L — ABNORMAL LOW (ref 3.5–5.1)
Sodium: 135 mmol/L (ref 135–145)

## 2022-01-22 LAB — GLUCOSE, CAPILLARY
Glucose-Capillary: 141 mg/dL — ABNORMAL HIGH (ref 70–99)
Glucose-Capillary: 217 mg/dL — ABNORMAL HIGH (ref 70–99)

## 2022-01-22 MED ORDER — ONDANSETRON HCL 4 MG PO TABS
4.0000 mg | ORAL_TABLET | Freq: Three times a day (TID) | ORAL | 0 refills | Status: DC | PRN
  Filled 2022-01-22: qty 20, 7d supply, fill #0

## 2022-01-22 MED ORDER — POTASSIUM CHLORIDE CRYS ER 20 MEQ PO TBCR
40.0000 meq | EXTENDED_RELEASE_TABLET | Freq: Once | ORAL | Status: AC
  Administered 2022-01-22: 40 meq via ORAL
  Filled 2022-01-22: qty 2

## 2022-01-22 MED ORDER — PANTOPRAZOLE SODIUM 40 MG PO TBEC
40.0000 mg | DELAYED_RELEASE_TABLET | Freq: Two times a day (BID) | ORAL | 0 refills | Status: DC
  Filled 2022-01-22: qty 60, 30d supply, fill #0

## 2022-01-22 MED ORDER — MAGNESIUM SULFATE IN D5W 1-5 GM/100ML-% IV SOLN
1.0000 g | Freq: Once | INTRAVENOUS | Status: AC
Start: 2022-01-22 — End: 2022-01-22
  Administered 2022-01-22: 1 g via INTRAVENOUS
  Filled 2022-01-22: qty 100

## 2022-01-22 MED ORDER — SUCRALFATE 1 GM/10ML PO SUSP
1.0000 g | Freq: Three times a day (TID) | ORAL | 0 refills | Status: DC
  Filled 2022-01-22: qty 420, 11d supply, fill #0

## 2022-01-22 NOTE — Discharge Summary (Signed)
Laura Gentry JJO:[id number] DOB: 07-Mar-1956 DOA: 01/20/2022  PCP: Seward Carol, MD  Admit date: 01/20/2022  Discharge date: 01/22/2022  Admitted From: Home   Disposition:  Home   Recommendations for Outpatient Follow-up:   Follow up with PCP in 1-2 weeks  PCP Please obtain BMP/CBC, 2 view CXR in 1week,  (see Discharge instructions)   PCP Please follow up on the following pending results: Needs close outpatient GI follow-up for EGD, to repeat BMP, CBC and magnesium level in 7 to 10 days.   Home Health: None   Equipment/Devices: None  Consultations: None  Discharge Condition: Stable    CODE STATUS: Full    Diet Recommendation: Heart Healthy Low Carb  CC - N&V  Brief history of present illness from the day of admission and additional interim summary    66 y.o. female with medical history significant of T2DM,  dyslipidemia and hypotension/ hypertension, renal stones who presented with abdominal pain, nausea and vomiting, in the ER she was diagnosed with DKA dehydration and admitted to the hospital.                                                                 Hospital Course   DKA in a patient with DM type II.  Much improved, gap has closed, this was due to missing a few insulin doses by the patient, counseled on compliance, transitioned to Lantus along with sliding scale and monitor. Poor Outpatient glycemic control as suggested by high A1c.  Requested to continue home regimen check CBGs q. ACH S and show the results to PCP within a week for further adjustment if needed.  Gastritis - Acute gastritis, CT abdomen pelvis nonacute improved with PPI and Carafate, symptom-free will be discharged with outpatient GI follow-up for EGD in the next 1 to 2 weeks.  PCP to kindly arrange.   Leukocytosis - No clinical  signs of infection or sepsis.  Reactionary, much improved.   Hypotension - resolved after IV fluids resume home regimen.   Chronic iron deficiency anemia - Continue with iron supplementation.    Diabetic peripheral neuropathy.  Continue Neurontin.   Depression - Continue with duloxetine.    Hypokalemia and hypomagnesemia.  Both replaced.  PCP to recheck in 7 to 10 days.   Discharge diagnosis     Principal Problem:   DKA (diabetic ketoacidosis) ([place]) Active Problems:   Gastritis   Leukocytosis   Hypotension   Iron deficiency anemia   Type 2 diabetes mellitus with hyperlipidemia [facility])   Depression    Discharge instructions    Discharge Instructions     Discharge instructions   Complete by: As directed    Follow with Primary MD Seward Carol, MD in 7 days   Get CBC, CMP, magnesium-  checked next visit within 1  week by Primary MD  Activity: As tolerated with Full fall precautions use walker/cane & assistance as needed  Disposition Home   Diet: Heart Healthy low carbohydrate.  Monitor CBGs q. ACH S.  Accuchecks 4 times/day, Once in AM empty stomach and then before each meal. Log in all results and show them to your Prim.MD in 3 days. If any glucose reading is under 80 or above 300 call your Prim MD immidiately. Follow Low glucose instructions for glucose under 80 as instructed.  Special Instructions: If you have smoked or chewed Tobacco  in the last 2 yrs please stop smoking, stop any regular Alcohol  and or any Recreational drug use.  On your next visit with your primary care physician please Get Medicines reviewed and adjusted.  Please request your Prim.MD to go over all Hospital Tests and Procedure/Radiological results at the follow up, please get all Hospital records sent to your Prim MD by signing hospital release before you go home.  If you experience worsening of your admission symptoms, develop shortness of breath, life threatening emergency, suicidal or  homicidal thoughts you must seek medical attention immediately by calling 911 or calling your MD immediately  if symptoms less severe.  You Must read complete instructions/literature along with all the possible adverse reactions/side effects for all the Medicines you take and that have been prescribed to you. Take any new Medicines after you have completely understood and accpet all the possible adverse reactions/side effects.   Increase activity slowly   Complete by: As directed    No wound care   Complete by: As directed        Discharge Medications   Allergies as of 01/22/2022       Reactions   Amoxicillin Anaphylaxis, Shortness Of Breath   Bee Venom Shortness Of Breath   Penicillins Anaphylaxis, Swelling, Other (See Comments)   Remote reaction- Throat swelling - compromised airway Tolerates ceftriaxone   Pneumococcal Vaccines Other (See Comments)   Per patient due to Brain Aneurysm   Sulfa Antibiotics Hives, Itching, Swelling   Bactrim [sulfamethoxazole-trimethoprim] Hives, Itching, Swelling, Other (See Comments)   Felt off balance        Medication List     STOP taking these medications    traZODone 50 MG tablet Commonly known as: DESYREL       TAKE these medications    Accu-Chek Aviva Plus w/Device Kit The patient IS insulin requiring, ICD 10 code E11.9. The patient tests 4 times per day.   accu-chek soft touch lancets Use as instructed   acetaminophen 500 MG tablet Commonly known as: TYLENOL Take 1 tablet (500 mg total) by mouth every 6 (six) hours as needed for mild pain or headache.   atorvastatin 40 MG tablet Commonly known as: LIPITOR Take 1 tablet (40 mg total) by mouth daily.   cyanocobalamin 1000 MCG tablet Commonly known as: VITAMIN B12 Take 1,000 mcg by mouth daily.   DULoxetine 60 MG capsule Commonly known as: CYMBALTA Take 60 mg by mouth daily.   ferrous sulfate 325 (65 FE) MG tablet Take 325 mg by mouth daily with breakfast.    gabapentin 300 MG capsule Commonly known as: NEURONTIN Take 300 mg in AM, 300 mg at noon, 600 mg at bedtime What changed:  how much to take how to take this when to take this additional instructions   glucose blood test strip Commonly known as: Accu-Chek Aviva Plus Use as instructed. The patient IS insulin requiring, ICD 10 code E11.9.  The patient tests 4 times per day.   indapamide 1.25 MG tablet Commonly known as: LOZOL Take 1.25 mg by mouth daily.   Insulin Pen Needle 32G X 4 MM Misc Use to inject lantus once dialy   Lantus SoloStar 100 UNIT/ML Solostar Pen Generic drug: insulin glargine Inject 32 Units into the skin at bedtime.   midodrine 5 MG tablet Commonly known as: PROAMATINE Take 1 tablet (5 mg total) by mouth 3 (three) times daily with meals.   NovoLOG FlexPen 100 UNIT/ML FlexPen Generic drug: insulin aspart Before each meal 3 times a day, 140-199 - 2 units, 200-250 - 4 units, 251-299 - 6 units,  300-349 - 8 units,  350 or above 10 units.   ondansetron 4 MG tablet Commonly known as: Zofran Take 1 tablet (4 mg total) by mouth every 8 (eight) hours as needed for nausea or vomiting.   pantoprazole 40 MG tablet Commonly known as: Protonix Take 1 tablet (40 mg total) by mouth 2 (two) times daily before a meal. What changed: when to take this   propranolol 20 MG tablet Commonly known as: INDERAL Take 1 tablet (20 mg total) by mouth 3 (three) times daily.   sucralfate 1 GM/10ML suspension Commonly known as: CARAFATE Take 10 mLs (1 g total) by mouth 4 (four) times daily -  with meals and at bedtime.   thiamine 100 MG tablet Commonly known as: Vitamin B-1 Take 100 mg by mouth daily.         Follow-up Information     Seward Carol, MD. Schedule an appointment as soon as possible for a visit in 1 week(s).   Specialty: Internal Medicine Why: Please get a referral to see a gastroenterologist within 1 to 2 weeks, you need EGD. Contact information: [address] [place] [address] [place] [address] [phone]                 Major procedures and Radiology Reports - PLEASE review detailed and final reports thoroughly  -      CT ABDOMEN PELVIS WO CONTRAST  Result Date: 01/20/2022 CLINICAL DATA:  Nausea vomiting for 3 days.  Abdominal pain. EXAM: CT ABDOMEN AND PELVIS WITHOUT CONTRAST TECHNIQUE: Multidetector CT imaging of the abdomen and pelvis was performed following the standard protocol without IV contrast. RADIATION DOSE REDUCTION: This exam was performed according to the departmental dose-optimization program which includes automated exposure control, adjustment of the mA and/or kV according to patient size and/or use of iterative reconstruction technique. COMPARISON:  11/21/2021 FINDINGS: Lower chest: Unremarkable. Hepatobiliary: No suspicious focal abnormality in the liver on this study without intravenous contrast. Gallbladder surgically absent. No intrahepatic or extrahepatic biliary dilation. Pancreas: No focal mass lesion. No dilatation of the main duct. No intraparenchymal cyst. No peripancreatic edema. Spleen: No splenomegaly. No focal mass lesion. Adrenals/Urinary Tract: No adrenal nodule or mass. Several punctate nonobstructing stones are seen in each kidney. Small cysts are again noted in both kidneys, similar to prior. No followup recommended. No evidence for hydroureter. Bladder is decompressed. Stomach/Bowel: Stomach is unremarkable. No gastric wall thickening. No evidence of outlet obstruction. Duodenum is normally positioned as is the ligament of Treitz. No small bowel wall thickening. No small bowel dilatation. The terminal ileum is normal. Nonvisualization of the appendix is consistent with the reported history of appendectomy. No gross colonic mass. No colonic wall thickening. Vascular/Lymphatic: There is moderate atherosclerotic calcification of the abdominal aorta without aneurysm. There is no gastrohepatic or  hepatoduodenal ligament lymphadenopathy.  No retroperitoneal or mesenteric lymphadenopathy. No pelvic sidewall lymphadenopathy. Reproductive: The uterus is surgically absent. There is no adnexal mass. Other: No intraperitoneal free fluid. Musculoskeletal: Fixation hardware noted proximal left femur. No worrisome lytic or sclerotic osseous abnormality. IMPRESSION: 1. No acute findings in the abdomen or pelvis. Specifically, no findings to explain the patient's history of nausea and vomiting. 2. Bilateral nonobstructing renal stones. 3. Aortic Atherosclerosis (ICD10-I70.0). Electronically Signed   By: Misty Stanley M.D.   On: 01/20/2022 08:26   DG Chest 2 View  Result Date: 01/20/2022 CLINICAL DATA:  Weakness, shortness of breath.  Suspected sepsis. EXAM: CHEST - 2 VIEW COMPARISON:  11/21/2021 FINDINGS: The heart size and mediastinal contours are within normal limits. Both lungs are clear. The visualized skeletal structures are unremarkable. IMPRESSION: No active cardiopulmonary disease. Electronically Signed   By: Kerby Moors M.D.   On: 01/20/2022 07:51    Today   Subjective    Alexandr Oehler today has no headache,no chest abdominal pain,no new weakness tingling or numbness, feels much better wants to go home today.   Objective   Blood pressure (!) 112/91, pulse 98, temperature 97.9 F (36.6 C), temperature source Oral, resp. rate 20, height 5' 7"  (1.702 m), weight 63.1 kg, SpO2 98 %.   Intake/Output Summary (Last 24 hours) at 01/22/2022 1031 Last data filed at 01/22/2022 0648 Gross per 24 hour  Intake 1389.09 ml  Output --  Net 1389.09 ml    Exam  Awake Alert, No new F.N deficits,    [place].AT,PERRAL Supple Neck,   Symmetrical Chest wall movement, Good air movement bilaterally, CTAB RRR,No Gallops,   +ve B.Sounds, Abd Soft, Non tender,  No Cyanosis, Clubbing or edema    Data Review   Recent Labs  Lab 01/20/22 0710 01/20/22 1651 01/21/22 0301 01/22/22 0416  WBC 16.2* 10.1 12.5*  12.7*  HGB 15.2* 11.7* 12.1 12.0  HCT 50.4* 35.8* 37.3 37.3  PLT 464* 312 328 304  MCV 80.6 75.7* 76.4* 76.9*  MCH 24.3* 24.7* 24.8* 24.7*  MCHC 30.2 32.7 32.4 32.2  RDW 17.0* 15.4 15.9* 15.8*  LYMPHSABS  --   --   --  2.7  MONOABS  --   --   --  0.8  EOSABS  --   --   --  0.3  BASOSABS  --   --   --  0.1    Recent Labs  Lab 01/20/22 0710 01/20/22 0940 01/20/22 1050 01/20/22 1724 01/21/22 0251 01/22/22 0416  NA 136 137  --  136 137 135  K 3.5 3.6  --  3.3* 3.4* 3.4*  CL 101 105  --  106 107 103  CO2 14* 16*  --  21* 22 22  GLUCOSE 159* 159*  --  144* 165* 140*  BUN 26* 24*  --  18 14 9   CREATININE 1.37* 1.42*  --  0.90 0.94 0.91  CALCIUM 9.6 8.7*  --  8.8* 8.6* 8.6*  AST 21  --   --   --   --   --   ALT 16  --   --   --   --   --   ALKPHOS 97  --   --   --   --   --   BILITOT 1.7*  --   --   --   --   --   ALBUMIN 4.1  --   --   --   --   --   MG  --   --   --   --  1.4* 1.8  LATICACIDVEN 2.9*  --  1.3  --   --   --   BNP  --   --   --   --   --  120.8*    Total Time in preparing paper work, data evaluation and todays exam - 35 minutes  Lala Lund M.D on 01/22/2022 at 10:31 AM  [facility]

## 2022-01-22 NOTE — TOC Transition Note (Signed)
Transition of Care [facility]) - CM/SW Discharge Note   Patient Details  Name: Laura Gentry MRN: [id number] Date of Birth: 02/14/1956  [facility]) CM/SW Contact:  Cyndi Bender, RN Phone Number: 01/22/2022, 11:33 AM   Clinical Narrative:     Patient stable for discharge.  Patient follows up with PCP and states she has been to the doctor a lot since June.   Patient has used [facility] for home health In the past but therapy recommended not follow up.  Patient can afford her prescriptions.  Sister can transport home. Patient usually drives herself but her sister can take her to apts if needed. No other TOC needs at this time. Final next level of care: Home/Self Care Barriers to Discharge: Barriers Resolved   Patient Goals and CMS Choice Patient states their goals for this hospitalization and ongoing recovery are:: return home      Discharge Placement                 home      Discharge Plan and Services                 home                    Social Determinants of Health (SDOH) Interventions     Readmission Risk Interventions    01/22/2022   11:32 AM 11/24/2021   11:10 AM  Readmission Risk Prevention Plan  Transportation Screening Complete Complete  Home Care Screening  Complete  Medication Review (RN CM)  Complete  HRI or Home Care Consult Complete   Social Work Consult for [place] Planning/Counseling Complete   Palliative Care Screening Not Applicable   Medication Review [employer]) Complete

## 2022-01-22 NOTE — Discharge Instructions (Signed)
Follow with Primary MD Seward Carol, MD in 7 days   Get CBC, CMP, magnesium-  checked next visit within 1 week by Primary MD  Activity: As tolerated with Full fall precautions use walker/cane & assistance as needed  Disposition Home   Diet: Heart Healthy low carbohydrate.  Monitor CBGs q. ACH S.  Accuchecks 4 times/day, Once in AM empty stomach and then before each meal. Log in all results and show them to your Prim.MD in 3 days. If any glucose reading is under 80 or above 300 call your Prim MD immidiately. Follow Low glucose instructions for glucose under 80 as instructed.  Special Instructions: If you have smoked or chewed Tobacco  in the last 2 yrs please stop smoking, stop any regular Alcohol  and or any Recreational drug use.  On your next visit with your primary care physician please Get Medicines reviewed and adjusted.  Please request your Prim.MD to go over all Hospital Tests and Procedure/Radiological results at the follow up, please get all Hospital records sent to your Prim MD by signing hospital release before you go home.  If you experience worsening of your admission symptoms, develop shortness of breath, life threatening emergency, suicidal or homicidal thoughts you must seek medical attention immediately by calling 911 or calling your MD immediately  if symptoms less severe.  You Must read complete instructions/literature along with all the possible adverse reactions/side effects for all the Medicines you take and that have been prescribed to you. Take any new Medicines after you have completely understood and accpet all the possible adverse reactions/side effects.

## 2022-01-25 LAB — CULTURE, BLOOD (ROUTINE X 2)
Culture: NO GROWTH
Culture: NO GROWTH
Special Requests: ADEQUATE

## 2022-01-26 ENCOUNTER — Other Ambulatory Visit (HOSPITAL_COMMUNITY): Payer: Self-pay

## 2022-01-26 ENCOUNTER — Other Ambulatory Visit: Payer: Self-pay

## 2022-01-28 ENCOUNTER — Ambulatory Visit (INDEPENDENT_AMBULATORY_CARE_PROVIDER_SITE_OTHER): Payer: Medicare HMO | Admitting: Orthopaedic Surgery

## 2022-01-28 ENCOUNTER — Encounter (HOSPITAL_BASED_OUTPATIENT_CLINIC_OR_DEPARTMENT_OTHER): Payer: Medicare HMO | Admitting: Orthopaedic Surgery

## 2022-01-28 ENCOUNTER — Ambulatory Visit (INDEPENDENT_AMBULATORY_CARE_PROVIDER_SITE_OTHER): Payer: Medicare HMO

## 2022-01-28 ENCOUNTER — Other Ambulatory Visit (HOSPITAL_BASED_OUTPATIENT_CLINIC_OR_DEPARTMENT_OTHER): Payer: Self-pay | Admitting: Orthopaedic Surgery

## 2022-01-28 DIAGNOSIS — E1169 Type 2 diabetes mellitus with other specified complication: Secondary | ICD-10-CM | POA: Diagnosis not present

## 2022-01-28 DIAGNOSIS — Z9889 Other specified postprocedural states: Secondary | ICD-10-CM | POA: Diagnosis not present

## 2022-01-28 DIAGNOSIS — I10 Essential (primary) hypertension: Secondary | ICD-10-CM | POA: Diagnosis not present

## 2022-01-28 DIAGNOSIS — E1310 Other specified diabetes mellitus with ketoacidosis without coma: Secondary | ICD-10-CM | POA: Diagnosis not present

## 2022-01-28 DIAGNOSIS — S72002D Fracture of unspecified part of neck of left femur, subsequent encounter for closed fracture with routine healing: Secondary | ICD-10-CM

## 2022-01-28 NOTE — Progress Notes (Signed)
                               Post Operative Evaluation    Procedure/Date of Surgery: 11/04/21 Left cannulated screw placement  Interval History:   Presents today for follow-up of her left hip status post cannulated screw placement done 6 weeks prior.  Overall she is doing very well.  She is now walking without any pain.  She is back to her baseline.  She is not using any assistive devices.  She is overall very happy with her outcome.   PMH/PSH/Family History/Social History/Meds/Allergies:    Past Medical History:  Diagnosis Date   Brain aneurysm 03/19/2013   Cataracts, bilateral    Cerebral aneurysm    Colon polyps    Diabetes (HCC) 2013   High cholesterol    History of kidney stones    Insomnia    Lumbosacral radiculopathy    Osteoporosis    Skin cancer    squamous   Thyroid disease    Tremor    Past Surgical History:  Procedure Laterality Date   APPENDECTOMY     BRAIN SURGERY  03/19/13   Sugita aneyrsum clip can have MRI up to 4T docs scanned in    CHOLECYSTECTOMY  2013   CYSTOSCOPY WITH RETROGRADE PYELOGRAM, URETEROSCOPY AND STENT PLACEMENT Bilateral 10/29/2021   Procedure: CYSTOSCOPY WITH RETROGRADE PYELOGRAM, URETEROSCOPY AND STENT PLACEMENT;  Surgeon: Manny, Theodore, MD;  Location: [facility];  Service: Urology;  Laterality: Bilateral;   CYSTOSCOPY WITH RETROGRADE PYELOGRAM, URETEROSCOPY AND STENT PLACEMENT Bilateral 11/19/2021   Procedure: CYSTOSCOPY WITH RETROGRADE PYELOGRAM, URETEROSCOPY AND STENT EXCHANGE, BASKET STONE REMOVAL;  Surgeon: Manny, Theodore, MD;  Location: [facility];  Service: Urology;  Laterality: Bilateral;  75 MINS   HIP PINNING,CANNULATED Left 11/04/2021   Procedure: CANNULATED HIP PINNING;  Surgeon: , , MD;  Location: [facility];  Service: Orthopedics;  Laterality: Left;   HOLMIUM LASER APPLICATION Bilateral 11/19/2021   Procedure: HOLMIUM LASER APPLICATION;  Surgeon: Manny, Theodore, MD;  Location: [facility];  Service: Urology;  Laterality:  Bilateral;   INNER EAR SURGERY     Lost Hearing   TONSILLECTOMY AND ADENOIDECTOMY     VAGINAL HYSTERECTOMY     At age 23 due to "growth"   Social History   Socioeconomic History   Marital status: Divorced    Spouse name: Not on file   Number of children: 0   Years of education: Not on file   Highest education level: Some college, no degree  Occupational History   Occupation: Pawn Broker     Employer: [employer]    Comment: disabled  Tobacco Use   Smoking status: Former    Packs/day: 0.50    Years: 40.00    Total pack years: 20.00    Types: Cigarettes    Quit date: 05/26/2011    Years since quitting: 10.6   Smokeless tobacco: Never  Vaping Use   Vaping Use: Never used  Substance and Sexual Activity   Alcohol use: No    Alcohol/week: 0.0 standard drinks of alcohol   Drug use: No   Sexual activity: Never  Other Topics Concern   Not on file  Social History Narrative   09/08/21 her mom lives with her   Caffeine-tea   Social Determinants of Health   Financial Resource Strain: Not on file  Food Insecurity: Not on file  Transportation Needs: Not on file  Physical   Activity: Not on file  Stress: Not on file  Social Connections: Not on file   Family History  Problem Relation Age of Onset   Asthma Mother    Allergies Mother    Diabetes Mother    Lung cancer Father        smoked   Throat cancer Father        smoked   Pancreatic cancer Brother    Hypertension Maternal Grandfather    Diabetes Maternal Grandfather    Emphysema Maternal Aunt        never smoked, spouse did   Breast cancer Maternal Aunt    Breast cancer Maternal Aunt    Breast cancer Paternal Aunt    Allergies  Allergen Reactions   Amoxicillin Anaphylaxis and Shortness Of Breath   Bee Venom Shortness Of Breath   Penicillins Anaphylaxis, Swelling and Other (See Comments)    Remote reaction- Throat swelling - compromised airway Tolerates ceftriaxone   Pneumococcal Vaccines Other  (See Comments)    Per patient due to Brain Aneurysm   Sulfa Antibiotics Hives, Itching and Swelling   Bactrim [Sulfamethoxazole-Trimethoprim] Hives, Itching, Swelling and Other (See Comments)    Felt off balance   Current Outpatient Medications  Medication Sig Dispense Refill   acetaminophen (TYLENOL) 500 MG tablet Take 1 tablet (500 mg total) by mouth every 6 (six) hours as needed for mild pain or headache. 30 tablet 0   atorvastatin (LIPITOR) 40 MG tablet Take 1 tablet (40 mg total) by mouth daily. 60 tablet 1   Blood Glucose Monitoring Suppl (ACCU-CHEK AVIVA PLUS) w/Device KIT The patient IS insulin requiring, ICD 10 code E11.9. The patient tests 4 times per day. 1 kit 0   DULoxetine (CYMBALTA) 60 MG capsule Take 60 mg by mouth daily.     ferrous sulfate 325 (65 FE) MG tablet Take 325 mg by mouth daily with breakfast.     gabapentin (NEURONTIN) 300 MG capsule Take 300 mg in AM, 300 mg at noon, 600 mg at bedtime (Patient taking differently: Take 300-600 mg by mouth See admin instructions. Take 300 mg by mouth in the morning, 300 mg at noon and 600 mg at bedtime) 120 capsule 0   glucose blood (ACCU-CHEK AVIVA PLUS) test strip Use as instructed. The patient IS insulin requiring, ICD 10 code E11.9. The patient tests 4 times per day. 100 each 12   indapamide (LOZOL) 1.25 MG tablet Take 1.25 mg by mouth daily.     insulin aspart (NOVOLOG) 100 UNIT/ML FlexPen Before each meal 3 times a day, 140-199 - 2 units, 200-250 - 4 units, 251-299 - 6 units,  300-349 - 8 units,  350 or above 10 units. 15 mL 0   insulin glargine (LANTUS SOLOSTAR) 100 UNIT/ML Solostar Pen Inject 32 Units into the skin at bedtime.     Insulin Pen Needle 32G X 4 MM MISC Use to inject lantus once dialy 100 each 4   Lancets (ACCU-CHEK SOFT TOUCH) lancets Use as instructed 100 each 12   midodrine (PROAMATINE) 5 MG tablet Take 1 tablet (5 mg total) by mouth 3 (three) times daily with meals. 90 tablet 1   ondansetron (ZOFRAN) 4 MG  tablet Take 1 tablet (4 mg total) by mouth every 8 (eight) hours as needed for nausea or vomiting. 20 tablet 0   pantoprazole (PROTONIX) 40 MG tablet Take 1 tablet (40 mg total) by mouth 2 (two) times daily before a meal. 60 tablet 0   propranolol (INDERAL) 20   MG tablet Take 1 tablet (20 mg total) by mouth 3 (three) times daily. 90 tablet 1   sucralfate (CARAFATE) 1 GM/10ML suspension Take 10 mLs (1 g total) by mouth 4 (four) times daily -  with meals and at bedtime. 420 mL 0   thiamine (VITAMIN B-1) 100 MG tablet Take 100 mg by mouth daily.     vitamin B-12 (CYANOCOBALAMIN) 1000 MCG tablet Take 1,000 mcg by mouth daily.     No current facility-administered medications for this visit.   No results found.  Review of Systems:   A ROS was performed including pertinent positives and negatives as documented in the HPI.   Musculoskeletal Exam:    There were no vitals taken for this visit.  Left hip incision is healed.  Range of motion is 30 degrees of internal and external rotation without pain.  There is 120 degrees of flexion actively.  Good strength with active flexion of the left hip.  2+ dorsalis pedis pulse.  Remainder of neurosensory exam is intact.   Imaging:    X-ray AP pelvis left hip 3 views: Status post cannulated screw placement without evidence of complication with healing at the fracture site  I personally reviewed and interpreted the radiographs.   Assessment:   12 weeks status post left hip screw cannulated pinning x-rays today show further healing.  At today's visit she will return to clinic as needed for the left hip.  Plan :    -Return to clinic as needed     I personally saw and evaluated the patient, and participated in the management and treatment plan.  Vanetta Mulders, MD Attending Physician, Orthopedic Surgery  This document was dictated using Dragon voice recognition software. A reasonable attempt at proof reading has been made to minimize errors.

## 2022-01-30 ENCOUNTER — Other Ambulatory Visit (HOSPITAL_COMMUNITY): Payer: Self-pay

## 2022-01-30 ENCOUNTER — Ambulatory Visit (INDEPENDENT_AMBULATORY_CARE_PROVIDER_SITE_OTHER): Payer: Medicare HMO | Admitting: Orthopaedic Surgery

## 2022-01-30 ENCOUNTER — Ambulatory Visit (INDEPENDENT_AMBULATORY_CARE_PROVIDER_SITE_OTHER): Payer: Medicare HMO

## 2022-01-30 DIAGNOSIS — M25561 Pain in right knee: Secondary | ICD-10-CM

## 2022-01-30 DIAGNOSIS — G8929 Other chronic pain: Secondary | ICD-10-CM

## 2022-01-30 DIAGNOSIS — M05861 Other rheumatoid arthritis with rheumatoid factor of right knee: Secondary | ICD-10-CM

## 2022-01-30 DIAGNOSIS — M05862 Other rheumatoid arthritis with rheumatoid factor of left knee: Secondary | ICD-10-CM | POA: Diagnosis not present

## 2022-01-30 DIAGNOSIS — M25562 Pain in left knee: Secondary | ICD-10-CM | POA: Diagnosis not present

## 2022-01-30 MED ORDER — TRIAMCINOLONE ACETONIDE 40 MG/ML IJ SUSP
80.0000 mg | INTRAMUSCULAR | Status: AC | PRN
  Administered 2022-01-30: 80 mg via INTRA_ARTICULAR

## 2022-01-30 MED ORDER — LIDOCAINE HCL 1 % IJ SOLN
4.0000 mL | INTRAMUSCULAR | Status: AC | PRN
  Administered 2022-01-30: 4 mL

## 2022-01-30 NOTE — Progress Notes (Signed)
Chief Complaint: Bilateral knee pain     History of Present Illness:    Laura Gentry is a 66 y.o. female presents with bilateral knee pain that has been going on for several years.  She is history of any specific injury.  She states that she was told that she had arthritis multiple years ago.  She is never had any injections or physical therapy.  No history of surgery.  She is here today for further assessment.    Surgical History:   None  PMH/PSH/Family History/Social History/Meds/Allergies:    Past Medical History:  Diagnosis Date  . Brain aneurysm 03/19/2013  . Cataracts, bilateral   . Cerebral aneurysm   . Colon polyps   . Diabetes ([place]) 2013  . High cholesterol   . History of kidney stones   . Insomnia   . Lumbosacral radiculopathy   . Osteoporosis   . Skin cancer    squamous  . Thyroid disease   . Tremor    Past Surgical History:  Procedure Laterality Date  . APPENDECTOMY    . BRAIN SURGERY  03/19/13   Sugita aneyrsum clip can have MRI up to [place] scanned in   . CHOLECYSTECTOMY  2013  . CYSTOSCOPY WITH RETROGRADE PYELOGRAM, URETEROSCOPY AND STENT PLACEMENT Bilateral 10/29/2021   Procedure: CYSTOSCOPY WITH RETROGRADE PYELOGRAM, URETEROSCOPY AND STENT PLACEMENT;  Surgeon: Alexis Frock, MD;  Location: [facility];  Service: Urology;  Laterality: Bilateral;  . CYSTOSCOPY WITH RETROGRADE PYELOGRAM, URETEROSCOPY AND STENT PLACEMENT Bilateral 11/19/2021   Procedure: CYSTOSCOPY WITH RETROGRADE PYELOGRAM, URETEROSCOPY AND STENT EXCHANGE, BASKET STONE REMOVAL;  Surgeon: Alexis Frock, MD;  Location: [facility];  Service: Urology;  Laterality: Bilateral;  75 MINS  . HIP PINNING,CANNULATED Left 11/04/2021   Procedure: CANNULATED HIP PINNING;  Surgeon: Vanetta Mulders, MD;  Location: [facility];  Service: Orthopedics;  Laterality: Left;  . HOLMIUM LASER APPLICATION Bilateral 9/37/1696   Procedure: HOLMIUM LASER APPLICATION;  Surgeon: Alexis Frock, MD;  Location: [facility];  Service: Urology;  Laterality: Bilateral;  . INNER EAR SURGERY     Lost Hearing  . TONSILLECTOMY AND ADENOIDECTOMY    . VAGINAL HYSTERECTOMY     At age 54 due to "growth"   Social History   Socioeconomic History  . Marital status: Divorced    Spouse name: Not on file  . Number of children: 0  . Years of education: Not on file  . Highest education level: Some college, no degree  Occupational History  . Occupation: Educational psychologist: [employer]    Comment: disabled  Tobacco Use  . Smoking status: Former    Packs/day: 0.50    Years: 40.00    Total pack years: 20.00    Types: Cigarettes    Quit date: 05/26/2011    Years since quitting: 10.6  . Smokeless tobacco: Never  Vaping Use  . Vaping Use: Never used  Substance and Sexual Activity  . Alcohol use: No    Alcohol/week: 0.0 standard drinks of alcohol  . Drug use: No  . Sexual activity: Never  Other Topics Concern  . Not on file  Social History Narrative   09/08/21 her mom lives with her   Caffeine-tea   Social Determinants of Health   Financial Resource Strain: Not on file  Food Insecurity: Not on file  Transportation Needs: Not on file  Physical Activity: Not on file  Stress: Not on file  Social Connections: Not on file   Family History  Problem Relation Age of Onset  . Asthma Mother   . Allergies Mother   . Diabetes Mother   . Lung cancer Father        smoked  . Throat cancer Father        smoked  . Pancreatic cancer Brother   . Hypertension Maternal Grandfather   . Diabetes Maternal Grandfather   . Emphysema Maternal Aunt        never smoked, spouse did  . Breast cancer Maternal Aunt   . Breast cancer Maternal Aunt   . Breast cancer Paternal Aunt    Allergies  Allergen Reactions  . Amoxicillin Anaphylaxis and Shortness Of Breath  . Bee Venom Shortness Of Breath  . Penicillins Anaphylaxis, Swelling and Other (See Comments)    Remote reaction-  Throat swelling - compromised airway Tolerates ceftriaxone  . Pneumococcal Vaccines Other (See Comments)    Per patient due to Brain Aneurysm  . Sulfa Antibiotics Hives, Itching and Swelling  . Bactrim [Sulfamethoxazole-Trimethoprim] Hives, Itching, Swelling and Other (See Comments)    Felt off balance   Current Outpatient Medications  Medication Sig Dispense Refill  . acetaminophen (TYLENOL) 500 MG tablet Take 1 tablet (500 mg total) by mouth every 6 (six) hours as needed for mild pain or headache. 30 tablet 0  . atorvastatin (LIPITOR) 40 MG tablet Take 1 tablet (40 mg total) by mouth daily. 60 tablet 1  . Blood Glucose Monitoring Suppl (ACCU-CHEK AVIVA PLUS) w/Device KIT The patient IS insulin requiring, ICD 10 code E11.9. The patient tests 4 times per day. 1 kit 0  . DULoxetine (CYMBALTA) 60 MG capsule Take 60 mg by mouth daily.    . ferrous sulfate 325 (65 FE) MG tablet Take 325 mg by mouth daily with breakfast.    . gabapentin (NEURONTIN) 300 MG capsule Take 300 mg in AM, 300 mg at noon, 600 mg at bedtime (Patient taking differently: Take 300-600 mg by mouth See admin instructions. Take 300 mg by mouth in the morning, 300 mg at noon and 600 mg at bedtime) 120 capsule 0  . glucose blood (ACCU-CHEK AVIVA PLUS) test strip Use as instructed. The patient IS insulin requiring, ICD 10 code E11.9. The patient tests 4 times per day. 100 each 12  . indapamide (LOZOL) 1.25 MG tablet Take 1.25 mg by mouth daily.    . insulin aspart (NOVOLOG) 100 UNIT/ML FlexPen Before each meal 3 times a day, 140-199 - 2 units, 200-250 - 4 units, 251-299 - 6 units,  300-349 - 8 units,  350 or above 10 units. 15 mL 0  . insulin glargine (LANTUS SOLOSTAR) 100 UNIT/ML Solostar Pen Inject 32 Units into the skin at bedtime.    . Insulin Pen Needle 32G X 4 MM MISC Use to inject lantus once dialy 100 each 4  . Lancets (ACCU-CHEK SOFT TOUCH) lancets Use as instructed 100 each 12  . midodrine (PROAMATINE) 5 MG tablet Take 1  tablet (5 mg total) by mouth 3 (three) times daily with meals. 90 tablet 1  . ondansetron (ZOFRAN) 4 MG tablet Take 1 tablet (4 mg total) by mouth every 8 (eight) hours as needed for nausea or vomiting. 20 tablet 0  . pantoprazole (PROTONIX) 40 MG tablet Take 1 tablet (40 mg total) by mouth 2 (two)  times daily before a meal. 60 tablet 0  . propranolol (INDERAL) 20 MG tablet Take 1 tablet (20 mg total) by mouth 3 (three) times daily. 90 tablet 1  . sucralfate (CARAFATE) 1 GM/10ML suspension Take 10 mLs (1 g total) by mouth 4 (four) times daily -  with meals and at bedtime. 420 mL 0  . thiamine (VITAMIN B-1) 100 MG tablet Take 100 mg by mouth daily.    . vitamin B-12 (CYANOCOBALAMIN) 1000 MCG tablet Take 1,000 mcg by mouth daily.     No current facility-administered medications for this visit.   No results found.  Review of Systems:   A ROS was performed including pertinent positives and negatives as documented in the HPI.  Physical Exam :   Constitutional: NAD and appears stated age Neurological: Alert and oriented Psych: Appropriate affect and cooperative There were no vitals taken for this visit.   Comprehensive Musculoskeletal Exam:      Musculoskeletal Exam  Gait Normal  Alignment Normal   Right Left  Inspection Normal Normal  Palpation    Tenderness Medial joint Medial joint  Crepitus None None  Effusion None None  Range of Motion    Extension 0 0  Flexion 135 135  Strength    Extension 5/5 5/5  Flexion 5/5 5/5  Ligament Exam     Generalized Laxity No No  Lachman Negative Negative   Pivot Shift Negative Negative  Anterior Drawer Negative Negative  Valgus at 0 Negative Negative  Valgus at 20 Negative Negative  Varus at 0 0 0  Varus at 20   0 0  Posterior Drawer at 90 0 0  Vascular/Lymphatic Exam    Edema None None  Venous Stasis Changes No No  Distal Circulation Normal Normal  Neurologic    Light Touch Sensation Intact Intact  Special Tests:      Imaging:    Xray (bilateral knee 4 views): Normal   I personally reviewed and interpreted the radiographs.   Assessment:   66 y.o. female with bilateral knee pain consistent with possible degenerative meniscal injuries.  X-rays today show well-maintained joint spaces.  I recommended bilateral ultrasound-guided knee injections at today's visit.  I will plan to see her back on an as-needed basis  Plan :    -Bilateral knee ultrasound-guided injections performed after verbal consent obtained    Procedure Note  Patient: Laura Gentry             Date of Birth: Apr 09, 1956           MRN: [id number]             Visit Date: 01/30/2022  Procedures: Visit Diagnoses:  1. Chronic pain of both knees     Large Joint Inj: R knee on 01/30/2022 2:47 PM Indications: pain Details: 22 G 1.5 in needle, ultrasound-guided anterior approach  Arthrogram: No  Medications: 4 mL lidocaine 1 %; 80 mg triamcinolone acetonide 40 MG/ML Outcome: tolerated well, no immediate complications Procedure, treatment alternatives, risks and benefits explained, specific risks discussed. Consent was given by the patient. Immediately prior to procedure a time out was called to verify the correct patient, procedure, equipment, support staff and site/side marked as required. Patient was prepped and draped in the usual sterile fashion.    Large Joint Inj: L knee on 01/30/2022 2:47 PM Indications: pain Details: 22 G 1.5 in needle, ultrasound-guided anterior approach  Arthrogram: No  Medications: 4 mL lidocaine 1 %; 80 mg triamcinolone acetonide 40 MG/ML  Outcome: tolerated well, no immediate complications Procedure, treatment alternatives, risks and benefits explained, specific risks discussed. Consent was given by the patient. Immediately prior to procedure a time out was called to verify the correct patient, procedure, equipment, support staff and site/side marked as required. Patient was prepped and draped in the usual sterile  fashion.          I personally saw and evaluated the patient, and participated in the management and treatment plan.  Vanetta Mulders, MD Attending Physician, Orthopedic Surgery  This document was dictated using Dragon voice recognition software. A reasonable attempt at proof reading has been made to minimize errors.

## 2022-02-11 DIAGNOSIS — M109 Gout, unspecified: Secondary | ICD-10-CM | POA: Diagnosis not present

## 2022-02-11 DIAGNOSIS — E118 Type 2 diabetes mellitus with unspecified complications: Secondary | ICD-10-CM | POA: Diagnosis not present

## 2022-02-11 DIAGNOSIS — K219 Gastro-esophageal reflux disease without esophagitis: Secondary | ICD-10-CM | POA: Diagnosis not present

## 2022-02-11 DIAGNOSIS — I671 Cerebral aneurysm, nonruptured: Secondary | ICD-10-CM | POA: Diagnosis not present

## 2022-02-11 DIAGNOSIS — E782 Mixed hyperlipidemia: Secondary | ICD-10-CM | POA: Diagnosis not present

## 2022-02-11 DIAGNOSIS — I700 Atherosclerosis of aorta: Secondary | ICD-10-CM | POA: Diagnosis not present

## 2022-02-11 DIAGNOSIS — I10 Essential (primary) hypertension: Secondary | ICD-10-CM | POA: Diagnosis not present

## 2022-02-11 DIAGNOSIS — R251 Tremor, unspecified: Secondary | ICD-10-CM | POA: Diagnosis not present

## 2022-02-11 DIAGNOSIS — R1310 Dysphagia, unspecified: Secondary | ICD-10-CM | POA: Diagnosis not present

## 2022-02-12 DIAGNOSIS — E1169 Type 2 diabetes mellitus with other specified complication: Secondary | ICD-10-CM | POA: Diagnosis not present

## 2022-02-16 ENCOUNTER — Other Ambulatory Visit: Payer: Self-pay

## 2022-02-16 ENCOUNTER — Other Ambulatory Visit (HOSPITAL_COMMUNITY): Payer: Self-pay

## 2022-02-25 DIAGNOSIS — Z8781 Personal history of (healed) traumatic fracture: Secondary | ICD-10-CM | POA: Diagnosis not present

## 2022-02-25 DIAGNOSIS — E118 Type 2 diabetes mellitus with unspecified complications: Secondary | ICD-10-CM | POA: Diagnosis not present

## 2022-02-25 DIAGNOSIS — R4589 Other symptoms and signs involving emotional state: Secondary | ICD-10-CM | POA: Diagnosis not present

## 2022-02-25 DIAGNOSIS — I10 Essential (primary) hypertension: Secondary | ICD-10-CM | POA: Diagnosis not present

## 2022-02-25 DIAGNOSIS — R251 Tremor, unspecified: Secondary | ICD-10-CM | POA: Diagnosis not present

## 2022-02-25 DIAGNOSIS — Z6822 Body mass index (BMI) 22.0-22.9, adult: Secondary | ICD-10-CM | POA: Diagnosis not present

## 2022-03-16 DIAGNOSIS — Z6823 Body mass index (BMI) 23.0-23.9, adult: Secondary | ICD-10-CM | POA: Diagnosis not present

## 2022-03-16 DIAGNOSIS — R4589 Other symptoms and signs involving emotional state: Secondary | ICD-10-CM | POA: Diagnosis not present

## 2022-03-16 DIAGNOSIS — Z8781 Personal history of (healed) traumatic fracture: Secondary | ICD-10-CM | POA: Diagnosis not present

## 2022-03-16 DIAGNOSIS — I10 Essential (primary) hypertension: Secondary | ICD-10-CM | POA: Diagnosis not present

## 2022-03-16 DIAGNOSIS — R251 Tremor, unspecified: Secondary | ICD-10-CM | POA: Diagnosis not present

## 2022-03-16 DIAGNOSIS — E118 Type 2 diabetes mellitus with unspecified complications: Secondary | ICD-10-CM | POA: Diagnosis not present

## 2022-03-20 DIAGNOSIS — S8261XA Displaced fracture of lateral malleolus of right fibula, initial encounter for closed fracture: Secondary | ICD-10-CM | POA: Diagnosis not present

## 2022-03-23 ENCOUNTER — Ambulatory Visit (INDEPENDENT_AMBULATORY_CARE_PROVIDER_SITE_OTHER): Payer: Medicare HMO

## 2022-03-23 ENCOUNTER — Other Ambulatory Visit (HOSPITAL_BASED_OUTPATIENT_CLINIC_OR_DEPARTMENT_OTHER): Payer: Self-pay

## 2022-03-23 ENCOUNTER — Ambulatory Visit (INDEPENDENT_AMBULATORY_CARE_PROVIDER_SITE_OTHER): Payer: Medicare HMO | Admitting: Orthopaedic Surgery

## 2022-03-23 DIAGNOSIS — M25571 Pain in right ankle and joints of right foot: Secondary | ICD-10-CM

## 2022-03-23 DIAGNOSIS — S82831A Other fracture of upper and lower end of right fibula, initial encounter for closed fracture: Secondary | ICD-10-CM

## 2022-03-23 MED ORDER — HYDROCODONE-ACETAMINOPHEN 5-325 MG PO TABS
1.0000 | ORAL_TABLET | Freq: Four times a day (QID) | ORAL | 0 refills | Status: DC | PRN
  Filled 2022-03-23: qty 20, 5d supply, fill #0

## 2022-03-23 NOTE — Progress Notes (Signed)
Chief Complaint: Right ankle pain     History of Present Illness:    Laura Gentry is a 66 y.o. female presents today with right ankle pain after she tripped this most recent Thursday while going up a hill and slipping and losing her footing.  She presented to urgent care was found to have a Weber B fibula fracture.  She is here today for further follow-up.  She was put in a short cam boot walker and subsequently made weightbearing as tolerated.  She has been able to put some weight on the ankle although this has been quite painful.    Surgical History:   None  PMH/PSH/Family History/Social History/Meds/Allergies:    Past Medical History:  Diagnosis Date   Brain aneurysm 03/19/2013   Cataracts, bilateral    Cerebral aneurysm    Colon polyps    Diabetes ([place]) 2013   High cholesterol    History of kidney stones    Insomnia    Lumbosacral radiculopathy    Osteoporosis    Skin cancer    squamous   Thyroid disease    Tremor    Past Surgical History:  Procedure Laterality Date   APPENDECTOMY     BRAIN SURGERY  03/19/13   Sugita aneyrsum clip can have MRI up to [place] scanned in    CHOLECYSTECTOMY  2013   [place], URETEROSCOPY AND STENT PLACEMENT Bilateral 10/29/2021   Procedure: [place], URETEROSCOPY AND STENT PLACEMENT;  Surgeon: Alexis Frock, MD;  [facility]: [facility];  Service: Urology;  Laterality: Bilateral;   CYSTOSCOPY WITH RETROGRADE PYELOGRAM, URETEROSCOPY AND STENT PLACEMENT Bilateral 11/19/2021   Procedure: CYSTOSCOPY WITH RETROGRADE PYELOGRAM, URETEROSCOPY AND STENT EXCHANGE, BASKET STONE REMOVAL;  Surgeon: Alexis Frock, MD;  [facility];  Service: Urology;  Laterality: Bilateral;  75 MINS   HIP PINNING,CANNULATED Left 11/04/2021   Procedure: CANNULATED HIP PINNING;  Surgeon: Vanetta Mulders, MD;  Location: [facility];  Service: Orthopedics;  Laterality: Left;   HOLMIUM  LASER APPLICATION Bilateral 11/28/2374   Procedure: HOLMIUM LASER APPLICATION;  Surgeon: Alexis Frock, MD;  [facility];  Service: Urology;  Laterality: Bilateral;   INNER EAR SURGERY     Lost Hearing   TONSILLECTOMY AND ADENOIDECTOMY     VAGINAL HYSTERECTOMY     At age 19 due to "growth"   Social History   Socioeconomic History   Marital status: Divorced    Spouse name: Not on file   Number of children: 0   Years of education: Not on file   Highest education level: Some college, no degree  Occupational History   Occupation: Educational psychologist: [employer]    Comment: disabled  Tobacco Use   Smoking status: Former    Packs/day: 0.50    Years: 40.00    Total pack years: 20.00    Types: Cigarettes    Quit date: 05/26/2011    Years since quitting: 10.8   Smokeless tobacco: Never  Vaping Use   Vaping Use: Never used  Substance and Sexual Activity   Alcohol use: No    Alcohol/week: 0.0 standard drinks of alcohol   Drug use: No   Sexual activity: Never  Other Topics Concern   Not on file  Social History Narrative  09/08/21 her mom lives with her   Caffeine-tea   Social Determinants of Health   Financial Resource Strain: Not on file  Food Insecurity: Not on file  Transportation Needs: Not on file  Physical Activity: Not on file  Stress: Not on file  Social Connections: Not on file   Family History  Problem Relation Age of Onset   Asthma Mother    Allergies Mother    Diabetes Mother    Lung cancer Father        smoked   Throat cancer Father        smoked   Pancreatic cancer Brother    Hypertension Maternal Grandfather    Diabetes Maternal Grandfather    Emphysema Maternal Aunt        never smoked, spouse did   Breast cancer Maternal Aunt    Breast cancer Maternal Aunt    Breast cancer Paternal Aunt    Allergies  Allergen Reactions   Amoxicillin Anaphylaxis and Shortness Of Breath   Bee Venom Shortness Of Breath   Penicillins  Anaphylaxis, Swelling and Other (See Comments)    Remote reaction- Throat swelling - compromised airway Tolerates ceftriaxone   Pneumococcal Vaccines Other (See Comments)    Per patient due to Brain Aneurysm   Sulfa Antibiotics Hives, Itching and Swelling   Bactrim [Sulfamethoxazole-Trimethoprim] Hives, Itching, Swelling and Other (See Comments)    Felt off balance   Current Outpatient Medications  Medication Sig Dispense Refill   HYDROcodone-acetaminophen (NORCO/VICODIN) 5-325 MG tablet Take 1 tablet by mouth every 6 (six) hours as needed for moderate pain. 20 tablet 0   acetaminophen (TYLENOL) 500 MG tablet Take 1 tablet (500 mg total) by mouth every 6 (six) hours as needed for mild pain or headache. 30 tablet 0   atorvastatin (LIPITOR) 40 MG tablet Take 1 tablet (40 mg total) by mouth daily. 60 tablet 1   Blood Glucose Monitoring Suppl (ACCU-CHEK AVIVA PLUS) w/Device KIT The patient IS insulin requiring, ICD 10 code E11.9. The patient tests 4 times per day. 1 kit 0   DULoxetine (CYMBALTA) 60 MG capsule Take 60 mg by mouth daily.     ferrous sulfate 325 (65 FE) MG tablet Take 325 mg by mouth daily with breakfast.     gabapentin (NEURONTIN) 300 MG capsule Take 300 mg in AM, 300 mg at noon, 600 mg at bedtime (Patient taking differently: Take 300-600 mg by mouth See admin instructions. Take 300 mg by mouth in the morning, 300 mg at noon and 600 mg at bedtime) 120 capsule 0   glucose blood (ACCU-CHEK AVIVA PLUS) test strip Use as instructed. The patient IS insulin requiring, ICD 10 code E11.9. The patient tests 4 times per day. 100 each 12   indapamide (LOZOL) 1.25 MG tablet Take 1.25 mg by mouth daily.     insulin aspart (NOVOLOG) 100 UNIT/ML FlexPen Inject Before each meal 3 times a day, 140-199 - 2 units, 200-250 - 4 units, 251-299 - 6 units,  300-349 - 8 units,  350 or above 10 units. 15 mL 0   insulin glargine (LANTUS SOLOSTAR) 100 UNIT/ML Solostar Pen Inject 32 Units into the skin at  bedtime.     Insulin Pen Needle 32G X 4 MM MISC Use to inject lantus once dialy 100 each 4   Lancets (ACCU-CHEK SOFT TOUCH) lancets Use as instructed 100 each 12   midodrine (PROAMATINE) 5 MG tablet Take 1 tablet (5 mg total) by mouth 3 (three) times daily with  meals. 90 tablet 1   ondansetron (ZOFRAN) 4 MG tablet Take 1 tablet (4 mg total) by mouth every 8 (eight) hours as needed for nausea or vomiting. 20 tablet 0   pantoprazole (PROTONIX) 40 MG tablet Take 1 tablet (40 mg total) by mouth 2 (two) times daily before a meal. 60 tablet 0   propranolol (INDERAL) 20 MG tablet Take 1 tablet (20 mg total) by mouth 3 (three) times daily. 90 tablet 1   sucralfate (CARAFATE) 1 GM/10ML suspension Take 10 mLs (1 g total) by mouth 4 (four) times daily -  with meals and at bedtime. 420 mL 0   thiamine (VITAMIN B-1) 100 MG tablet Take 100 mg by mouth daily.     vitamin B-12 (CYANOCOBALAMIN) 1000 MCG tablet Take 1,000 mcg by mouth daily.     No current facility-administered medications for this visit.   No results found.  Review of Systems:   A ROS was performed including pertinent positives and negatives as documented in the HPI.  Physical Exam :   Constitutional: NAD and appears stated age Neurological: Alert and oriented Psych: Appropriate affect and cooperative There were no vitals taken for this visit.   Comprehensive Musculoskeletal Exam:    Right ankle with swelling and pain.  There are no wounds about the lateral malleolus.  Range of motion is limited due to pain.  Sensation is intact in all distributional's with 2+ dorsalis pedis pulse.  There is no wrinkling  Imaging:   Xray (3 views right ankle): Weber B fibular fracture with approximately 2 to 3 mm of displacement    I personally reviewed and interpreted the radiographs.   Assessment:   66 y.o. female with a right Weber B fibula fracture that appears to be stable in nature.  I did discuss treatment options with her today.  I did  discuss that due to the displacement profile that I do believe that she could potentially be a surgical candidate in order to allow for earlier and more functional weightbearing.  I did review this with her.  That being said I did also offer conservative management given the fact that she has been weightbearing without displacement.  I did discuss that this may take longer to heal compared to conservative management although with open reduction internal fixation there would be risks associated with hardware.  At this time she will continue to weight-bear as tolerated and she will come back to see me on Friday after comparing both options.  Plan :    -Return to clinic Friday for reassessment     I personally saw and evaluated the patient, and participated in the management and treatment plan.  Vanetta Mulders, MD Attending Physician, Orthopedic Surgery  This document was dictated using Dragon voice recognition software. A reasonable attempt at proof reading has been made to minimize errors.

## 2022-03-25 ENCOUNTER — Telehealth (HOSPITAL_BASED_OUTPATIENT_CLINIC_OR_DEPARTMENT_OTHER): Payer: Self-pay | Admitting: Orthopaedic Surgery

## 2022-03-25 NOTE — Telephone Encounter (Signed)
Patient wants to know if she can do a video call instead of coming in please call the patient [phone]

## 2022-03-27 ENCOUNTER — Ambulatory Visit (HOSPITAL_BASED_OUTPATIENT_CLINIC_OR_DEPARTMENT_OTHER): Payer: Medicare HMO | Admitting: Orthopaedic Surgery

## 2022-04-10 ENCOUNTER — Ambulatory Visit (INDEPENDENT_AMBULATORY_CARE_PROVIDER_SITE_OTHER): Payer: Medicare HMO | Admitting: Orthopaedic Surgery

## 2022-04-10 ENCOUNTER — Ambulatory Visit (INDEPENDENT_AMBULATORY_CARE_PROVIDER_SITE_OTHER): Payer: Medicare HMO

## 2022-04-10 DIAGNOSIS — M25571 Pain in right ankle and joints of right foot: Secondary | ICD-10-CM | POA: Diagnosis not present

## 2022-04-10 DIAGNOSIS — S82831A Other fracture of upper and lower end of right fibula, initial encounter for closed fracture: Secondary | ICD-10-CM | POA: Diagnosis not present

## 2022-04-10 NOTE — Progress Notes (Signed)
Chief Complaint: Right ankle pain     History of Present Illness:    Laura Gentry is a 66 y.o. female presents today for follow-up of right ankle.  Overall she continues to improve.  Swelling is now minimal.  She is walking in a cam boot without any assistive devices.  Has little to no pain.   Surgical History:   None  PMH/PSH/Family History/Social History/Meds/Allergies:    Past Medical History:  Diagnosis Date   Brain aneurysm 03/19/2013   Cataracts, bilateral    Cerebral aneurysm    Colon polyps    Diabetes ([place]) 2013   High cholesterol    History of kidney stones    Insomnia    Lumbosacral radiculopathy    Osteoporosis    Skin cancer    squamous   Thyroid disease    Tremor    Past Surgical History:  Procedure Laterality Date   APPENDECTOMY     BRAIN SURGERY  03/19/13   Sugita aneyrsum clip can have MRI up to [place] scanned in    CHOLECYSTECTOMY  2013   [place], URETEROSCOPY AND STENT PLACEMENT Bilateral 10/29/2021   Procedure: [place], URETEROSCOPY AND STENT PLACEMENT;  Surgeon: Alexis Frock, MD;  Location: [facility];  Service: Urology;  Laterality: Bilateral;   CYSTOSCOPY WITH RETROGRADE PYELOGRAM, URETEROSCOPY AND STENT PLACEMENT Bilateral 11/19/2021   Procedure: CYSTOSCOPY WITH RETROGRADE PYELOGRAM, URETEROSCOPY AND STENT EXCHANGE, BASKET STONE REMOVAL;  Surgeon: Alexis Frock, MD;  Location: [facility];  Service: Urology;  Laterality: Bilateral;  75 MINS   HIP PINNING,CANNULATED Left 11/04/2021   Procedure: CANNULATED HIP PINNING;  Surgeon: Vanetta Mulders, MD;  Location: [facility];  Service: Orthopedics;  Laterality: Left;   HOLMIUM LASER APPLICATION Bilateral 5/37/9432   Procedure: HOLMIUM LASER APPLICATION;  Surgeon: Alexis Frock, MD;  Location: [facility];  Service: Urology;  Laterality: Bilateral;   INNER EAR SURGERY     Lost Hearing   TONSILLECTOMY AND ADENOIDECTOMY      VAGINAL HYSTERECTOMY     At age 8 due to "growth"   Social History   Socioeconomic History   Marital status: Divorced    Spouse name: Not on file   Number of children: 0   Years of education: Not on file   Highest education level: Some college, no degree  Occupational History   Occupation: Educational psychologist: [employer]    Comment: disabled  Tobacco Use   Smoking status: Former    Packs/day: 0.50    Years: 40.00    Total pack years: 20.00    Types: Cigarettes    Quit date: 05/26/2011    Years since quitting: 10.8   Smokeless tobacco: Never  Vaping Use   Vaping Use: Never used  Substance and Sexual Activity   Alcohol use: No    Alcohol/week: 0.0 standard drinks of alcohol   Drug use: No   Sexual activity: Never  Other Topics Concern   Not on file  Social History Narrative   09/08/21 her mom lives with her   Caffeine-tea   Social Determinants of Health   Financial Resource Strain: Not on file  Food Insecurity: Not on file  Transportation Needs: Not on file  Physical Activity: Not on file  Stress: Not on file  Social Connections: Not on file   Family History  Problem Relation Age of Onset   Asthma Mother    Allergies Mother    Diabetes Mother    Lung cancer Father        smoked   Throat cancer Father        smoked   Pancreatic cancer Brother    Hypertension Maternal Grandfather    Diabetes Maternal Grandfather    Emphysema Maternal Aunt        never smoked, spouse did   Breast cancer Maternal Aunt    Breast cancer Maternal Aunt    Breast cancer Paternal Aunt    Allergies  Allergen Reactions   Amoxicillin Anaphylaxis and Shortness Of Breath   Bee Venom Shortness Of Breath   Penicillins Anaphylaxis, Swelling and Other (See Comments)    Remote reaction- Throat swelling - compromised airway Tolerates ceftriaxone   Pneumococcal Vaccines Other (See Comments)    Per patient due to Brain Aneurysm   Sulfa Antibiotics Hives, Itching  and Swelling   Bactrim [Sulfamethoxazole-Trimethoprim] Hives, Itching, Swelling and Other (See Comments)    Felt off balance   Current Outpatient Medications  Medication Sig Dispense Refill   acetaminophen (TYLENOL) 500 MG tablet Take 1 tablet (500 mg total) by mouth every 6 (six) hours as needed for mild pain or headache. 30 tablet 0   atorvastatin (LIPITOR) 40 MG tablet Take 1 tablet (40 mg total) by mouth daily. 60 tablet 1   Blood Glucose Monitoring Suppl (ACCU-CHEK AVIVA PLUS) w/Device KIT The patient IS insulin requiring, ICD 10 code E11.9. The patient tests 4 times per day. 1 kit 0   DULoxetine (CYMBALTA) 60 MG capsule Take 60 mg by mouth daily.     ferrous sulfate 325 (65 FE) MG tablet Take 325 mg by mouth daily with breakfast.     gabapentin (NEURONTIN) 300 MG capsule Take 300 mg in AM, 300 mg at noon, 600 mg at bedtime (Patient taking differently: Take 300-600 mg by mouth See admin instructions. Take 300 mg by mouth in the morning, 300 mg at noon and 600 mg at bedtime) 120 capsule 0   glucose blood (ACCU-CHEK AVIVA PLUS) test strip Use as instructed. The patient IS insulin requiring, ICD 10 code E11.9. The patient tests 4 times per day. 100 each 12   HYDROcodone-acetaminophen (NORCO/VICODIN) 5-325 MG tablet Take 1 tablet by mouth every 6 (six) hours as needed for moderate pain. 20 tablet 0   indapamide (LOZOL) 1.25 MG tablet Take 1.25 mg by mouth daily.     insulin aspart (NOVOLOG) 100 UNIT/ML FlexPen Inject Before each meal 3 times a day, 140-199 - 2 units, 200-250 - 4 units, 251-299 - 6 units,  300-349 - 8 units,  350 or above 10 units. 15 mL 0   insulin glargine (LANTUS SOLOSTAR) 100 UNIT/ML Solostar Pen Inject 32 Units into the skin at bedtime.     Insulin Pen Needle 32G X 4 MM MISC Use to inject lantus once dialy 100 each 4   Lancets (ACCU-CHEK SOFT TOUCH) lancets Use as instructed 100 each 12   midodrine (PROAMATINE) 5 MG tablet Take 1 tablet (5 mg total) by mouth 3 (three) times  daily with meals. 90 tablet 1   ondansetron (ZOFRAN) 4 MG tablet Take 1 tablet (4 mg total) by mouth every 8 (eight) hours as needed for nausea or vomiting. 20 tablet 0   pantoprazole (PROTONIX) 40 MG tablet Take 1 tablet (40 mg total) by mouth  2 (two) times daily before a meal. 60 tablet 0   propranolol (INDERAL) 20 MG tablet Take 1 tablet (20 mg total) by mouth 3 (three) times daily. 90 tablet 1   sucralfate (CARAFATE) 1 GM/10ML suspension Take 10 mLs (1 g total) by mouth 4 (four) times daily -  with meals and at bedtime. 420 mL 0   thiamine (VITAMIN B-1) 100 MG tablet Take 100 mg by mouth daily.     vitamin B-12 (CYANOCOBALAMIN) 1000 MCG tablet Take 1,000 mcg by mouth daily.     No current facility-administered medications for this visit.   No results found.  Review of Systems:   A ROS was performed including pertinent positives and negatives as documented in the HPI.  Physical Exam :   Constitutional: NAD and appears stated age Neurological: Alert and oriented Psych: Appropriate affect and cooperative There were no vitals taken for this visit.   Comprehensive Musculoskeletal Exam:    Right ankle with swelling and pain.  There are no wounds about the lateral malleolus.  Range of motion is limited due to pain.  Sensation is intact in all distributional's with 2+ dorsalis pedis pulse.  There is no wrinkling  Imaging:   Xray (3 views right ankle): Weber B fibular fracture with approximately 2 to 3 mm of displacement with interval callus formation    I personally reviewed and interpreted the radiographs.   Assessment:   67 y.o. female with a right Weber B fibula fracture that appears to be stable in nature.  I did describe that there is some early callus formation.  At this time she may transition to a normal shoe.  I have asked her to refrain from doing any higher impact activities at this point.  I will plan to see her back in 1 month for x-rays Plan :    -Return to clinic in 1  month for x-rays     I personally saw and evaluated the patient, and participated in the management and treatment plan.  Vanetta Mulders, MD Attending Physician, Orthopedic Surgery  This document was dictated using Dragon voice recognition software. A reasonable attempt at proof reading has been made to minimize errors.

## 2022-04-13 ENCOUNTER — Other Ambulatory Visit (HOSPITAL_COMMUNITY): Payer: Self-pay

## 2022-04-13 DIAGNOSIS — R4589 Other symptoms and signs involving emotional state: Secondary | ICD-10-CM | POA: Diagnosis not present

## 2022-04-13 DIAGNOSIS — E118 Type 2 diabetes mellitus with unspecified complications: Secondary | ICD-10-CM | POA: Diagnosis not present

## 2022-04-13 DIAGNOSIS — I10 Essential (primary) hypertension: Secondary | ICD-10-CM | POA: Diagnosis not present

## 2022-04-13 DIAGNOSIS — Z6823 Body mass index (BMI) 23.0-23.9, adult: Secondary | ICD-10-CM | POA: Diagnosis not present

## 2022-04-27 DIAGNOSIS — R4589 Other symptoms and signs involving emotional state: Secondary | ICD-10-CM | POA: Diagnosis not present

## 2022-04-27 DIAGNOSIS — I10 Essential (primary) hypertension: Secondary | ICD-10-CM | POA: Diagnosis not present

## 2022-04-27 DIAGNOSIS — Z6823 Body mass index (BMI) 23.0-23.9, adult: Secondary | ICD-10-CM | POA: Diagnosis not present

## 2022-04-27 DIAGNOSIS — E663 Overweight: Secondary | ICD-10-CM | POA: Diagnosis not present

## 2022-04-27 DIAGNOSIS — E118 Type 2 diabetes mellitus with unspecified complications: Secondary | ICD-10-CM | POA: Diagnosis not present

## 2022-05-08 ENCOUNTER — Ambulatory Visit (INDEPENDENT_AMBULATORY_CARE_PROVIDER_SITE_OTHER): Payer: Medicare HMO

## 2022-05-08 ENCOUNTER — Ambulatory Visit (INDEPENDENT_AMBULATORY_CARE_PROVIDER_SITE_OTHER): Payer: Medicare HMO | Admitting: Orthopaedic Surgery

## 2022-05-08 DIAGNOSIS — S82831A Other fracture of upper and lower end of right fibula, initial encounter for closed fracture: Secondary | ICD-10-CM

## 2022-05-08 NOTE — Progress Notes (Signed)
Chief Complaint: Right ankle pain     History of Present Illness:   05/08/2022: Presents today for follow-up of her right ankle.  Overall she is doing much better.  She has no pain or swelling.  She has been walking in a normal shoe.  She is overall very satisfied with her progress.   Surgical History:   None  PMH/PSH/Family History/Social History/Meds/Allergies:    Past Medical History:  Diagnosis Date   Brain aneurysm 03/19/2013   Cataracts, bilateral    Cerebral aneurysm    Colon polyps    Diabetes ([place]) 2013   High cholesterol    History of kidney stones    Insomnia    Lumbosacral radiculopathy    Osteoporosis    Skin cancer    squamous   Thyroid disease    Tremor    Past Surgical History:  Procedure Laterality Date   APPENDECTOMY     BRAIN SURGERY  03/19/13   Sugita aneyrsum clip can have MRI up to [place] scanned in    CHOLECYSTECTOMY  2013   [place], URETEROSCOPY AND STENT PLACEMENT Bilateral 10/29/2021   Procedure: [place], URETEROSCOPY AND STENT PLACEMENT;  Surgeon: Alexis Frock, MD;  [facility];  Service: Urology;  Laterality: Bilateral;   CYSTOSCOPY WITH RETROGRADE PYELOGRAM, URETEROSCOPY AND STENT PLACEMENT Bilateral 11/19/2021   Procedure: CYSTOSCOPY WITH RETROGRADE PYELOGRAM, URETEROSCOPY AND STENT EXCHANGE, BASKET STONE REMOVAL;  Surgeon: Alexis Frock, MD;  [facility];  Service: Urology;  Laterality: Bilateral;  75 MINS   HIP PINNING,CANNULATED Left 11/04/2021   Procedure: CANNULATED HIP PINNING;  Surgeon: Vanetta Mulders, MD;  Location: [facility];  Service: Orthopedics;  Laterality: Left;   HOLMIUM LASER APPLICATION Bilateral 5/62/1308   Procedure: HOLMIUM LASER APPLICATION;  Surgeon: Alexis Frock, MD;  [facility];  Service: Urology;  Laterality: Bilateral;   INNER EAR SURGERY     Lost Hearing   TONSILLECTOMY AND ADENOIDECTOMY     VAGINAL  HYSTERECTOMY     At age 78 due to "growth"   Social History   Socioeconomic History   Marital status: Divorced    Spouse name: Not on file   Number of children: 0   Years of education: Not on file   Highest education level: Some college, no degree  Occupational History   Occupation: Educational psychologist: [employer]    Comment: disabled  Tobacco Use   Smoking status: Former    Packs/day: 0.50    Years: 40.00    Total pack years: 20.00    Types: Cigarettes    Quit date: 05/26/2011    Years since quitting: 10.9   Smokeless tobacco: Never  Vaping Use   Vaping Use: Never used  Substance and Sexual Activity   Alcohol use: No    Alcohol/week: 0.0 standard drinks of alcohol   Drug use: No   Sexual activity: Never  Other Topics Concern   Not on file  Social History Narrative   09/08/21 her mom lives with her   Caffeine-tea   Social Determinants of Health   Financial Resource Strain: Not on file  Food Insecurity: Not on file  Transportation Needs: Not on file  Physical Activity: Not on file  Stress: Not on file  Social Connections: Not  on file   Family History  Problem Relation Age of Onset   Asthma Mother    Allergies Mother    Diabetes Mother    Lung cancer Father        smoked   Throat cancer Father        smoked   Pancreatic cancer Brother    Hypertension Maternal Grandfather    Diabetes Maternal Grandfather    Emphysema Maternal Aunt        never smoked, spouse did   Breast cancer Maternal Aunt    Breast cancer Maternal Aunt    Breast cancer Paternal Aunt    Allergies  Allergen Reactions   Amoxicillin Anaphylaxis and Shortness Of Breath   Bee Venom Shortness Of Breath   Penicillins Anaphylaxis, Swelling and Other (See Comments)    Remote reaction- Throat swelling - compromised airway Tolerates ceftriaxone   Pneumococcal Vaccines Other (See Comments)    Per patient due to Brain Aneurysm   Sulfa Antibiotics Hives, Itching and Swelling    Bactrim [Sulfamethoxazole-Trimethoprim] Hives, Itching, Swelling and Other (See Comments)    Felt off balance   Current Outpatient Medications  Medication Sig Dispense Refill   acetaminophen (TYLENOL) 500 MG tablet Take 1 tablet (500 mg total) by mouth every 6 (six) hours as needed for mild pain or headache. 30 tablet 0   atorvastatin (LIPITOR) 40 MG tablet Take 1 tablet (40 mg total) by mouth daily. 60 tablet 1   Blood Glucose Monitoring Suppl (ACCU-CHEK AVIVA PLUS) w/Device KIT The patient IS insulin requiring, ICD 10 code E11.9. The patient tests 4 times per day. 1 kit 0   DULoxetine (CYMBALTA) 60 MG capsule Take 60 mg by mouth daily.     ferrous sulfate 325 (65 FE) MG tablet Take 325 mg by mouth daily with breakfast.     gabapentin (NEURONTIN) 300 MG capsule Take 300 mg in AM, 300 mg at noon, 600 mg at bedtime (Patient taking differently: Take 300-600 mg by mouth See admin instructions. Take 300 mg by mouth in the morning, 300 mg at noon and 600 mg at bedtime) 120 capsule 0   glucose blood (ACCU-CHEK AVIVA PLUS) test strip Use as instructed. The patient IS insulin requiring, ICD 10 code E11.9. The patient tests 4 times per day. 100 each 12   HYDROcodone-acetaminophen (NORCO/VICODIN) 5-325 MG tablet Take 1 tablet by mouth every 6 (six) hours as needed for moderate pain. 20 tablet 0   indapamide (LOZOL) 1.25 MG tablet Take 1.25 mg by mouth daily.     insulin aspart (NOVOLOG) 100 UNIT/ML FlexPen Inject Before each meal 3 times a day, 140-199 - 2 units, 200-250 - 4 units, 251-299 - 6 units,  300-349 - 8 units,  350 or above 10 units. 15 mL 0   insulin glargine (LANTUS SOLOSTAR) 100 UNIT/ML Solostar Pen Inject 32 Units into the skin at bedtime.     Insulin Pen Needle 32G X 4 MM MISC Use to inject lantus once dialy 100 each 4   Lancets (ACCU-CHEK SOFT TOUCH) lancets Use as instructed 100 each 12   midodrine (PROAMATINE) 5 MG tablet Take 1 tablet (5 mg total) by mouth 3 (three) times daily with  meals. 90 tablet 1   ondansetron (ZOFRAN) 4 MG tablet Take 1 tablet (4 mg total) by mouth every 8 (eight) hours as needed for nausea or vomiting. 20 tablet 0   pantoprazole (PROTONIX) 40 MG tablet Take 1 tablet (40 mg total) by mouth 2 (two) times  daily before a meal. 60 tablet 0   propranolol (INDERAL) 20 MG tablet Take 1 tablet (20 mg total) by mouth 3 (three) times daily. 90 tablet 1   sucralfate (CARAFATE) 1 GM/10ML suspension Take 10 mLs (1 g total) by mouth 4 (four) times daily -  with meals and at bedtime. 420 mL 0   thiamine (VITAMIN B-1) 100 MG tablet Take 100 mg by mouth daily.     vitamin B-12 (CYANOCOBALAMIN) 1000 MCG tablet Take 1,000 mcg by mouth daily.     No current facility-administered medications for this visit.   No results found.  Review of Systems:   A ROS was performed including pertinent positives and negatives as documented in the HPI.  Physical Exam :   Constitutional: NAD and appears stated age Neurological: Alert and oriented Psych: Appropriate affect and cooperative There were no vitals taken for this visit.   Comprehensive Musculoskeletal Exam:    Right ankle with swelling and pain.  There are no wounds about the lateral malleolus.  Range of motion is limited due to pain.  Sensation is intact in all distributional's with 2+ dorsalis pedis pulse.  There is no wrinkling  Imaging:   Xray (3 views right ankle): Weber B fibular fracture with approximately 2 to 3 mm of displacement with interval callus formation    I personally reviewed and interpreted the radiographs.   Assessment:   66 y.o. female with a right Weber B fibula fracture that appears to be stable in nature.  Overall she is doing quite well at today's visit.  X-rays show significant callus formation.  I will plan to see her back on an as-needed basis Plan :    -Return to clinic as needed     I personally saw and evaluated the patient, and participated in the management and treatment  plan.  Vanetta Mulders, MD Attending Physician, Orthopedic Surgery  This document was dictated using Dragon voice recognition software. A reasonable attempt at proof reading has been made to minimize errors.

## 2022-05-14 DIAGNOSIS — R051 Acute cough: Secondary | ICD-10-CM | POA: Diagnosis not present

## 2022-05-14 DIAGNOSIS — J101 Influenza due to other identified influenza virus with other respiratory manifestations: Secondary | ICD-10-CM | POA: Diagnosis not present

## 2022-05-21 DIAGNOSIS — J180 Bronchopneumonia, unspecified organism: Secondary | ICD-10-CM | POA: Diagnosis not present

## 2022-05-21 DIAGNOSIS — R051 Acute cough: Secondary | ICD-10-CM | POA: Diagnosis not present

## 2022-05-22 DIAGNOSIS — I10 Essential (primary) hypertension: Secondary | ICD-10-CM | POA: Diagnosis not present

## 2022-05-22 DIAGNOSIS — D500 Iron deficiency anemia secondary to blood loss (chronic): Secondary | ICD-10-CM | POA: Diagnosis not present

## 2022-05-22 DIAGNOSIS — K219 Gastro-esophageal reflux disease without esophagitis: Secondary | ICD-10-CM | POA: Diagnosis not present

## 2022-05-22 DIAGNOSIS — E782 Mixed hyperlipidemia: Secondary | ICD-10-CM | POA: Diagnosis not present

## 2022-05-22 DIAGNOSIS — M810 Age-related osteoporosis without current pathological fracture: Secondary | ICD-10-CM | POA: Diagnosis not present

## 2022-05-22 DIAGNOSIS — E1169 Type 2 diabetes mellitus with other specified complication: Secondary | ICD-10-CM | POA: Diagnosis not present

## 2022-06-02 DIAGNOSIS — Z794 Long term (current) use of insulin: Secondary | ICD-10-CM | POA: Diagnosis not present

## 2022-06-02 DIAGNOSIS — G250 Essential tremor: Secondary | ICD-10-CM | POA: Diagnosis not present

## 2022-06-02 DIAGNOSIS — E7800 Pure hypercholesterolemia, unspecified: Secondary | ICD-10-CM | POA: Diagnosis not present

## 2022-06-02 DIAGNOSIS — E1169 Type 2 diabetes mellitus with other specified complication: Secondary | ICD-10-CM | POA: Diagnosis not present

## 2022-06-02 DIAGNOSIS — I700 Atherosclerosis of aorta: Secondary | ICD-10-CM | POA: Diagnosis not present

## 2022-06-02 DIAGNOSIS — M810 Age-related osteoporosis without current pathological fracture: Secondary | ICD-10-CM | POA: Diagnosis not present

## 2022-07-15 DIAGNOSIS — E1169 Type 2 diabetes mellitus with other specified complication: Secondary | ICD-10-CM | POA: Diagnosis not present

## 2022-07-15 DIAGNOSIS — E782 Mixed hyperlipidemia: Secondary | ICD-10-CM | POA: Diagnosis not present

## 2022-07-15 DIAGNOSIS — M810 Age-related osteoporosis without current pathological fracture: Secondary | ICD-10-CM | POA: Diagnosis not present

## 2022-07-15 DIAGNOSIS — I10 Essential (primary) hypertension: Secondary | ICD-10-CM | POA: Diagnosis not present

## 2022-07-15 DIAGNOSIS — K219 Gastro-esophageal reflux disease without esophagitis: Secondary | ICD-10-CM | POA: Diagnosis not present

## 2022-07-21 DIAGNOSIS — E1169 Type 2 diabetes mellitus with other specified complication: Secondary | ICD-10-CM | POA: Diagnosis not present

## 2022-08-03 DIAGNOSIS — M25571 Pain in right ankle and joints of right foot: Secondary | ICD-10-CM | POA: Diagnosis not present

## 2022-08-03 DIAGNOSIS — S8261XS Displaced fracture of lateral malleolus of right fibula, sequela: Secondary | ICD-10-CM | POA: Diagnosis not present

## 2022-08-21 DIAGNOSIS — E1169 Type 2 diabetes mellitus with other specified complication: Secondary | ICD-10-CM | POA: Diagnosis not present

## 2022-09-03 ENCOUNTER — Other Ambulatory Visit: Payer: Self-pay | Admitting: Internal Medicine

## 2022-09-03 DIAGNOSIS — Z1231 Encounter for screening mammogram for malignant neoplasm of breast: Secondary | ICD-10-CM

## 2022-09-08 DIAGNOSIS — R051 Acute cough: Secondary | ICD-10-CM | POA: Diagnosis not present

## 2022-09-08 DIAGNOSIS — J189 Pneumonia, unspecified organism: Secondary | ICD-10-CM | POA: Diagnosis not present

## 2022-09-08 DIAGNOSIS — J029 Acute pharyngitis, unspecified: Secondary | ICD-10-CM | POA: Diagnosis not present

## 2022-09-08 DIAGNOSIS — R0789 Other chest pain: Secondary | ICD-10-CM | POA: Diagnosis not present

## 2022-09-22 DIAGNOSIS — R0600 Dyspnea, unspecified: Secondary | ICD-10-CM | POA: Diagnosis not present

## 2022-09-22 DIAGNOSIS — E119 Type 2 diabetes mellitus without complications: Secondary | ICD-10-CM | POA: Diagnosis not present

## 2022-09-22 DIAGNOSIS — I700 Atherosclerosis of aorta: Secondary | ICD-10-CM | POA: Diagnosis not present

## 2022-09-22 DIAGNOSIS — E1169 Type 2 diabetes mellitus with other specified complication: Secondary | ICD-10-CM | POA: Diagnosis not present

## 2022-09-22 DIAGNOSIS — M810 Age-related osteoporosis without current pathological fracture: Secondary | ICD-10-CM | POA: Diagnosis not present

## 2022-09-22 DIAGNOSIS — Z794 Long term (current) use of insulin: Secondary | ICD-10-CM | POA: Diagnosis not present

## 2022-09-22 DIAGNOSIS — R251 Tremor, unspecified: Secondary | ICD-10-CM | POA: Diagnosis not present

## 2022-09-22 DIAGNOSIS — Z0000 Encounter for general adult medical examination without abnormal findings: Secondary | ICD-10-CM | POA: Diagnosis not present

## 2022-09-22 DIAGNOSIS — Z8701 Personal history of pneumonia (recurrent): Secondary | ICD-10-CM | POA: Diagnosis not present

## 2022-09-22 DIAGNOSIS — E7800 Pure hypercholesterolemia, unspecified: Secondary | ICD-10-CM | POA: Diagnosis not present

## 2022-10-15 ENCOUNTER — Other Ambulatory Visit: Payer: Self-pay | Admitting: Internal Medicine

## 2022-10-15 DIAGNOSIS — M810 Age-related osteoporosis without current pathological fracture: Secondary | ICD-10-CM

## 2022-10-20 ENCOUNTER — Ambulatory Visit
Admission: RE | Admit: 2022-10-20 | Discharge: 2022-10-20 | Disposition: A | Discharge status: Home / Self Care | Payer: Medicare HMO | Source: Ambulatory Visit | Attending: Internal Medicine | Admitting: Internal Medicine

## 2022-10-20 DIAGNOSIS — M810 Age-related osteoporosis without current pathological fracture: Secondary | ICD-10-CM | POA: Diagnosis not present

## 2022-10-20 DIAGNOSIS — D500 Iron deficiency anemia secondary to blood loss (chronic): Secondary | ICD-10-CM | POA: Diagnosis not present

## 2022-10-20 DIAGNOSIS — Z1231 Encounter for screening mammogram for malignant neoplasm of breast: Secondary | ICD-10-CM

## 2022-10-20 DIAGNOSIS — G43909 Migraine, unspecified, not intractable, without status migrainosus: Secondary | ICD-10-CM | POA: Diagnosis not present

## 2022-10-20 DIAGNOSIS — E1169 Type 2 diabetes mellitus with other specified complication: Secondary | ICD-10-CM | POA: Diagnosis not present

## 2022-10-20 DIAGNOSIS — E782 Mixed hyperlipidemia: Secondary | ICD-10-CM | POA: Diagnosis not present

## 2022-10-20 DIAGNOSIS — K219 Gastro-esophageal reflux disease without esophagitis: Secondary | ICD-10-CM | POA: Diagnosis not present

## 2022-10-29 DIAGNOSIS — E1169 Type 2 diabetes mellitus with other specified complication: Secondary | ICD-10-CM | POA: Diagnosis not present

## 2022-10-29 DIAGNOSIS — E119 Type 2 diabetes mellitus without complications: Secondary | ICD-10-CM | POA: Diagnosis not present

## 2022-10-29 DIAGNOSIS — F419 Anxiety disorder, unspecified: Secondary | ICD-10-CM | POA: Diagnosis not present

## 2022-10-29 DIAGNOSIS — G4700 Insomnia, unspecified: Secondary | ICD-10-CM | POA: Diagnosis not present

## 2022-10-29 DIAGNOSIS — F4320 Adjustment disorder, unspecified: Secondary | ICD-10-CM | POA: Diagnosis not present

## 2022-11-19 DIAGNOSIS — G43909 Migraine, unspecified, not intractable, without status migrainosus: Secondary | ICD-10-CM | POA: Diagnosis not present

## 2022-11-19 DIAGNOSIS — K219 Gastro-esophageal reflux disease without esophagitis: Secondary | ICD-10-CM | POA: Diagnosis not present

## 2022-11-19 DIAGNOSIS — E1169 Type 2 diabetes mellitus with other specified complication: Secondary | ICD-10-CM | POA: Diagnosis not present

## 2022-11-19 DIAGNOSIS — E782 Mixed hyperlipidemia: Secondary | ICD-10-CM | POA: Diagnosis not present

## 2022-11-19 DIAGNOSIS — M810 Age-related osteoporosis without current pathological fracture: Secondary | ICD-10-CM | POA: Diagnosis not present

## 2022-11-19 DIAGNOSIS — D500 Iron deficiency anemia secondary to blood loss (chronic): Secondary | ICD-10-CM | POA: Diagnosis not present

## 2022-11-22 DIAGNOSIS — F419 Anxiety disorder, unspecified: Secondary | ICD-10-CM | POA: Diagnosis not present

## 2022-12-10 DIAGNOSIS — G4700 Insomnia, unspecified: Secondary | ICD-10-CM | POA: Diagnosis not present

## 2022-12-10 DIAGNOSIS — G43909 Migraine, unspecified, not intractable, without status migrainosus: Secondary | ICD-10-CM | POA: Diagnosis not present

## 2022-12-10 DIAGNOSIS — E119 Type 2 diabetes mellitus without complications: Secondary | ICD-10-CM | POA: Diagnosis not present

## 2022-12-10 DIAGNOSIS — K219 Gastro-esophageal reflux disease without esophagitis: Secondary | ICD-10-CM | POA: Diagnosis not present

## 2022-12-10 DIAGNOSIS — M810 Age-related osteoporosis without current pathological fracture: Secondary | ICD-10-CM | POA: Diagnosis not present

## 2022-12-10 DIAGNOSIS — D500 Iron deficiency anemia secondary to blood loss (chronic): Secondary | ICD-10-CM | POA: Diagnosis not present

## 2022-12-10 DIAGNOSIS — E782 Mixed hyperlipidemia: Secondary | ICD-10-CM | POA: Diagnosis not present

## 2022-12-23 DIAGNOSIS — F4323 Adjustment disorder with mixed anxiety and depressed mood: Secondary | ICD-10-CM | POA: Diagnosis not present

## 2023-01-23 DIAGNOSIS — F4323 Adjustment disorder with mixed anxiety and depressed mood: Secondary | ICD-10-CM | POA: Diagnosis not present

## 2023-02-12 DIAGNOSIS — R509 Fever, unspecified: Secondary | ICD-10-CM | POA: Diagnosis not present

## 2023-02-12 DIAGNOSIS — R5383 Other fatigue: Secondary | ICD-10-CM | POA: Diagnosis not present

## 2023-02-12 DIAGNOSIS — R051 Acute cough: Secondary | ICD-10-CM | POA: Diagnosis not present

## 2023-02-12 DIAGNOSIS — R112 Nausea with vomiting, unspecified: Secondary | ICD-10-CM | POA: Diagnosis not present

## 2023-02-12 DIAGNOSIS — R519 Headache, unspecified: Secondary | ICD-10-CM | POA: Diagnosis not present

## 2023-02-12 DIAGNOSIS — E1365 Other specified diabetes mellitus with hyperglycemia: Secondary | ICD-10-CM | POA: Diagnosis not present

## 2023-02-12 DIAGNOSIS — U071 COVID-19: Secondary | ICD-10-CM | POA: Diagnosis not present

## 2023-02-12 DIAGNOSIS — M791 Myalgia, unspecified site: Secondary | ICD-10-CM | POA: Diagnosis not present

## 2023-02-22 DIAGNOSIS — F4323 Adjustment disorder with mixed anxiety and depressed mood: Secondary | ICD-10-CM | POA: Diagnosis not present

## 2023-02-24 DIAGNOSIS — E119 Type 2 diabetes mellitus without complications: Secondary | ICD-10-CM | POA: Diagnosis not present

## 2023-02-24 DIAGNOSIS — E1169 Type 2 diabetes mellitus with other specified complication: Secondary | ICD-10-CM | POA: Diagnosis not present

## 2023-02-24 DIAGNOSIS — I959 Hypotension, unspecified: Secondary | ICD-10-CM | POA: Diagnosis not present

## 2023-03-17 DIAGNOSIS — Z23 Encounter for immunization: Secondary | ICD-10-CM | POA: Diagnosis not present

## 2023-03-17 DIAGNOSIS — R519 Headache, unspecified: Secondary | ICD-10-CM | POA: Diagnosis not present

## 2023-03-23 DIAGNOSIS — Z794 Long term (current) use of insulin: Secondary | ICD-10-CM | POA: Diagnosis not present

## 2023-03-23 DIAGNOSIS — E7800 Pure hypercholesterolemia, unspecified: Secondary | ICD-10-CM | POA: Diagnosis not present

## 2023-03-23 DIAGNOSIS — I700 Atherosclerosis of aorta: Secondary | ICD-10-CM | POA: Diagnosis not present

## 2023-03-23 DIAGNOSIS — E1169 Type 2 diabetes mellitus with other specified complication: Secondary | ICD-10-CM | POA: Diagnosis not present

## 2023-03-23 DIAGNOSIS — R251 Tremor, unspecified: Secondary | ICD-10-CM | POA: Diagnosis not present

## 2023-03-23 DIAGNOSIS — F4323 Adjustment disorder with mixed anxiety and depressed mood: Secondary | ICD-10-CM | POA: Diagnosis not present

## 2023-03-23 DIAGNOSIS — M810 Age-related osteoporosis without current pathological fracture: Secondary | ICD-10-CM | POA: Diagnosis not present

## 2023-03-23 DIAGNOSIS — E119 Type 2 diabetes mellitus without complications: Secondary | ICD-10-CM | POA: Diagnosis not present

## 2023-03-25 DIAGNOSIS — F4323 Adjustment disorder with mixed anxiety and depressed mood: Secondary | ICD-10-CM | POA: Diagnosis not present

## 2023-04-13 DIAGNOSIS — E782 Mixed hyperlipidemia: Secondary | ICD-10-CM | POA: Diagnosis not present

## 2023-04-13 DIAGNOSIS — E119 Type 2 diabetes mellitus without complications: Secondary | ICD-10-CM | POA: Diagnosis not present

## 2023-05-12 ENCOUNTER — Inpatient Hospital Stay: Admission: RE | Admit: 2023-05-12 | Payer: Medicare HMO | Source: Ambulatory Visit

## 2023-05-12 ENCOUNTER — Other Ambulatory Visit: Payer: Self-pay | Admitting: Internal Medicine

## 2023-05-12 DIAGNOSIS — M810 Age-related osteoporosis without current pathological fracture: Secondary | ICD-10-CM

## 2023-05-13 DIAGNOSIS — K219 Gastro-esophageal reflux disease without esophagitis: Secondary | ICD-10-CM | POA: Diagnosis not present

## 2023-05-13 DIAGNOSIS — E782 Mixed hyperlipidemia: Secondary | ICD-10-CM | POA: Diagnosis not present

## 2023-05-13 DIAGNOSIS — E1169 Type 2 diabetes mellitus with other specified complication: Secondary | ICD-10-CM | POA: Diagnosis not present

## 2023-05-13 DIAGNOSIS — E7800 Pure hypercholesterolemia, unspecified: Secondary | ICD-10-CM | POA: Diagnosis not present

## 2023-06-22 DIAGNOSIS — Z03818 Encounter for observation for suspected exposure to other biological agents ruled out: Secondary | ICD-10-CM | POA: Diagnosis not present

## 2023-06-22 DIAGNOSIS — J069 Acute upper respiratory infection, unspecified: Secondary | ICD-10-CM | POA: Diagnosis not present

## 2023-06-23 DIAGNOSIS — E1165 Type 2 diabetes mellitus with hyperglycemia: Secondary | ICD-10-CM | POA: Diagnosis not present

## 2023-07-28 DIAGNOSIS — E1165 Type 2 diabetes mellitus with hyperglycemia: Secondary | ICD-10-CM | POA: Diagnosis not present

## 2023-07-29 DIAGNOSIS — R42 Dizziness and giddiness: Secondary | ICD-10-CM | POA: Diagnosis not present

## 2023-07-29 DIAGNOSIS — I951 Orthostatic hypotension: Secondary | ICD-10-CM | POA: Diagnosis not present

## 2023-07-29 DIAGNOSIS — E1169 Type 2 diabetes mellitus with other specified complication: Secondary | ICD-10-CM | POA: Diagnosis not present

## 2023-08-05 DIAGNOSIS — I10 Essential (primary) hypertension: Secondary | ICD-10-CM | POA: Diagnosis not present

## 2023-08-05 DIAGNOSIS — E1169 Type 2 diabetes mellitus with other specified complication: Secondary | ICD-10-CM | POA: Diagnosis not present

## 2023-08-12 DIAGNOSIS — I700 Atherosclerosis of aorta: Secondary | ICD-10-CM | POA: Diagnosis not present

## 2023-08-12 DIAGNOSIS — E1165 Type 2 diabetes mellitus with hyperglycemia: Secondary | ICD-10-CM | POA: Diagnosis not present

## 2023-08-12 DIAGNOSIS — Z8781 Personal history of (healed) traumatic fracture: Secondary | ICD-10-CM | POA: Diagnosis not present

## 2023-08-12 DIAGNOSIS — M810 Age-related osteoporosis without current pathological fracture: Secondary | ICD-10-CM | POA: Diagnosis not present

## 2023-08-12 DIAGNOSIS — Z8616 Personal history of COVID-19: Secondary | ICD-10-CM | POA: Diagnosis not present

## 2023-08-12 DIAGNOSIS — E1169 Type 2 diabetes mellitus with other specified complication: Secondary | ICD-10-CM | POA: Diagnosis not present

## 2023-08-17 DIAGNOSIS — I951 Orthostatic hypotension: Secondary | ICD-10-CM | POA: Diagnosis not present

## 2023-08-17 DIAGNOSIS — I10 Essential (primary) hypertension: Secondary | ICD-10-CM | POA: Diagnosis not present

## 2023-08-17 DIAGNOSIS — F4320 Adjustment disorder, unspecified: Secondary | ICD-10-CM | POA: Diagnosis not present

## 2023-08-17 DIAGNOSIS — M545 Low back pain, unspecified: Secondary | ICD-10-CM | POA: Diagnosis not present

## 2023-08-17 DIAGNOSIS — M109 Gout, unspecified: Secondary | ICD-10-CM | POA: Diagnosis not present

## 2023-08-17 DIAGNOSIS — Z794 Long term (current) use of insulin: Secondary | ICD-10-CM | POA: Diagnosis not present

## 2023-08-17 DIAGNOSIS — E785 Hyperlipidemia, unspecified: Secondary | ICD-10-CM | POA: Diagnosis not present

## 2023-08-17 DIAGNOSIS — E039 Hypothyroidism, unspecified: Secondary | ICD-10-CM | POA: Diagnosis not present

## 2023-08-17 DIAGNOSIS — Z8701 Personal history of pneumonia (recurrent): Secondary | ICD-10-CM | POA: Diagnosis not present

## 2023-08-17 DIAGNOSIS — M8580 Other specified disorders of bone density and structure, unspecified site: Secondary | ICD-10-CM | POA: Diagnosis not present

## 2023-08-17 DIAGNOSIS — K760 Fatty (change of) liver, not elsewhere classified: Secondary | ICD-10-CM | POA: Diagnosis not present

## 2023-08-17 DIAGNOSIS — H3530 Unspecified macular degeneration: Secondary | ICD-10-CM | POA: Diagnosis not present

## 2023-08-17 DIAGNOSIS — K219 Gastro-esophageal reflux disease without esophagitis: Secondary | ICD-10-CM | POA: Diagnosis not present

## 2023-08-17 DIAGNOSIS — F5101 Primary insomnia: Secondary | ICD-10-CM | POA: Diagnosis not present

## 2023-08-17 DIAGNOSIS — M1990 Unspecified osteoarthritis, unspecified site: Secondary | ICD-10-CM | POA: Diagnosis not present

## 2023-08-17 DIAGNOSIS — Z8249 Family history of ischemic heart disease and other diseases of the circulatory system: Secondary | ICD-10-CM | POA: Diagnosis not present

## 2023-08-17 DIAGNOSIS — G250 Essential tremor: Secondary | ICD-10-CM | POA: Diagnosis not present

## 2023-08-17 DIAGNOSIS — E119 Type 2 diabetes mellitus without complications: Secondary | ICD-10-CM | POA: Diagnosis not present

## 2023-08-17 DIAGNOSIS — R269 Unspecified abnormalities of gait and mobility: Secondary | ICD-10-CM | POA: Diagnosis not present

## 2023-08-17 DIAGNOSIS — F325 Major depressive disorder, single episode, in full remission: Secondary | ICD-10-CM | POA: Diagnosis not present

## 2023-08-17 DIAGNOSIS — G43909 Migraine, unspecified, not intractable, without status migrainosus: Secondary | ICD-10-CM | POA: Diagnosis not present

## 2023-08-17 DIAGNOSIS — G603 Idiopathic progressive neuropathy: Secondary | ICD-10-CM | POA: Diagnosis not present

## 2023-09-06 DIAGNOSIS — E1165 Type 2 diabetes mellitus with hyperglycemia: Secondary | ICD-10-CM | POA: Diagnosis not present

## 2023-09-08 DIAGNOSIS — I700 Atherosclerosis of aorta: Secondary | ICD-10-CM | POA: Diagnosis not present

## 2023-09-08 DIAGNOSIS — E1165 Type 2 diabetes mellitus with hyperglycemia: Secondary | ICD-10-CM | POA: Diagnosis not present

## 2023-09-08 DIAGNOSIS — E7800 Pure hypercholesterolemia, unspecified: Secondary | ICD-10-CM | POA: Diagnosis not present

## 2023-09-08 DIAGNOSIS — G4700 Insomnia, unspecified: Secondary | ICD-10-CM | POA: Diagnosis not present

## 2023-09-08 DIAGNOSIS — Z8679 Personal history of other diseases of the circulatory system: Secondary | ICD-10-CM | POA: Diagnosis not present

## 2023-09-08 DIAGNOSIS — G250 Essential tremor: Secondary | ICD-10-CM | POA: Diagnosis not present

## 2023-09-12 ENCOUNTER — Other Ambulatory Visit: Payer: Self-pay

## 2023-09-12 ENCOUNTER — Encounter (HOSPITAL_COMMUNITY): Payer: Self-pay | Admitting: Emergency Medicine

## 2023-09-12 ENCOUNTER — Emergency Department (HOSPITAL_COMMUNITY)

## 2023-09-12 ENCOUNTER — Observation Stay (HOSPITAL_COMMUNITY)
Admission: EM | Admit: 2023-09-12 | Discharge: 2023-09-14 | Disposition: A | Discharge status: Home / Self Care | Attending: Student | Admitting: Student

## 2023-09-12 DIAGNOSIS — I9589 Other hypotension: Secondary | ICD-10-CM | POA: Diagnosis not present

## 2023-09-12 DIAGNOSIS — R000 Tachycardia, unspecified: Secondary | ICD-10-CM | POA: Diagnosis not present

## 2023-09-12 DIAGNOSIS — Z85828 Personal history of other malignant neoplasm of skin: Secondary | ICD-10-CM | POA: Diagnosis not present

## 2023-09-12 DIAGNOSIS — W19XXXA Unspecified fall, initial encounter: Secondary | ICD-10-CM | POA: Insufficient documentation

## 2023-09-12 DIAGNOSIS — Z794 Long term (current) use of insulin: Secondary | ICD-10-CM | POA: Insufficient documentation

## 2023-09-12 DIAGNOSIS — Z7985 Long-term (current) use of injectable non-insulin antidiabetic drugs: Secondary | ICD-10-CM | POA: Diagnosis not present

## 2023-09-12 DIAGNOSIS — Z79899 Other long term (current) drug therapy: Secondary | ICD-10-CM | POA: Diagnosis not present

## 2023-09-12 DIAGNOSIS — I471 Supraventricular tachycardia, unspecified: Principal | ICD-10-CM | POA: Insufficient documentation

## 2023-09-12 DIAGNOSIS — R42 Dizziness and giddiness: Secondary | ICD-10-CM | POA: Diagnosis not present

## 2023-09-12 DIAGNOSIS — E1165 Type 2 diabetes mellitus with hyperglycemia: Secondary | ICD-10-CM | POA: Diagnosis not present

## 2023-09-12 DIAGNOSIS — R7989 Other specified abnormal findings of blood chemistry: Secondary | ICD-10-CM | POA: Insufficient documentation

## 2023-09-12 DIAGNOSIS — Z043 Encounter for examination and observation following other accident: Secondary | ICD-10-CM | POA: Diagnosis not present

## 2023-09-12 DIAGNOSIS — E1140 Type 2 diabetes mellitus with diabetic neuropathy, unspecified: Secondary | ICD-10-CM | POA: Insufficient documentation

## 2023-09-12 DIAGNOSIS — R112 Nausea with vomiting, unspecified: Secondary | ICD-10-CM | POA: Diagnosis not present

## 2023-09-12 DIAGNOSIS — R0689 Other abnormalities of breathing: Secondary | ICD-10-CM | POA: Diagnosis not present

## 2023-09-12 DIAGNOSIS — Z87891 Personal history of nicotine dependence: Secondary | ICD-10-CM | POA: Insufficient documentation

## 2023-09-12 DIAGNOSIS — Y9289 Other specified places as the place of occurrence of the external cause: Secondary | ICD-10-CM | POA: Insufficient documentation

## 2023-09-12 DIAGNOSIS — R197 Diarrhea, unspecified: Secondary | ICD-10-CM

## 2023-09-12 DIAGNOSIS — R0781 Pleurodynia: Secondary | ICD-10-CM | POA: Diagnosis not present

## 2023-09-12 DIAGNOSIS — Z1152 Encounter for screening for COVID-19: Secondary | ICD-10-CM | POA: Diagnosis not present

## 2023-09-12 LAB — COMPREHENSIVE METABOLIC PANEL WITH GFR
ALT: 16 U/L (ref 0–44)
AST: 22 U/L (ref 15–41)
Albumin: 3.1 g/dL — ABNORMAL LOW (ref 3.5–5.0)
Alkaline Phosphatase: 59 U/L (ref 38–126)
Anion gap: 9 (ref 5–15)
BUN: 13 mg/dL (ref 8–23)
CO2: 22 mmol/L (ref 22–32)
Calcium: 8.1 mg/dL — ABNORMAL LOW (ref 8.9–10.3)
Chloride: 104 mmol/L (ref 98–111)
Creatinine, Ser: 0.9 mg/dL (ref 0.44–1.00)
GFR, Estimated: >60 mL/min (ref >60)
Glucose, Bld: 238 mg/dL — ABNORMAL HIGH (ref 70–99)
Potassium: 3.9 mmol/L (ref 3.5–5.1)
Sodium: 135 mmol/L (ref 135–145)
Total Bilirubin: 1.3 mg/dL — ABNORMAL HIGH (ref 0.0–1.2)
Total Protein: 6.5 g/dL (ref 6.5–8.1)

## 2023-09-12 LAB — LIPASE, BLOOD: Lipase: 29 U/L (ref 11–51)

## 2023-09-12 LAB — CBC WITH DIFFERENTIAL/PLATELET
Abs Immature Granulocytes: 0.07 10*3/uL (ref 0.00–0.07)
Basophils Absolute: 0 10*3/uL (ref 0.0–0.1)
Basophils Relative: 0 %
Eosinophils Absolute: 0.1 10*3/uL (ref 0.0–0.5)
Eosinophils Relative: 1 %
HCT: 38.8 % (ref 36.0–46.0)
Hemoglobin: 12.7 g/dL (ref 12.0–15.0)
Immature Granulocytes: 1 %
Lymphocytes Relative: 18 %
Lymphs Abs: 1.9 10*3/uL (ref 0.7–4.0)
MCH: 26.6 pg (ref 26.0–34.0)
MCHC: 32.7 g/dL (ref 30.0–36.0)
MCV: 81.3 fL (ref 80.0–100.0)
Monocytes Absolute: 0.6 10*3/uL (ref 0.1–1.0)
Monocytes Relative: 6 %
Neutro Abs: 7.7 10*3/uL (ref 1.7–7.7)
Neutrophils Relative %: 74 %
Platelets: 281 10*3/uL (ref 150–400)
RBC: 4.77 MIL/uL (ref 3.87–5.11)
RDW: 12.8 % (ref 11.5–15.5)
WBC: 10.4 10*3/uL (ref 4.0–10.5)
nRBC: 0 % (ref 0.0–0.2)

## 2023-09-12 LAB — MAGNESIUM: Magnesium: 1.4 mg/dL — ABNORMAL LOW (ref 1.7–2.4)

## 2023-09-12 LAB — TROPONIN I (HIGH SENSITIVITY): Troponin I (High Sensitivity): 18 ng/L — ABNORMAL HIGH (ref <18)

## 2023-09-12 MED ORDER — SODIUM CHLORIDE 0.9 % IV BOLUS
1000.0000 mL | Freq: Once | INTRAVENOUS | Status: AC
  Administered 2023-09-12: 1000 mL via INTRAVENOUS

## 2023-09-12 MED ORDER — FENTANYL CITRATE PF 50 MCG/ML IJ SOSY
50.0000 ug | PREFILLED_SYRINGE | Freq: Once | INTRAMUSCULAR | Status: AC
  Administered 2023-09-12: 50 ug via INTRAVENOUS
  Filled 2023-09-12: qty 1

## 2023-09-12 MED ORDER — METOCLOPRAMIDE HCL 5 MG/ML IJ SOLN
10.0000 mg | INTRAMUSCULAR | Status: AC
  Administered 2023-09-12: 10 mg via INTRAVENOUS
  Filled 2023-09-12: qty 2

## 2023-09-12 MED ORDER — MAGNESIUM SULFATE 2 GM/50ML IV SOLN
2.0000 g | Freq: Once | INTRAVENOUS | Status: AC
  Administered 2023-09-12: 2 g via INTRAVENOUS
  Filled 2023-09-12: qty 50

## 2023-09-12 MED ORDER — ADENOSINE 6 MG/2ML IV SOLN
6.0000 mg | Freq: Once | INTRAVENOUS | Status: AC
Start: 2023-09-12 — End: 2023-09-12
  Administered 2023-09-12: 6 mg via INTRAVENOUS
  Filled 2023-09-12: qty 2

## 2023-09-12 NOTE — ED Triage Notes (Signed)
 Pt to ED via [facility] from home c/o n/v/d since yesterday, two episodes today with EMS.  Per EMS syncopal episode today while getting up from chair and became dizzy, fall hitting right chest with pain, denies thinners.  States unable to take meds d/t vomiting.  For EMS pt HR 160-170bpm, given 4mg  zofran  and 500 NS.

## 2023-09-12 NOTE — ED Provider Notes (Signed)
 [place] [facility] AT [facility] Provider Note   CSN: [id number] Arrival date & time: 09/12/23  2116     History {Add pertinent medical, surgical, social history, OB history to HPI:1} Chief Complaint  Patient presents with   Vomiting    Laura Gentry is a 68 y.o. female.  The history is provided by the patient and medical records.   68 y.o. F with hx of anemia, HTN, HLD, anemia, presenting to the ED for N/V/D since yesterday.  States she was up half the night in the bathroom and overall just feels "awful".  Apparently today became more weak and had a fall while getting up from a chair.  Admits to feeling lightheaded prior to this, thinks she may have passed out.  She fell onto her right side striking right chest wall.  Did bump her head.  States she just has ongoing nausea and generally feeling unwell.  Has had palpitations on and off today.  No real chest pain aside from right ribs.  No fevers.  No family with similar.  No sick contacts.  Did eat a new restaurant Friday night so unsure if that has anything to do with it or not.  No abdominal pain.  Patient's HR around 160 on arrival.  Did not have her home BP meds today due to vomiting.  Zofran  and 500cc bolus en route with EMS.    Home Medications Prior to Admission medications   Medication Sig Start Date End Date Taking? Authorizing Provider  acetaminophen  (TYLENOL ) 500 MG tablet Take 1 tablet (500 mg total) by mouth every 6 (six) hours as needed for mild pain or headache. 12/01/21   Armenta Landau, MD  atorvastatin  (LIPITOR) 40 MG tablet Take 1 tablet (40 mg total) by mouth daily. 01/31/18   Amin, Sumayya, MD  Blood Glucose Monitoring Suppl (ACCU-CHEK AVIVA PLUS) w/Device KIT The patient IS insulin  requiring, ICD 10 code E11.9. The patient tests 4 times per day. 06/04/17   Kenny Peals, MD  DULoxetine  (CYMBALTA ) 60 MG capsule Take 60 mg by mouth daily.    [provider]  ferrous sulfate  325  (65 FE) MG tablet Take 325 mg by mouth daily with breakfast.    [provider]  gabapentin  (NEURONTIN ) 300 MG capsule Take 300 mg in AM, 300 mg at noon, 600 mg at bedtime Patient taking differently: Take 300-600 mg by mouth See admin instructions. Take 300 mg by mouth in the morning, 300 mg at noon and 600 mg at bedtime 09/21/16   Rivet, Carly J, MD  glucose blood (ACCU-CHEK AVIVA PLUS) test strip Use as instructed. The patient IS insulin  requiring, ICD 10 code E11.9. The patient tests 4 times per day. 09/23/16   Rivet, Renae Carney, MD  HYDROcodone -acetaminophen  (NORCO/VICODIN) 5-325 MG tablet Take 1 tablet by mouth every 6 (six) hours as needed for moderate pain. 03/23/22   Wilhelmenia Harada, MD  indapamide (LOZOL) 1.25 MG tablet Take 1.25 mg by mouth daily. 01/13/22   [provider]  insulin  aspart (NOVOLOG ) 100 UNIT/ML FlexPen Inject Before each meal 3 times a day, 140-199 - 2 units, 200-250 - 4 units, 251-299 - 6 units,  300-349 - 8 units,  350 or above 10 units. 11/24/21   Singh, Prashant K, MD  insulin  glargine (LANTUS  SOLOSTAR) 100 UNIT/ML Solostar Pen Inject 32 Units into the skin at bedtime. 11/24/21   Singh, Prashant K, MD  Insulin  Pen Needle 32G X 4 MM MISC Use to  inject lantus  once dialy 10/29/17   Amin, Sumayya, MD  Lancets (ACCU-CHEK SOFT TOUCH) lancets Use as instructed 09/23/16   Rivet, Renae Carney, MD  midodrine  (PROAMATINE ) 5 MG tablet Take 1 tablet (5 mg total) by mouth 3 (three) times daily with meals. 12/01/21   Armenta Landau, MD  ondansetron  (ZOFRAN ) 4 MG tablet Take 1 tablet (4 mg total) by mouth every 8 (eight) hours as needed for nausea or vomiting. 01/22/22   Singh, Prashant K, MD  pantoprazole  (PROTONIX ) 40 MG tablet Take 1 tablet (40 mg total) by mouth 2 (two) times daily before a meal. 01/22/22 02/21/22  Cala Castleman, MD  propranolol  (INDERAL ) 20 MG tablet Take 1 tablet (20 mg total) by mouth 3 (three) times daily. 12/01/21   Armenta Landau, MD  sucralfate  (CARAFATE )  1 GM/10ML suspension Take 10 mLs (1 g total) by mouth 4 (four) times daily -  with meals and at bedtime. 01/22/22   Singh, Prashant K, MD  thiamine  (VITAMIN B-1) 100 MG tablet Take 100 mg by mouth daily.    [provider]  vitamin B-12 (CYANOCOBALAMIN ) 1000 MCG tablet Take 1,000 mcg by mouth daily.    [provider]      Allergies    Amoxicillin, Bee venom, Penicillins, Pneumococcal vaccines, Sulfa  antibiotics, and Bactrim  [sulfamethoxazole -trimethoprim ]    Review of Systems   Review of Systems  Cardiovascular:  Positive for chest pain (R ribs).  Gastrointestinal:  Positive for diarrhea, nausea and vomiting.  All other systems reviewed and are negative.   Physical Exam Updated Vital Signs BP 98/69 (BP Location: Left Arm)   Pulse (!) 167   Temp 98.2 F (36.8 C) (Oral)   Resp 20   Ht 5\' 7"  (1.702 m)   Wt 73 kg   SpO2 96%   BMI 25.22 kg/m   Physical Exam Vitals and nursing note reviewed.  Constitutional:      Appearance: She is well-developed.  HENT:     Head: Normocephalic and atraumatic.  Eyes:     Conjunctiva/sclera: Conjunctivae normal.     Pupils: Pupils are equal, round, and reactive to light.  Cardiovascular:     Rate and Rhythm: Regular rhythm. Tachycardia present.     Heart sounds: Normal heart sounds.     Comments: 150-160 during exam Pulmonary:     Effort: Pulmonary effort is normal.     Breath sounds: Normal breath sounds.     Comments: Lungs CTAB, speaking in full sentences without difficulty Chest:     Comments: Right chest wall is TTP just beneath the breast, there is no large deformity or bruising present Abdominal:     General: Bowel sounds are normal.     Palpations: Abdomen is soft.  Musculoskeletal:        General: Normal range of motion.     Cervical back: Normal range of motion.  Skin:    General: Skin is warm and dry.  Neurological:     Mental Status: She is alert and oriented to person, place, and time.     ED Results  / Procedures / Treatments   Labs (all labs ordered are listed, but only abnormal results are displayed) Labs Reviewed  COMPREHENSIVE METABOLIC PANEL WITH GFR - Abnormal; Notable for the following components:      Result Value   Glucose, Bld 238 (*)    Calcium  8.1 (*)    Albumin 3.1 (*)    Total Bilirubin 1.3 (*)    All  other components within normal limits  MAGNESIUM  - Abnormal; Notable for the following components:   Magnesium  1.4 (*)    All other components within normal limits  TROPONIN I (HIGH SENSITIVITY) - Abnormal; Notable for the following components:   Troponin I (High Sensitivity) 18 (*)    All other components within normal limits  CBC WITH DIFFERENTIAL/PLATELET  LIPASE, BLOOD  TROPONIN I (HIGH SENSITIVITY)    EKG None  Radiology DG Ribs Unilateral W/Chest Right Result Date: 09/12/2023 CLINICAL DATA:  Status post fall with subsequent right-sided pain. EXAM: RIGHT RIBS AND CHEST - 3+ VIEW COMPARISON:  None Available. FINDINGS: A radiopaque marker is seen at the site of the patient's pain. No fracture or other bone lesions are seen involving the ribs. There is no evidence of pneumothorax or pleural effusion. Mild atelectatic changes are seen within the bilateral lung bases. Heart size and mediastinal contours are within normal limits. IMPRESSION: 1. No acute osseous abnormality. Electronically Signed   By: Virgle Grime M.D.   On: 09/12/2023 23:33    Procedures Procedures  {Document cardiac monitor, telemetry assessment procedure when appropriate:1}  CRITICAL CARE Performed by: Coretha Dew   Total critical care time: 45 minutes  Critical care time was exclusive of separately billable procedures and treating other patients.  Critical care was necessary to treat or prevent imminent or life-threatening deterioration.  Critical care was time spent personally by me on the following activities: development of treatment plan with patient and/or surrogate as well  as nursing, discussions with consultants, evaluation of patient's response to treatment, examination of patient, obtaining history from patient or surrogate, ordering and performing treatments and interventions, ordering and review of laboratory studies, ordering and review of radiographic studies, pulse oximetry and re-evaluation of patient's condition.   Medications Ordered in ED Medications  magnesium  sulfate IVPB 2 g 50 mL (2 g Intravenous New Bag/Given 09/12/23 2315)  sodium chloride  0.9 % bolus 1,000 mL (1,000 mLs Intravenous New Bag/Given 09/12/23 2221)  metoCLOPramide  (REGLAN ) injection 10 mg (10 mg Intravenous Given 09/12/23 2221)  fentaNYL  (SUBLIMAZE ) injection 50 mcg (50 mcg Intravenous Given 09/12/23 2311)  adenosine  (ADENOCARD ) 6 MG/2ML injection 6 mg (6 mg Intravenous Given 09/12/23 2309)    ED Course/ Medical Decision Making/ A&P   {   Click here for ABCD2, HEART and other calculatorsREFRESH Note before signing :1}                              Medical Decision Making Amount and/or Complexity of Data Reviewed Labs: ordered. Radiology: ordered.  Risk Prescription drug management.   68 y.o. F here with N/V/D.  Ongoing since yesterday but feeling worse overall today with fatigue/lightheadedness.  Did have a fall at home and struck right ribs.  Some pain there but no abdominal pain, fever, chills, etc.  Afebrile, non-toxic in appearance here.  Abdomen soft, non-tender.  Some tenderness along right lower ribs.  She is very tachy 160's, seems to be SVT.  She has not had her BBlocker today due to N/V.  Suspect this is may be contributing along with some dehydration.   Will check labs, rib films.  IVF infusing.  Labs as above--no leukocytosis.  Magnesium  is low at 1.4 but she has no significant other electrolyte derangement.  Normal lipase.  Troponin 18, suspect this is likely rate related.  She is not having any active chest pain aside from her right rib.  Lower suspicion  for  ACS.  11:13 PM HR not improving with IVF.  Elected to cardiovert with adenosine .  Converted to sinus tach after 6mg .  Patient tolerated well.  Repeat EKG obtained.  Repleting Mg+ now.  Will observe to ensure stays sinus and repeat troponin.  Final Clinical Impression(s) / ED Diagnoses Final diagnoses:  None    Rx / DC Orders ED Discharge Orders     None

## 2023-09-13 ENCOUNTER — Observation Stay (HOSPITAL_BASED_OUTPATIENT_CLINIC_OR_DEPARTMENT_OTHER)

## 2023-09-13 DIAGNOSIS — R7989 Other specified abnormal findings of blood chemistry: Secondary | ICD-10-CM

## 2023-09-13 DIAGNOSIS — I471 Supraventricular tachycardia, unspecified: Secondary | ICD-10-CM | POA: Diagnosis not present

## 2023-09-13 DIAGNOSIS — R9431 Abnormal electrocardiogram [ECG] [EKG]: Secondary | ICD-10-CM

## 2023-09-13 LAB — ECHOCARDIOGRAM COMPLETE
AR max vel: 2.14 cm2
AV Peak grad: 6.3 mmHg
Ao pk vel: 1.25 m/s
Area-P 1/2: 3.72 cm2
Height: 67 in
S' Lateral: 3 cm
Weight: 2576 [oz_av]

## 2023-09-13 LAB — HEMOGLOBIN A1C
Hgb A1c MFr Bld: 10.5 % — ABNORMAL HIGH (ref 4.8–5.6)
Mean Plasma Glucose: 254.65 mg/dL

## 2023-09-13 LAB — TROPONIN I (HIGH SENSITIVITY)
Troponin I (High Sensitivity): 67 ng/L — ABNORMAL HIGH (ref <18)
Troponin I (High Sensitivity): 118 ng/L (ref <18)
Troponin I (High Sensitivity): 103 ng/L (ref <18)

## 2023-09-13 LAB — GLUCOSE, CAPILLARY
Glucose-Capillary: 77 mg/dL (ref 70–99)
Glucose-Capillary: 193 mg/dL — ABNORMAL HIGH (ref 70–99)

## 2023-09-13 LAB — RESP PANEL BY RT-PCR (RSV, FLU A&B, COVID)  RVPGX2
Influenza A by PCR: NEGATIVE
Influenza B by PCR: NEGATIVE
Resp Syncytial Virus by PCR: NEGATIVE
SARS Coronavirus 2 by RT PCR: NEGATIVE

## 2023-09-13 LAB — TSH: TSH: 1.616 u[IU]/mL (ref 0.350–4.500)

## 2023-09-13 LAB — MAGNESIUM: Magnesium: 2 mg/dL (ref 1.7–2.4)

## 2023-09-13 LAB — CBG MONITORING, ED: Glucose-Capillary: 89 mg/dL (ref 70–99)

## 2023-09-13 MED ORDER — ZOLPIDEM TARTRATE 5 MG PO TABS
5.0000 mg | ORAL_TABLET | Freq: Every evening | ORAL | Status: DC | PRN
  Administered 2023-09-13: 5 mg via ORAL
  Filled 2023-09-13: qty 1

## 2023-09-13 MED ORDER — MIDODRINE HCL 5 MG PO TABS
5.0000 mg | ORAL_TABLET | Freq: Two times a day (BID) | ORAL | Status: DC
  Administered 2023-09-13: 5 mg via ORAL
  Filled 2023-09-13: qty 1

## 2023-09-13 MED ORDER — GABAPENTIN 300 MG PO CAPS
300.0000 mg | ORAL_CAPSULE | ORAL | Status: DC
  Administered 2023-09-13 – 2023-09-14 (×3): 300 mg via ORAL
  Filled 2023-09-13 (×3): qty 1

## 2023-09-13 MED ORDER — OXYCODONE-ACETAMINOPHEN 5-325 MG PO TABS
1.0000 | ORAL_TABLET | ORAL | Status: DC | PRN
  Administered 2023-09-13 – 2023-09-14 (×3): 1 via ORAL
  Filled 2023-09-13 (×3): qty 1

## 2023-09-13 MED ORDER — SODIUM CHLORIDE 0.9% FLUSH
3.0000 mL | Freq: Two times a day (BID) | INTRAVENOUS | Status: DC
Start: 2023-09-13 — End: 2023-09-14
  Administered 2023-09-13: 3 mL via INTRAVENOUS

## 2023-09-13 MED ORDER — OXYCODONE-ACETAMINOPHEN 5-325 MG PO TABS
1.0000 | ORAL_TABLET | ORAL | Status: AC | PRN
  Administered 2023-09-13 (×3): 1 via ORAL
  Filled 2023-09-13 (×3): qty 1

## 2023-09-13 MED ORDER — PROPRANOLOL HCL 10 MG PO TABS
5.0000 mg | ORAL_TABLET | Freq: Three times a day (TID) | ORAL | Status: DC
  Administered 2023-09-13 – 2023-09-14 (×3): 5 mg via ORAL
  Filled 2023-09-13 (×2): qty 0.5
  Filled 2023-09-13: qty 1
  Filled 2023-09-13 (×2): qty 0.5

## 2023-09-13 MED ORDER — ENOXAPARIN SODIUM 40 MG/0.4ML IJ SOSY
40.0000 mg | PREFILLED_SYRINGE | INTRAMUSCULAR | Status: DC
  Administered 2023-09-13 – 2023-09-14 (×2): 40 mg via SUBCUTANEOUS
  Filled 2023-09-13 (×2): qty 0.4

## 2023-09-13 MED ORDER — INSULIN ASPART 100 UNIT/ML IJ SOLN
0.0000 [IU] | Freq: Three times a day (TID) | INTRAMUSCULAR | Status: DC
  Administered 2023-09-14: 2 [IU] via SUBCUTANEOUS

## 2023-09-13 MED ORDER — IBUPROFEN 200 MG PO TABS: 400.0000 mg | ORAL_TABLET | Freq: Four times a day (QID) | ORAL | Status: DC | PRN

## 2023-09-13 MED ORDER — SERTRALINE HCL 25 MG PO TABS
25.0000 mg | ORAL_TABLET | Freq: Every day | ORAL | Status: DC
  Administered 2023-09-13: 25 mg via ORAL
  Filled 2023-09-13: qty 1

## 2023-09-13 MED ORDER — PANTOPRAZOLE SODIUM 40 MG PO TBEC
40.0000 mg | DELAYED_RELEASE_TABLET | Freq: Every day | ORAL | Status: DC
  Administered 2023-09-13 – 2023-09-14 (×2): 40 mg via ORAL
  Filled 2023-09-13 (×2): qty 1

## 2023-09-13 MED ORDER — INSULIN ASPART 100 UNIT/ML IJ SOLN: 0.0000 [IU] | Freq: Every day | INTRAMUSCULAR | Status: DC

## 2023-09-13 MED ORDER — SODIUM CHLORIDE 0.9 % IV SOLN: INTRAVENOUS | Status: DC

## 2023-09-13 MED ORDER — GABAPENTIN 300 MG PO CAPS
600.0000 mg | ORAL_CAPSULE | Freq: Every day | ORAL | Status: DC
  Administered 2023-09-13: 600 mg via ORAL
  Filled 2023-09-13: qty 2

## 2023-09-13 MED ORDER — PROPRANOLOL HCL 10 MG PO TABS
10.0000 mg | ORAL_TABLET | Freq: Three times a day (TID) | ORAL | Status: DC
Start: 2023-09-13 — End: 2023-09-13

## 2023-09-13 MED ORDER — GABAPENTIN 300 MG PO CAPS: 900.0000 mg | ORAL_CAPSULE | Freq: Every day | ORAL | Status: DC

## 2023-09-13 NOTE — H&P (Addendum)
 History and Physical    Patient: Laura Gentry WUJ:[id number] DOB: 02-14-1956 DOA: 09/12/2023 DOS: the patient was seen and examined on 09/13/2023 PCP: Merl Star, MD  Patient coming from: Home  Chief Complaint:  Chief Complaint  Patient presents with   Vomiting   HPI: Laura Gentry is a 68 y.o. female with medical history significant of intracranial aneurysm status post clipping in 2014, diabetes, dyslipidemia, renal calculi, and thyroid  disease.  Prior to presentation patient had had nausea vomiting and diarrhea for several days.  She became dizzy and had a syncopal episode while getting up from the chair.  She fell and hit her right chest.  She was reporting right-sided chest discomfort.  Upon EMS arrival patient heart rate was in the 160s to 170s.  She was not in atrial fibrillation.  She was given a 500 cc normal saline bolus as well as Zofran  by EMS.  She had not had any abdominal pain or bleeding with the diarrhea.  She was given an additional 1 L of normal saline but heart rate did not improve therefore was given 6 mg of adenosine  with conversion back to a sinus rhythm.  He spoke with cardiologist on-call Dr. Teofilo Fellers who recommended overnight observation, trend troponins and obtain an echocardiogram.  Hospital service has accepted the patient for admission.  Of note x-rays of the ribs revealed no evidence of rib fractures.   Review of Systems: As mentioned in the history of present illness. All other systems reviewed and are negative. Past Medical History:  Diagnosis Date   Brain aneurysm 03/19/2013   Cataracts, bilateral    Cerebral aneurysm    Colon polyps    Diabetes (HCC) 2013   High cholesterol    History of kidney stones    Insomnia    Lumbosacral radiculopathy    Osteoporosis    Skin cancer    squamous   Thyroid  disease    Tremor    Past Surgical History:  Procedure Laterality Date   APPENDECTOMY     BRAIN SURGERY  03/19/2013   Sugita aneyrsum clip  can have MRI up to 4T docs scanned in    BREAST BIOPSY Right    CHOLECYSTECTOMY  2013   CYSTOSCOPY WITH RETROGRADE PYELOGRAM, URETEROSCOPY AND STENT PLACEMENT Bilateral 10/29/2021   Procedure: CYSTOSCOPY WITH RETROGRADE PYELOGRAM, URETEROSCOPY AND STENT PLACEMENT;  Surgeon: Osborn Blaze, MD;  Location: [facility];  Service: Urology;  Laterality: Bilateral;   CYSTOSCOPY WITH RETROGRADE PYELOGRAM, URETEROSCOPY AND STENT PLACEMENT Bilateral 11/19/2021   Procedure: CYSTOSCOPY WITH RETROGRADE PYELOGRAM, URETEROSCOPY AND STENT EXCHANGE, BASKET STONE REMOVAL;  Surgeon: Osborn Blaze, MD;  Location: [facility];  Service: Urology;  Laterality: Bilateral;  75 MINS   HIP PINNING,CANNULATED Left 11/04/2021   Procedure: CANNULATED HIP PINNING;  Surgeon: Wilhelmenia Harada, MD;  Location: [facility];  Service: Orthopedics;  Laterality: Left;   HOLMIUM LASER APPLICATION Bilateral 11/19/2021   Procedure: HOLMIUM LASER APPLICATION;  Surgeon: Osborn Blaze, MD;  Location: [facility];  Service: Urology;  Laterality: Bilateral;   INNER EAR SURGERY     Lost Hearing   TONSILLECTOMY AND ADENOIDECTOMY     VAGINAL HYSTERECTOMY     At age 52 due to "growth"   Social History:  reports that she quit smoking about 12 years ago. Her smoking use included cigarettes. She started smoking about 52 years ago. She has a 20 pack-year smoking history. She has never used smokeless tobacco. She reports that she does not drink alcohol and does not use  drugs.  Allergies  Allergen Reactions   Amoxicillin Anaphylaxis and Shortness Of Breath   Bee Venom Shortness Of Breath   Penicillins Anaphylaxis, Swelling and Other (See Comments)    Remote reaction- Throat swelling - compromised airway Tolerates ceftriaxone    Pneumococcal Vaccines Other (See Comments)    Per patient due to Brain Aneurysm   Sulfa  Antibiotics Hives, Itching and Swelling   Bactrim  [Sulfamethoxazole -Trimethoprim ] Hives, Itching, Swelling and Other (See Comments)    Felt off  balance    Family History  Problem Relation Age of Onset   Asthma Mother    Allergies Mother    Diabetes Mother    Lung cancer Father        smoked   Throat cancer Father        smoked   Pancreatic cancer Brother    Hypertension Maternal Grandfather    Diabetes Maternal Grandfather    Emphysema Maternal Aunt        never smoked, spouse did   Breast cancer Maternal Aunt    Breast cancer Maternal Aunt    Breast cancer Paternal Aunt     Prior to Admission medications   Medication Sig Start Date End Date Taking? Authorizing Provider  acetaminophen  (TYLENOL ) 500 MG tablet Take 1 tablet (500 mg total) by mouth every 6 (six) hours as needed for mild pain or headache. 12/01/21  Yes Armenta Landau, MD  alendronate  (FOSAMAX ) 70 MG tablet Take 70 mg by mouth once a week. 06/16/23  Yes [provider]  atorvastatin  (LIPITOR) 40 MG tablet Take 1 tablet (40 mg total) by mouth daily. Patient taking differently: Take 40 mg by mouth at bedtime. 01/31/18  Yes Luna Salinas, MD  gabapentin  (NEURONTIN ) 300 MG capsule Take 300 mg in AM, 300 mg at noon, 600 mg at bedtime Patient taking differently: Take 900 mg by mouth at bedtime. 09/21/16  Yes Rivet, Renae Carney, MD  insulin  aspart (NOVOLOG ) 100 UNIT/ML injection Inject 6-24 Units into the skin 3 (three) times daily before meals. Sliding scale: 100-140   = 6 units 141-180  =  10 units 181-220  =  12 units 221-260  =  14 units 261-300  =  16 units 301-340  =  18 units 341-380  =  20 units 381 > = 24 units   Yes [provider]  insulin  glargine (LANTUS  SOLOSTAR) 100 UNIT/ML Solostar Pen Inject 32 Units into the skin at bedtime. Patient taking differently: Inject 45 Units into the skin at bedtime. 11/24/21  Yes Singh, Prashant K, MD  midodrine  (PROAMATINE ) 5 MG tablet Take 1 tablet (5 mg total) by mouth 3 (three) times daily with meals. Patient taking differently: Take 5 mg by mouth 2 (two) times daily with a meal. 12/01/21  Yes  Armenta Landau, MD  propranolol  (INDERAL ) 10 MG tablet Take 10 mg by mouth 3 (three) times daily.   Yes [provider]  Semaglutide,0.25 or 0.5MG /DOS, (OZEMPIC, 0.25 OR 0.5 MG/DOSE,) 2 MG/3ML SOPN Inject 0.5 mg into the skin once a week. Inject on Monday 10/15/22  Yes [provider]  sertraline  (ZOLOFT ) 25 MG tablet Take 25 mg by mouth at bedtime. 07/14/23  Yes [provider]  thiamine  (VITAMIN B-1) 100 MG tablet Take 100 mg by mouth daily.   Yes [provider]  vitamin B-12 (CYANOCOBALAMIN ) 1000 MCG tablet Take 1,000 mcg by mouth daily.   Yes [provider]  zolpidem  (AMBIEN ) 5 MG tablet Take 5 mg by mouth at  bedtime as needed. 09/08/23  Yes [provider]    Physical Exam: Vitals:   09/13/23 0700 09/13/23 0830 09/13/23 1005 09/13/23 1025  BP: 107/63 111/66  94/60  Pulse: 75 74  72  Resp: 14 18  11   Temp:   98 F (36.7 C)   TempSrc:   Oral   SpO2: 97% 96%  93%  Weight:      Height:       Constitutional: NAD, calm, uncomfortable secondary to ongoing right upper rib cage discomfort Respiratory: clear to auscultation bilaterally, no wheezing, no crackles. Normal respiratory effort. No accessory muscle use.  Room air Cardiovascular: Regular rate and rhythm, no murmurs / rubs / gallops. No extremity edema. 2+ pedal pulses.   Abdomen: no tenderness, no masses palpated. No hepatosplenomegaly. Bowel sounds positive.  Musculoskeletal: no clubbing / cyanosis. No joint deformity upper and lower extremities. Good ROM, no contractures. Normal muscle tone.  Tender over right anterior rib cage next to the sternum Skin: no rashes, lesions, ulcers. No induration Neurologic: CN 2-12 grossly intact. Sensation intact, DTR normal. Strength 5/5 x all 4 extremities.  Psychiatric: Normal judgment and insight. Alert and oriented x 3. Normal mood.     Data Reviewed:  Initial labs at presentation: Sodium 135, potassium 3.9, chloride 104, CO2 22,  glucose 238, BUN 13, creatinine 0.9, magnesium  1.4, LFTs normal except for mildly elevated total bilirubin at 1.4  Troponin 18-67-118  WBC 10,400 with normal differential, hemoglobin 12.7, platelets 281,000  Hemoglobin A1c 10.5, TSH 1.616  Viral PCR for COVID, influenza and RSV negative  GI pathogen panel pending collection  Assessment and Plan: SVT Received 1.5 L normal saline bolus without improvement in tachycardia therefore was given adenosine  6 mg with conversion back to sinus rhythm Currently maintaining heart rate between 80 and 90 bpm Likely needs additional volume replacement-see below Initial potassium 3.9 therefore will give potassium 40 mill equivalents now.  Need to keep K greater than or equal to 4  Chronic hypotension Resume home midodrine   Fall at home Currently reporting right anterior rib cage pain.  Chest x-ray negative for rib fracture Suspect rib contusion noting there is no bruising overlying the rib cage As needed ibuprofen  PT consultation  Elevated troponin Troponin: 18-67-118 Likely demand ischemia secondary to decreased perfusion from persistent high rate tachycardia Echocardiogram pending EKG nonischemic  Hypomagnesemia EDP replaced with IV magnesium  Repeat magnesium  in a.m.  Recent nausea vomiting and diarrhea No evidence of flu or COVID Pathogen panel pending collection No diarrhea since arrival Maintenance IV fluid at 100 cc/h Push oral intake of fluids  Diabetes mellitus 2 with hyperglycemia Follow CBGs and provide SSI Hemoglobin A1c 10.4 which demonstrates poorly controlled diabetes At home patient was on sliding scale insulin  3 times daily before meals For now we will hold home Lantus  45 units daily at bedtime  Diabetic peripheral neuropathy Continue home Neurontin   History of migraine We will resume Inderal  but decrease dose for now.  She can reexplore with her PCP increasing dose if blood pressure improved    Advance Care  Planning:   Code Status: Full Code   VTE prophylaxis: Lovenox   Consults: None  Family Communication: Family at bedside  Severity of Illness: The appropriate patient status for this patient is OBSERVATION. Observation status is judged to be reasonable and necessary in order to provide the required intensity of service to ensure the patient's safety. The patient's presenting symptoms, physical exam findings, and initial radiographic and laboratory data in the  context of their medical condition is felt to place them at decreased risk for further clinical deterioration. Furthermore, it is anticipated that the patient will be medically stable for discharge from the hospital within 2 midnights of admission.   Author: Kathye Parkin, NP 09/13/2023 10:49 AM  For on call review [email or web address].

## 2023-09-13 NOTE — Progress Notes (Signed)
 Echocardiogram 2D Echocardiogram has been performed.  Laura Gentry 09/13/2023, 5:18 PM

## 2023-09-13 NOTE — Inpatient Diabetes Management (Signed)
 Inpatient Diabetes Program Recommendations  AACE/ADA: New Consensus Statement on Inpatient Glycemic Control (2015)  Target Ranges:  Prepandial:   less than 140 mg/dL      Peak postprandial:   less than 180 mg/dL (1-2 hours)      Critically ill patients:  140 - 180 mg/dL   Lab Results  Component Value Date   GLUCAP 89 09/13/2023   HGBA1C 10.5 (H) 09/13/2023    Review of Glycemic Control  Latest Reference Range & Units 09/13/23 07:44  Glucose-Capillary 70 - 99 mg/dL 89   Diabetes history: DM 2 Outpatient Diabetes medications: Lantus  55 units (sometimes takes 45 to keep from going low), Novolog  6-24 units tid, Ozempic 0.5 mg Weekly Current orders for Inpatient glycemic control:  Novolog  0-9 units tid + hs  Endocrinologist: Roylene Corn,  MD. Next appointment May 1st (2nd time seeing her)  Spoke with pt at bedside. She has an appointment with her Endocrinologist soon, She reports at her initial visit her long acting insulin  was adjusted, she was placed on a sliding scale, and she reports learning how to take Ozempic appropriately (reported dialing up 5 clicks for ozempic instead of 0.5 mg and was never taking it appropriately). Pt reports compliance with her diet and being physically active. Discussed current A1c level of 10.5%. pt reports her glucose trends overall by her CGM have been improved. Reviewed glucose and A1c goals. Pt has no other needs at this time.   Will watch trends at this time and pt to follow up with her Endocrinologist.  Thanks,  Eloise Hake RN, MSN, BC-ADM Inpatient Diabetes Coordinator Team Pager [phone] (8a-5p)

## 2023-09-13 NOTE — ED Notes (Signed)
 Phleb at bedside

## 2023-09-14 DIAGNOSIS — I471 Supraventricular tachycardia, unspecified: Secondary | ICD-10-CM | POA: Diagnosis not present

## 2023-09-14 LAB — BASIC METABOLIC PANEL WITH GFR
Anion gap: 9 (ref 5–15)
BUN: 10 mg/dL (ref 8–23)
CO2: 19 mmol/L — ABNORMAL LOW (ref 22–32)
Calcium: 7.7 mg/dL — ABNORMAL LOW (ref 8.9–10.3)
Chloride: 109 mmol/L (ref 98–111)
Creatinine, Ser: 0.71 mg/dL (ref 0.44–1.00)
GFR, Estimated: >60 mL/min (ref >60)
Glucose, Bld: 106 mg/dL — ABNORMAL HIGH (ref 70–99)
Potassium: 4.3 mmol/L (ref 3.5–5.1)
Sodium: 137 mmol/L (ref 135–145)

## 2023-09-14 LAB — GLUCOSE, CAPILLARY
Glucose-Capillary: 116 mg/dL — ABNORMAL HIGH (ref 70–99)
Glucose-Capillary: 189 mg/dL — ABNORMAL HIGH (ref 70–99)

## 2023-09-14 LAB — MAGNESIUM: Magnesium: 1.8 mg/dL (ref 1.7–2.4)

## 2023-09-14 NOTE — Consult Note (Signed)
 Cardiology Consultation   Patient ID: Laura Gentry MRN: [id number]; DOB: Nov 23, 1955  Admit date: 09/12/2023 Date of Consult: 09/14/2023  PCP:  Merl Star, MD   [facility] Providers Cardiologist:  None     Patient Profile:   Laura Gentry is a 68 y.o. female with a hx of intracranial aneurysm s/p clipping 2014, diabetes, hyperlipidemia, thyroid  disease, history of orthostatic hypotension who is being seen 09/14/2023 for the evaluation of abnormal echocardiogram at the request of Dr. Hubert Madden.  History of Present Illness:   Laura Gentry is a 68 year old female with above medical history.  Per chart review, does not appear patient has any cardiac history and does not follow with a cardiologist.  Patient presented to the ED on 4/20 complaining of nausea, vomiting, diarrhea since the day before.  Reported having a syncopal episode earlier that day while getting up from a chair.  With EMS, heart rate was in the 160s-170s.  Was given Zofran  and 50 cc bolus with EMS. Initial EKG in the ED showed SVT, heart rate 166 bpm.  Heart rate failed to improve with IV fluids.  She was given 6 mg adenosine  with conversion to sinus tachycardia.  Patient was admitted overnight for observation.  Labs in the ED significant for K3.9, mag 1.4, creatinine 0.9, WBC 10.4, hemoglobin 12.7, platelets 281.  TSH within normal limits.  High-sensitivity troponin 18, 67, 118, 103.  COVID, flu, RSV negative.  Patient underwent echocardiogram on 4/21 that showed EF 55-60%, regional wall motion abnormalities with possible basal anterolateral hypokinesis, grade 1 DD, normal RV systolic function, no significant valvular abnormalities.  Because of regional wall motion abnormalities on echo, cardiology consulted.  On interview, patient denies having any past cardiac history.  Denies family history of cardiac issues.  She used to smoke cigarettes, quit about 10 years ago.  Has never seen a cardiologist.  She has  chronically low blood pressure and takes midodrine .  Also has a history of tremor for which she takes propranolol .  Reports that on 4/18, she started to have significant nausea, vomiting, diarrhea.  Thought that she had possible food poisoning.  On 4/20, she noted that she was unable to keep her medications down.  She tried to take her home medications, but vomited them right back up.  She had an episode where she tried to stand up from a chair and became dizzy.  She fell into a wall and hit the right side of her ribs.  She thinks she may have lost consciousness but is not 100% sure.  She also became aware that her heart was beating very fast in her chest.  Felt palpitations, mild pressure and tightness in her chest.  Also felt like she was breathing a little faster.  In the ED, she was given adenosine  and her heart rate improved.  She has been feeling well since then.  Her GI symptoms have been improving.  Patient denies ever having issues with chest pain in the past.  Prior to admission, she was able to run up and down stairs, do chair yoga, walk outside, and do all activities of daily living without any chest pain or shortness of breath.  Past Medical History:  Diagnosis Date   Brain aneurysm 03/19/2013   Cataracts, bilateral    Cerebral aneurysm    Colon polyps    Diabetes (HCC) 2013   High cholesterol    History of kidney stones    Insomnia    Lumbosacral radiculopathy  Osteoporosis    Skin cancer    squamous   Thyroid  disease    Tremor     Past Surgical History:  Procedure Laterality Date   APPENDECTOMY     BRAIN SURGERY  03/19/2013   Sugita aneyrsum clip can have MRI up to 4T docs scanned in    BREAST BIOPSY Right    CHOLECYSTECTOMY  2013   CYSTOSCOPY WITH RETROGRADE PYELOGRAM, URETEROSCOPY AND STENT PLACEMENT Bilateral 10/29/2021   Procedure: CYSTOSCOPY WITH RETROGRADE PYELOGRAM, URETEROSCOPY AND STENT PLACEMENT;  Surgeon: Osborn Blaze, MD;  Location: [facility];  Service:  Urology;  Laterality: Bilateral;   CYSTOSCOPY WITH RETROGRADE PYELOGRAM, URETEROSCOPY AND STENT PLACEMENT Bilateral 11/19/2021   Procedure: CYSTOSCOPY WITH RETROGRADE PYELOGRAM, URETEROSCOPY AND STENT EXCHANGE, BASKET STONE REMOVAL;  Surgeon: Osborn Blaze, MD;  Location: [facility];  Service: Urology;  Laterality: Bilateral;  75 MINS   HIP PINNING,CANNULATED Left 11/04/2021   Procedure: CANNULATED HIP PINNING;  Surgeon: Wilhelmenia Harada, MD;  Location: [facility];  Service: Orthopedics;  Laterality: Left;   HOLMIUM LASER APPLICATION Bilateral 11/19/2021   Procedure: HOLMIUM LASER APPLICATION;  Surgeon: Osborn Blaze, MD;  Location: [facility];  Service: Urology;  Laterality: Bilateral;   INNER EAR SURGERY     Lost Hearing   TONSILLECTOMY AND ADENOIDECTOMY     VAGINAL HYSTERECTOMY     At age 63 due to "growth"      Inpatient Medications: Scheduled Meds:  enoxaparin  (LOVENOX ) injection  40 mg Subcutaneous Q24H   gabapentin   300 mg Oral 2 times per day   And   gabapentin   600 mg Oral QHS   insulin  aspart  0-5 Units Subcutaneous QHS   insulin  aspart  0-9 Units Subcutaneous TID WC   midodrine   5 mg Oral BID WC   pantoprazole   40 mg Oral Daily   propranolol   5 mg Oral TID   sertraline   25 mg Oral QHS   sodium chloride  flush  3 mL Intravenous Q12H   Continuous Infusions:  sodium chloride  100 mL/hr at 09/14/23 0629   PRN Meds: ibuprofen , oxyCODONE -acetaminophen , zolpidem   Allergies:    Allergies  Allergen Reactions   Amoxicillin Anaphylaxis and Shortness Of Breath   Bee Venom Shortness Of Breath   Penicillins Anaphylaxis, Swelling and Other (See Comments)    Remote reaction- Throat swelling - compromised airway Tolerates ceftriaxone    Pneumococcal Vaccines Other (See Comments)    Per patient due to Brain Aneurysm   Sulfa  Antibiotics Hives, Itching and Swelling   Bactrim  [Sulfamethoxazole -Trimethoprim ] Hives, Itching, Swelling and Other (See Comments)    Felt off balance    Social  History:   Social History   Socioeconomic History   Marital status: Divorced    Spouse name: Not on file   Number of children: 0   Years of education: Not on file   Highest education level: Some college, no degree  Occupational History   Occupation: Catering manager: [employer]    Comment: disabled  Tobacco Use   Smoking status: Former    Current packs/day: 0.00    Average packs/day: 0.5 packs/day for 40.0 years (20.0 ttl pk-yrs)    Types: Cigarettes    Start date: 05/26/1971    Quit date: 05/26/2011    Years since quitting: 12.3   Smokeless tobacco: Never  Vaping Use   Vaping status: Never Used  Substance and Sexual Activity   Alcohol use: No    Alcohol/week: 0.0 standard drinks of alcohol  Drug use: No   Sexual activity: Never  Other Topics Concern   Not on file  Social History Narrative   09/08/21 her mom lives with her   Caffeine-tea   Social Drivers of Health   Financial Resource Strain: Not on file  Food Insecurity: No Food Insecurity (09/13/2023)   Hunger Vital Sign    Worried About Running Out of Food in the Last Year: Never true    Ran Out of Food in the Last Year: Never true  Transportation Needs: No Transportation Needs (09/13/2023)   PRAPARE - Administrator, Civil Service (Medical): No    Lack of Transportation (Non-Medical): No  Physical Activity: Not on file  Stress: Not on file  Social Connections: Unknown (09/13/2023)   Social Connection and Isolation Panel [NHANES]    Frequency of Communication with Friends and Family: Once a week    Frequency of Social Gatherings with Friends and Family: Once a week    Attends Religious Services: 1 to 4 times per year    Active Member of [employer] or Organizations: Not on file    Attends Banker Meetings: 1 to 4 times per year    Marital Status: Not on file  Intimate Partner Violence: Not At Risk (09/13/2023)   Humiliation, Afraid, Rape, and Kick questionnaire    Fear of  Current or Ex-Partner: No    Emotionally Abused: No    Physically Abused: No    Sexually Abused: No    Family History:    Family History  Problem Relation Age of Onset   Asthma Mother    Allergies Mother    Diabetes Mother    Lung cancer Father        smoked   Throat cancer Father        smoked   Pancreatic cancer Brother    Hypertension Maternal Grandfather    Diabetes Maternal Grandfather    Emphysema Maternal Aunt        never smoked, spouse did   Breast cancer Maternal Aunt    Breast cancer Maternal Aunt    Breast cancer Paternal Aunt      ROS:  Please see the history of present illness.   All other ROS reviewed and negative.     Physical Exam/Data:   Vitals:   09/14/23 0449 09/14/23 0824 09/14/23 1100 09/14/23 1202  BP: (!) 150/76 (!) 161/73 (!) 109/58   Pulse: 78 89 86 92  Resp: 18 17    Temp: 98.2 F (36.8 C) 99 F (37.2 C)    TempSrc: Oral Oral    SpO2: 92% 90%    Weight:      Height:       No intake or output data in the 24 hours ending 09/14/23 1319    09/12/2023    9:26 PM 01/21/2022    3:21 AM 01/20/2022    8:00 AM  Last 3 Weights  Weight (lbs) 161 lb 139 lb 1.8 oz 154 lb 5.2 oz  Weight (kg) 73.029 kg 63.1 kg 70 kg     Body mass index is 25.22 kg/m.  General:  Well nourished, well developed, in no acute distress. Sitting comfortably in the armchair  HEENT: normal Neck: no JVD Vascular: Radial pulses 2+ bilaterally Cardiac:  normal S1, S2; RRR; no murmur   Lungs:  clear to auscultation bilaterally, no wheezing, rhonchi or rales. Normal WOB on room air  Abd: soft, nontender  Ext: no edema in BLE  Musculoskeletal:  No deformities  Skin: warm and dry  Neuro:  no focal abnormalities noted Psych:  Normal affect   EKG:  The EKG was personally reviewed and demonstrates:  EKG from 4/20 showed sinus tachycardia with HR 119 BPM Telemetry:  Telemetry was personally reviewed and demonstrates:  NSR, HR in the 70s-80s   Relevant CV Studies: Cardiac  Studies & Procedures   ______________________________________________________________________________________________     ECHOCARDIOGRAM  ECHOCARDIOGRAM COMPLETE 09/13/2023  Narrative ECHOCARDIOGRAM REPORT    Patient Name:   ANIQUE BECKLEY Date of Exam: 09/13/2023 Medical Rec #:  [id number]         Height:       67.0 in Accession #:    [id number]        Weight:       161.0 lb Date of Birth:  Apr 10, 1956         BSA:          1.844 m Patient Age:    67 years          BP:           129/79 mmHg Patient Gender: F                 HR:           75 bpm. Exam Location:  Inpatient  Procedure: 2D Echo, Cardiac Doppler and Color Doppler (Both Spectral and Color Flow Doppler were utilized during procedure).  Indications:    Elevated Troponin  History:        Patient has no prior history of Echocardiogram examinations. Arrythmias:Tachycardia, Signs/Symptoms:Hypotension; Risk Factors:Hypertension, Diabetes and Dyslipidemia.  Sonographer:    Terrilee Few RCS Referring Phys: [id number] ALLISON L ELLIS  IMPRESSIONS   1. Left ventricular ejection fraction, by estimation, is 55 to 60%. The left ventricle has normal function. The left ventricle demonstrates regional wall motion abnormalities with possible basal anterolateral hypokinesis. Left ventricular diastolic parameters are consistent with Grade I diastolic dysfunction (impaired relaxation). 2. Right ventricular systolic function is normal. The right ventricular size is normal. Tricuspid regurgitation signal is inadequate for assessing PA pressure. 3. Left atrial size was mildly dilated. 4. The mitral valve is normal in structure. No evidence of mitral valve regurgitation. No evidence of mitral stenosis. 5. The aortic valve is tricuspid. There is mild calcification of the aortic valve. Aortic valve regurgitation is not visualized. No aortic stenosis is present. 6. The inferior vena cava is normal in size with greater than 50% respiratory  variability, suggesting right atrial pressure of 3 mmHg.  FINDINGS Left Ventricle: Left ventricular ejection fraction, by estimation, is 55 to 60%. The left ventricle has normal function. The left ventricle demonstrates regional wall motion abnormalities. The left ventricular internal cavity size was normal in size. There is mild concentric left ventricular hypertrophy. Left ventricular diastolic parameters are consistent with Grade I diastolic dysfunction (impaired relaxation).  Right Ventricle: The right ventricular size is normal. No increase in right ventricular wall thickness. Right ventricular systolic function is normal. Tricuspid regurgitation signal is inadequate for assessing PA pressure.  Left Atrium: Left atrial size was mildly dilated.  Right Atrium: Right atrial size was normal in size.  Pericardium: Trivial pericardial effusion is present.  Mitral Valve: The mitral valve is normal in structure. No evidence of mitral valve regurgitation. No evidence of mitral valve stenosis.  Tricuspid Valve: The tricuspid valve is normal in structure. Tricuspid valve regurgitation is not demonstrated.  Aortic Valve: The aortic valve is tricuspid. There is mild calcification  of the aortic valve. Aortic valve regurgitation is not visualized. No aortic stenosis is present. Aortic valve peak gradient measures 6.2 mmHg.  Pulmonic Valve: The pulmonic valve was normal in structure. Pulmonic valve regurgitation is not visualized.  Aorta: The aortic root is normal in size and structure.  Venous: The inferior vena cava is normal in size with greater than 50% respiratory variability, suggesting right atrial pressure of 3 mmHg.  IAS/Shunts: No atrial level shunt detected by color flow Doppler.   LEFT VENTRICLE PLAX 2D LVIDd:         4.70 cm   Diastology LVIDs:         3.00 cm   LV e' medial:    6.20 cm/s LV PW:         1.00 cm   LV E/e' medial:  12.1 LV IVS:        0.80 cm   LV e' lateral:   5.77  cm/s LVOT diam:     1.90 cm   LV E/e' lateral: 13.0 LV SV:         54 LV SV Index:   29 LVOT Area:     2.84 cm   RIGHT VENTRICLE             IVC RV S prime:     11.20 cm/s  IVC diam: 1.90 cm TAPSE (M-mode): 2.0 cm  LEFT ATRIUM             Index        RIGHT ATRIUM           Index LA diam:        4.10 cm 2.22 cm/m   RA Area:     14.60 cm LA Vol (A2C):   35.6 ml 19.30 ml/m  RA Volume:   34.10 ml  18.49 ml/m LA Vol (A4C):   60.6 ml 32.86 ml/m LA Biplane Vol: 47.4 ml 25.70 ml/m AORTIC VALVE AV Area (Vmax): 2.14 cm AV Vmax:        125.00 cm/s AV Peak Grad:   6.2 mmHg LVOT Vmax:      94.20 cm/s LVOT Vmean:     59.100 cm/s LVOT VTI:       0.189 m  AORTA Ao Root diam: 2.90 cm Ao Asc diam:  3.00 cm  MITRAL VALVE MV Area (PHT): 3.72 cm    SHUNTS MV Decel Time: 204 msec    Systemic VTI:  0.19 m MV E velocity: 75.20 cm/s  Systemic Diam: 1.90 cm MV A velocity: 81.00 cm/s MV E/A ratio:  0.93  Dalton McleanMD Electronically signed by Archer Bear Signature Date/Time: 09/13/2023/5:21:52 PM    Final          ______________________________________________________________________________________________       Laboratory Data:  High Sensitivity Troponin:   Recent Labs  Lab 09/12/23 2135 09/13/23 0040 09/13/23 0638 09/13/23 1008  TROPONINIHS 18* 67* 118* 103*     Chemistry Recent Labs  Lab 09/12/23 2135 09/13/23 1807 09/14/23 0422  NA 135  --  137  K 3.9  --  4.3  CL 104  --  109  CO2 22  --  19*  GLUCOSE 238*  --  106*  BUN 13  --  10  CREATININE 0.90  --  0.71  CALCIUM  8.1*  --  7.7*  MG 1.4* 2.0 1.8  GFRNONAA >60  --  >60  ANIONGAP 9  --  9    Recent Labs  Lab 09/12/23 2135  PROT 6.5  ALBUMIN 3.1*  AST 22  ALT 16  ALKPHOS 59  BILITOT 1.3*   Lipids No results for input(s): "CHOL", "TRIG", "HDL", "LABVLDL", "LDLCALC", "CHOLHDL" in the last 168 hours.  Hematology Recent Labs  Lab 09/12/23 2135  WBC 10.4  RBC 4.77  HGB 12.7  HCT  38.8  MCV 81.3  MCH 26.6  MCHC 32.7  RDW 12.8  PLT 281   Thyroid   Recent Labs  Lab 09/13/23 0638  TSH 1.616    BNPNo results for input(s): "BNP", "PROBNP" in the last 168 hours.  DDimer No results for input(s): "DDIMER" in the last 168 hours.   Radiology/Studies:  ECHOCARDIOGRAM COMPLETE Result Date: 09/13/2023    ECHOCARDIOGRAM REPORT   Patient Name:   AAVYA SHAFER Date of Exam: 09/13/2023 Medical Rec #:  [id number]         Height:       67.0 in Accession #:    [id number]        Weight:       161.0 lb Date of Birth:  01/03/1956         BSA:          1.844 m Patient Age:    67 years          BP:           129/79 mmHg Patient Gender: F                 HR:           75 bpm. Exam Location:  Inpatient Procedure: 2D Echo, Cardiac Doppler and Color Doppler (Both Spectral and Color            Flow Doppler were utilized during procedure). Indications:    Elevated Troponin  History:        Patient has no prior history of Echocardiogram examinations.                 Arrythmias:Tachycardia, Signs/Symptoms:Hypotension; Risk                 Factors:Hypertension, Diabetes and Dyslipidemia.  Sonographer:    Terrilee Few RCS Referring Phys: [id number] ALLISON L ELLIS IMPRESSIONS  1. Left ventricular ejection fraction, by estimation, is 55 to 60%. The left ventricle has normal function. The left ventricle demonstrates regional wall motion abnormalities with possible basal anterolateral hypokinesis. Left ventricular diastolic parameters are consistent with Grade I diastolic dysfunction (impaired relaxation).  2. Right ventricular systolic function is normal. The right ventricular size is normal. Tricuspid regurgitation signal is inadequate for assessing PA pressure.  3. Left atrial size was mildly dilated.  4. The mitral valve is normal in structure. No evidence of mitral valve regurgitation. No evidence of mitral stenosis.  5. The aortic valve is tricuspid. There is mild calcification of the aortic valve. Aortic  valve regurgitation is not visualized. No aortic stenosis is present.  6. The inferior vena cava is normal in size with greater than 50% respiratory variability, suggesting right atrial pressure of 3 mmHg. FINDINGS  Left Ventricle: Left ventricular ejection fraction, by estimation, is 55 to 60%. The left ventricle has normal function. The left ventricle demonstrates regional wall motion abnormalities. The left ventricular internal cavity size was normal in size. There is mild concentric left ventricular hypertrophy. Left ventricular diastolic parameters are consistent with Grade I diastolic dysfunction (impaired relaxation). Right Ventricle: The right ventricular size is normal. No increase in right ventricular wall thickness. Right ventricular systolic function is  normal. Tricuspid regurgitation signal is inadequate for assessing PA pressure. Left Atrium: Left atrial size was mildly dilated. Right Atrium: Right atrial size was normal in size. Pericardium: Trivial pericardial effusion is present. Mitral Valve: The mitral valve is normal in structure. No evidence of mitral valve regurgitation. No evidence of mitral valve stenosis. Tricuspid Valve: The tricuspid valve is normal in structure. Tricuspid valve regurgitation is not demonstrated. Aortic Valve: The aortic valve is tricuspid. There is mild calcification of the aortic valve. Aortic valve regurgitation is not visualized. No aortic stenosis is present. Aortic valve peak gradient measures 6.2 mmHg. Pulmonic Valve: The pulmonic valve was normal in structure. Pulmonic valve regurgitation is not visualized. Aorta: The aortic root is normal in size and structure. Venous: The inferior vena cava is normal in size with greater than 50% respiratory variability, suggesting right atrial pressure of 3 mmHg. IAS/Shunts: No atrial level shunt detected by color flow Doppler.  LEFT VENTRICLE PLAX 2D LVIDd:         4.70 cm   Diastology LVIDs:         3.00 cm   LV e' medial:     6.20 cm/s LV PW:         1.00 cm   LV E/e' medial:  12.1 LV IVS:        0.80 cm   LV e' lateral:   5.77 cm/s LVOT diam:     1.90 cm   LV E/e' lateral: 13.0 LV SV:         54 LV SV Index:   29 LVOT Area:     2.84 cm  RIGHT VENTRICLE             IVC RV S prime:     11.20 cm/s  IVC diam: 1.90 cm TAPSE (M-mode): 2.0 cm LEFT ATRIUM             Index        RIGHT ATRIUM           Index LA diam:        4.10 cm 2.22 cm/m   RA Area:     14.60 cm LA Vol (A2C):   35.6 ml 19.30 ml/m  RA Volume:   34.10 ml  18.49 ml/m LA Vol (A4C):   60.6 ml 32.86 ml/m LA Biplane Vol: 47.4 ml 25.70 ml/m  AORTIC VALVE AV Area (Vmax): 2.14 cm AV Vmax:        125.00 cm/s AV Peak Grad:   6.2 mmHg LVOT Vmax:      94.20 cm/s LVOT Vmean:     59.100 cm/s LVOT VTI:       0.189 m  AORTA Ao Root diam: 2.90 cm Ao Asc diam:  3.00 cm MITRAL VALVE MV Area (PHT): 3.72 cm    SHUNTS MV Decel Time: 204 msec    Systemic VTI:  0.19 m MV E velocity: 75.20 cm/s  Systemic Diam: 1.90 cm MV A velocity: 81.00 cm/s MV E/A ratio:  0.93 Dalton McleanMD Electronically signed by Archer Bear Signature Date/Time: 09/13/2023/5:21:52 PM    Final    DG Ribs Unilateral W/Chest Right Result Date: 09/12/2023 CLINICAL DATA:  Status post fall with subsequent right-sided pain. EXAM: RIGHT RIBS AND CHEST - 3+ VIEW COMPARISON:  None Available. FINDINGS: A radiopaque marker is seen at the site of the patient's pain. No fracture or other bone lesions are seen involving the ribs. There is no evidence of pneumothorax or pleural effusion. Mild atelectatic changes are  seen within the bilateral lung bases. Heart size and mediastinal contours are within normal limits. IMPRESSION: 1. No acute osseous abnormality. Electronically Signed   By: Virgle Grime M.D.   On: 09/12/2023 23:33     Assessment and Plan:   Abnormal echocardiogram - Echocardiogram this admission showed EF 55-60%, regional wall motion abnormalities with possible basal anterolateral hypokinesis, grade 1 DD,  normal RV systolic function - On interview, patient denies ever having issues with chest pain.  Prior to admission she was able to walk up and down stairs, walk around outside, do chair yoga- all without any chest pain or SOB  - EKG on 4/20 after adenosine  showed sinus tachycardia, no ischemic changes  - With normal EF, no anginal symptoms, I do not think patient needs ischemic evaluation at this time.   SVT - Patient presented to the ED in SVT.  Heart rate did not improve with IV fluids, improved with adenosine  6 mg - SVT occurred in the setting of 2 days of nausea, vomiting, diarrhea, low magnesium  - Prior to admission, patient was on propranolol  5 mg 3 times daily fpr tremors  - Per telemetry, patient has now been maintaining NSR. She had one 4-beat run of NSVT yesterday evening, no ectopy otherwise  - Back on home propranolol  5 mg TID and tolerating well  - Discussed importance of maintaining adequate hydration, especially when she has a GI illness   Chronic hypotension Syncope  - Followed by PCP, continue midodrine  5 mg twice daily - Patient had an episode of syncope on 4/20 in the setting of vomiting, diarrhea. She had not been able to keep down her midodrine  at all that day. Syncopal episode occurred when she went from sitting to standing. Overall, likely orthostatic hypotension  - As above, encouraged patient to stay adequately hydrated and continue midodrine    Elevated troponin - High-sensitivity troponin 18, 67, 118, 103 - Patient without chest discomfort or shortness of breath  - Suspect demand ischemia due to SVT/tachycardia   Otherwise per primary - Hypomagnesemia - repleted - Recent nausea, vomiting, diarrhea - Type 2 diabetes - Diabetic peripheral neuropathy - History of migraines   Risk Assessment/Risk Scores:    For questions or updates, please contact [facility] Please consult [email or web address] for contact info under    Signed, Debria Fang,  PA-C  09/14/2023 1:19 PM

## 2023-09-14 NOTE — Discharge Summary (Signed)
 [facility] Discharge Summary   Patient: Laura Gentry ZOX:[id number]  PCP: Merl Star, MD  Date of admission: 09/12/2023   Date of discharge:  09/14/2023     Discharge Diagnoses:  Principal Problem:   SVT (supraventricular tachycardia) (HCC)   Admitted From: Home Disposition:  Home   Recommendations for Outpatient Follow-up:  Follow-up with PCP in 1 week, continue to monitor BP and heart rate at home and follow with PCP to titrate medication accordingly. Follow with PCP for lung cancer screening, may need CT scan chest. Cleared by cardiology, only needs cardiac follow up if she has recurrent SVT Follow up LABS/TEST:     Follow-up Information     Merl Star, MD Follow up in 1 week(s).   Specialty: Internal Medicine Contact information: [address] [place] [address] [phone]                Diet recommendation: Cardiac and Carb modified diet  Activity: The patient is advised to gradually reintroduce usual activities, as tolerated  Discharge Condition: stable  Code Status: Full code   History of present illness: As per the H and P dictated on admission Hospital Course:  Laura Gentry is a 68 y.o. female with medical history significant of intracranial aneurysm status post clipping in 2014, diabetes, dyslipidemia, renal calculi, and thyroid  disease.  Prior to presentation patient had had nausea vomiting and diarrhea for several days.  She became dizzy and had a syncopal episode while getting up from the chair.  She fell and hit her right chest.  She was reporting right-sided chest discomfort.  Upon EMS arrival patient heart rate was in the 160s to 170s.  She was not in atrial fibrillation.  She was given a 500 cc normal saline bolus as well as Zofran  by EMS.  She had not had any abdominal pain or bleeding with the diarrhea.  She was given an additional 1 L of normal saline but heart rate did not improve therefore was given 6 mg  of adenosine  with conversion back to a sinus rhythm.  He spoke with cardiologist on-call Dr. Teofilo Fellers who recommended overnight observation, trend troponins and obtain an echocardiogram.  Hospital service has accepted the patient for admission.  Of note x-rays of the ribs revealed no evidence of rib fractures.   Assessment and Plan:   # SVT Received 1.5 L normal saline bolus without improvement in tachycardia therefore was given adenosine  6 mg with conversion back to sinus rhythm. Initial potassium 3.9, potassium 40 mEq given. TTE LVEF 55 to 60%, LV demonstrates regional wall motion abnormality with possible basal anterolateral hypokinesis.  Grade 1 diastolic dysfunction. Patient was monitored on telemetry, remained in sinus rhythm, no further symptoms. Patient was seen by cardiology and recommended no further workup and cleared for discharge.  Patient was feeling well and agreed with the discharge planning.   # Chronic hypotension: Resume home midodrine  # Fall at home: Currently reporting right anterior rib cage pain.  Chest x-ray negative for rib fracture. Suspect rib contusion noting there is no bruising overlying the rib cage.  Recommended Tylenol  and ibuprofen  prn OTC.  # Elevated troponin Troponin: 18-67-118--103, trended down. Likely demand ischemia secondary to decreased perfusion from persistent high rate tachycardia EKG nonischemic.  TTE reviewed as above.  Seen by cardiology, no further workup, cleared for discharge.  # Hypomagnesemia: Repleted and resolved. # Recent nausea vomiting and diarrhea No evidence of flu or COVID.  Diarrhea resolved on Sunday 4/20,  no further episodes of nausea vomiting or diarrhea.  Recommended to continue oral hydration.  # Diabetes mellitus 2 with hyperglycemia: HbA1c 10.4 poorly controlled diabetes.  Resumed home dose Lantus  and NovoLog  sliding scale.  Patient was recommended to continue diabetic diet and monitor CBG at home, follow-up with PCP.  #  Diabetic peripheral neuropathy: Continue home Neurontin  # History of migraine: resumed Inderal  home dose   Body mass index is 25.22 kg/m.  Nutrition Interventions:  - Patient was instructed, not to drive, operate heavy machinery, perform activities at heights, swimming or participation in water activities or provide baby sitting services while on Pain, Sleep and Anxiety Medications; until her outpatient Physician has advised to do so again.  - Also recommended to not to take more than prescribed Pain, Sleep and Anxiety Medications.  Patient was ambulatory without any assistance. On the day of the discharge the patient's vitals were stable, and no other acute medical condition were reported by patient. the patient was felt safe to be discharge at Home.  Consultants: Cardiology Procedures: None  Discharge Exam: General: Appear in no distress, no Rash; Oral Mucosa Clear, moist. Cardiovascular: S1 and S2 Present, no Murmur, Respiratory: normal respiratory effort, Bilateral Air entry present and no Crackles, no wheezes Abdomen: Bowel Sound present, Soft and no tenderness, no hernia Extremities: no Pedal edema, no calf tenderness Neurology: alert and oriented to time, place, and person affect appropriate.  Filed Weights   09/12/23 2126  Weight: 73 kg   Vitals:   09/14/23 1100 09/14/23 1202  BP: (!) 109/58   Pulse: 86 92  Resp:    Temp:    SpO2:      DISCHARGE MEDICATION: Allergies as of 09/14/2023       Reactions   Amoxicillin Anaphylaxis, Shortness Of Breath   Bee Venom Shortness Of Breath   Penicillins Anaphylaxis, Swelling, Other (See Comments)   Remote reaction- Throat swelling - compromised airway Tolerates ceftriaxone    Pneumococcal Vaccines Other (See Comments)   Per patient due to Brain Aneurysm   Sulfa  Antibiotics Hives, Itching, Swelling   Bactrim  [sulfamethoxazole -trimethoprim ] Hives, Itching, Swelling, Other (See Comments)   Felt off balance         Medication List     TAKE these medications    acetaminophen  500 MG tablet Commonly known as: TYLENOL  Take 1 tablet (500 mg total) by mouth every 6 (six) hours as needed for mild pain or headache.   alendronate  70 MG tablet Commonly known as: FOSAMAX  Take 70 mg by mouth once a week.   atorvastatin  40 MG tablet Commonly known as: LIPITOR Take 1 tablet (40 mg total) by mouth daily. What changed: when to take this   cyanocobalamin  1000 MCG tablet Commonly known as: VITAMIN B12 Take 1,000 mcg by mouth daily.   gabapentin  300 MG capsule Commonly known as: NEURONTIN  Take 300 mg in AM, 300 mg at noon, 600 mg at bedtime What changed:  how much to take how to take this when to take this additional instructions   insulin  aspart 100 UNIT/ML injection Commonly known as: novoLOG  Inject 6-24 Units into the skin 3 (three) times daily before meals. Sliding scale: 100-140   = 6 units 141-180  =  10 units 181-220  =  12 units 221-260  =  14 units 261-300  =  16 units 301-340  =  18 units 341-380  =  20 units 381 > = 24 units   Lantus  SoloStar 100 UNIT/ML Solostar Pen Generic drug: insulin   glargine Inject 32 Units into the skin at bedtime. What changed: how much to take   midodrine  5 MG tablet Commonly known as: PROAMATINE  Take 1 tablet (5 mg total) by mouth 3 (three) times daily with meals. What changed: when to take this   Ozempic (0.25 or 0.5 MG/DOSE) 2 MG/3ML Sopn Generic drug: Semaglutide(0.25 or 0.5MG /DOS) Inject 0.5 mg into the skin once a week. Inject on Monday   propranolol  10 MG tablet Commonly known as: INDERAL  Take 10 mg by mouth 3 (three) times daily.   sertraline  25 MG tablet Commonly known as: ZOLOFT  Take 25 mg by mouth at bedtime.   thiamine  100 MG tablet Commonly known as: Vitamin B-1 Take 100 mg by mouth daily.   zolpidem  5 MG tablet Commonly known as: AMBIEN  Take 5 mg by mouth at bedtime as needed.       Allergies  Allergen Reactions    Amoxicillin Anaphylaxis and Shortness Of Breath   Bee Venom Shortness Of Breath   Penicillins Anaphylaxis, Swelling and Other (See Comments)    Remote reaction- Throat swelling - compromised airway Tolerates ceftriaxone    Pneumococcal Vaccines Other (See Comments)    Per patient due to Brain Aneurysm   Sulfa  Antibiotics Hives, Itching and Swelling   Bactrim  [Sulfamethoxazole -Trimethoprim ] Hives, Itching, Swelling and Other (See Comments)    Felt off balance   Discharge Instructions     Call MD for:   Complete by: As directed    Chest pain or palpitations   Call MD for:  difficulty breathing, headache or visual disturbances   Complete by: As directed    Call MD for:  extreme fatigue   Complete by: As directed    Call MD for:  persistant dizziness or light-headedness   Complete by: As directed    Call MD for:  persistant nausea and vomiting   Complete by: As directed    Call MD for:  severe uncontrolled pain   Complete by: As directed    Call MD for:  temperature >100.4   Complete by: As directed    Diet - low sodium heart healthy   Complete by: As directed    Discharge instructions   Complete by: As directed    Follow-up with PCP in 1 week, continue to monitor BP and heart rate at home and follow with PCP to titrate medication accordingly. Follow with PCP for lung cancer screening, may need CT scan chest. Cleared by cardiology, only needs cardiac follow up if she has recurrent SVT   Increase activity slowly   Complete by: As directed        The results of significant diagnostics from this hospitalization (including imaging, microbiology, ancillary and laboratory) are listed below for reference.    Significant Diagnostic Studies: ECHOCARDIOGRAM COMPLETE Result Date: 09/13/2023    ECHOCARDIOGRAM REPORT   Patient Name:   CHELLSEA BECKERS Date of Exam: 09/13/2023 Medical Rec #:  [id number]         Height:       67.0 in Accession #:    [id number]        Weight:       161.0 lb  Date of Birth:  23-Dec-1955         BSA:          1.844 m Patient Age:    67 years          BP:           129/79 mmHg Patient Gender: F  HR:           75 bpm. Exam Location:  Inpatient Procedure: 2D Echo, Cardiac Doppler and Color Doppler (Both Spectral and Color            Flow Doppler were utilized during procedure). Indications:    Elevated Troponin  History:        Patient has no prior history of Echocardiogram examinations.                 Arrythmias:Tachycardia, Signs/Symptoms:Hypotension; Risk                 Factors:Hypertension, Diabetes and Dyslipidemia.  Sonographer:    Terrilee Few RCS Referring Phys: [id number] ALLISON L ELLIS IMPRESSIONS  1. Left ventricular ejection fraction, by estimation, is 55 to 60%. The left ventricle has normal function. The left ventricle demonstrates regional wall motion abnormalities with possible basal anterolateral hypokinesis. Left ventricular diastolic parameters are consistent with Grade I diastolic dysfunction (impaired relaxation).  2. Right ventricular systolic function is normal. The right ventricular size is normal. Tricuspid regurgitation signal is inadequate for assessing PA pressure.  3. Left atrial size was mildly dilated.  4. The mitral valve is normal in structure. No evidence of mitral valve regurgitation. No evidence of mitral stenosis.  5. The aortic valve is tricuspid. There is mild calcification of the aortic valve. Aortic valve regurgitation is not visualized. No aortic stenosis is present.  6. The inferior vena cava is normal in size with greater than 50% respiratory variability, suggesting right atrial pressure of 3 mmHg. FINDINGS  Left Ventricle: Left ventricular ejection fraction, by estimation, is 55 to 60%. The left ventricle has normal function. The left ventricle demonstrates regional wall motion abnormalities. The left ventricular internal cavity size was normal in size. There is mild concentric left ventricular hypertrophy. Left  ventricular diastolic parameters are consistent with Grade I diastolic dysfunction (impaired relaxation). Right Ventricle: The right ventricular size is normal. No increase in right ventricular wall thickness. Right ventricular systolic function is normal. Tricuspid regurgitation signal is inadequate for assessing PA pressure. Left Atrium: Left atrial size was mildly dilated. Right Atrium: Right atrial size was normal in size. Pericardium: Trivial pericardial effusion is present. Mitral Valve: The mitral valve is normal in structure. No evidence of mitral valve regurgitation. No evidence of mitral valve stenosis. Tricuspid Valve: The tricuspid valve is normal in structure. Tricuspid valve regurgitation is not demonstrated. Aortic Valve: The aortic valve is tricuspid. There is mild calcification of the aortic valve. Aortic valve regurgitation is not visualized. No aortic stenosis is present. Aortic valve peak gradient measures 6.2 mmHg. Pulmonic Valve: The pulmonic valve was normal in structure. Pulmonic valve regurgitation is not visualized. Aorta: The aortic root is normal in size and structure. Venous: The inferior vena cava is normal in size with greater than 50% respiratory variability, suggesting right atrial pressure of 3 mmHg. IAS/Shunts: No atrial level shunt detected by color flow Doppler.  LEFT VENTRICLE PLAX 2D LVIDd:         4.70 cm   Diastology LVIDs:         3.00 cm   LV e' medial:    6.20 cm/s LV PW:         1.00 cm   LV E/e' medial:  12.1 LV IVS:        0.80 cm   LV e' lateral:   5.77 cm/s LVOT diam:     1.90 cm   LV E/e' lateral: 13.0 LV SV:  54 LV SV Index:   29 LVOT Area:     2.84 cm  RIGHT VENTRICLE             IVC RV S prime:     11.20 cm/s  IVC diam: 1.90 cm TAPSE (M-mode): 2.0 cm LEFT ATRIUM             Index        RIGHT ATRIUM           Index LA diam:        4.10 cm 2.22 cm/m   RA Area:     14.60 cm LA Vol (A2C):   35.6 ml 19.30 ml/m  RA Volume:   34.10 ml  18.49 ml/m LA Vol  (A4C):   60.6 ml 32.86 ml/m LA Biplane Vol: 47.4 ml 25.70 ml/m  AORTIC VALVE AV Area (Vmax): 2.14 cm AV Vmax:        125.00 cm/s AV Peak Grad:   6.2 mmHg LVOT Vmax:      94.20 cm/s LVOT Vmean:     59.100 cm/s LVOT VTI:       0.189 m  AORTA Ao Root diam: 2.90 cm Ao Asc diam:  3.00 cm MITRAL VALVE MV Area (PHT): 3.72 cm    SHUNTS MV Decel Time: 204 msec    Systemic VTI:  0.19 m MV E velocity: 75.20 cm/s  Systemic Diam: 1.90 cm MV A velocity: 81.00 cm/s MV E/A ratio:  0.93 Dalton McleanMD Electronically signed by Archer Bear Signature Date/Time: 09/13/2023/5:21:52 PM    Final    DG Ribs Unilateral W/Chest Right Result Date: 09/12/2023 CLINICAL DATA:  Status post fall with subsequent right-sided pain. EXAM: RIGHT RIBS AND CHEST - 3+ VIEW COMPARISON:  None Available. FINDINGS: A radiopaque marker is seen at the site of the patient's pain. No fracture or other bone lesions are seen involving the ribs. There is no evidence of pneumothorax or pleural effusion. Mild atelectatic changes are seen within the bilateral lung bases. Heart size and mediastinal contours are within normal limits. IMPRESSION: 1. No acute osseous abnormality. Electronically Signed   By: Virgle Grime M.D.   On: 09/12/2023 23:33    Microbiology: Recent Results (from the past 240 hours)  Resp panel by RT-PCR (RSV, Flu A&B, Covid) Anterior Nasal Swab     Status: None   Collection Time: 09/13/23  7:47 AM   Specimen: Anterior Nasal Swab  Result Value Ref Range Status   SARS Coronavirus 2 by RT PCR NEGATIVE NEGATIVE Final   Influenza A by PCR NEGATIVE NEGATIVE Final   Influenza B by PCR NEGATIVE NEGATIVE Final    Comment: (NOTE) The Xpert Xpress SARS-CoV-2/FLU/RSV plus assay is intended as an aid in the diagnosis of influenza from Nasopharyngeal swab specimens and should not be used as a sole basis for treatment. Nasal washings and aspirates are unacceptable for Xpert Xpress SARS-CoV-2/FLU/RSV testing.  Fact Sheet for  Patients: BloggerCourse.com  Fact Sheet for Healthcare Providers: SeriousBroker.it  This test is not yet approved or cleared by the [place]  FDA and has been authorized for detection and/or diagnosis of SARS-CoV-2 by FDA under an Emergency Use Authorization (EUA). This EUA will remain in effect (meaning this test can be used) for the duration of the COVID-19 declaration under Section 564(b)(1) of the Act, 21 U.S.C. section 360bbb-3(b)(1), unless the authorization is terminated or revoked.     Resp Syncytial Virus by PCR NEGATIVE NEGATIVE Final    Comment: (NOTE) Fact Sheet for  Patients: BloggerCourse.com  Fact Sheet for Healthcare Providers: SeriousBroker.it  This test is not yet approved or cleared by the [place]  FDA and has been authorized for detection and/or diagnosis of SARS-CoV-2 by FDA under an Emergency Use Authorization (EUA). This EUA will remain in effect (meaning this test can be used) for the duration of the COVID-19 declaration under Section 564(b)(1) of the Act, 21 U.S.C. section 360bbb-3(b)(1), unless the authorization is terminated or revoked.  Performed at [facility], [address] [place], [place] [address]      Labs: CBC: Recent Labs  Lab 09/12/23 2135  WBC 10.4  NEUTROABS 7.7  HGB 12.7  HCT 38.8  MCV 81.3  PLT 281   Basic Metabolic Panel: Recent Labs  Lab 09/12/23 2135 09/13/23 1807 09/14/23 0422  NA 135  --  137  K 3.9  --  4.3  CL 104  --  109  CO2 22  --  19*  GLUCOSE 238*  --  106*  BUN 13  --  10  CREATININE 0.90  --  0.71  CALCIUM  8.1*  --  7.7*  MG 1.4* 2.0 1.8   Liver Function Tests: Recent Labs  Lab 09/12/23 2135  AST 22  ALT 16  ALKPHOS 59  BILITOT 1.3*  PROT 6.5  ALBUMIN 3.1*   Recent Labs  Lab 09/12/23 2135  LIPASE 29   No results for input(s): "AMMONIA" in the last 168 hours. Cardiac  Enzymes: No results for input(s): "CKTOTAL", "CKMB", "CKMBINDEX", "TROPONINI" in the last 168 hours. BNP (last 3 results) No results for input(s): "BNP" in the last 8760 hours. CBG: Recent Labs  Lab 09/13/23 0744 09/13/23 1816 09/13/23 2025 09/14/23 0820 09/14/23 1258  GLUCAP 89 77 193* 116* 189*    Time spent: 35 minutes  Signed:  Althia Atlas  [facility] 09/14/2023 2:56 PM

## 2023-09-14 NOTE — Plan of Care (Signed)
  Problem: Education: Goal: Ability to describe self-care measures that may prevent or decrease complications (Diabetes Survival Skills Education) will improve 09/14/2023 0504 by Carleton Cheek, RN Outcome: Progressing 09/14/2023 0504 by Carleton Cheek, RN Outcome: Progressing Goal: Individualized Educational Video(s) 09/14/2023 0504 by Carleton Cheek, RN Outcome: Progressing 09/14/2023 0504 by Carleton Cheek, RN Outcome: Progressing   Problem: Coping: Goal: Ability to adjust to condition or change in health will improve 09/14/2023 0504 by Carleton Cheek, RN Outcome: Progressing 09/14/2023 0504 by Carleton Cheek, RN Outcome: Progressing   Problem: Fluid Volume: Goal: Ability to maintain a balanced intake and output will improve 09/14/2023 0504 by Carleton Cheek, RN Outcome: Progressing 09/14/2023 0504 by Carleton Cheek, RN Outcome: Progressing   Problem: Health Behavior/Discharge Planning: Goal: Ability to identify and utilize available resources and services will improve 09/14/2023 0504 by Carleton Cheek, RN Outcome: Progressing 09/14/2023 0504 by Carleton Cheek, RN Outcome: Progressing Goal: Ability to manage health-related needs will improve 09/14/2023 0504 by Carleton Cheek, RN Outcome: Progressing 09/14/2023 0504 by Carleton Cheek, RN Outcome: Progressing   Problem: Metabolic: Goal: Ability to maintain appropriate glucose levels will improve 09/14/2023 0504 by Carleton Cheek, RN Outcome: Progressing 09/14/2023 0504 by Carleton Cheek, RN Outcome: Progressing   Problem: Nutritional: Goal: Maintenance of adequate nutrition will improve 09/14/2023 0504 by Carleton Cheek, RN Outcome: Progressing 09/14/2023 0504 by Carleton Cheek, RN Outcome: Progressing Goal: Progress toward achieving an optimal weight will improve 09/14/2023 0504 by Carleton Cheek, RN Outcome: Progressing 09/14/2023 0504 by Carleton Cheek, RN Outcome: Progressing   Problem: Skin Integrity: Goal: Risk for impaired skin  integrity will decrease 09/14/2023 0504 by Carleton Cheek, RN Outcome: Progressing 09/14/2023 0504 by Carleton Cheek, RN Outcome: Progressing   Problem: Tissue Perfusion: Goal: Adequacy of tissue perfusion will improve Outcome: Progressing   Problem: Education: Goal: Knowledge of General Education information will improve Description: Including pain rating scale, medication(s)/side effects and non-pharmacologic comfort measures Outcome: Progressing   Problem: Health Behavior/Discharge Planning: Goal: Ability to manage health-related needs will improve Outcome: Progressing   Problem: Clinical Measurements: Goal: Ability to maintain clinical measurements within normal limits will improve Outcome: Progressing Goal: Will remain free from infection Outcome: Progressing Goal: Diagnostic test results will improve Outcome: Progressing Goal: Respiratory complications will improve Outcome: Progressing Goal: Cardiovascular complication will be avoided Outcome: Progressing   Problem: Activity: Goal: Risk for activity intolerance will decrease Outcome: Progressing   Problem: Nutrition: Goal: Adequate nutrition will be maintained Outcome: Progressing   Problem: Coping: Goal: Level of anxiety will decrease Outcome: Progressing   Problem: Elimination: Goal: Will not experience complications related to bowel motility Outcome: Progressing Goal: Will not experience complications related to urinary retention Outcome: Progressing   Problem: Pain Managment: Goal: General experience of comfort will improve and/or be controlled Outcome: Progressing   Problem: Safety: Goal: Ability to remain free from injury will improve Outcome: Progressing   Problem: Skin Integrity: Goal: Risk for impaired skin integrity will decrease Outcome: Progressing

## 2023-09-14 NOTE — Evaluation (Signed)
 Physical Therapy Evaluation Patient Details Name: Laura Gentry MRN: [id number] DOB: 22-Nov-1955 Today's Date: 09/14/2023  History of Present Illness  68 y.o. female admitted 09/12/23 after syncope getting up from chair. Pt in SVT upon EMS arrival with hypokalemia. PMHx: intracranial aneurysm s/p clipping in 2014, DM, HLD, renal calculi, thyroid  disease, insomnia, tremor  Clinical Impression  Pt pleasant, lives with sister in 2 story house with bed/bath upstairs but does have area downstairs where she can sleep. Pt with right chest soreness, hesitant with gait but able to walk in hall without physical assist. Pt denies further falls and reports N/V leading to hypokalemia PTA. Pt with HR 88-94 during mobility with VSS, no dizziness or syncope. Pt with decreased gait speed and activity tolerance who will benefit from acute therapy to maximize mobility and safety to return home.      If plan is discharge home, recommend the following: Assistance with cooking/housework;Help with stairs or ramp for entrance;Assist for transportation   Can travel by private vehicle        Equipment Recommendations None recommended by PT  Recommendations for Other Services       Functional Status Assessment Patient has had a recent decline in their functional status and demonstrates the ability to make significant improvements in function in a reasonable and predictable amount of time.     Precautions / Restrictions Precautions Precautions: Fall      Mobility  Bed Mobility Overal bed mobility: Modified Independent                  Transfers Overall transfer level: Needs assistance   Transfers: Sit to/from Stand Sit to Stand: Supervision           General transfer comment: assist for IV, initial guarding as pt reaching for support but with static standing able to move without AD    Ambulation/Gait Ambulation/Gait assistance: Supervision Gait Distance (Feet): 300 Feet Assistive  device: None Gait Pattern/deviations: Step-through pattern, Decreased stride length   Gait velocity interpretation: 1.31 - 2.62 ft/sec, indicative of limited community ambulator   General Gait Details: pt guarded with decreased speed, stride and arm swing but able to maintain balance without assist  Stairs            Wheelchair Mobility     Tilt Bed    Modified Rankin (Stroke Patients Only)       Balance Overall balance assessment: Mild deficits observed, not formally tested                                           Pertinent Vitals/Pain Pain Assessment Pain Assessment: 0-10 Pain Score: 3  Pain Location: Rt chest where she fell Pain Descriptors / Indicators: Aching Pain Intervention(s): Limited activity within patient's tolerance, Monitored during session, Repositioned    Home Living Family/patient expects to be discharged to:: Private residence Living Arrangements: Other relatives Available Help at Discharge: Family;Available 24 hours/day Type of Home: House Home Access: Stairs to enter Entrance Stairs-Rails: Left Entrance Stairs-Number of Steps: 7   Home Layout: Two level;Bed/bath upstairs;1/2 bath on main level Home Equipment: Rolling Walker (2 wheels);Cane - single point;Shower seat;BSC/3in1 Additional Comments: pt lives on 2nd floor but can stay downstairs if needed    Prior Function Prior Level of Function : Independent/Modified Independent             Mobility Comments: doesn't use  AD       Extremity/Trunk Assessment   Upper Extremity Assessment Upper Extremity Assessment: Overall WFL for tasks assessed    Lower Extremity Assessment Lower Extremity Assessment: Overall WFL for tasks assessed    Cervical / Trunk Assessment Cervical / Trunk Assessment: Normal  Communication   Communication Communication: No apparent difficulties    Cognition Arousal: Alert Behavior During Therapy: WFL for tasks assessed/performed    PT - Cognitive impairments: No apparent impairments                         Following commands: Intact       Cueing Cueing Techniques: Verbal cues     General Comments      Exercises     Assessment/Plan    PT Assessment Patient needs continued PT services  PT Problem List Decreased activity tolerance;Decreased balance;Decreased mobility       PT Treatment Interventions Gait training;Stair training;Functional mobility training;Therapeutic activities;Therapeutic exercise;Patient/family education;DME instruction;Balance training    PT Goals (Current goals can be found in the Care Plan section)  Acute Rehab PT Goals Patient Stated Goal: return home PT Goal Formulation: With patient Time For Goal Achievement: 09/28/23 Potential to Achieve Goals: Good    Frequency Min 2X/week     Co-evaluation               AM-PAC PT "6 Clicks" Mobility  Outcome Measure Help needed turning from your back to your side while in a flat bed without using bedrails?: None Help needed moving from lying on your back to sitting on the side of a flat bed without using bedrails?: None Help needed moving to and from a bed to a chair (including a wheelchair)?: A Little Help needed standing up from a chair using your arms (e.g., wheelchair or bedside chair)?: A Little Help needed to walk in hospital room?: A Little Help needed climbing 3-5 steps with a railing? : A Little 6 Click Score: 20    End of Session   Activity Tolerance: Patient tolerated treatment well Patient left: in chair;with call bell/phone within reach;with chair alarm set Nurse Communication: Mobility status PT Visit Diagnosis: Other abnormalities of gait and mobility (R26.89)    Time: 7829-5621 PT Time Calculation (min) (ACUTE ONLY): 16 min   Charges:   PT Evaluation $PT Eval Low Complexity: 1 Low   PT General Charges $$ ACUTE PT VISIT: 1 Visit         Annis Baseman, PT Acute Rehabilitation  Services Office: [phone]   Jackey Mary Summer Mccolgan 09/14/2023, 12:07 PM

## 2023-09-14 NOTE — Care Management Obs Status (Addendum)
 MEDICARE OBSERVATION STATUS NOTIFICATION   Patient Details  Name: Laura Gentry MRN: [id number] Date of Birth: 21-Feb-1956   Medicare Observation Status Notification Given:     Mindi Alto, verbally reviewed observation notice with [name] telephonically at [phone]. Sent a letter by certified mail.  Palmer Fahrner 09/14/2023, 11:29 AM

## 2023-09-22 DIAGNOSIS — R0600 Dyspnea, unspecified: Secondary | ICD-10-CM | POA: Diagnosis not present

## 2023-09-22 DIAGNOSIS — I471 Supraventricular tachycardia, unspecified: Secondary | ICD-10-CM | POA: Diagnosis not present

## 2023-09-22 DIAGNOSIS — I959 Hypotension, unspecified: Secondary | ICD-10-CM | POA: Diagnosis not present

## 2023-09-22 DIAGNOSIS — R748 Abnormal levels of other serum enzymes: Secondary | ICD-10-CM | POA: Diagnosis not present

## 2023-09-22 DIAGNOSIS — R55 Syncope and collapse: Secondary | ICD-10-CM | POA: Diagnosis not present

## 2023-09-22 DIAGNOSIS — R112 Nausea with vomiting, unspecified: Secondary | ICD-10-CM | POA: Diagnosis not present

## 2023-09-23 DIAGNOSIS — M810 Age-related osteoporosis without current pathological fracture: Secondary | ICD-10-CM | POA: Diagnosis not present

## 2023-09-23 DIAGNOSIS — I700 Atherosclerosis of aorta: Secondary | ICD-10-CM | POA: Diagnosis not present

## 2023-09-23 DIAGNOSIS — E1165 Type 2 diabetes mellitus with hyperglycemia: Secondary | ICD-10-CM | POA: Diagnosis not present

## 2023-09-23 DIAGNOSIS — Z8616 Personal history of COVID-19: Secondary | ICD-10-CM | POA: Diagnosis not present

## 2023-09-23 DIAGNOSIS — E1169 Type 2 diabetes mellitus with other specified complication: Secondary | ICD-10-CM | POA: Diagnosis not present

## 2023-10-06 ENCOUNTER — Other Ambulatory Visit (HOSPITAL_COMMUNITY): Payer: Self-pay

## 2023-10-06 ENCOUNTER — Other Ambulatory Visit: Payer: Self-pay | Admitting: Internal Medicine

## 2023-10-06 DIAGNOSIS — Z1231 Encounter for screening mammogram for malignant neoplasm of breast: Secondary | ICD-10-CM

## 2023-10-08 NOTE — H&P (View-Only) (Signed)
 Cardiology Office Note:    Date:  10/12/2023   ID:  Laura Gentry, Laura Gentry 1956/04/11, MRN [id number]  PCP:  Merl Star, MD   [facility] Providers Cardiologist:  None     Referring MD: Merl Star, MD   Chief Complaint  Patient presents with   Shortness of Breath    History of Present Illness:    Laura Gentry is a 68 y.o. female seen for follow up SVT. She was recently admitted with acute GI illness related to Norovirus. Went into SVT that broke with IV adenosine . Troponin increased to 118. Echo done showing normal overall LV function. There was some question of basal anterior HK but in my review it was a difficult area to assess due to Echo drop out and difficulty seeing the endocardial border. History of remote cerebral aneurysm clipping. History of orthostatic hypotension on midodrine . Was taking propranolol  chronically for tremor but this was discontinued.   On follow up today she reports that since this illness she has been more SOB. She also reports heaviness in her chest. She is experiencing more tachycardia. She also has dizziness that may or may not be related to her tachycardia.   Past Medical History:  Diagnosis Date   Brain aneurysm 03/19/2013   Cataracts, bilateral    Cerebral aneurysm    Colon polyps    Diabetes (HCC) 2013   High cholesterol    History of kidney stones    Insomnia    Lumbosacral radiculopathy    Osteoporosis    Skin cancer    squamous   Thyroid  disease    Tremor     Past Surgical History:  Procedure Laterality Date   APPENDECTOMY     BRAIN SURGERY  03/19/2013   Sugita aneyrsum clip can have MRI up to 4T docs scanned in    BREAST BIOPSY Right    CHOLECYSTECTOMY  2013   CYSTOSCOPY WITH RETROGRADE PYELOGRAM, URETEROSCOPY AND STENT PLACEMENT Bilateral 10/29/2021   Procedure: CYSTOSCOPY WITH RETROGRADE PYELOGRAM, URETEROSCOPY AND STENT PLACEMENT;  Surgeon: Osborn Blaze, MD;  Location: [facility];  Service: Urology;   Laterality: Bilateral;   CYSTOSCOPY WITH RETROGRADE PYELOGRAM, URETEROSCOPY AND STENT PLACEMENT Bilateral 11/19/2021   Procedure: CYSTOSCOPY WITH RETROGRADE PYELOGRAM, URETEROSCOPY AND STENT EXCHANGE, BASKET STONE REMOVAL;  Surgeon: Osborn Blaze, MD;  Location: [facility];  Service: Urology;  Laterality: Bilateral;  75 MINS   HIP PINNING,CANNULATED Left 11/04/2021   Procedure: CANNULATED HIP PINNING;  Surgeon: Wilhelmenia Harada, MD;  Location: [facility];  Service: Orthopedics;  Laterality: Left;   HOLMIUM LASER APPLICATION Bilateral 11/19/2021   Procedure: HOLMIUM LASER APPLICATION;  Surgeon: Osborn Blaze, MD;  Location: [facility];  Service: Urology;  Laterality: Bilateral;   INNER EAR SURGERY     Lost Hearing   TONSILLECTOMY AND ADENOIDECTOMY     VAGINAL HYSTERECTOMY     At age 64 due to "growth"    Current Medications: Current Meds  Medication Sig   acetaminophen  (TYLENOL ) 500 MG tablet Take 1 tablet (500 mg total) by mouth every 6 (six) hours as needed for mild pain or headache.   alendronate  (FOSAMAX ) 70 MG tablet Take 70 mg by mouth once a week.   atorvastatin  (LIPITOR) 40 MG tablet Take 1 tablet (40 mg total) by mouth daily. (Patient taking differently: Take 40 mg by mouth at bedtime.)   gabapentin  (NEURONTIN ) 300 MG capsule Take 300 mg in AM, 300 mg at noon, 600 mg at bedtime (Patient taking differently: Take 900 mg  by mouth at bedtime.)   insulin  aspart (NOVOLOG ) 100 UNIT/ML injection Inject 6-24 Units into the skin 3 (three) times daily before meals. Sliding scale: 100-140   = 6 units 141-180  =  10 units 181-220  =  12 units 221-260  =  14 units 261-300  =  16 units 301-340  =  18 units 341-380  =  20 units 381 > = 24 units   insulin  glargine (LANTUS  SOLOSTAR) 100 UNIT/ML Solostar Pen Inject 32 Units into the skin at bedtime. (Patient taking differently: Inject 45 Units into the skin at bedtime.)   midodrine  (PROAMATINE ) 5 MG tablet Take 1 tablet (5 mg total) by mouth 3 (three)  times daily with meals. (Patient taking differently: Take 5 mg by mouth 2 (two) times daily with a meal.)   propranolol  (INDERAL ) 10 MG tablet Take 10 mg by mouth 3 (three) times daily.   Semaglutide,0.25 or 0.5MG /DOS, (OZEMPIC, 0.25 OR 0.5 MG/DOSE,) 2 MG/3ML SOPN Inject 0.5 mg into the skin once a week. Inject on Monday   sertraline  (ZOLOFT ) 25 MG tablet Take 25 mg by mouth at bedtime.   thiamine  (VITAMIN B-1) 100 MG tablet Take 100 mg by mouth daily.   vitamin B-12 (CYANOCOBALAMIN ) 1000 MCG tablet Take 1,000 mcg by mouth daily.   zolpidem  (AMBIEN ) 5 MG tablet Take 5 mg by mouth at bedtime as needed.     Allergies:   Amoxicillin, Bee venom, Penicillins, Pneumococcal vaccines, Sulfa  antibiotics, and Bactrim  [sulfamethoxazole -trimethoprim ]   Social History   Socioeconomic History   Marital status: Divorced    Spouse name: Not on file   Number of children: 0   Years of education: Not on file   Highest education level: Some college, no degree  Occupational History   Occupation: Catering manager: [employer]    Comment: disabled  Tobacco Use   Smoking status: Former    Current packs/day: 0.00    Average packs/day: 0.5 packs/day for 40.0 years (20.0 ttl pk-yrs)    Types: Cigarettes    Start date: 05/26/1971    Quit date: 05/26/2011    Years since quitting: 12.3   Smokeless tobacco: Never  Vaping Use   Vaping status: Never Used  Substance and Sexual Activity   Alcohol use: No    Alcohol/week: 0.0 standard drinks of alcohol   Drug use: No   Sexual activity: Never  Other Topics Concern   Not on file  Social History Narrative   09/08/21 her mom lives with her   Caffeine-tea   Social Drivers of Health   Financial Resource Strain: Not on file  Food Insecurity: No Food Insecurity (09/13/2023)   Hunger Vital Sign    Worried About Running Out of Food in the Last Year: Never true    Ran Out of Food in the Last Year: Never true  Transportation Needs: No  Transportation Needs (09/13/2023)   PRAPARE - Administrator, Civil Service (Medical): No    Lack of Transportation (Non-Medical): No  Physical Activity: Not on file  Stress: Not on file  Social Connections: Unknown (09/13/2023)   Social Connection and Isolation Panel [NHANES]    Frequency of Communication with Friends and Family: Once a week    Frequency of Social Gatherings with Friends and Family: Once a week    Attends Religious Services: 1 to 4 times per year    Active Member of [employer] or Organizations: Not on file  Attends Banker Meetings: 1 to 4 times per year    Marital Status: Not on file     Family History: The patient's family history includes Allergies in her mother; Asthma in her mother; Breast cancer in her maternal aunt, maternal aunt, and paternal aunt; Diabetes in her maternal grandfather and mother; Emphysema in her maternal aunt; Hypertension in her maternal grandfather; Lung cancer in her father; Pancreatic cancer in her brother; Throat cancer in her father.  ROS:   Please see the history of present illness.     All other systems reviewed and are negative.  EKGs/Labs/Other Studies Reviewed:    The following studies were reviewed today: Echo 09/13/23: IMPRESSIONS     1. Left ventricular ejection fraction, by estimation, is 55 to 60%. The  left ventricle has normal function. The left ventricle demonstrates  regional wall motion abnormalities with possible basal anterolateral  hypokinesis. Left ventricular diastolic  parameters are consistent with Grade I diastolic dysfunction (impaired  relaxation).   2. Right ventricular systolic function is normal. The right ventricular  size is normal. Tricuspid regurgitation signal is inadequate for assessing  PA pressure.   3. Left atrial size was mildly dilated.   4. The mitral valve is normal in structure. No evidence of mitral valve  regurgitation. No evidence of mitral stenosis.   5. The  aortic valve is tricuspid. There is mild calcification of the  aortic valve. Aortic valve regurgitation is not visualized. No aortic  stenosis is present.   6. The inferior vena cava is normal in size with greater than 50%  respiratory variability, suggesting right atrial pressure of 3 mmHg.        Recent Labs: 09/12/2023: ALT 16; Hemoglobin 12.7; Platelets 281 09/13/2023: TSH 1.616 09/14/2023: BUN 10; Creatinine, Ser 0.71; Magnesium  1.8; Potassium 4.3; Sodium 137  Recent Lipid Panel    Component Value Date/Time   CHOL 158 10/29/2021 1008   CHOL 192 01/31/2018 1449   TRIG 174 (H) 10/29/2021 1008   HDL 29 (L) 10/29/2021 1008   HDL 26 (L) 01/31/2018 1449   CHOLHDL 5.4 10/29/2021 1008   VLDL 35 10/29/2021 1008   LDLCALC 94 10/29/2021 1008   LDLCALC 134 (H) 01/31/2018 1449   LDLDIRECT 120.0 09/26/2015 1226     Risk Assessment/Calculations:                Physical Exam:    VS:  BP 100/64   Pulse 94   Ht 5\' 6"  (1.676 m)   Wt 161 lb 9.6 oz (73.3 kg)   SpO2 92%   BMI 26.08 kg/m     Wt Readings from Last 3 Encounters:  10/12/23 161 lb 9.6 oz (73.3 kg)  09/12/23 161 lb (73 kg)  01/21/22 139 lb 1.8 oz (63.1 kg)     GEN:  Well nourished, well developed in no acute distress HEENT: Normal NECK: No JVD; No carotid bruits LYMPHATICS: No lymphadenopathy CARDIAC: RRR, no murmurs, rubs, gallops RESPIRATORY:  Clear to auscultation without rales, wheezing or rhonchi  ABDOMEN: Soft, non-tender, non-distended MUSCULOSKELETAL:  No edema; No deformity  SKIN: Warm and dry NEUROLOGIC:  Alert and oriented x 3 PSYCHIATRIC:  Normal affect   ASSESSMENT:    1. Dyspnea on exertion   2. Chest pain, precordial   3. SVT (supraventricular tachycardia) (HCC)    PLAN:    In order of problems listed above:  DOE and chest heaviness. Echo in the hospital was [facility] but concerned about CAD given her  longstanding DM. Will arrange for coronary CTA. Antianginal therapy limited by low BP. Will  await CT results SVT with persistent episodes of tachycardia. Therapy limited by low BP and orthostasis. Will place a 2 week event monitor. May need to consider ablation of her SVT DM Orthostatic hypotension. On midodrine . Recommend liberal salt intake and maintain good hydration. Recommend compression hose. Raise head of bed to activate baroreceptors.        Follow up after above studies   Medication Adjustments/Labs and Tests Ordered: Current medicines are reviewed at length with the patient today.  Concerns regarding medicines are outlined above.  No orders of the defined types were placed in this encounter.  No orders of the defined types were placed in this encounter.   There are no Patient Instructions on file for this visit.   Signed, Mekaila Tarnow [place], MD  10/12/2023 3:56 PM    [facility]

## 2023-10-08 NOTE — Progress Notes (Signed)
 Cardiology Office Note:    Date:  10/12/2023   ID:  Laura Gentry, Laura Gentry 1956/04/11, MRN [id number]  PCP:  Merl Star, MD   [facility] Providers Cardiologist:  None     Referring MD: Merl Star, MD   Chief Complaint  Patient presents with   Shortness of Breath    History of Present Illness:    Laura Gentry is a 68 y.o. female seen for follow up SVT. She was recently admitted with acute GI illness related to Norovirus. Went into SVT that broke with IV adenosine . Troponin increased to 118. Echo done showing normal overall LV function. There was some question of basal anterior HK but in my review it was a difficult area to assess due to Echo drop out and difficulty seeing the endocardial border. History of remote cerebral aneurysm clipping. History of orthostatic hypotension on midodrine . Was taking propranolol  chronically for tremor but this was discontinued.   On follow up today she reports that since this illness she has been more SOB. She also reports heaviness in her chest. She is experiencing more tachycardia. She also has dizziness that may or may not be related to her tachycardia.   Past Medical History:  Diagnosis Date   Brain aneurysm 03/19/2013   Cataracts, bilateral    Cerebral aneurysm    Colon polyps    Diabetes (HCC) 2013   High cholesterol    History of kidney stones    Insomnia    Lumbosacral radiculopathy    Osteoporosis    Skin cancer    squamous   Thyroid  disease    Tremor     Past Surgical History:  Procedure Laterality Date   APPENDECTOMY     BRAIN SURGERY  03/19/2013   Sugita aneyrsum clip can have MRI up to 4T docs scanned in    BREAST BIOPSY Right    CHOLECYSTECTOMY  2013   CYSTOSCOPY WITH RETROGRADE PYELOGRAM, URETEROSCOPY AND STENT PLACEMENT Bilateral 10/29/2021   Procedure: CYSTOSCOPY WITH RETROGRADE PYELOGRAM, URETEROSCOPY AND STENT PLACEMENT;  Surgeon: Osborn Blaze, MD;  Location: [facility];  Service: Urology;   Laterality: Bilateral;   CYSTOSCOPY WITH RETROGRADE PYELOGRAM, URETEROSCOPY AND STENT PLACEMENT Bilateral 11/19/2021   Procedure: CYSTOSCOPY WITH RETROGRADE PYELOGRAM, URETEROSCOPY AND STENT EXCHANGE, BASKET STONE REMOVAL;  Surgeon: Osborn Blaze, MD;  Location: [facility];  Service: Urology;  Laterality: Bilateral;  75 MINS   HIP PINNING,CANNULATED Left 11/04/2021   Procedure: CANNULATED HIP PINNING;  Surgeon: Wilhelmenia Harada, MD;  Location: [facility];  Service: Orthopedics;  Laterality: Left;   HOLMIUM LASER APPLICATION Bilateral 11/19/2021   Procedure: HOLMIUM LASER APPLICATION;  Surgeon: Osborn Blaze, MD;  Location: [facility];  Service: Urology;  Laterality: Bilateral;   INNER EAR SURGERY     Lost Hearing   TONSILLECTOMY AND ADENOIDECTOMY     VAGINAL HYSTERECTOMY     At age 64 due to "growth"    Current Medications: Current Meds  Medication Sig   acetaminophen  (TYLENOL ) 500 MG tablet Take 1 tablet (500 mg total) by mouth every 6 (six) hours as needed for mild pain or headache.   alendronate  (FOSAMAX ) 70 MG tablet Take 70 mg by mouth once a week.   atorvastatin  (LIPITOR) 40 MG tablet Take 1 tablet (40 mg total) by mouth daily. (Patient taking differently: Take 40 mg by mouth at bedtime.)   gabapentin  (NEURONTIN ) 300 MG capsule Take 300 mg in AM, 300 mg at noon, 600 mg at bedtime (Patient taking differently: Take 900 mg  by mouth at bedtime.)   insulin  aspart (NOVOLOG ) 100 UNIT/ML injection Inject 6-24 Units into the skin 3 (three) times daily before meals. Sliding scale: 100-140   = 6 units 141-180  =  10 units 181-220  =  12 units 221-260  =  14 units 261-300  =  16 units 301-340  =  18 units 341-380  =  20 units 381 > = 24 units   insulin  glargine (LANTUS  SOLOSTAR) 100 UNIT/ML Solostar Pen Inject 32 Units into the skin at bedtime. (Patient taking differently: Inject 45 Units into the skin at bedtime.)   midodrine  (PROAMATINE ) 5 MG tablet Take 1 tablet (5 mg total) by mouth 3 (three)  times daily with meals. (Patient taking differently: Take 5 mg by mouth 2 (two) times daily with a meal.)   propranolol  (INDERAL ) 10 MG tablet Take 10 mg by mouth 3 (three) times daily.   Semaglutide,0.25 or 0.5MG /DOS, (OZEMPIC, 0.25 OR 0.5 MG/DOSE,) 2 MG/3ML SOPN Inject 0.5 mg into the skin once a week. Inject on Monday   sertraline  (ZOLOFT ) 25 MG tablet Take 25 mg by mouth at bedtime.   thiamine  (VITAMIN B-1) 100 MG tablet Take 100 mg by mouth daily.   vitamin B-12 (CYANOCOBALAMIN ) 1000 MCG tablet Take 1,000 mcg by mouth daily.   zolpidem  (AMBIEN ) 5 MG tablet Take 5 mg by mouth at bedtime as needed.     Allergies:   Amoxicillin, Bee venom, Penicillins, Pneumococcal vaccines, Sulfa  antibiotics, and Bactrim  [sulfamethoxazole -trimethoprim ]   Social History   Socioeconomic History   Marital status: Divorced    Spouse name: Not on file   Number of children: 0   Years of education: Not on file   Highest education level: Some college, no degree  Occupational History   Occupation: Catering manager: [employer]    Comment: disabled  Tobacco Use   Smoking status: Former    Current packs/day: 0.00    Average packs/day: 0.5 packs/day for 40.0 years (20.0 ttl pk-yrs)    Types: Cigarettes    Start date: 05/26/1971    Quit date: 05/26/2011    Years since quitting: 12.3   Smokeless tobacco: Never  Vaping Use   Vaping status: Never Used  Substance and Sexual Activity   Alcohol use: No    Alcohol/week: 0.0 standard drinks of alcohol   Drug use: No   Sexual activity: Never  Other Topics Concern   Not on file  Social History Narrative   09/08/21 her mom lives with her   Caffeine-tea   Social Drivers of Health   Financial Resource Strain: Not on file  Food Insecurity: No Food Insecurity (09/13/2023)   Hunger Vital Sign    Worried About Running Out of Food in the Last Year: Never true    Ran Out of Food in the Last Year: Never true  Transportation Needs: No  Transportation Needs (09/13/2023)   PRAPARE - Administrator, Civil Service (Medical): No    Lack of Transportation (Non-Medical): No  Physical Activity: Not on file  Stress: Not on file  Social Connections: Unknown (09/13/2023)   Social Connection and Isolation Panel [NHANES]    Frequency of Communication with Friends and Family: Once a week    Frequency of Social Gatherings with Friends and Family: Once a week    Attends Religious Services: 1 to 4 times per year    Active Member of [employer] or Organizations: Not on file  Attends Banker Meetings: 1 to 4 times per year    Marital Status: Not on file     Family History: The patient's family history includes Allergies in her mother; Asthma in her mother; Breast cancer in her maternal aunt, maternal aunt, and paternal aunt; Diabetes in her maternal grandfather and mother; Emphysema in her maternal aunt; Hypertension in her maternal grandfather; Lung cancer in her father; Pancreatic cancer in her brother; Throat cancer in her father.  ROS:   Please see the history of present illness.     All other systems reviewed and are negative.  EKGs/Labs/Other Studies Reviewed:    The following studies were reviewed today: Echo 09/13/23: IMPRESSIONS     1. Left ventricular ejection fraction, by estimation, is 55 to 60%. The  left ventricle has normal function. The left ventricle demonstrates  regional wall motion abnormalities with possible basal anterolateral  hypokinesis. Left ventricular diastolic  parameters are consistent with Grade I diastolic dysfunction (impaired  relaxation).   2. Right ventricular systolic function is normal. The right ventricular  size is normal. Tricuspid regurgitation signal is inadequate for assessing  PA pressure.   3. Left atrial size was mildly dilated.   4. The mitral valve is normal in structure. No evidence of mitral valve  regurgitation. No evidence of mitral stenosis.   5. The  aortic valve is tricuspid. There is mild calcification of the  aortic valve. Aortic valve regurgitation is not visualized. No aortic  stenosis is present.   6. The inferior vena cava is normal in size with greater than 50%  respiratory variability, suggesting right atrial pressure of 3 mmHg.        Recent Labs: 09/12/2023: ALT 16; Hemoglobin 12.7; Platelets 281 09/13/2023: TSH 1.616 09/14/2023: BUN 10; Creatinine, Ser 0.71; Magnesium  1.8; Potassium 4.3; Sodium 137  Recent Lipid Panel    Component Value Date/Time   CHOL 158 10/29/2021 1008   CHOL 192 01/31/2018 1449   TRIG 174 (H) 10/29/2021 1008   HDL 29 (L) 10/29/2021 1008   HDL 26 (L) 01/31/2018 1449   CHOLHDL 5.4 10/29/2021 1008   VLDL 35 10/29/2021 1008   LDLCALC 94 10/29/2021 1008   LDLCALC 134 (H) 01/31/2018 1449   LDLDIRECT 120.0 09/26/2015 1226     Risk Assessment/Calculations:                Physical Exam:    VS:  BP 100/64   Pulse 94   Ht 5\' 6"  (1.676 m)   Wt 161 lb 9.6 oz (73.3 kg)   SpO2 92%   BMI 26.08 kg/m     Wt Readings from Last 3 Encounters:  10/12/23 161 lb 9.6 oz (73.3 kg)  09/12/23 161 lb (73 kg)  01/21/22 139 lb 1.8 oz (63.1 kg)     GEN:  Well nourished, well developed in no acute distress HEENT: Normal NECK: No JVD; No carotid bruits LYMPHATICS: No lymphadenopathy CARDIAC: RRR, no murmurs, rubs, gallops RESPIRATORY:  Clear to auscultation without rales, wheezing or rhonchi  ABDOMEN: Soft, non-tender, non-distended MUSCULOSKELETAL:  No edema; No deformity  SKIN: Warm and dry NEUROLOGIC:  Alert and oriented x 3 PSYCHIATRIC:  Normal affect   ASSESSMENT:    1. Dyspnea on exertion   2. Chest pain, precordial   3. SVT (supraventricular tachycardia) (HCC)    PLAN:    In order of problems listed above:  DOE and chest heaviness. Echo in the hospital was [facility] but concerned about CAD given her  longstanding DM. Will arrange for coronary CTA. Antianginal therapy limited by low BP. Will  await CT results SVT with persistent episodes of tachycardia. Therapy limited by low BP and orthostasis. Will place a 2 week event monitor. May need to consider ablation of her SVT DM Orthostatic hypotension. On midodrine . Recommend liberal salt intake and maintain good hydration. Recommend compression hose. Raise head of bed to activate baroreceptors.        Follow up after above studies   Medication Adjustments/Labs and Tests Ordered: Current medicines are reviewed at length with the patient today.  Concerns regarding medicines are outlined above.  No orders of the defined types were placed in this encounter.  No orders of the defined types were placed in this encounter.   There are no Patient Instructions on file for this visit.   Signed, Mekaila Tarnow Swaziland, MD  10/12/2023 3:56 PM    [facility]

## 2023-10-12 ENCOUNTER — Other Ambulatory Visit: Payer: Self-pay | Admitting: Cardiology

## 2023-10-12 ENCOUNTER — Ambulatory Visit

## 2023-10-12 ENCOUNTER — Ambulatory Visit: Attending: Cardiology | Admitting: Cardiology

## 2023-10-12 ENCOUNTER — Encounter: Payer: Self-pay | Admitting: Cardiology

## 2023-10-12 VITALS — BP 100/64 | HR 94 | Ht 66.0 in | Wt 161.6 lb

## 2023-10-12 DIAGNOSIS — R072 Precordial pain: Secondary | ICD-10-CM

## 2023-10-12 DIAGNOSIS — I471 Supraventricular tachycardia, unspecified: Secondary | ICD-10-CM

## 2023-10-12 DIAGNOSIS — R0609 Other forms of dyspnea: Secondary | ICD-10-CM | POA: Diagnosis not present

## 2023-10-12 MED ORDER — METOPROLOL TARTRATE 100 MG PO TABS: ORAL_TABLET | ORAL | 0 refills | Status: DC

## 2023-10-12 NOTE — Patient Instructions (Addendum)
 Medication Instructions:  Continue same medications *If you need a refill on your cardiac medications before your next appointment, please call your pharmacy*  Lab Work: None ordered  Testing/Procedures: Coronary CT  will be scheduled after approved by insurance    Follow instructions below    2 week heart monitor  will be mailed to your home with instructions  Follow-Up: At [facility], you and your health needs are our priority.  As part of our continuing mission to provide you with exceptional heart care, our providers are all part of one team.  This team includes your primary Cardiologist (physician) and Advanced Practice Providers or APPs (Physician Assistants and Nurse Practitioners) who all work together to provide you with the care you need, when you need it.  Your next appointment:  After test      Friday 6/27 at 3:20 pm    Provider:  Dr.Jordan        Increase Salt intake Hydrate  Wear compression stockings during day Elevate head of bed    Your cardiac CT will be scheduled at one of the below locations:   [facility] [address] [place], [place] [address] [phone]  OR  [facility] [address] [place], [place] [address] [phone]  OR   [facility] [address] [place], [place] [address] [phone]  OR   [facility] [address] [place], [place] [address] [phone]  OR   [facility] and V[facility] Tower [address]  [place], [place] [address] Opening September 20, 2023  If scheduled at [facility], please arrive at the [facility] and Children's Entrance (Entrance C2) of [facility] 30 minutes prior to test start time. You can use the FREE valet parking offered at entrance C (encouraged to control the heart rate for the test)  Proceed to the [facility] Radiology Department (first floor) to check-in  and test prep.   All radiology patients and guests should use entrance C2 at [facility], accessed from [facility], even though the hospital's physical address listed is [address]    If scheduled at the Heart and Vascular Tower at [address], please enter the parking lot using the [address] entrance and use the FREE valet service at the patient drop-off area. Enter the buidling and check-in with registration on the main floor.  If scheduled at [facility] or [facility], please arrive 15 mins early for check-in and test prep.  There is spacious parking and easy access to the radiology department from the [facility] Heart and Vascular entrance. Please enter here and check-in with the desk attendant.   If scheduled at [facility], please arrive 30 minutes early for check-in and test prep.  Please follow these instructions carefully (unless otherwise directed):  An IV will be required for this test and Nitroglycerin will be given.  Hold all erectile dysfunction medications at least 3 days (72 hrs) prior to test. (Ie viagra, cialis, sildenafil, tadalafil, etc)   On the Night Before the Test: Be sure to Drink plenty of water. Do not consume any caffeinated/decaffeinated beverages or chocolate 12 hours prior to your test. Do not take any antihistamines 12 hours prior to your test.   On the Day of the Test: Drink plenty of water until 1 hour prior to the test. Do not eat any food 1  hour prior to test. You may take your regular medications prior to the test.  Take metoprolol 100 mg two hours prior to test. If you take Furosemide/Hydrochlorothiazide/Spironolactone/Chlorthalidone, please HOLD on the morning of the test. Patients who wear a continuous glucose monitor MUST remove the device prior to scanning. FEMALES- please wear underwire-free bra if available, avoid dresses & tight clothing         After the Test: Drink plenty of water. After receiving IV contrast, you may experience a mild flushed feeling. This is normal. On occasion, you may experience a mild rash up to 24 hours after the test. This is not dangerous. If this occurs, you can take Benadryl  25 mg, Zyrtec, Claritin, or Allegra and increase your fluid intake. (Patients taking Tikosyn should avoid Benadryl , and may take Zyrtec, Claritin, or Allegra) If you experience trouble breathing, this can be serious. If it is severe call 911 IMMEDIATELY. If it is mild, please call our office.  We will call to schedule your test 2-4 weeks out understanding that some insurance companies will need an authorization prior to the service being performed.   For more information and frequently asked questions, please visit our website : [email or web address]  For non-scheduling related questions, please contact the cardiac imaging nurse navigator should you have any questions/concerns: Cardiac Imaging Nurse Navigators Direct Office Dial: [phone]   For scheduling needs, including cancellations and rescheduling, please call [place], [phone].  We recommend signing up for the patient portal called "MyChart".  Sign up information is provided on this After Visit Summary.  MyChart is used to connect with patients for Virtual Visits (Telemedicine).  Patients are able to view lab/test results, encounter notes, upcoming appointments, etc.  Non-urgent messages can be sent to your provider as well.   To learn more about what you can do with MyChart, go to [email or web address].

## 2023-10-12 NOTE — Addendum Note (Signed)
 Addended by: Sherryn Donalds on: 10/12/2023 04:34 PM   Modules accepted: Orders

## 2023-10-12 NOTE — Progress Notes (Unsigned)
 Enrolled for [employer] to mail a ZIO XT long term holter monitor to the patients address on file.

## 2023-10-22 ENCOUNTER — Other Ambulatory Visit (HOSPITAL_COMMUNITY)

## 2023-10-26 DIAGNOSIS — Z1331 Encounter for screening for depression: Secondary | ICD-10-CM | POA: Diagnosis not present

## 2023-10-26 DIAGNOSIS — E1165 Type 2 diabetes mellitus with hyperglycemia: Secondary | ICD-10-CM | POA: Diagnosis not present

## 2023-10-26 DIAGNOSIS — E7800 Pure hypercholesterolemia, unspecified: Secondary | ICD-10-CM | POA: Diagnosis not present

## 2023-10-26 DIAGNOSIS — Z0000 Encounter for general adult medical examination without abnormal findings: Secondary | ICD-10-CM | POA: Diagnosis not present

## 2023-10-26 DIAGNOSIS — I671 Cerebral aneurysm, nonruptured: Secondary | ICD-10-CM | POA: Diagnosis not present

## 2023-10-26 DIAGNOSIS — Z23 Encounter for immunization: Secondary | ICD-10-CM | POA: Diagnosis not present

## 2023-10-26 DIAGNOSIS — M810 Age-related osteoporosis without current pathological fracture: Secondary | ICD-10-CM | POA: Diagnosis not present

## 2023-10-26 DIAGNOSIS — Z5181 Encounter for therapeutic drug level monitoring: Secondary | ICD-10-CM | POA: Diagnosis not present

## 2023-10-26 DIAGNOSIS — R251 Tremor, unspecified: Secondary | ICD-10-CM | POA: Diagnosis not present

## 2023-10-26 DIAGNOSIS — I951 Orthostatic hypotension: Secondary | ICD-10-CM | POA: Diagnosis not present

## 2023-10-26 DIAGNOSIS — Z794 Long term (current) use of insulin: Secondary | ICD-10-CM | POA: Diagnosis not present

## 2023-10-26 DIAGNOSIS — I700 Atherosclerosis of aorta: Secondary | ICD-10-CM | POA: Diagnosis not present

## 2023-10-27 ENCOUNTER — Ambulatory Visit
Admission: RE | Admit: 2023-10-27 | Discharge: 2023-10-27 | Disposition: A | Discharge status: Home / Self Care | Source: Ambulatory Visit | Attending: Internal Medicine | Admitting: Internal Medicine

## 2023-10-27 DIAGNOSIS — Z1231 Encounter for screening mammogram for malignant neoplasm of breast: Secondary | ICD-10-CM

## 2023-10-29 ENCOUNTER — Telehealth (HOSPITAL_COMMUNITY): Payer: Self-pay | Admitting: Emergency Medicine

## 2023-10-29 MED ORDER — IVABRADINE HCL 5 MG PO TABS: 15.0000 mg | ORAL_TABLET | Freq: Once | ORAL | 0 refills | Status: AC

## 2023-10-29 NOTE — Telephone Encounter (Signed)
 Reaching out to patient to offer assistance regarding upcoming cardiac imaging study; pt verbalizes understanding of appt date/time, parking situation and where to check in, pre-test NPO status and medications ordered, and verified current allergies; name and call back number provided for further questions should they arise [place]well [place] RN Navigator Cardiac Imaging [facility] [phone] office [phone] cell

## 2023-11-01 ENCOUNTER — Ambulatory Visit (HOSPITAL_COMMUNITY)
Admission: RE | Admit: 2023-11-01 | Discharge: 2023-11-01 | Disposition: A | Discharge status: Home / Self Care | Source: Ambulatory Visit | Attending: Cardiovascular Disease | Admitting: Cardiovascular Disease

## 2023-11-01 DIAGNOSIS — I2510 Atherosclerotic heart disease of native coronary artery without angina pectoris: Secondary | ICD-10-CM

## 2023-11-01 DIAGNOSIS — R0609 Other forms of dyspnea: Secondary | ICD-10-CM | POA: Diagnosis not present

## 2023-11-01 DIAGNOSIS — I700 Atherosclerosis of aorta: Secondary | ICD-10-CM | POA: Insufficient documentation

## 2023-11-01 DIAGNOSIS — R072 Precordial pain: Secondary | ICD-10-CM | POA: Diagnosis not present

## 2023-11-01 DIAGNOSIS — R931 Abnormal findings on diagnostic imaging of heart and coronary circulation: Secondary | ICD-10-CM | POA: Insufficient documentation

## 2023-11-01 DIAGNOSIS — I471 Supraventricular tachycardia, unspecified: Secondary | ICD-10-CM | POA: Diagnosis not present

## 2023-11-01 MED ORDER — IOHEXOL 350 MG/ML SOLN
100.0000 mL | Freq: Once | INTRAVENOUS | Status: AC | PRN
  Administered 2023-11-01: 100 mL via INTRAVENOUS

## 2023-11-01 MED ORDER — METOPROLOL TARTRATE 5 MG/5ML IV SOLN: 10.0000 mg | Freq: Once | INTRAVENOUS | Status: DC | PRN

## 2023-11-01 MED ORDER — NITROGLYCERIN 0.4 MG SL SUBL
0.8000 mg | SUBLINGUAL_TABLET | Freq: Once | SUBLINGUAL | Status: AC
  Administered 2023-11-01: 0.8 mg via SUBLINGUAL

## 2023-11-01 MED ORDER — DILTIAZEM HCL 25 MG/5ML IV SOLN: 10.0000 mg | INTRAVENOUS | Status: DC | PRN

## 2023-11-02 ENCOUNTER — Ambulatory Visit (HOSPITAL_BASED_OUTPATIENT_CLINIC_OR_DEPARTMENT_OTHER)
Admission: RE | Admit: 2023-11-02 | Discharge: 2023-11-02 | Disposition: A | Discharge status: Home / Self Care | Source: Ambulatory Visit | Attending: Cardiovascular Disease

## 2023-11-02 ENCOUNTER — Other Ambulatory Visit: Payer: Self-pay | Admitting: Cardiovascular Disease

## 2023-11-02 DIAGNOSIS — I2510 Atherosclerotic heart disease of native coronary artery without angina pectoris: Secondary | ICD-10-CM

## 2023-11-02 DIAGNOSIS — R0609 Other forms of dyspnea: Secondary | ICD-10-CM

## 2023-11-02 DIAGNOSIS — R931 Abnormal findings on diagnostic imaging of heart and coronary circulation: Secondary | ICD-10-CM

## 2023-11-02 DIAGNOSIS — I471 Supraventricular tachycardia, unspecified: Secondary | ICD-10-CM | POA: Diagnosis not present

## 2023-11-02 DIAGNOSIS — R072 Precordial pain: Secondary | ICD-10-CM | POA: Diagnosis not present

## 2023-11-02 DIAGNOSIS — I700 Atherosclerosis of aorta: Secondary | ICD-10-CM | POA: Diagnosis not present

## 2023-11-04 ENCOUNTER — Ambulatory Visit: Payer: Self-pay | Admitting: Cardiology

## 2023-11-04 ENCOUNTER — Telehealth: Payer: Self-pay

## 2023-11-04 DIAGNOSIS — R072 Precordial pain: Secondary | ICD-10-CM

## 2023-11-04 DIAGNOSIS — I2510 Atherosclerotic heart disease of native coronary artery without angina pectoris: Secondary | ICD-10-CM

## 2023-11-04 DIAGNOSIS — R0609 Other forms of dyspnea: Secondary | ICD-10-CM | POA: Diagnosis not present

## 2023-11-04 DIAGNOSIS — Z01812 Encounter for preprocedural laboratory examination: Secondary | ICD-10-CM

## 2023-11-04 DIAGNOSIS — I471 Supraventricular tachycardia, unspecified: Secondary | ICD-10-CM

## 2023-11-04 NOTE — Telephone Encounter (Addendum)
  [facility] [facility] A DEPT OF [facility] AT [address] A DEPT OF THE [place] [address] [place] [address] Dept: [phone] Loc: [phone]  Laura Gentry  11/04/2023  You are scheduled for a Cardiac Catheterization on Tuesday, June 17 with Dr. Peter [place].  1. Please arrive at the [facility] (Main Entrance A) at [facility]: [address] [place], [place] [address] at 10:00 AM (This time is 2 hour(s) before your procedure to ensure your preparation).   Free valet parking service is available. You will check in at ADMITTING. The support person will be asked to wait in the waiting room.  It is OK to have someone drop you off and come back when you are ready to be discharged.    Special note: Every effort is made to have your procedure done on time. Please understand that emergencies sometimes delay scheduled procedures.  2. Diet: Do not eat solid foods after midnight.  The patient may have clear liquids until 5am upon the day of the procedure.  3. Labs: You will need to have blood drawn on Fri 6/13 at [employer] [employer] at Dr.Jordan's office.You do not need to be fasting.  4. Medication instructions in preparation for your procedure:   Hold Ozempic 7 days before cath  Take 1/2 Insulin  dose night before  Hold morning dose of Insulin   Drink 16 ounces of water on the way to hospital        On the morning of your procedure, take your Aspirin  81 mg and any morning medicines NOT listed above.  You may use sips of water.  5. Plan to go home the same day, you will only stay overnight if medically necessary. 6. Bring a current list of your medications and current insurance cards. 7. You MUST have a responsible person to drive you home. 8. Someone MUST be with you the first 24 hours after you arrive home or your discharge will be delayed. 9. Please wear clothes that are easy to get on and off and wear slip-on  shoes.  Thank you for allowing us  to care for you!   -- [place] [facility]

## 2023-11-05 DIAGNOSIS — I2510 Atherosclerotic heart disease of native coronary artery without angina pectoris: Secondary | ICD-10-CM | POA: Diagnosis not present

## 2023-11-05 DIAGNOSIS — Z01810 Encounter for preprocedural cardiovascular examination: Secondary | ICD-10-CM | POA: Diagnosis not present

## 2023-11-05 DIAGNOSIS — Z01812 Encounter for preprocedural laboratory examination: Secondary | ICD-10-CM | POA: Diagnosis not present

## 2023-11-06 LAB — BASIC METABOLIC PANEL WITH GFR
BUN/Creatinine Ratio: 19 (ref 12–28)
BUN: 15 mg/dL (ref 8–27)
CO2: 17 mmol/L — ABNORMAL LOW (ref 20–29)
Calcium: 9.3 mg/dL (ref 8.7–10.3)
Chloride: 103 mmol/L (ref 96–106)
Creatinine, Ser: 0.81 mg/dL (ref 0.57–1.00)
Glucose: 150 mg/dL — ABNORMAL HIGH (ref 70–99)
Potassium: 4.8 mmol/L (ref 3.5–5.2)
Sodium: 141 mmol/L (ref 134–144)
eGFR: 79 mL/min/1.73

## 2023-11-06 LAB — CBC WITH DIFFERENTIAL/PLATELET
Basophils Absolute: 0.1 10*3/uL (ref 0.0–0.2)
Basos: 1 %
EOS (ABSOLUTE): 0.2 10*3/uL (ref 0.0–0.4)
Eos: 2 %
Hematocrit: 41.6 % (ref 34.0–46.6)
Hemoglobin: 13.5 g/dL (ref 11.1–15.9)
Immature Grans (Abs): 0 10*3/uL (ref 0.0–0.1)
Immature Granulocytes: 0 %
Lymphocytes Absolute: 2 10*3/uL (ref 0.7–3.1)
Lymphs: 24 %
MCH: 26.1 pg — ABNORMAL LOW (ref 26.6–33.0)
MCHC: 32.5 g/dL (ref 31.5–35.7)
MCV: 80 fL (ref 79–97)
Monocytes Absolute: 0.5 10*3/uL (ref 0.1–0.9)
Monocytes: 6 %
Neutrophils Absolute: 5.6 10*3/uL (ref 1.4–7.0)
Neutrophils: 67 %
Platelets: 329 10*3/uL (ref 150–450)
RBC: 5.18 x10E6/uL (ref 3.77–5.28)
RDW: 12.7 % (ref 11.7–15.4)
WBC: 8.4 10*3/uL (ref 3.4–10.8)

## 2023-11-07 ENCOUNTER — Ambulatory Visit: Payer: Self-pay | Admitting: Cardiology

## 2023-11-08 ENCOUNTER — Other Ambulatory Visit: Payer: Self-pay | Admitting: Cardiology

## 2023-11-08 ENCOUNTER — Telehealth: Payer: Self-pay | Admitting: *Deleted

## 2023-11-08 DIAGNOSIS — R0609 Other forms of dyspnea: Secondary | ICD-10-CM

## 2023-11-08 NOTE — Telephone Encounter (Signed)
 Cardiac Catheterization scheduled at [facility] for: Tuesday November 09, 2023 12 Noon Arrival time [facility] Main Entrance A at: 10 AM  Nothing to eat after midnight prior to procedure, clear liquids until 5 AM day of procedure.  Medication instructions: -Hold:  Insulin -AM of procedure/1/2 usual dose HS prior to procedure  Ozempic-weekly on Thursdays-did not take    -Other usual morning medications can be taken with sips of water including aspirin  81 mg.  Plan to go home the same day, you will only stay overnight if medically necessary.  You must have responsible adult to drive you home.  Someone must be with you the first 24 hours after you arrive home.  Reviewed procedure instructions with patient.

## 2023-11-09 ENCOUNTER — Ambulatory Visit (HOSPITAL_COMMUNITY)
Admission: RE | Admit: 2023-11-09 | Discharge: 2023-11-09 | Disposition: A | Discharge status: Home / Self Care | Attending: Cardiology | Admitting: Cardiology

## 2023-11-09 ENCOUNTER — Other Ambulatory Visit: Payer: Self-pay

## 2023-11-09 ENCOUNTER — Encounter (HOSPITAL_COMMUNITY)
Admission: RE | Disposition: A | Discharge status: Home / Self Care | Payer: Self-pay | Source: Home / Self Care | Attending: Cardiology

## 2023-11-09 ENCOUNTER — Encounter (HOSPITAL_COMMUNITY): Payer: Self-pay | Admitting: Cardiology

## 2023-11-09 DIAGNOSIS — I25119 Atherosclerotic heart disease of native coronary artery with unspecified angina pectoris: Secondary | ICD-10-CM

## 2023-11-09 DIAGNOSIS — I951 Orthostatic hypotension: Secondary | ICD-10-CM | POA: Insufficient documentation

## 2023-11-09 DIAGNOSIS — R0609 Other forms of dyspnea: Secondary | ICD-10-CM

## 2023-11-09 DIAGNOSIS — Z794 Long term (current) use of insulin: Secondary | ICD-10-CM | POA: Diagnosis not present

## 2023-11-09 DIAGNOSIS — I471 Supraventricular tachycardia, unspecified: Secondary | ICD-10-CM | POA: Insufficient documentation

## 2023-11-09 DIAGNOSIS — Z87891 Personal history of nicotine dependence: Secondary | ICD-10-CM | POA: Diagnosis not present

## 2023-11-09 DIAGNOSIS — Z7985 Long-term (current) use of injectable non-insulin antidiabetic drugs: Secondary | ICD-10-CM | POA: Insufficient documentation

## 2023-11-09 DIAGNOSIS — E119 Type 2 diabetes mellitus without complications: Secondary | ICD-10-CM | POA: Diagnosis not present

## 2023-11-09 DIAGNOSIS — I209 Angina pectoris, unspecified: Secondary | ICD-10-CM | POA: Diagnosis present

## 2023-11-09 HISTORY — PX: LEFT HEART CATH AND CORONARY ANGIOGRAPHY: CATH118249

## 2023-11-09 LAB — GLUCOSE, CAPILLARY
Glucose-Capillary: 158 mg/dL — ABNORMAL HIGH (ref 70–99)
Glucose-Capillary: 120 mg/dL — ABNORMAL HIGH (ref 70–99)

## 2023-11-09 SURGERY — LEFT HEART CATH AND CORONARY ANGIOGRAPHY
Anesthesia: LOCAL

## 2023-11-09 MED ORDER — SODIUM CHLORIDE 0.9 % IV SOLN: INTRAVENOUS | Status: DC

## 2023-11-09 MED ORDER — HEPARIN (PORCINE) IN NACL 1000-0.9 UT/500ML-% IV SOLN
INTRAVENOUS | Status: DC | PRN
Start: 2023-11-09 — End: 2023-11-09
  Administered 2023-11-09: 1000 mL

## 2023-11-09 MED ORDER — IOHEXOL 350 MG/ML SOLN
INTRAVENOUS | Status: DC | PRN
  Administered 2023-11-09: 35 mL

## 2023-11-09 MED ORDER — SODIUM CHLORIDE 0.9% FLUSH: 3.0000 mL | Freq: Two times a day (BID) | INTRAVENOUS | Status: DC

## 2023-11-09 MED ORDER — ASPIRIN 81 MG PO CHEW: 81.0000 mg | CHEWABLE_TABLET | ORAL | Status: DC

## 2023-11-09 MED ORDER — MIDAZOLAM HCL 2 MG/2ML IJ SOLN
INTRAMUSCULAR | Status: AC
  Filled 2023-11-09: qty 2

## 2023-11-09 MED ORDER — VERAPAMIL HCL 2.5 MG/ML IV SOLN
INTRAVENOUS | Status: AC
  Filled 2023-11-09: qty 2

## 2023-11-09 MED ORDER — ONDANSETRON HCL 4 MG/2ML IJ SOLN: 4.0000 mg | Freq: Four times a day (QID) | INTRAMUSCULAR | Status: DC | PRN

## 2023-11-09 MED ORDER — SODIUM CHLORIDE 0.9 % IV SOLN: 250.0000 mL | INTRAVENOUS | Status: DC | PRN

## 2023-11-09 MED ORDER — LIDOCAINE HCL (PF) 1 % IJ SOLN
INTRAMUSCULAR | Status: DC | PRN
  Administered 2023-11-09: 2 mL

## 2023-11-09 MED ORDER — HYDRALAZINE HCL 20 MG/ML IJ SOLN: 10.0000 mg | INTRAMUSCULAR | Status: DC | PRN

## 2023-11-09 MED ORDER — SODIUM CHLORIDE 0.9% FLUSH: 3.0000 mL | INTRAVENOUS | Status: DC | PRN

## 2023-11-09 MED ORDER — ACETAMINOPHEN 325 MG PO TABS
650.0000 mg | ORAL_TABLET | ORAL | Status: DC | PRN
Start: 2023-11-09 — End: 2023-11-09

## 2023-11-09 MED ORDER — LABETALOL HCL 5 MG/ML IV SOLN: 10.0000 mg | INTRAVENOUS | Status: DC | PRN

## 2023-11-09 MED ORDER — RANOLAZINE ER 500 MG PO TB12: 500.0000 mg | ORAL_TABLET | Freq: Two times a day (BID) | ORAL | 3 refills | Status: DC

## 2023-11-09 MED ORDER — FENTANYL CITRATE (PF) 100 MCG/2ML IJ SOLN
INTRAMUSCULAR | Status: DC | PRN
  Administered 2023-11-09: 25 ug via INTRAVENOUS

## 2023-11-09 MED ORDER — LIDOCAINE HCL (PF) 1 % IJ SOLN
INTRAMUSCULAR | Status: AC
  Filled 2023-11-09: qty 30

## 2023-11-09 MED ORDER — FENTANYL CITRATE (PF) 100 MCG/2ML IJ SOLN
INTRAMUSCULAR | Status: AC
Start: 2023-11-09 — End: 2023-11-09
  Filled 2023-11-09: qty 2

## 2023-11-09 MED ORDER — HEPARIN SODIUM (PORCINE) 1000 UNIT/ML IJ SOLN
INTRAMUSCULAR | Status: AC
  Filled 2023-11-09: qty 10

## 2023-11-09 MED ORDER — VERAPAMIL HCL 2.5 MG/ML IV SOLN
INTRAVENOUS | Status: DC | PRN
  Administered 2023-11-09: 10 mL via INTRA_ARTERIAL

## 2023-11-09 MED ORDER — HEPARIN SODIUM (PORCINE) 1000 UNIT/ML IJ SOLN
INTRAMUSCULAR | Status: DC | PRN
  Administered 2023-11-09: 4000 [IU] via INTRAVENOUS

## 2023-11-09 MED ORDER — MIDAZOLAM HCL 2 MG/2ML IJ SOLN
INTRAMUSCULAR | Status: DC | PRN
  Administered 2023-11-09: 1 mg via INTRAVENOUS

## 2023-11-09 SURGICAL SUPPLY — 8 items
CATH 5FR JL3.5 JR4 ANG PIG MP (CATHETERS) IMPLANT
DEVICE RAD COMP TR BAND LRG (VASCULAR PRODUCTS) IMPLANT
ELECT DEFIB PAD ADLT CADENCE (PAD) IMPLANT
GLIDESHEATH SLEND SS 6F .021 (SHEATH) IMPLANT
GUIDEWIRE INQWIRE 1.5J.035X260 (WIRE) IMPLANT
KIT SYRINGE INJ CVI SPIKEX1 (MISCELLANEOUS) IMPLANT
PACK CARDIAC CATHETERIZATION (CUSTOM PROCEDURE TRAY) ×2 IMPLANT
SET ATX-X65L (MISCELLANEOUS) IMPLANT

## 2023-11-09 NOTE — Discharge Instructions (Signed)

## 2023-11-09 NOTE — Interval H&P Note (Signed)
 History and Physical Interval Note:  11/09/2023 12:22 PM  Laura Gentry  has presented today for surgery, with the diagnosis of abnormal coronary ct.  The various methods of treatment have been discussed with the patient and family. After consideration of risks, benefits and other options for treatment, the patient has consented to  Procedure(s): LEFT HEART CATH AND CORONARY ANGIOGRAPHY (N/A) as a surgical intervention.  The patient's history has been reviewed, patient examined, no change in status, stable for surgery.  I have reviewed the patient's chart and labs.  Questions were answered to the patient's satisfaction.   Cath Lab Visit (complete for each Cath Lab visit)  Clinical Evaluation Leading to the Procedure:   ACS: No.  Non-ACS:    Anginal Classification: CCS II  Anti-ischemic medical therapy: Minimal Therapy (1 class of medications)  Non-Invasive Test Results: Intermediate-risk stress test findings: cardiac mortality 1-3%/year  Prior CABG: No previous CABG        Laura Gentry [facility] 11/09/2023 12:22 PM

## 2023-11-09 NOTE — Progress Notes (Signed)
Patient and sister was given discharge instructions. Both verbalized understanding. 

## 2023-11-16 NOTE — Progress Notes (Signed)
 Cardiology Office Note:    Date:  11/19/2023   ID:  Laura Gentry, DOB [birth year], MRN [id number]  PCP:  Rexanne Ingle, MD   [facility] Providers Cardiologist:  None     Referring MD: Rexanne Ingle, MD   No chief complaint on file.   History of Present Illness:    Laura Gentry is a 68 y.o. female seen for follow up SVT. She was recently admitted with acute GI illness related to Norovirus. Went into SVT that broke with IV adenosine . Troponin increased to 118. Echo done showing normal overall LV function. There was some question of basal anterior HK but in my review it was a difficult area to assess due to Echo drop out and difficulty seeing the endocardial border. History of remote cerebral aneurysm clipping. History of orthostatic hypotension on midodrine . Was taking propranolol  chronically for tremor but this was discontinued.   We performed event monitor showing no significant arrhythmia. CTA suggested significant LAD stenosis but on cath only obstruction in small diagonal branch. Clinically improved on Ranexa .     Past Medical History:  Diagnosis Date   Brain aneurysm 03/19/2013   Cataracts, bilateral    Cerebral aneurysm    Colon polyps    Diabetes (HCC) 2013   High cholesterol    History of kidney stones    Insomnia    Lumbosacral radiculopathy    Osteoporosis    Skin cancer    squamous   Thyroid  disease    Tremor     Past Surgical History:  Procedure Laterality Date   APPENDECTOMY     BRAIN SURGERY  03/19/2013   Sugita aneyrsum clip can have MRI up to 4T docs scanned in    BREAST BIOPSY Right    CHOLECYSTECTOMY  2013   CYSTOSCOPY WITH RETROGRADE PYELOGRAM, URETEROSCOPY AND STENT PLACEMENT Bilateral 10/29/2021   Procedure: CYSTOSCOPY WITH RETROGRADE PYELOGRAM, URETEROSCOPY AND STENT PLACEMENT;  Surgeon: Alvaro Hummer, MD;  Location: [facility];  Service: Urology;  Laterality: Bilateral;   CYSTOSCOPY WITH RETROGRADE PYELOGRAM, URETEROSCOPY AND  STENT PLACEMENT Bilateral 11/19/2021   Procedure: CYSTOSCOPY WITH RETROGRADE PYELOGRAM, URETEROSCOPY AND STENT EXCHANGE, BASKET STONE REMOVAL;  Surgeon: Alvaro Hummer, MD;  Location: [facility];  Service: Urology;  Laterality: Bilateral;  75 MINS   HIP PINNING,CANNULATED Left 11/04/2021   Procedure: CANNULATED HIP PINNING;  Surgeon: Genelle Standing, MD;  Location: [facility];  Service: Orthopedics;  Laterality: Left;   HOLMIUM LASER APPLICATION Bilateral 11/19/2021   Procedure: HOLMIUM LASER APPLICATION;  Surgeon: Alvaro Hummer, MD;  Location: [facility];  Service: Urology;  Laterality: Bilateral;   INNER EAR SURGERY     Lost Hearing   LEFT HEART CATH AND CORONARY ANGIOGRAPHY N/A 11/09/2023   Procedure: LEFT HEART CATH AND CORONARY ANGIOGRAPHY;  Surgeon: [place], Vaishali Baise M, MD;  Location: [facility] INVASIVE CV LAB;  Service: Cardiovascular;  Laterality: N/A;   TONSILLECTOMY AND ADENOIDECTOMY     VAGINAL HYSTERECTOMY     At age 22 due to growth    Current Medications: Current Meds  Medication Sig   acetaminophen  (TYLENOL ) 500 MG tablet Take 1 tablet (500 mg total) by mouth every 6 (six) hours as needed for mild pain or headache.   alendronate  (FOSAMAX ) 70 MG tablet Take 70 mg by mouth every Monday.   atorvastatin  (LIPITOR) 40 MG tablet Take 1 tablet (40 mg total) by mouth daily. (Patient taking differently: Take 40 mg by mouth at bedtime.)   Continuous Glucose Receiver (FREESTYLE LIBRE 3 READER) DEVI  1 Application by Does not apply route.   gabapentin  (NEURONTIN ) 300 MG capsule Take 300 mg in AM, 300 mg at noon, 600 mg at bedtime (Patient taking differently: Take 900 mg by mouth at bedtime.)   insulin  aspart (NOVOLOG ) 100 UNIT/ML injection Inject 6-24 Units into the skin 3 (three) times daily before meals. Sliding scale:less than 100  Do not Take 100-140   = 6 units 141-180  =  10 units 181-220  =  12 units 221-260  =  14 units 261-300  =  16 units 301-340  =  18 units 341-380  =  20 units 381 > = 24  units   insulin  glargine (LANTUS  SOLOSTAR) 100 UNIT/ML Solostar Pen Inject 32 Units into the skin at bedtime. (Patient taking differently: Inject 40-50 Units into the skin See admin instructions. Above 150 take 50 units Below 150 take 40 units at bedtime)   midodrine  (PROAMATINE ) 5 MG tablet Take 1 tablet (5 mg total) by mouth 3 (three) times daily with meals. (Patient taking differently: Take 5 mg by mouth 2 (two) times daily with a meal.)   POTASSIUM GLUCONATE PO Take 1 tablet by mouth daily.   ranolazine  (RANEXA ) 500 MG 12 hr tablet Take 1 tablet (500 mg total) by mouth 2 (two) times daily.   Semaglutide,0.25 or 0.5MG /DOS, (OZEMPIC, 0.25 OR 0.5 MG/DOSE,) 2 MG/3ML SOPN Inject 0.5 mg into the skin every Thursday.   sertraline  (ZOLOFT ) 25 MG tablet Take 25 mg by mouth at bedtime.   thiamine  (VITAMIN B-1) 100 MG tablet Take 100 mg by mouth daily.   vitamin B-12 (CYANOCOBALAMIN ) 1000 MCG tablet Take 1,000 mcg by mouth daily.   zolpidem  (AMBIEN ) 5 MG tablet Take 5 mg by mouth at bedtime as needed.     Allergies:   Amoxicillin, Bee venom, Penicillins, Pneumococcal vaccines, Sulfa  antibiotics, and Bactrim  [sulfamethoxazole -trimethoprim ]   Social History   Socioeconomic History   Marital status: Divorced    Spouse name: Not on file   Number of children: 0   Years of education: Not on file   Highest education level: Some college, no degree  Occupational History   Occupation: Catering manager: [employer]    Comment: disabled  Tobacco Use   Smoking status: Former    Current packs/day: 0.00    Average packs/day: 0.5 packs/day for 40.0 years (20.0 ttl pk-yrs)    Types: Cigarettes    Start date: 05/26/1971    Quit date: 05/26/2011    Years since quitting: 12.4   Smokeless tobacco: Never  Vaping Use   Vaping status: Never Used  Substance and Sexual Activity   Alcohol use: No    Alcohol/week: 0.0 standard drinks of alcohol   Drug use: No   Sexual activity: Never   Other Topics Concern   Not on file  Social History Narrative   09/08/21 her mom lives with her   Caffeine-tea   Social Drivers of Health   Financial Resource Strain: Not on file  Food Insecurity: No Food Insecurity (09/13/2023)   Hunger Vital Sign    Worried About Running Out of Food in the Last Year: Never true    Ran Out of Food in the Last Year: Never true  Transportation Needs: No Transportation Needs (09/13/2023)   PRAPARE - Administrator, Civil Service (Medical): No    Lack of Transportation (Non-Medical): No  Physical Activity: Not on file  Stress: Not on file  Social Connections:  Unknown (09/13/2023)   Social Connection and Isolation Panel    Frequency of Communication with Friends and Family: Once a week    Frequency of Social Gatherings with Friends and Family: Once a week    Attends Religious Services: 1 to 4 times per year    Active Member of [employer] or Organizations: Not on file    Attends Banker Meetings: 1 to 4 times per year    Marital Status: Not on file     Family History: The patient's family history includes Allergies in her mother; Asthma in her mother; Breast cancer in her maternal aunt, maternal aunt, and paternal aunt; Diabetes in her maternal grandfather and mother; Emphysema in her maternal aunt; Hypertension in her maternal grandfather; Lung cancer in her father; Pancreatic cancer in her brother; Throat cancer in her father.  ROS:   Please see the history of present illness.     All other systems reviewed and are negative.  EKGs/Labs/Other Studies Reviewed:    The following studies were reviewed today: Echo 09/13/23: IMPRESSIONS     1. Left ventricular ejection fraction, by estimation, is 55 to 60%. The  left ventricle has normal function. The left ventricle demonstrates  regional wall motion abnormalities with possible basal anterolateral  hypokinesis. Left ventricular diastolic  parameters are consistent with Grade I  diastolic dysfunction (impaired  relaxation).   2. Right ventricular systolic function is normal. The right ventricular  size is normal. Tricuspid regurgitation signal is inadequate for assessing  PA pressure.   3. Left atrial size was mildly dilated.   4. The mitral valve is normal in structure. No evidence of mitral valve  regurgitation. No evidence of mitral stenosis.   5. The aortic valve is tricuspid. There is mild calcification of the  aortic valve. Aortic valve regurgitation is not visualized. No aortic  stenosis is present.   6. The inferior vena cava is normal in size with greater than 50%  respiratory variability, suggesting right atrial pressure of 3 mmHg.       Coronary CTA 11/01/23: IMPRESSION: 1. Coronary calcium  score of 271. This was 88th percentile for age-, sex, and race-matched controls.   2. Total plaque volume 473 mm3 which is 74th percentile for age- and sex-matched controls (calcified plaque 41 mm3; non-calcified plaque 432 mm3). TPV is severe.   3. Normal coronary origin with right dominance.   4. There is moderate (50-69%) mixed plaque in the proximal and mid LAD. There is mild (25-49%) plaque in OM1. CAD-RADS 3.   5. Will send for FFR-CT.   6.  Aortic atherosclerosis 1. Left Main: FFRct 0.99   2. LAD: FFRct 0.96 proximal, 0.8 mid, 0.74 distal   3. LCX: FFRct 0.98 proximal, 0.94 mid.  OM1 FFRct 0.87.   4. RCA: FFRct 0.96 proximal, 0.91 distal   IMPRESSION: 1.  FFRct findings are concerning for ischemia in the mid LAD.   2.  Consider cardiac catheterization.  Event monitor 11/03/23:  Study Highlights      Normal sinus rhythm   2 runs of SVT longest 7 beats   Rare PVC     Patch Wear Time:  10 days and 22 hours (2025-05-24T13:55:51-0400 to 2025-06-04T12:05:53-0400)   Patient had a min HR of 64 bpm, max HR of 146 bpm, and avg HR of 87 bpm. Predominant underlying rhythm was Sinus Rhythm. 2 Supraventricular Tachycardia runs occurred, the run with  the fastest interval lasting 7 beats with a max rate of 146 bpm (avg  123  bpm); the run with the fastest interval was also the longest. Isolated SVEs were rare (<1.0%), SVE Couplets were rare (<1.0%), and SVE Triplets were rare (<1.0%). Isolated VEs were rare (<1.0%), VE Couplets were rare (<1.0%), and no VE Triplets were  present. Ventricular Bigeminy and Trigeminy were present. Inverted QRS complexes possibly due to inverted placement of device.   Cardiac cath: 11/09/23:  LEFT HEART CATH AND CORONARY ANGIOGRAPHY   Conclusion      Prox RCA lesion is 30% stenosed.   Mid RCA lesion is 30% stenosed.   Prox LAD to Mid LAD lesion is 40% stenosed.   1st Diag lesion is 85% stenosed.   LV end diastolic pressure is normal.   Single vessel obstructive CAD involving the first diagonal. This is too small for PCI Normal LVEDP   Plan: medical management. Given autonomic dysfunction with low BP will start Ranexa  500 mg bid. Risk factor modification.     Recent Labs: 09/12/2023: ALT 16 09/13/2023: TSH 1.616 09/14/2023: Magnesium  1.8 11/05/2023: BUN 15; Creatinine, Ser 0.81; Hemoglobin 13.5; Platelets 329; Potassium 4.8; Sodium 141  Recent Lipid Panel    Component Value Date/Time   CHOL 158 10/29/2021 1008   CHOL 192 01/31/2018 1449   TRIG 174 (H) 10/29/2021 1008   HDL 29 (L) 10/29/2021 1008   HDL 26 (L) 01/31/2018 1449   CHOLHDL 5.4 10/29/2021 1008   VLDL 35 10/29/2021 1008   LDLCALC 94 10/29/2021 1008   LDLCALC 134 (H) 01/31/2018 1449   LDLDIRECT 120.0 09/26/2015 1226     Risk Assessment/Calculations:                Physical Exam:    VS:  BP 115/73 (BP Location: Left Arm, Patient Position: Sitting, Cuff Size: Normal)   Pulse 91   Ht 5' 6 (1.676 m)   Wt 163 lb (73.9 kg)   SpO2 95%   BMI 26.31 kg/m     Wt Readings from Last 3 Encounters:  11/19/23 163 lb (73.9 kg)  11/09/23 161 lb 9.6 oz (73.3 kg)  10/12/23 161 lb 9.6 oz (73.3 kg)     GEN:  Well nourished, well developed  in no acute distress HEENT: Normal NECK: No JVD; No carotid bruits LYMPHATICS: No lymphadenopathy CARDIAC: RRR, no murmurs, rubs, gallops RESPIRATORY:  Clear to auscultation without rales, wheezing or rhonchi  ABDOMEN: Soft, non-tender, non-distended MUSCULOSKELETAL:  No edema; No deformity  SKIN: Warm and dry NEUROLOGIC:  Alert and oriented x 3 PSYCHIATRIC:  Normal affect   ASSESSMENT:    1. CAD in native artery   2. Dyspnea on exertion     PLAN:    In order of problems listed above:  DOE and chest heaviness. Echo in the hospital was [facility]. Coronary CTA suggested obstructive CAD. Subsequent cardiac cath demonstrate only significant stenosis in a small diagonal branch. Medical therapy recommended. She was started on Ranexa  due to history of hypotension. On follow up her chest pain is better. Still has a little SOB. Will continue Rx SVT with persistent episodes of tachycardia. Therapy limited by low BP and orthostasis. Event monitor was benign. Symptoms did not correlate with any arrhythmia DM Orthostatic hypotension. On midodrine . Recommend liberal salt intake and maintain good hydration. Recommend compression hose. Raise head of bed to activate baroreceptors.       Will follow up in 6 months   Medication Adjustments/Labs and Tests Ordered: Current medicines are reviewed at length with the patient today.  Concerns regarding medicines  are outlined above.  No orders of the defined types were placed in this encounter.  No orders of the defined types were placed in this encounter.   There are no Patient Instructions on file for this visit.   Signed, Devera Englander [place], MD  11/19/2023 3:51 PM    [facility]

## 2023-11-19 ENCOUNTER — Encounter: Payer: Self-pay | Admitting: Cardiology

## 2023-11-19 ENCOUNTER — Ambulatory Visit: Attending: Cardiology | Admitting: Cardiology

## 2023-11-19 VITALS — BP 115/73 | HR 91 | Ht 66.0 in | Wt 163.0 lb

## 2023-11-19 DIAGNOSIS — I2510 Atherosclerotic heart disease of native coronary artery without angina pectoris: Secondary | ICD-10-CM

## 2023-11-19 DIAGNOSIS — R0609 Other forms of dyspnea: Secondary | ICD-10-CM | POA: Diagnosis not present

## 2023-11-19 NOTE — Patient Instructions (Addendum)
 Medication Instructions:  Continue same medications *If you need a refill on your cardiac medications before your next appointment, please call your pharmacy*  Lab Work: None ordered  Testing/Procedures: None ordered  Follow-Up: At [facility], you and your health needs are our priority.  As part of our continuing mission to provide you with exceptional heart care, our providers are all part of one team.  This team includes your primary Cardiologist (physician) and Advanced Practice Providers or APPs (Physician Assistants and Nurse Practitioners) who all work together to provide you with the care you need, when you need it.  Your next appointment:  6 months   Mon 12/8 at 3:00 pm    Provider:  Dr.Jordan   We recommend signing up for the patient portal called MyChart.  Sign up information is provided on this After Visit Summary.  MyChart is used to connect with patients for Virtual Visits (Telemedicine).  Patients are able to view lab/test results, encounter notes, upcoming appointments, etc.  Non-urgent messages can be sent to your provider as well.   To learn more about what you can do with MyChart, go to [email or web address].

## 2023-12-01 DIAGNOSIS — E119 Type 2 diabetes mellitus without complications: Secondary | ICD-10-CM | POA: Diagnosis not present

## 2023-12-01 DIAGNOSIS — Z961 Presence of intraocular lens: Secondary | ICD-10-CM | POA: Diagnosis not present

## 2023-12-01 DIAGNOSIS — H26493 Other secondary cataract, bilateral: Secondary | ICD-10-CM | POA: Diagnosis not present

## 2023-12-01 DIAGNOSIS — H52203 Unspecified astigmatism, bilateral: Secondary | ICD-10-CM | POA: Diagnosis not present

## 2023-12-01 DIAGNOSIS — H43812 Vitreous degeneration, left eye: Secondary | ICD-10-CM | POA: Diagnosis not present

## 2023-12-28 DIAGNOSIS — M810 Age-related osteoporosis without current pathological fracture: Secondary | ICD-10-CM | POA: Diagnosis not present

## 2023-12-28 DIAGNOSIS — Z8781 Personal history of (healed) traumatic fracture: Secondary | ICD-10-CM | POA: Diagnosis not present

## 2023-12-28 DIAGNOSIS — E1169 Type 2 diabetes mellitus with other specified complication: Secondary | ICD-10-CM | POA: Diagnosis not present

## 2023-12-28 DIAGNOSIS — I700 Atherosclerosis of aorta: Secondary | ICD-10-CM | POA: Diagnosis not present

## 2023-12-28 DIAGNOSIS — Z8616 Personal history of COVID-19: Secondary | ICD-10-CM | POA: Diagnosis not present

## 2023-12-28 DIAGNOSIS — E1165 Type 2 diabetes mellitus with hyperglycemia: Secondary | ICD-10-CM | POA: Diagnosis not present

## 2024-01-03 ENCOUNTER — Other Ambulatory Visit (HOSPITAL_BASED_OUTPATIENT_CLINIC_OR_DEPARTMENT_OTHER)

## 2024-01-03 ENCOUNTER — Other Ambulatory Visit: Payer: Medicare HMO

## 2024-02-08 DIAGNOSIS — E1169 Type 2 diabetes mellitus with other specified complication: Secondary | ICD-10-CM | POA: Diagnosis not present

## 2024-02-08 DIAGNOSIS — M810 Age-related osteoporosis without current pathological fracture: Secondary | ICD-10-CM | POA: Diagnosis not present

## 2024-02-08 DIAGNOSIS — I700 Atherosclerosis of aorta: Secondary | ICD-10-CM | POA: Diagnosis not present

## 2024-02-08 DIAGNOSIS — E559 Vitamin D deficiency, unspecified: Secondary | ICD-10-CM | POA: Diagnosis not present

## 2024-02-08 DIAGNOSIS — E1165 Type 2 diabetes mellitus with hyperglycemia: Secondary | ICD-10-CM | POA: Diagnosis not present

## 2024-03-20 ENCOUNTER — Other Ambulatory Visit: Payer: Self-pay

## 2024-03-22 MED ORDER — RANOLAZINE ER 500 MG PO TB12: 500.0000 mg | ORAL_TABLET | Freq: Two times a day (BID) | ORAL | 2 refills | Status: AC

## 2024-04-05 DIAGNOSIS — E7800 Pure hypercholesterolemia, unspecified: Secondary | ICD-10-CM | POA: Diagnosis not present

## 2024-04-05 DIAGNOSIS — Z794 Long term (current) use of insulin: Secondary | ICD-10-CM | POA: Diagnosis not present

## 2024-04-05 DIAGNOSIS — I2510 Atherosclerotic heart disease of native coronary artery without angina pectoris: Secondary | ICD-10-CM | POA: Diagnosis not present

## 2024-04-05 DIAGNOSIS — Z23 Encounter for immunization: Secondary | ICD-10-CM | POA: Diagnosis not present

## 2024-04-05 DIAGNOSIS — I951 Orthostatic hypotension: Secondary | ICD-10-CM | POA: Diagnosis not present

## 2024-04-05 DIAGNOSIS — E1165 Type 2 diabetes mellitus with hyperglycemia: Secondary | ICD-10-CM | POA: Diagnosis not present

## 2024-04-05 DIAGNOSIS — I700 Atherosclerosis of aorta: Secondary | ICD-10-CM | POA: Diagnosis not present

## 2024-04-24 DIAGNOSIS — B349 Viral infection, unspecified: Secondary | ICD-10-CM | POA: Diagnosis not present

## 2024-04-24 DIAGNOSIS — Z03818 Encounter for observation for suspected exposure to other biological agents ruled out: Secondary | ICD-10-CM | POA: Diagnosis not present

## 2024-04-24 NOTE — Progress Notes (Deleted)
 Cardiology Office Note:    Date:  04/24/2024   ID:  Laura Gentry, DOB Nov 23, 1955, MRN [id number]  PCP:  Rexanne Ingle, MD   [facility] Providers Cardiologist:  None     Referring MD: Rexanne Ingle, MD   No chief complaint on file.   History of Present Illness:    Laura Gentry is a 68 y.o. female seen for follow up SVT. She was recently admitted with acute GI illness related to Norovirus. Went into SVT that broke with IV adenosine . Troponin increased to 118. Echo done showing normal overall LV function. There was some question of basal anterior HK but in my review it was a difficult area to assess due to Echo drop out and difficulty seeing the endocardial border. History of remote cerebral aneurysm clipping. History of orthostatic hypotension on midodrine . Was taking propranolol  chronically for tremor but this was discontinued.   We performed event monitor showing no significant arrhythmia. CTA suggested significant LAD stenosis but on cath only obstruction in small diagonal branch. Clinically improved on Ranexa .     Past Medical History:  Diagnosis Date   Brain aneurysm 03/19/2013   Cataracts, bilateral    Cerebral aneurysm    Colon polyps    Diabetes (HCC) 2013   High cholesterol    History of kidney stones    Insomnia    Lumbosacral radiculopathy    Osteoporosis    Skin cancer    squamous   Thyroid  disease    Tremor     Past Surgical History:  Procedure Laterality Date   APPENDECTOMY     BRAIN SURGERY  03/19/2013   Sugita aneyrsum clip can have MRI up to 4T docs scanned in    BREAST BIOPSY Right    CHOLECYSTECTOMY  2013   CYSTOSCOPY WITH RETROGRADE PYELOGRAM, URETEROSCOPY AND STENT PLACEMENT Bilateral 10/29/2021   Procedure: CYSTOSCOPY WITH RETROGRADE PYELOGRAM, URETEROSCOPY AND STENT PLACEMENT;  Surgeon: Alvaro Hummer, MD;  Location: [facility];  Service: Urology;  Laterality: Bilateral;   CYSTOSCOPY WITH RETROGRADE PYELOGRAM, URETEROSCOPY AND  STENT PLACEMENT Bilateral 11/19/2021   Procedure: CYSTOSCOPY WITH RETROGRADE PYELOGRAM, URETEROSCOPY AND STENT EXCHANGE, BASKET STONE REMOVAL;  Surgeon: Alvaro Hummer, MD;  Location: [facility];  Service: Urology;  Laterality: Bilateral;  75 MINS   HIP PINNING,CANNULATED Left 11/04/2021   Procedure: CANNULATED HIP PINNING;  Surgeon: Genelle Standing, MD;  Location: [facility];  Service: Orthopedics;  Laterality: Left;   HOLMIUM LASER APPLICATION Bilateral 11/19/2021   Procedure: HOLMIUM LASER APPLICATION;  Surgeon: Alvaro Hummer, MD;  Location: [facility];  Service: Urology;  Laterality: Bilateral;   INNER EAR SURGERY     Lost Hearing   LEFT HEART CATH AND CORONARY ANGIOGRAPHY N/A 11/09/2023   Procedure: LEFT HEART CATH AND CORONARY ANGIOGRAPHY;  Surgeon: Theodore Rahrig M, MD;  Location: [facility] INVASIVE CV LAB;  Service: Cardiovascular;  Laterality: N/A;   TONSILLECTOMY AND ADENOIDECTOMY     VAGINAL HYSTERECTOMY     At age 80 due to growth    Current Medications: No outpatient medications have been marked as taking for the 05/01/24 encounter (Appointment) with Nobel Brar M, MD.     Allergies:   Amoxicillin, Bee venom, Penicillins, Pneumococcal vaccines, Sulfa  antibiotics, and Bactrim  [sulfamethoxazole -trimethoprim ]   Social History   Socioeconomic History   Marital status: Divorced    Spouse name: Not on file   Number of children: 0   Years of education: Not on file   Highest education level: Some college, no degree  Occupational History   Occupation: Catering Manager: [employer]    Comment: disabled  Tobacco Use   Smoking status: Former    Current packs/day: 0.00    Average packs/day: 0.5 packs/day for 40.0 years (20.0 ttl pk-yrs)    Types: Cigarettes    Start date: 05/26/1971    Quit date: 05/26/2011    Years since quitting: 12.9   Smokeless tobacco: Never  Vaping Use   Vaping status: Never Used  Substance and Sexual Activity   Alcohol use: No    Alcohol/week:  0.0 standard drinks of alcohol   Drug use: No   Sexual activity: Never  Other Topics Concern   Not on file  Social History Narrative   09/08/21 her mom lives with her   Caffeine-tea   Social Drivers of Health   Financial Resource Strain: Not on file  Food Insecurity: No Food Insecurity (09/13/2023)   Hunger Vital Sign    Worried About Running Out of Food in the Last Year: Never true    Ran Out of Food in the Last Year: Never true  Transportation Needs: No Transportation Needs (09/13/2023)   PRAPARE - Administrator, Civil Service (Medical): No    Lack of Transportation (Non-Medical): No  Physical Activity: Not on file  Stress: Not on file  Social Connections: Unknown (09/13/2023)   Social Connection and Isolation Panel    Frequency of Communication with Friends and Family: Once a week    Frequency of Social Gatherings with Friends and Family: Once a week    Attends Religious Services: 1 to 4 times per year    Active Member of [employer] or Organizations: Not on file    Attends Banker Meetings: 1 to 4 times per year    Marital Status: Not on file     Family History: The patient's family history includes Allergies in her mother; Asthma in her mother; Breast cancer in her maternal aunt, maternal aunt, and paternal aunt; Diabetes in her maternal grandfather and mother; Emphysema in her maternal aunt; Hypertension in her maternal grandfather; Lung cancer in her father; Pancreatic cancer in her brother; Throat cancer in her father.  ROS:   Please see the history of present illness.     All other systems reviewed and are negative.  EKGs/Labs/Other Studies Reviewed:    The following studies were reviewed today: Echo 09/13/23: IMPRESSIONS     1. Left ventricular ejection fraction, by estimation, is 55 to 60%. The  left ventricle has normal function. The left ventricle demonstrates  regional wall motion abnormalities with possible basal anterolateral  hypokinesis.  Left ventricular diastolic  parameters are consistent with Grade I diastolic dysfunction (impaired  relaxation).   2. Right ventricular systolic function is normal. The right ventricular  size is normal. Tricuspid regurgitation signal is inadequate for assessing  PA pressure.   3. Left atrial size was mildly dilated.   4. The mitral valve is normal in structure. No evidence of mitral valve  regurgitation. No evidence of mitral stenosis.   5. The aortic valve is tricuspid. There is mild calcification of the  aortic valve. Aortic valve regurgitation is not visualized. No aortic  stenosis is present.   6. The inferior vena cava is normal in size with greater than 50%  respiratory variability, suggesting right atrial pressure of 3 mmHg.       Coronary CTA 11/01/23: IMPRESSION: 1. Coronary calcium  score of 271. This was 88th  percentile for age-, sex, and race-matched controls.   2. Total plaque volume 473 mm3 which is 74th percentile for age- and sex-matched controls (calcified plaque 41 mm3; non-calcified plaque 432 mm3). TPV is severe.   3. Normal coronary origin with right dominance.   4. There is moderate (50-69%) mixed plaque in the proximal and mid LAD. There is mild (25-49%) plaque in OM1. CAD-RADS 3.   5. Will send for FFR-CT.   6.  Aortic atherosclerosis 1. Left Main: FFRct 0.99   2. LAD: FFRct 0.96 proximal, 0.8 mid, 0.74 distal   3. LCX: FFRct 0.98 proximal, 0.94 mid.  OM1 FFRct 0.87.   4. RCA: FFRct 0.96 proximal, 0.91 distal   IMPRESSION: 1.  FFRct findings are concerning for ischemia in the mid LAD.   2.  Consider cardiac catheterization.  Event monitor 11/03/23:  Study Highlights      Normal sinus rhythm   2 runs of SVT longest 7 beats   Rare PVC     Patch Wear Time:  10 days and 22 hours (2025-05-24T13:55:51-0400 to 2025-06-04T12:05:53-0400)   Patient had a min HR of 64 bpm, max HR of 146 bpm, and avg HR of 87 bpm. Predominant underlying rhythm was Sinus  Rhythm. 2 Supraventricular Tachycardia runs occurred, the run with the fastest interval lasting 7 beats with a max rate of 146 bpm (avg 123  bpm); the run with the fastest interval was also the longest. Isolated SVEs were rare (<1.0%), SVE Couplets were rare (<1.0%), and SVE Triplets were rare (<1.0%). Isolated VEs were rare (<1.0%), VE Couplets were rare (<1.0%), and no VE Triplets were  present. Ventricular Bigeminy and Trigeminy were present. Inverted QRS complexes possibly due to inverted placement of device.   Cardiac cath: 11/09/23:  LEFT HEART CATH AND CORONARY ANGIOGRAPHY   Conclusion      Prox RCA lesion is 30% stenosed.   Mid RCA lesion is 30% stenosed.   Prox LAD to Mid LAD lesion is 40% stenosed.   1st Diag lesion is 85% stenosed.   LV end diastolic pressure is normal.   Single vessel obstructive CAD involving the first diagonal. This is too small for PCI Normal LVEDP   Plan: medical management. Given autonomic dysfunction with low BP will start Ranexa  500 mg bid. Risk factor modification.     Recent Labs: 09/12/2023: ALT 16 09/13/2023: TSH 1.616 09/14/2023: Magnesium  1.8 11/05/2023: BUN 15; Creatinine, Ser 0.81; Hemoglobin 13.5; Platelets 329; Potassium 4.8; Sodium 141  Recent Lipid Panel    Component Value Date/Time   CHOL 158 10/29/2021 1008   CHOL 192 01/31/2018 1449   TRIG 174 (H) 10/29/2021 1008   HDL 29 (L) 10/29/2021 1008   HDL 26 (L) 01/31/2018 1449   CHOLHDL 5.4 10/29/2021 1008   VLDL 35 10/29/2021 1008   LDLCALC 94 10/29/2021 1008   LDLCALC 134 (H) 01/31/2018 1449   LDLDIRECT 120.0 09/26/2015 1226     Risk Assessment/Calculations:      No BP recorded.  {Refresh Note OR Click here to enter BP  :1}***         Physical Exam:    VS:  There were no vitals taken for this visit.    Wt Readings from Last 3 Encounters:  11/19/23 163 lb (73.9 kg)  11/09/23 161 lb 9.6 oz (73.3 kg)  10/12/23 161 lb 9.6 oz (73.3 kg)     GEN:  Well nourished, well  developed in no acute distress HEENT: Normal NECK: No JVD; No carotid  bruits LYMPHATICS: No lymphadenopathy CARDIAC: RRR, no murmurs, rubs, gallops RESPIRATORY:  Clear to auscultation without rales, wheezing or rhonchi  ABDOMEN: Soft, non-tender, non-distended MUSCULOSKELETAL:  No edema; No deformity  SKIN: Warm and dry NEUROLOGIC:  Alert and oriented x 3 PSYCHIATRIC:  Normal affect   ASSESSMENT:    No diagnosis found.   PLAN:    In order of problems listed above:  DOE and chest heaviness. Echo in the hospital was [facility]. Coronary CTA suggested obstructive CAD. Subsequent cardiac cath demonstrate only significant stenosis in a small diagonal branch. Medical therapy recommended. She was started on Ranexa  due to history of hypotension. On follow up her chest pain is better. Still has a little SOB. Will continue Rx SVT with persistent episodes of tachycardia. Therapy limited by low BP and orthostasis. Event monitor was benign. Symptoms did not correlate with any arrhythmia DM Orthostatic hypotension. On midodrine . Recommend liberal salt intake and maintain good hydration. Recommend compression hose. Raise head of bed to activate baroreceptors.       Will follow up in 6 months   Medication Adjustments/Labs and Tests Ordered: Current medicines are reviewed at length with the patient today.  Concerns regarding medicines are outlined above.  No orders of the defined types were placed in this encounter.  No orders of the defined types were placed in this encounter.   There are no Patient Instructions on file for this visit.   Signed, Jerrick Farve, MD  04/24/2024 9:15 AM    [facility]

## 2024-05-01 ENCOUNTER — Ambulatory Visit: Admitting: Cardiology

## 2024-05-08 ENCOUNTER — Other Ambulatory Visit (HOSPITAL_BASED_OUTPATIENT_CLINIC_OR_DEPARTMENT_OTHER): Payer: Self-pay | Admitting: Internal Medicine

## 2024-05-08 DIAGNOSIS — N200 Calculus of kidney: Secondary | ICD-10-CM

## 2024-05-08 DIAGNOSIS — R112 Nausea with vomiting, unspecified: Secondary | ICD-10-CM

## 2024-05-08 DIAGNOSIS — R10A1 Flank pain, right side: Secondary | ICD-10-CM

## 2024-05-08 DIAGNOSIS — R109 Unspecified abdominal pain: Secondary | ICD-10-CM

## 2024-05-09 ENCOUNTER — Ambulatory Visit (HOSPITAL_COMMUNITY)
Admission: RE | Admit: 2024-05-09 | Discharge: 2024-05-09 | Disposition: A | Discharge status: Home / Self Care | Source: Ambulatory Visit | Attending: Internal Medicine | Admitting: Internal Medicine

## 2024-05-09 DIAGNOSIS — R112 Nausea with vomiting, unspecified: Secondary | ICD-10-CM | POA: Insufficient documentation

## 2024-05-09 DIAGNOSIS — R109 Unspecified abdominal pain: Secondary | ICD-10-CM | POA: Insufficient documentation

## 2024-05-09 DIAGNOSIS — R10A1 Flank pain, right side: Secondary | ICD-10-CM | POA: Insufficient documentation

## 2024-05-09 DIAGNOSIS — N200 Calculus of kidney: Secondary | ICD-10-CM

## 2024-05-09 NOTE — Progress Notes (Deleted)
 Cardiology Office Note:    Date:  05/09/2024   ID:  Laura Gentry, DOB 04-02-1956, MRN [id number]  PCP:  Rexanne Ingle, MD   [facility] Providers Cardiologist:  None     Referring MD: Rexanne Ingle, MD   No chief complaint on file.   History of Present Illness:    Laura Gentry is a 68 y.o. female seen for follow up SVT. She was recently admitted with acute GI illness related to Norovirus. Went into SVT that broke with IV adenosine . Troponin increased to 118. Echo done showing normal overall LV function. There was some question of basal anterior HK but in my review it was a difficult area to assess due to Echo drop out and difficulty seeing the endocardial border. History of remote cerebral aneurysm clipping. History of orthostatic hypotension on midodrine . Was taking propranolol  chronically for tremor but this was discontinued.   We performed event monitor showing no significant arrhythmia. CTA suggested significant LAD stenosis but on cath only obstruction in small diagonal branch. Clinically improved on Ranexa .     Past Medical History:  Diagnosis Date   Brain aneurysm 03/19/2013   Cataracts, bilateral    Cerebral aneurysm    Colon polyps    Diabetes (HCC) 2013   High cholesterol    History of kidney stones    Insomnia    Lumbosacral radiculopathy    Osteoporosis    Skin cancer    squamous   Thyroid  disease    Tremor     Past Surgical History:  Procedure Laterality Date   APPENDECTOMY     BRAIN SURGERY  03/19/2013   Sugita aneyrsum clip can have MRI up to 4T docs scanned in    BREAST BIOPSY Right    CHOLECYSTECTOMY  2013   CYSTOSCOPY WITH RETROGRADE PYELOGRAM, URETEROSCOPY AND STENT PLACEMENT Bilateral 10/29/2021   Procedure: CYSTOSCOPY WITH RETROGRADE PYELOGRAM, URETEROSCOPY AND STENT PLACEMENT;  Surgeon: Alvaro Hummer, MD;  Location: [facility];  Service: Urology;  Laterality: Bilateral;   CYSTOSCOPY WITH RETROGRADE PYELOGRAM, URETEROSCOPY  AND STENT PLACEMENT Bilateral 11/19/2021   Procedure: CYSTOSCOPY WITH RETROGRADE PYELOGRAM, URETEROSCOPY AND STENT EXCHANGE, BASKET STONE REMOVAL;  Surgeon: Alvaro Hummer, MD;  Location: [facility];  Service: Urology;  Laterality: Bilateral;  75 MINS   HIP PINNING,CANNULATED Left 11/04/2021   Procedure: CANNULATED HIP PINNING;  Surgeon: Genelle Standing, MD;  Location: [facility];  Service: Orthopedics;  Laterality: Left;   HOLMIUM LASER APPLICATION Bilateral 11/19/2021   Procedure: HOLMIUM LASER APPLICATION;  Surgeon: Alvaro Hummer, MD;  Location: [facility];  Service: Urology;  Laterality: Bilateral;   INNER EAR SURGERY     Lost Hearing   LEFT HEART CATH AND CORONARY ANGIOGRAPHY N/A 11/09/2023   Procedure: LEFT HEART CATH AND CORONARY ANGIOGRAPHY;  Surgeon: Meryl Ponder M, MD;  Location: [facility] INVASIVE CV LAB;  Service: Cardiovascular;  Laterality: N/A;   TONSILLECTOMY AND ADENOIDECTOMY     VAGINAL HYSTERECTOMY     At age 42 due to growth    Current Medications: No outpatient medications have been marked as taking for the 05/11/24 encounter (Appointment) with Heath Tesler M, MD.     Allergies:   Amoxicillin, Bee venom, Penicillins, Pneumococcal vaccines, Sulfa  antibiotics, and Bactrim  [sulfamethoxazole -trimethoprim ]   Social History   Socioeconomic History   Marital status: Divorced    Spouse name: Not on file   Number of children: 0   Years of education: Not on file   Highest education level: Some college, no degree  Occupational History   Occupation: Catering Manager: [employer]    Comment: disabled  Tobacco Use   Smoking status: Former    Current packs/day: 0.00    Average packs/day: 0.5 packs/day for 40.0 years (20.0 ttl pk-yrs)    Types: Cigarettes    Start date: 05/26/1971    Quit date: 05/26/2011    Years since quitting: 12.9   Smokeless tobacco: Never  Vaping Use   Vaping status: Never Used  Substance and Sexual Activity   Alcohol use: No     Alcohol/week: 0.0 standard drinks of alcohol   Drug use: No   Sexual activity: Never  Other Topics Concern   Not on file  Social History Narrative   09/08/21 her mom lives with her   Caffeine-tea   Social Drivers of Health   Tobacco Use: Medium Risk (11/19/2023)   Patient History    Smoking Tobacco Use: Former    Smokeless Tobacco Use: Never    Passive Exposure: Not on Actuary Strain: Not on file  Food Insecurity: No Food Insecurity (09/13/2023)   Hunger Vital Sign    Worried About Running Out of Food in the Last Year: Never true    Ran Out of Food in the Last Year: Never true  Transportation Needs: No Transportation Needs (09/13/2023)   PRAPARE - Administrator, Civil Service (Medical): No    Lack of Transportation (Non-Medical): No  Physical Activity: Not on file  Stress: Not on file  Social Connections: Unknown (09/13/2023)   Social Connection and Isolation Panel    Frequency of Communication with Friends and Family: Once a week    Frequency of Social Gatherings with Friends and Family: Once a week    Attends Religious Services: 1 to 4 times per year    Active Member of [employer] or Organizations: Not on file    Attends Banker Meetings: 1 to 4 times per year    Marital Status: Not on file  Depression (EYV7-0): Not on file  Alcohol Screen: Not on file  Housing: Unknown (09/13/2023)   Housing Stability Vital Sign    Unable to Pay for Housing in the Last Year: No    Number of Times Moved in the Last Year: Not on file    Homeless in the Last Year: No  Utilities: Not At Risk (09/13/2023)   AHC Utilities    Threatened with loss of utilities: No  Health Literacy: Not on file     Family History: The patient's family history includes Allergies in her mother; Asthma in her mother; Breast cancer in her maternal aunt, maternal aunt, and paternal aunt; Diabetes in her maternal grandfather and mother; Emphysema in her maternal aunt; Hypertension in  her maternal grandfather; Lung cancer in her father; Pancreatic cancer in her brother; Throat cancer in her father.  ROS:   Please see the history of present illness.     All other systems reviewed and are negative.  EKGs/Labs/Other Studies Reviewed:    The following studies were reviewed today: Echo 09/13/23: IMPRESSIONS     1. Left ventricular ejection fraction, by estimation, is 55 to 60%. The  left ventricle has normal function. The left ventricle demonstrates  regional wall motion abnormalities with possible basal anterolateral  hypokinesis. Left ventricular diastolic  parameters are consistent with Grade I diastolic dysfunction (impaired  relaxation).   2. Right ventricular systolic function is normal. The right ventricular  size is  normal. Tricuspid regurgitation signal is inadequate for assessing  PA pressure.   3. Left atrial size was mildly dilated.   4. The mitral valve is normal in structure. No evidence of mitral valve  regurgitation. No evidence of mitral stenosis.   5. The aortic valve is tricuspid. There is mild calcification of the  aortic valve. Aortic valve regurgitation is not visualized. No aortic  stenosis is present.   6. The inferior vena cava is normal in size with greater than 50%  respiratory variability, suggesting right atrial pressure of 3 mmHg.       Coronary CTA 11/01/23: IMPRESSION: 1. Coronary calcium  score of 271. This was 88th percentile for age-, sex, and race-matched controls.   2. Total plaque volume 473 mm3 which is 74th percentile for age- and sex-matched controls (calcified plaque 41 mm3; non-calcified plaque 432 mm3). TPV is severe.   3. Normal coronary origin with right dominance.   4. There is moderate (50-69%) mixed plaque in the proximal and mid LAD. There is mild (25-49%) plaque in OM1. CAD-RADS 3.   5. Will send for FFR-CT.   6.  Aortic atherosclerosis 1. Left Main: FFRct 0.99   2. LAD: FFRct 0.96 proximal, 0.8 mid, 0.74  distal   3. LCX: FFRct 0.98 proximal, 0.94 mid.  OM1 FFRct 0.87.   4. RCA: FFRct 0.96 proximal, 0.91 distal   IMPRESSION: 1.  FFRct findings are concerning for ischemia in the mid LAD.   2.  Consider cardiac catheterization.  Event monitor 11/03/23:  Study Highlights      Normal sinus rhythm   2 runs of SVT longest 7 beats   Rare PVC     Patch Wear Time:  10 days and 22 hours (2025-05-24T13:55:51-0400 to 2025-06-04T12:05:53-0400)   Patient had a min HR of 64 bpm, max HR of 146 bpm, and avg HR of 87 bpm. Predominant underlying rhythm was Sinus Rhythm. 2 Supraventricular Tachycardia runs occurred, the run with the fastest interval lasting 7 beats with a max rate of 146 bpm (avg 123  bpm); the run with the fastest interval was also the longest. Isolated SVEs were rare (<1.0%), SVE Couplets were rare (<1.0%), and SVE Triplets were rare (<1.0%). Isolated VEs were rare (<1.0%), VE Couplets were rare (<1.0%), and no VE Triplets were  present. Ventricular Bigeminy and Trigeminy were present. Inverted QRS complexes possibly due to inverted placement of device.   Cardiac cath: 11/09/23:  LEFT HEART CATH AND CORONARY ANGIOGRAPHY   Conclusion      Prox RCA lesion is 30% stenosed.   Mid RCA lesion is 30% stenosed.   Prox LAD to Mid LAD lesion is 40% stenosed.   1st Diag lesion is 85% stenosed.   LV end diastolic pressure is normal.   Single vessel obstructive CAD involving the first diagonal. This is too small for PCI Normal LVEDP   Plan: medical management. Given autonomic dysfunction with low BP will start Ranexa  500 mg bid. Risk factor modification.     Recent Labs: 09/12/2023: ALT 16 09/13/2023: TSH 1.616 09/14/2023: Magnesium  1.8 11/05/2023: BUN 15; Creatinine, Ser 0.81; Hemoglobin 13.5; Platelets 329; Potassium 4.8; Sodium 141  Recent Lipid Panel    Component Value Date/Time   CHOL 158 10/29/2021 1008   CHOL 192 01/31/2018 1449   TRIG 174 (H) 10/29/2021 1008   HDL 29 (L)  10/29/2021 1008   HDL 26 (L) 01/31/2018 1449   CHOLHDL 5.4 10/29/2021 1008   VLDL 35 10/29/2021 1008   LDLCALC 94  10/29/2021 1008   LDLCALC 134 (H) 01/31/2018 1449   LDLDIRECT 120.0 09/26/2015 1226     Risk Assessment/Calculations:      No BP recorded.  {Refresh Note OR Click here to enter BP  :1}***         Physical Exam:    VS:  There were no vitals taken for this visit.    Wt Readings from Last 3 Encounters:  11/19/23 163 lb (73.9 kg)  11/09/23 161 lb 9.6 oz (73.3 kg)  10/12/23 161 lb 9.6 oz (73.3 kg)     GEN:  Well nourished, well developed in no acute distress HEENT: Normal NECK: No JVD; No carotid bruits LYMPHATICS: No lymphadenopathy CARDIAC: RRR, no murmurs, rubs, gallops RESPIRATORY:  Clear to auscultation without rales, wheezing or rhonchi  ABDOMEN: Soft, non-tender, non-distended MUSCULOSKELETAL:  No edema; No deformity  SKIN: Warm and dry NEUROLOGIC:  Alert and oriented x 3 PSYCHIATRIC:  Normal affect   ASSESSMENT:    No diagnosis found.   PLAN:    In order of problems listed above:  DOE and chest heaviness. Echo in the hospital was [facility]. Coronary CTA suggested obstructive CAD. Subsequent cardiac cath demonstrate only significant stenosis in a small diagonal branch. Medical therapy recommended. She was started on Ranexa  due to history of hypotension. On follow up her chest pain is better. Still has a little SOB. Will continue Rx SVT with persistent episodes of tachycardia. Therapy limited by low BP and orthostasis. Event monitor was benign. Symptoms did not correlate with any arrhythmia DM Orthostatic hypotension. On midodrine . Recommend liberal salt intake and maintain good hydration. Recommend compression hose. Raise head of bed to activate baroreceptors.       Will follow up in 6 months   Medication Adjustments/Labs and Tests Ordered: Current medicines are reviewed at length with the patient today.  Concerns regarding medicines are outlined  above.  No orders of the defined types were placed in this encounter.  No orders of the defined types were placed in this encounter.   There are no Patient Instructions on file for this visit.   Signed, Teondra Newburg, MD  05/09/2024 7:46 AM    [facility]

## 2024-05-11 ENCOUNTER — Other Ambulatory Visit: Payer: Self-pay

## 2024-05-11 ENCOUNTER — Emergency Department (HOSPITAL_COMMUNITY)

## 2024-05-11 ENCOUNTER — Ambulatory Visit: Admitting: Cardiology

## 2024-05-11 ENCOUNTER — Encounter (HOSPITAL_COMMUNITY): Payer: Self-pay

## 2024-05-11 ENCOUNTER — Inpatient Hospital Stay (HOSPITAL_COMMUNITY)
Admission: EM | Admit: 2024-05-11 | Discharge: 2024-05-14 | DRG: 872 | Disposition: A | Discharge status: Home / Self Care | Attending: Family Medicine | Admitting: Family Medicine

## 2024-05-11 DIAGNOSIS — E7800 Pure hypercholesterolemia, unspecified: Secondary | ICD-10-CM | POA: Diagnosis present

## 2024-05-11 DIAGNOSIS — A419 Sepsis, unspecified organism: Secondary | ICD-10-CM | POA: Diagnosis not present

## 2024-05-11 DIAGNOSIS — I9589 Other hypotension: Secondary | ICD-10-CM | POA: Diagnosis present

## 2024-05-11 DIAGNOSIS — N179 Acute kidney failure, unspecified: Secondary | ICD-10-CM | POA: Diagnosis present

## 2024-05-11 DIAGNOSIS — I480 Paroxysmal atrial fibrillation: Secondary | ICD-10-CM | POA: Diagnosis present

## 2024-05-11 DIAGNOSIS — N200 Calculus of kidney: Secondary | ICD-10-CM | POA: Diagnosis present

## 2024-05-11 DIAGNOSIS — Z8679 Personal history of other diseases of the circulatory system: Secondary | ICD-10-CM

## 2024-05-11 DIAGNOSIS — Z7985 Long-term (current) use of injectable non-insulin antidiabetic drugs: Secondary | ICD-10-CM

## 2024-05-11 DIAGNOSIS — Z85828 Personal history of other malignant neoplasm of skin: Secondary | ICD-10-CM

## 2024-05-11 DIAGNOSIS — Z880 Allergy status to penicillin: Secondary | ICD-10-CM

## 2024-05-11 DIAGNOSIS — E872 Acidosis, unspecified: Secondary | ICD-10-CM | POA: Diagnosis present

## 2024-05-11 DIAGNOSIS — E871 Hypo-osmolality and hyponatremia: Secondary | ICD-10-CM | POA: Diagnosis present

## 2024-05-11 DIAGNOSIS — G901 Familial dysautonomia [Riley-Day]: Secondary | ICD-10-CM | POA: Diagnosis present

## 2024-05-11 DIAGNOSIS — I671 Cerebral aneurysm, nonruptured: Secondary | ICD-10-CM | POA: Diagnosis present

## 2024-05-11 DIAGNOSIS — Z90710 Acquired absence of both cervix and uterus: Secondary | ICD-10-CM

## 2024-05-11 DIAGNOSIS — Z825 Family history of asthma and other chronic lower respiratory diseases: Secondary | ICD-10-CM

## 2024-05-11 DIAGNOSIS — Z800 Family history of malignant neoplasm of digestive organs: Secondary | ICD-10-CM

## 2024-05-11 DIAGNOSIS — E1165 Type 2 diabetes mellitus with hyperglycemia: Secondary | ICD-10-CM | POA: Diagnosis present

## 2024-05-11 DIAGNOSIS — M5417 Radiculopathy, lumbosacral region: Secondary | ICD-10-CM | POA: Diagnosis present

## 2024-05-11 DIAGNOSIS — M810 Age-related osteoporosis without current pathological fracture: Secondary | ICD-10-CM | POA: Diagnosis present

## 2024-05-11 DIAGNOSIS — Z881 Allergy status to other antibiotic agents status: Secondary | ICD-10-CM

## 2024-05-11 DIAGNOSIS — I2510 Atherosclerotic heart disease of native coronary artery without angina pectoris: Secondary | ICD-10-CM | POA: Diagnosis present

## 2024-05-11 DIAGNOSIS — Z794 Long term (current) use of insulin: Secondary | ICD-10-CM

## 2024-05-11 DIAGNOSIS — Z8249 Family history of ischemic heart disease and other diseases of the circulatory system: Secondary | ICD-10-CM

## 2024-05-11 DIAGNOSIS — R6520 Severe sepsis without septic shock: Secondary | ICD-10-CM | POA: Diagnosis not present

## 2024-05-11 DIAGNOSIS — R0602 Shortness of breath: Principal | ICD-10-CM | POA: Diagnosis present

## 2024-05-11 DIAGNOSIS — A4151 Sepsis due to Escherichia coli [E. coli]: Principal | ICD-10-CM | POA: Diagnosis present

## 2024-05-11 DIAGNOSIS — E860 Dehydration: Secondary | ICD-10-CM | POA: Diagnosis present

## 2024-05-11 DIAGNOSIS — Z79899 Other long term (current) drug therapy: Secondary | ICD-10-CM

## 2024-05-11 DIAGNOSIS — Z887 Allergy status to serum and vaccine status: Secondary | ICD-10-CM

## 2024-05-11 DIAGNOSIS — Z7983 Long term (current) use of bisphosphonates: Secondary | ICD-10-CM

## 2024-05-11 DIAGNOSIS — Z803 Family history of malignant neoplasm of breast: Secondary | ICD-10-CM

## 2024-05-11 DIAGNOSIS — Z833 Family history of diabetes mellitus: Secondary | ICD-10-CM

## 2024-05-11 DIAGNOSIS — Z860100 Personal history of colon polyps, unspecified: Secondary | ICD-10-CM

## 2024-05-11 DIAGNOSIS — Z801 Family history of malignant neoplasm of trachea, bronchus and lung: Secondary | ICD-10-CM

## 2024-05-11 DIAGNOSIS — N390 Urinary tract infection, site not specified: Secondary | ICD-10-CM | POA: Diagnosis present

## 2024-05-11 DIAGNOSIS — F419 Anxiety disorder, unspecified: Secondary | ICD-10-CM | POA: Diagnosis present

## 2024-05-11 DIAGNOSIS — Z87891 Personal history of nicotine dependence: Secondary | ICD-10-CM

## 2024-05-11 DIAGNOSIS — K219 Gastro-esophageal reflux disease without esophagitis: Secondary | ICD-10-CM | POA: Diagnosis present

## 2024-05-11 DIAGNOSIS — I471 Supraventricular tachycardia, unspecified: Principal | ICD-10-CM | POA: Diagnosis not present

## 2024-05-11 DIAGNOSIS — E039 Hypothyroidism, unspecified: Secondary | ICD-10-CM | POA: Diagnosis present

## 2024-05-11 DIAGNOSIS — Z1612 Extended spectrum beta lactamase (ESBL) resistance: Secondary | ICD-10-CM | POA: Diagnosis present

## 2024-05-11 DIAGNOSIS — E876 Hypokalemia: Secondary | ICD-10-CM | POA: Diagnosis present

## 2024-05-11 DIAGNOSIS — Z882 Allergy status to sulfonamides status: Secondary | ICD-10-CM

## 2024-05-11 DIAGNOSIS — K529 Noninfective gastroenteritis and colitis, unspecified: Secondary | ICD-10-CM | POA: Diagnosis present

## 2024-05-11 DIAGNOSIS — Z808 Family history of malignant neoplasm of other organs or systems: Secondary | ICD-10-CM

## 2024-05-11 LAB — RESP PANEL BY RT-PCR (RSV, FLU A&B, COVID)  RVPGX2
Influenza A by PCR: NEGATIVE
Influenza B by PCR: NEGATIVE
Resp Syncytial Virus by PCR: NEGATIVE
SARS Coronavirus 2 by RT PCR: NEGATIVE

## 2024-05-11 LAB — I-STAT CG4 LACTIC ACID, ED: Lactic Acid, Venous: 2.3 mmol/L (ref 0.5–1.9)

## 2024-05-11 LAB — COMPREHENSIVE METABOLIC PANEL WITH GFR
ALT: 37 U/L (ref 0–44)
AST: 52 U/L — ABNORMAL HIGH (ref 15–41)
Albumin: 3.7 g/dL (ref 3.5–5.0)
Alkaline Phosphatase: 83 U/L (ref 38–126)
Anion gap: 15 (ref 5–15)
BUN: 18 mg/dL (ref 8–23)
CO2: 20 mmol/L — ABNORMAL LOW (ref 22–32)
Calcium: 8.4 mg/dL — ABNORMAL LOW (ref 8.9–10.3)
Chloride: 99 mmol/L (ref 98–111)
Creatinine, Ser: 1.22 mg/dL — ABNORMAL HIGH (ref 0.44–1.00)
GFR, Estimated: 48 mL/min — ABNORMAL LOW (ref >60)
Glucose, Bld: 213 mg/dL — ABNORMAL HIGH (ref 70–99)
Potassium: 3.5 mmol/L (ref 3.5–5.1)
Sodium: 134 mmol/L — ABNORMAL LOW (ref 135–145)
Total Bilirubin: 1.3 mg/dL — ABNORMAL HIGH (ref 0.0–1.2)
Total Protein: 6.8 g/dL (ref 6.5–8.1)

## 2024-05-11 LAB — CBC WITH DIFFERENTIAL/PLATELET
Abs Immature Granulocytes: 0.1 K/uL — ABNORMAL HIGH (ref 0.00–0.07)
Basophils Absolute: 0.1 K/uL (ref 0.0–0.1)
Basophils Relative: 0 %
Eosinophils Absolute: 0 K/uL (ref 0.0–0.5)
Eosinophils Relative: 0 %
HCT: 38.7 % (ref 36.0–46.0)
Hemoglobin: 12.7 g/dL (ref 12.0–15.0)
Immature Granulocytes: 1 %
Lymphocytes Relative: 3 %
Lymphs Abs: 0.4 K/uL — ABNORMAL LOW (ref 0.7–4.0)
MCH: 26.1 pg (ref 26.0–34.0)
MCHC: 32.8 g/dL (ref 30.0–36.0)
MCV: 79.5 fL — ABNORMAL LOW (ref 80.0–100.0)
Monocytes Absolute: 0.4 K/uL (ref 0.1–1.0)
Monocytes Relative: 3 %
Neutro Abs: 14.3 K/uL — ABNORMAL HIGH (ref 1.7–7.7)
Neutrophils Relative %: 93 %
Platelets: 263 K/uL (ref 150–400)
RBC: 4.87 MIL/uL (ref 3.87–5.11)
RDW: 13.8 % (ref 11.5–15.5)
WBC: 15.3 K/uL — ABNORMAL HIGH (ref 4.0–10.5)
nRBC: 0 % (ref 0.0–0.2)

## 2024-05-11 LAB — CBG MONITORING, ED
Glucose-Capillary: 165 mg/dL — ABNORMAL HIGH (ref 70–99)
Glucose-Capillary: 185 mg/dL — ABNORMAL HIGH (ref 70–99)

## 2024-05-11 LAB — URINALYSIS, ROUTINE W REFLEX MICROSCOPIC
Bilirubin Urine: NEGATIVE
Glucose, UA: 50 mg/dL — AB
Hgb urine dipstick: NEGATIVE
Ketones, ur: 5 mg/dL — AB
Nitrite: NEGATIVE
Protein, ur: 30 mg/dL — AB
Specific Gravity, Urine: >1.06 — ABNORMAL HIGH (ref 1.005–1.030)
pH: 5 (ref 5.0–8.0)

## 2024-05-11 LAB — LIPASE, BLOOD: Lipase: 11 U/L (ref 11–51)

## 2024-05-11 MED ORDER — SODIUM CHLORIDE 0.9 % IV SOLN
1.0000 g | Freq: Two times a day (BID) | INTRAVENOUS | Status: DC
  Administered 2024-05-12: 1 g via INTRAVENOUS
  Filled 2024-05-11 (×2): qty 20

## 2024-05-11 MED ORDER — SODIUM CHLORIDE 0.9 % IV SOLN
1.0000 g | Freq: Once | INTRAVENOUS | Status: AC
  Administered 2024-05-11: 17:00:00 1 g via INTRAVENOUS
  Filled 2024-05-11: qty 20

## 2024-05-11 MED ORDER — ACETAMINOPHEN 325 MG PO TABS
650.0000 mg | ORAL_TABLET | Freq: Four times a day (QID) | ORAL | Status: DC | PRN
  Administered 2024-05-12 – 2024-05-14 (×2): 650 mg via ORAL
  Filled 2024-05-11 (×2): qty 2

## 2024-05-11 MED ORDER — ONDANSETRON HCL 4 MG/2ML IJ SOLN
4.0000 mg | Freq: Four times a day (QID) | INTRAMUSCULAR | Status: AC
  Administered 2024-05-11 – 2024-05-13 (×8): 4 mg via INTRAVENOUS
  Filled 2024-05-11 (×8): qty 2

## 2024-05-11 MED ORDER — INSULIN GLARGINE 100 UNIT/ML ~~LOC~~ SOLN
20.0000 [IU] | Freq: Every day | SUBCUTANEOUS | Status: DC
  Administered 2024-05-11: 23:00:00 20 [IU] via SUBCUTANEOUS
  Filled 2024-05-11 (×3): qty 0.2

## 2024-05-11 MED ORDER — SERTRALINE HCL 50 MG PO TABS
25.0000 mg | ORAL_TABLET | Freq: Every day | ORAL | Status: DC
  Administered 2024-05-11 – 2024-05-13 (×3): 25 mg via ORAL
  Filled 2024-05-11 (×3): qty 1

## 2024-05-11 MED ORDER — BISACODYL 5 MG PO TBEC: 5.0000 mg | DELAYED_RELEASE_TABLET | Freq: Every day | ORAL | Status: DC | PRN

## 2024-05-11 MED ORDER — ACETAMINOPHEN 650 MG RE SUPP
650.0000 mg | Freq: Once | RECTAL | Status: AC
  Administered 2024-05-11: 14:00:00 650 mg via RECTAL
  Filled 2024-05-11: qty 1

## 2024-05-11 MED ORDER — SODIUM CHLORIDE 0.9 % IV SOLN: INTRAVENOUS | Status: DC

## 2024-05-11 MED ORDER — ATORVASTATIN CALCIUM 40 MG PO TABS
40.0000 mg | ORAL_TABLET | Freq: Every day | ORAL | Status: DC
  Administered 2024-05-11 – 2024-05-13 (×3): 40 mg via ORAL
  Filled 2024-05-11 (×3): qty 1

## 2024-05-11 MED ORDER — INSULIN ASPART 100 UNIT/ML IJ SOLN
0.0000 [IU] | Freq: Three times a day (TID) | INTRAMUSCULAR | Status: DC
  Administered 2024-05-11: 17:00:00 2 [IU] via SUBCUTANEOUS
  Administered 2024-05-12: 3 [IU] via SUBCUTANEOUS
  Administered 2024-05-12 – 2024-05-13 (×3): 2 [IU] via SUBCUTANEOUS
  Administered 2024-05-13 (×2): 1 [IU] via SUBCUTANEOUS
  Administered 2024-05-14: 2 [IU] via SUBCUTANEOUS
  Administered 2024-05-14 (×2): 1 [IU] via SUBCUTANEOUS
  Filled 2024-05-11 (×4): qty 1
  Filled 2024-05-11: qty 2
  Filled 2024-05-11: qty 1
  Filled 2024-05-11: qty 2
  Filled 2024-05-11 (×3): qty 1

## 2024-05-11 MED ORDER — SODIUM CHLORIDE 0.9 % IV SOLN: 1.0000 g | Freq: Two times a day (BID) | INTRAVENOUS | Status: DC

## 2024-05-11 MED ORDER — ENOXAPARIN SODIUM 40 MG/0.4ML IJ SOSY
40.0000 mg | PREFILLED_SYRINGE | INTRAMUSCULAR | Status: DC
  Administered 2024-05-12 – 2024-05-14 (×3): 40 mg via SUBCUTANEOUS
  Filled 2024-05-11 (×3): qty 0.4

## 2024-05-11 MED ORDER — PANTOPRAZOLE SODIUM 40 MG PO TBEC
40.0000 mg | DELAYED_RELEASE_TABLET | Freq: Every day | ORAL | Status: DC
  Administered 2024-05-11 – 2024-05-14 (×4): 40 mg via ORAL
  Filled 2024-05-11 (×4): qty 1

## 2024-05-11 MED ORDER — IOHEXOL 350 MG/ML SOLN
60.0000 mL | Freq: Once | INTRAVENOUS | Status: AC | PRN
  Administered 2024-05-11: 13:00:00 60 mL via INTRAVENOUS

## 2024-05-11 MED ORDER — LACTATED RINGERS IV BOLUS
1000.0000 mL | Freq: Once | INTRAVENOUS | Status: AC
  Administered 2024-05-11: 14:00:00 1000 mL via INTRAVENOUS

## 2024-05-11 MED ORDER — ONDANSETRON HCL 4 MG/2ML IJ SOLN
4.0000 mg | Freq: Once | INTRAMUSCULAR | Status: AC
  Administered 2024-05-11: 11:00:00 4 mg via INTRAVENOUS
  Filled 2024-05-11: qty 2

## 2024-05-11 MED ORDER — HYDRALAZINE HCL 20 MG/ML IJ SOLN: 10.0000 mg | Freq: Four times a day (QID) | INTRAMUSCULAR | Status: DC | PRN

## 2024-05-11 MED ORDER — GABAPENTIN 300 MG PO CAPS
900.0000 mg | ORAL_CAPSULE | Freq: Every day | ORAL | Status: DC
  Administered 2024-05-11 – 2024-05-13 (×3): 900 mg via ORAL
  Filled 2024-05-11 (×3): qty 3

## 2024-05-11 MED ORDER — ALBUTEROL SULFATE (2.5 MG/3ML) 0.083% IN NEBU: 2.5000 mg | INHALATION_SOLUTION | Freq: Four times a day (QID) | RESPIRATORY_TRACT | Status: DC | PRN

## 2024-05-11 MED ORDER — POLYETHYLENE GLYCOL 3350 17 G PO PACK: 17.0000 g | PACK | Freq: Every day | ORAL | Status: DC | PRN

## 2024-05-11 MED ORDER — RANOLAZINE ER 500 MG PO TB12
500.0000 mg | ORAL_TABLET | Freq: Two times a day (BID) | ORAL | Status: DC
  Administered 2024-05-11 – 2024-05-14 (×6): 500 mg via ORAL
  Filled 2024-05-11 (×6): qty 1

## 2024-05-11 MED ORDER — INSULIN ASPART 100 UNIT/ML IJ SOLN: 0.0000 [IU] | Freq: Every day | INTRAMUSCULAR | Status: DC

## 2024-05-11 MED ORDER — OXYCODONE HCL 5 MG PO TABS: 5.0000 mg | ORAL_TABLET | ORAL | Status: DC | PRN

## 2024-05-11 MED ORDER — ACETAMINOPHEN 650 MG RE SUPP: 650.0000 mg | Freq: Four times a day (QID) | RECTAL | Status: DC | PRN

## 2024-05-11 NOTE — ED Provider Notes (Cosign Needed)
 [place] [facility]GENC[facility] DEPARTMENT AT [facility] Provider Note   CSN: [id number] Arrival date & time: 05/11/24  9047     Patient presents with: Shortness of Breath and Emesis   Laura Gentry is a 68 y.o. female.   Laura Gentry 68 year old female presents today for worsening shortness of breath 1 week.  Additionally endorses cough (nonproductive), nausea, vomiting, diarrhea, weakness, dizziness.  Stated that she cannot eat or drink anything due to the nausea.  Seen by [facility] 12/15 for nausea and vomiting who prescribed Zofran  and gentle diet with return precautions to ED if cannot tolerate p.o. CT wo performed 12/15 for kidney stones demonstrated bilateral non-obstructive nephrolithiasis. Denies CP, Abdominal pain, dysuria. At baseline, not on home O2, does not currently smoke (previous smoker 1/2 pk per day for >10 years, does not use inhalers).  Stated that he is also prescribed antibiotic outpatient but was unable to tolerated p.o., cannot find where this is prescribed on chart review. Patient also reportedly fell yesterday by tripping over her footing. Did not loose consciousness, did not hit her head. Patient had to call her sister to help her up due to weakness.   The history is provided by the patient.  Shortness of Breath Associated symptoms: cough, fever and vomiting   Associated symptoms: no abdominal pain and no chest pain   Emesis Associated symptoms: chills, cough, diarrhea and fever   Associated symptoms: no abdominal pain       Prior to Admission medications  Medication Sig Start Date End Date Taking? Authorizing Provider  acetaminophen  (TYLENOL ) 500 MG tablet Take 1 tablet (500 mg total) by mouth every 6 (six) hours as needed for mild pain or headache. 12/01/21   Sebastian Toribio GAILS, MD  alendronate  (FOSAMAX ) 70 MG tablet Take 70 mg by mouth every Monday. 06/16/23   [provider]  atorvastatin  (LIPITOR) 40 MG tablet Take 1 tablet (40 mg total) by  mouth daily. Patient taking differently: Take 40 mg by mouth at bedtime. 01/31/18   Caleen Qualia, MD  Continuous Glucose Receiver (FREESTYLE LIBRE 3 READER) DEVI 1 Application by Does not apply route.    [provider]  gabapentin  (NEURONTIN ) 300 MG capsule Take 300 mg in AM, 300 mg at noon, 600 mg at bedtime Patient taking differently: Take 900 mg by mouth at bedtime. 09/21/16   Rivet, Connell PARAS, MD  insulin  aspart (NOVOLOG ) 100 UNIT/ML injection Inject 6-24 Units into the skin 3 (three) times daily before meals. Sliding scale:less than 100  Do not Take 100-140   = 6 units 141-180  =  10 units 181-220  =  12 units 221-260  =  14 units 261-300  =  16 units 301-340  =  18 units 341-380  =  20 units 381 > = 24 units    [provider]  insulin  glargine (LANTUS  SOLOSTAR) 100 UNIT/ML Solostar Pen Inject 32 Units into the skin at bedtime. Patient taking differently: Inject 40-50 Units into the skin See admin instructions. Above 150 take 50 units Below 150 take 40 units at bedtime 11/24/21   Singh, Prashant K, MD  midodrine  (PROAMATINE ) 5 MG tablet Take 1 tablet (5 mg total) by mouth 3 (three) times daily with meals. Patient taking differently: Take 5 mg by mouth 2 (two) times daily with a meal. 12/01/21   Sebastian Toribio GAILS, MD  POTASSIUM GLUCONATE PO Take 1 tablet by mouth daily.    [provider]  ranolazine  (RANEXA ) 500 MG  12 hr tablet Take 1 tablet (500 mg total) by mouth 2 (two) times daily. 03/22/24   Jordan, Peter M, MD  Semaglutide,0.25 or 0.5MG /DOS, (OZEMPIC, 0.25 OR 0.5 MG/DOSE,) 2 MG/3ML SOPN Inject 0.5 mg into the skin every Thursday. 10/15/22   [provider]  sertraline  (ZOLOFT ) 25 MG tablet Take 25 mg by mouth at bedtime. 07/14/23   [provider]  thiamine  (VITAMIN B-1) 100 MG tablet Take 100 mg by mouth daily.    [provider]  vitamin B-12 (CYANOCOBALAMIN ) 1000 MCG tablet Take 1,000 mcg by mouth daily.    [provider]  zolpidem  (AMBIEN ) 5 MG tablet Take 5 mg by mouth at bedtime as needed. 09/08/23   [provider]    Allergies: Amoxicillin, Bee venom, Penicillins, Pneumococcal vaccines, Sulfa  antibiotics, and Bactrim  [sulfamethoxazole -trimethoprim ]    Review of Systems  Constitutional:  Positive for chills, fatigue and fever.  Respiratory:  Positive for cough and shortness of breath.   Cardiovascular:  Negative for chest pain, palpitations and leg swelling.  Gastrointestinal:  Positive for diarrhea, nausea and vomiting. Negative for abdominal pain and constipation.  Genitourinary:  Negative for dysuria.  Neurological:  Positive for dizziness and light-headedness.    Updated Vital Signs BP 118/60   Pulse 92   Temp 98.1 F (36.7 C) (Oral)   Resp 16   Ht 5' 6 (1.676 m)   Wt 73.9 kg   SpO2 92%   BMI 26.30 kg/m   Physical Exam Constitutional:      General: She is in acute distress.     Appearance: She is ill-appearing. She is not toxic-appearing or diaphoretic.  Cardiovascular:     Rate and Rhythm: Normal rate and regular rhythm.     Pulses: No decreased pulses.     Heart sounds: No murmur heard.    No friction rub. No gallop.  Pulmonary:     Effort: No accessory muscle usage or respiratory distress.     Breath sounds: Examination of the left-lower field reveals rales. Rales present. No decreased breath sounds, wheezing or rhonchi.  Abdominal:     Palpations: Abdomen is soft.     Tenderness: There is abdominal tenderness (Right lower quadrant). There is no guarding.  Neurological:     Mental Status: She is alert.     (all labs ordered are listed, but only abnormal results are displayed) Labs Reviewed  CBC WITH DIFFERENTIAL/PLATELET - Abnormal; Notable for the following components:      Result Value   WBC 15.3 (*)    MCV 79.5 (*)    Neutro Abs 14.3 (*)    Lymphs Abs 0.4 (*)    Abs Immature Granulocytes 0.10 (*)    All other components within normal limits   COMPREHENSIVE METABOLIC PANEL WITH GFR - Abnormal; Notable for the following components:   Sodium 134 (*)    CO2 20 (*)    Glucose, Bld 213 (*)    Creatinine, Ser 1.22 (*)    Calcium  8.4 (*)    AST 52 (*)    Total Bilirubin 1.3 (*)    GFR, Estimated 48 (*)    All other components within normal limits  I-STAT CG4 LACTIC ACID, ED - Abnormal; Notable for the following components:   Lactic Acid, Venous 2.3 (*)    All other components within normal limits  RESP PANEL BY RT-PCR (RSV, FLU A&B, COVID)  RVPGX2  CULTURE, BLOOD (ROUTINE X 2)  CULTURE, BLOOD (ROUTINE X 2)  LIPASE,  BLOOD  URINALYSIS, ROUTINE W REFLEX MICROSCOPIC  I-STAT CG4 LACTIC ACID, ED    EKG: EKG Interpretation Date/Time:  Thursday May 11 2024 09:59:57 EST Ventricular Rate:  103 PR Interval:  152 QRS Duration:  84 QT Interval:  370 QTC Calculation: 484 R Axis:   62  Text Interpretation: Sinus tachycardia Artifact Abnormal ECG Confirmed by Garrick Charleston [phone]) on 05/11/2024 10:03:20 AM  Radiology: CT ABDOMEN PELVIS W CONTRAST Addendum Date: 05/11/2024 ADDENDUM REPORT: 05/11/2024 14:27 ADDENDUM: Circumferential wall thickening of the urinary bladder, which may be due to underdistension. If there is concern for acute cystitis, correlation with urinalysis would be recommended. Electronically Signed   By: Rogelia Myers M.D.   On: 05/11/2024 14:27   Result Date: 05/11/2024 CLINICAL DATA:  Abdominal pain, acute, nonlocalized EXAM: CT ABDOMEN AND PELVIS WITH CONTRAST TECHNIQUE: Multidetector CT imaging of the abdomen and pelvis was performed using the standard protocol following bolus administration of intravenous contrast. RADIATION DOSE REDUCTION: This exam was performed according to the departmental dose-optimization program which includes automated exposure control, adjustment of the mA and/or kV according to patient size and/or use of iterative reconstruction technique. CONTRAST:  60mL OMNIPAQUE  IOHEXOL  350  MG/ML SOLN COMPARISON:  05/09/2024, 01/20/2022 FINDINGS: Lower chest: No focal airspace consolidation or pleural effusion.Fibrolinear scarring and dependent atelectasis in the lung bases. Hepatobiliary: No mass.Cholecystectomy. No intrahepatic or extrahepatic biliary ductal dilation. The portal veins are patent. Pancreas: No mass or main ductal dilation. No peripancreatic inflammation or fluid collection. Spleen: Normal size. No mass. Adrenals/Urinary Tract: No adrenal masses. Similar appearance of bilateral renal cysts. Punctate nonobstructive bilateral calyceal calculi. No hydronephrosis. Circumferential wall thickening of the urinary bladder. Stomach/Bowel: The stomach is decompressed without focal abnormality. No small bowel wall thickening or inflammation. No small bowel obstruction.The appendix was not visualized, possibly surgically absent. Vascular/Lymphatic: No aortic aneurysm. Diffuse aortoiliac atherosclerosis. No intraabdominal or pelvic lymphadenopathy. Reproductive: Hysterectomy. No concerning adnexal mass.No free pelvic fluid. Other: No pneumoperitoneum, ascites, or mesenteric inflammation. Musculoskeletal: No acute fracture or destructive lesion. Osteopenia. Mild degenerative disc disease at L4-L5. Multilevel thoracolumbar osteophytosis. Redemonstrated lag screws transfixing the left femoral neck. IMPRESSION: 1. No acute intra-abdominal or pelvic abnormality. 2. Nonobstructive bilateral nephrolithiasis. No hydronephrosis in either kidney. Aortic Atherosclerosis (ICD10-I70.0). Electronically Signed: By: Rogelia Myers M.D. On: 05/11/2024 14:21   DG Chest 1 View Result Date: 05/11/2024 CLINICAL DATA:  Shortness of breath EXAM: CHEST  1 VIEW COMPARISON:  September 12, 2023 FINDINGS: The heart size and mediastinal contours are within normal limits. Minimal bibasilar subsegmental atelectasis or scarring is noted. The visualized skeletal structures are unremarkable. IMPRESSION: Minimal bibasilar  subsegmental atelectasis or scarring. Electronically Signed   By: Lynwood Landy Raddle M.D.   On: 05/11/2024 11:30     Procedures   Medications Ordered in the ED  meropenem  (MERREM ) 1 g in sodium chloride  0.9 % 100 mL IVPB (has no administration in time range)  ondansetron  (ZOFRAN ) injection 4 mg (4 mg Intravenous Given 05/11/24 1053)  acetaminophen  (TYLENOL ) suppository 650 mg (650 mg Rectal Given 05/11/24 1358)  iohexol  (OMNIPAQUE ) 350 MG/ML injection 60 mL (60 mLs Intravenous Contrast Given 05/11/24 1319)  lactated ringers  bolus 1,000 mL (0 mLs Intravenous Stopped 05/11/24 1525)                                    Medical Decision Making This patient is a 68 y.o. female who presents to the ED  for concern of shortness of breath, nausea, headache, diarrhea, weakness.   Past Medical History / Co-morbidities / Social History: Diabetes, history of kidney stones, osteoporosis, thyroid  disease  Additional history: Chart reviewed. Pertinent results include:  Seen by [facility] 12/1 for URI. Seen by Nat 12/15 for nausea/vomiting/diarrhea.  Prescribe Zofran   Physical Exam: Physical exam performed. The pertinent findings include:  Vitals: Respiratory rate >22, heart rate >90, temperature 7.5-->99.1 Ill-appearing Crackles appreciated in left lower lung  Abdominal exam tender to palpation right lower quadrant (s/p appendectomy)  Lab Tests: I ordered, and personally interpreted labs.  The pertinent results include:   -CBC: Leukocytosis (15.3), neutrophil predominant -CMP: Creatinine 1.22 (baseline appears to be around 0.8) -Lipase: 11 -Respiratory panel ordered: Pending -Lactic acid: 2.3 -Blood cultures pending -Urinalysis pending   Imaging Studies: I ordered imaging studies including and I independently visualized and interpreted imaging - I agree with the radiologist interpretation. Below are the tests ordered and my interpretations: CXR: Minimal bibasilar subsegmental atelectasis or  scarring. CT abdomen chest: Circumferential wall thickening of urinary bladder, bilateral nonobstructive nephrolithiasis, no other abdominal findings  EKG/Cardiac Monitoring:  My attending physician Dr. Garrick viewed and interpreted the EKG which showed an underlying rhythm of: Sinus tachycardia. I agree with this interpretation.   Medications: I ordered medication including -Tylenol  suppository for pain -LR 1 L bolus per sepsis protocol -Meropenem  1 g per sepsis protocol -Zofran  4 mg for nausea  Consultations Obtained: I requested consultation with the [facility] for admission.   MDM: Sepsis: Unknown source. Sepsis protocol activated. Patient presented very ill-appearing complaining of shortness of breath, diarrhea, nausea, weakness.  Patient met sepsis criteria with tachypnea, tachycardia, leukocytosis. Blood pressure has remained stable around 120s/65s. Suspecting viral etiology given other symptoms of nausea, vomiting, diarrhea, however given CT abdomen findings, possibly related to cystitis/UTI (UA pending). Lactic acid elevated to 2.3.  Presentation combination with organ dysfunction: AKI: serum creatinine elevated to 1.22 from baseline of 0.81.  Patient would benefit from admission. Pending labs include: urinalysis, blood cultures, and respiratory viral panel.   I discussed this case with my attending physician Dr. Garrick who cosigned this note including patient's presenting symptoms, physical exam, and planned diagnostics and interventions. Attending physician stated agreement with plan or made changes to plan which were implemented.      Amount and/or Complexity of Data Reviewed Labs: ordered. Radiology: ordered.  Risk OTC drugs. Prescription drug management.      Final diagnoses:  None    ED Discharge Orders     None          Lorriane Dehart, DO 05/11/24 1609

## 2024-05-11 NOTE — Progress Notes (Signed)
° °  Brief Progress Note   _____________________________________________________________________________________________________________  Patient Name: Laura Gentry Patient DOB: [birth year] Date: @TODAY @      Data: Reviewed vital signs, clinical notes, and labs.    Action: No action required at this time.    Response:  Sepsis work up. Awaiting bed assignment.  _____________________________________________________________________________________________________________  The [facility] RN Expeditor Sofya Moustafa S Nolia Tschantz Please contact us  directly via secure chat (search for [facility]) or by calling us  at [phone] [facility]).

## 2024-05-11 NOTE — ED Notes (Signed)
 Help get patient into a gown on the monitor got patient a warm blanket patient has family at bedside and call bell in reach

## 2024-05-11 NOTE — Sepsis Progress Note (Signed)
 Sepsis protocol monitored by eLink

## 2024-05-11 NOTE — H&P (Signed)
 [facility] History and Physical  Laura Gentry FMW:[id number] DOB: 1955-09-27 DOA: 05/11/2024 PCP: Rexanne Ingle, MD  Presented from: Home Chief Complaint: Nausea, vomiting, diarrhea, dehydration  History of Present Illness: Laura Gentry is a 68 y.o. female DM2, HLD, brain aneurysm, osteoporosis, lumbosacral radiculopathy, hypothyroidism. Patient lives at home with her sister.  At baseline, independent.  12/18, patient presented to ED today with complaint of progressively worsening shortness of breath for 1 week associated with nonproductive cough. Also reported nausea, vomiting, diarrhea leading to progressive weakness and dizziness. Has not been able to hold on any food or water 12/15, she was seen by [facility], prescribed Zofran  with no benefit.  Unable to tolerate antibiotic either. 12/16, she had CT abdomen pelvis performed which showed bilateral nonobstructive nephrolithiasis without hydronephrosis. 12/17, she felt weak, dizzy, tripped over her footing and fell, did not hit her head, did not pass out.  She had called her sister to help her to get up.  In the ED, patient is afebrile, heart rate in 90s, blood pressures 120s, breathing room air Initial labs with WC count 15.3, hemoglobin 12.7, Sodium 134, BUN/creatinine 18/1.22, glucose 213, lactic acid 2.3 Urinalysis showed hazy yellow urine with large leukocytes, rare bacteria EKG with sinus tachycardia at 103 bpm, QTc 484 ms Chest x-ray with minimal bibasilar atelectasis or scarring CT abdomen pelvis did not show any acute intra-abdominal or pelvic pathology, showed nonobstructive bilateral nephrolithiasis without any hydronephrosis.  Also showed circumferential wall thickening of the urinary bladder -underdistention versus acute cystitis Resp virus panel pending Blood culture sent Given history of allergy to multiple antibiotics, patient was started on IV meropenem   At the time of my evaluation, patient was lying  on bed.  Alert, awake, oriented x 3 slow to respond.  Looks dry.  Sister at bedside. Reports last vomiting was yesterday.  Had 3 episodes of diarrhea today, with stool soft consistency without blood or black stool.  Reported right-sided abdominal pain last few days which is better today.  Review of Systems:  All systems were reviewed and were negative unless otherwise mentioned in the HPI   Past medical history: Past Medical History:  Diagnosis Date   Brain aneurysm 03/19/2013   Cataracts, bilateral    Cerebral aneurysm    Colon polyps    Diabetes (HCC) 2013   High cholesterol    History of kidney stones    Insomnia    Lumbosacral radiculopathy    Osteoporosis    Skin cancer    squamous   Thyroid  disease    Tremor     Past surgical history: Past Surgical History:  Procedure Laterality Date   APPENDECTOMY     BRAIN SURGERY  03/19/2013   Sugita aneyrsum clip can have MRI up to 4T docs scanned in    BREAST BIOPSY Right    CHOLECYSTECTOMY  2013   CYSTOSCOPY WITH RETROGRADE PYELOGRAM, URETEROSCOPY AND STENT PLACEMENT Bilateral 10/29/2021   Procedure: CYSTOSCOPY WITH RETROGRADE PYELOGRAM, URETEROSCOPY AND STENT PLACEMENT;  Surgeon: Alvaro Hummer, MD;  Location: [facility];  Service: Urology;  Laterality: Bilateral;   CYSTOSCOPY WITH RETROGRADE PYELOGRAM, URETEROSCOPY AND STENT PLACEMENT Bilateral 11/19/2021   Procedure: CYSTOSCOPY WITH RETROGRADE PYELOGRAM, URETEROSCOPY AND STENT EXCHANGE, BASKET STONE REMOVAL;  Surgeon: Alvaro Hummer, MD;  Location: [facility];  Service: Urology;  Laterality: Bilateral;  75 MINS   HIP PINNING,CANNULATED Left 11/04/2021   Procedure: CANNULATED HIP PINNING;  Surgeon: Genelle Standing, MD;  Location: [facility];  Service: Orthopedics;  Laterality: Left;  HOLMIUM LASER APPLICATION Bilateral 11/19/2021   Procedure: HOLMIUM LASER APPLICATION;  Surgeon: Alvaro Hummer, MD;  Location: [facility];  Service: Urology;  Laterality: Bilateral;   INNER EAR SURGERY      Lost Hearing   LEFT HEART CATH AND CORONARY ANGIOGRAPHY N/A 11/09/2023   Procedure: LEFT HEART CATH AND CORONARY ANGIOGRAPHY;  Surgeon: Jordan, Peter M, MD;  Location: [facility] INVAS[facility] CV [facility];  Service: Cardiovascular;  Laterality: N/A;   TONSILLECTOMY AND ADENOIDECTOMY     VAGINAL HYSTERECTOMY     At age 82 due to growth    Social History:  reports that she quit smoking about 12 years ago. Her smoking use included cigarettes. She started smoking about 52 years ago. She has a 20 pack-year smoking history. She has never used smokeless tobacco. She reports that she does not drink alcohol and does not use drugs.  Allergies:  Allergies[1] Amoxicillin, Bee venom, Penicillins, Pneumococcal vaccines, Sulfa  antibiotics, Bactrim  [sulfamethoxazole -trimethoprim ], and Caffeine   Family history:  Family History  Problem Relation Age of Onset   Asthma Mother    Allergies Mother    Diabetes Mother    Lung cancer Father        smoked   Throat cancer Father        smoked   Pancreatic cancer Brother    Hypertension Maternal Grandfather    Diabetes Maternal Grandfather    Emphysema Maternal Aunt        never smoked, spouse did   Breast cancer Maternal Aunt    Breast cancer Maternal Aunt    Breast cancer Paternal Aunt      Physical Exam: Vitals:   05/11/24 1407 05/11/24 1530 05/11/24 1553 05/11/24 1615  BP:  118/60  98/83  Pulse:  92  86  Resp:  16  15  Temp: 99.1 F (37.3 C)  98.1 F (36.7 C)   TempSrc: Rectal  Oral   SpO2:  92%  95%  Weight:      Height:       Wt Readings from Last 3 Encounters:  05/11/24 73.9 kg  11/19/23 73.9 kg  11/09/23 73.3 kg   Body mass index is 26.3 kg/m.  General exam: Pleasant, elderly Caucasian female. Skin: No rashes, lesions or ulcers. HEENT: Atraumatic, normocephalic, no obvious bleeding Lungs: Clear to auscultation bilaterally,  CVS: S1, S2, no murmur,   GI/Abd: Soft, nontender, nondistended, bowel sound present,   CNS: Alert, awake, oriented  x 3.  Slow to respond Psychiatry: Sad affect Extremities: No pedal edema, no calf tenderness.   ----------------------------------------------------------------------------------------------------------------------------------------- ----------------------------------------------------------------------------------------------------------------------------------------- -----------------------------------------------------------------------------------------------------------------------------------------  Assessment/Plan: Sepsis POA  Likely UTI Presented with 1 week of progressive nausea, vomiting, diarrhea, poor oral tolerance, leading to weakness and fall Met sepsis criteria with tachypnea, tachycardia, leukocytosis, lactic acidosis Urinalysis showed hazy yellow urine with large leukocytes, rare bacteria CT abdomen pelvis as above which showed findings of cystitis.  She also has nonobstructive bilateral nephrolithiasis without hydronephrosis. Urine culture, blood culture sent Started on IV meropenem  Continue to monitor temperature, WBC, lactic acid trend Recent Labs  Lab 05/11/24 1035 05/11/24 1431  WBC 15.3*  --   LATICACIDVEN  --  2.3*   Intractable nausea, vomiting, poor oral intake No evidence of bowel obstruction or colitis or hepatobiliary, pancreatic pathology Start symptomatic management with scheduled IV zofran  q6h for 2 days.  Start IV fluid If diarrhea persist, will send stool studies Avoid Imodium  for now  AKI Hypothyroidism Sodium low, creatinine elevated secondary to dehydration Continue to monitor with IV fluid  Recent Labs  Lab 05/11/24 1204  NA 134*  K 3.5  CL 99  CO2 20*  GLUCOSE 213*  BUN 18  CREATININE 1.22*  CALCIUM  8.4*    Shortness of breath Chest x-ray without any evidence of pulm edema or pleural effusion Most recent echo from April 2025 with EF 55 to 60%, G1 DD, RV size and function normal Dyspnea probably is part of generalized  weakness Continue to monitor  Hx of dysautonomia Chronic hypotension Seems to be on midodrine  5 mg twice daily. Continue the same.   Type 2 diabetes mellitus uncontrolled with hyperglycemia A1c 10.5 in April 2025.  Repeat A1c PTA meds-Ozempic, Lantus  nightly 40 to 50 units Start Lantus  20 units tonight along with SSI/Accu-Cheks Lab Results  Component Value Date   HGBA1C 10.5 (H) 09/13/2023   Recent Labs  Lab 05/11/24 1705  GLUCAP 165*   CAD, HLD June 2025, left heart cath showed single-vessel obstructive CAD and first diagonal, too small to PCI.  Given history of autonomic dysfunction with low BP, patient was started on Ranexa  5 mg twice daily. PTA meds- Ranexa  500 mg twice daily, Lipitor 40 mg daily Resume both  Generalized weakness PT eval requested  Osteoporosis lumbosacral radiculopathy PTA meds- gabapentin  900 mg nightly Resume gabapentin  600 mg nightly  Anxiety Continue Zoloft   GERD Protonix  daily  Hypothyroidism Obtain TSH Recent Labs    09/13/23 0638  TSH 1.616    Mobility:  independent at baseline. Weak now. PT ordered.   Goals of care:   Code Status: Full Code    DVT prophylaxis:  enoxaparin  (LOVENOX ) injection 40 mg Start: 05/12/24 0800   Antimicrobials: meropenem  IV Fluid: NS@125  ml/hr Consultants: none Family Communication: sister Ms. Grayce was at bedside.  Status: obv Level of care:  Progressive   Patient is from: home Anticipated d/c to: hopefully home in 1-2 days  Diet:  Diet Order             Diet clear liquid Room service appropriate? Yes; Fluid consistency: Thin  Diet effective now                    ------------------------------------------------------------------------------------- Severity of Illness: The appropriate patient status for this patient is OBSERVATION. Observation status is judged to be reasonable and necessary in order to provide the required intensity of service to ensure the patient's safety. The  patient's presenting symptoms, physical exam findings, and initial radiographic and laboratory data in the context of their medical condition is felt to place them at decreased risk for further clinical deterioration. Furthermore, it is anticipated that the patient will be medically stable for discharge from the hospital within 2 midnights of admission.  -------------------------------------------------------------------------------------   Home Meds: Prior to Admission medications  Medication Sig Start Date End Date Taking? Authorizing Provider  acetaminophen  (TYLENOL ) 500 MG tablet Take 1 tablet (500 mg total) by mouth every 6 (six) hours as needed for mild pain or headache. 12/01/21   Sebastian Toribio GAILS, MD  alendronate  (FOSAMAX ) 70 MG tablet Take 70 mg by mouth every Monday. 06/16/23   [provider]  atorvastatin  (LIPITOR) 40 MG tablet Take 1 tablet (40 mg total) by mouth daily. Patient taking differently: Take 40 mg by mouth at bedtime. 01/31/18   Caleen Qualia, MD  Continuous Glucose Receiver (FREESTYLE LIBRE 3 READER) DEVI 1 Application by Does not apply route.    [provider]  gabapentin  (NEURONTIN ) 300 MG capsule Take 300 mg in AM, 300 mg at noon,  600 mg at bedtime Patient taking differently: Take 900 mg by mouth at bedtime. 09/21/16   Rivet, Connell PARAS, MD  insulin  aspart (NOVOLOG ) 100 UNIT/ML injection Inject 6-24 Units into the skin 3 (three) times daily before meals. Sliding scale:less than 100  Do not Take 100-140   = 6 units 141-180  =  10 units 181-220  =  12 units 221-260  =  14 units 261-300  =  16 units 301-340  =  18 units 341-380  =  20 units 381 > = 24 units    [provider]  insulin  glargine (LANTUS  SOLOSTAR) 100 UNIT/ML Solostar Pen Inject 32 Units into the skin at bedtime. Patient taking differently: Inject 40-50 Units into the skin See admin instructions. Above 150 take 50 units Below 150 take 40 units at bedtime 11/24/21   Singh, Prashant  K, MD  midodrine  (PROAMATINE ) 5 MG tablet Take 1 tablet (5 mg total) by mouth 3 (three) times daily with meals. Patient taking differently: Take 5 mg by mouth 2 (two) times daily with a meal. 12/01/21   Sebastian Toribio GAILS, MD  POTASSIUM GLUCONATE PO Take 1 tablet by mouth daily.    [provider]  ranolazine  (RANEXA ) 500 MG 12 hr tablet Take 1 tablet (500 mg total) by mouth 2 (two) times daily. 03/22/24   Jordan, Peter M, MD  Semaglutide,0.25 or 0.5MG /DOS, (OZEMPIC, 0.25 OR 0.5 MG/DOSE,) 2 MG/3ML SOPN Inject 0.5 mg into the skin every Thursday. 10/15/22   [provider]  sertraline  (ZOLOFT ) 25 MG tablet Take 25 mg by mouth at bedtime. 07/14/23   [provider]  thiamine  (VITAMIN B-1) 100 MG tablet Take 100 mg by mouth daily.    [provider]  vitamin B-12 (CYANOCOBALAMIN ) 1000 MCG tablet Take 1,000 mcg by mouth daily.    [provider]  zolpidem  (AMBIEN ) 5 MG tablet Take 5 mg by mouth at bedtime as needed. 09/08/23   [provider]    Labs on Admission:   CBC: Recent Labs  Lab 05/11/24 1035  WBC 15.3*  NEUTROABS 14.3*  HGB 12.7  HCT 38.7  MCV 79.5*  PLT 263    Basic Metabolic Panel: Recent Labs  Lab 05/11/24 1204  NA 134*  K 3.5  CL 99  CO2 20*  GLUCOSE 213*  BUN 18  CREATININE 1.22*  CALCIUM  8.4*    Liver Function Tests: Recent Labs  Lab 05/11/24 1204  AST 52*  ALT 37  ALKPHOS 83  BILITOT 1.3*  PROT 6.8  ALBUMIN 3.7   Recent Labs  Lab 05/11/24 1204  LIPASE 11   No results for input(s): AMMONIA in the last 168 hours.  Cardiac Enzymes: No results for input(s): CKTOTAL, CKMB, CKMBINDEX, TROPONINI in the last 168 hours.  BNP (last 3 results) No results for input(s): BNP in the last 8760 hours.  ProBNP (last 3 results) No results for input(s): PROBNP in the last 8760 hours.  CBG: Recent Labs  Lab 05/11/24 1705  GLUCAP 165*    Lipase     Component Value Date/Time   LIPASE 11  05/11/2024 1204     Urinalysis    Component Value Date/Time   COLORURINE YELLOW 05/11/2024 1544   APPEARANCEUR HAZY (A) 05/11/2024 1544   LABSPEC >1.060 (H) 05/11/2024 1544   PHURINE 5.0 05/11/2024 1544   GLUCOSEU 50 (A) 05/11/2024 1544   HGBUR NEGATIVE 05/11/2024 1544   BILIRUBINUR NEGATIVE 05/11/2024 1544   BILIRUBINUR small 12/31/2016 1710  KETONESUR 5 (A) 05/11/2024 1544   PROTEINUR 30 (A) 05/11/2024 1544   UROBILINOGEN 0.2 12/31/2016 1710   UROBILINOGEN 0.2 03/07/2015 1354   NITRITE NEGATIVE 05/11/2024 1544   LEUKOCYTESUR LARGE (A) 05/11/2024 1544     Drugs of Abuse  No results found for: LABOPIA, COCAINSCRNUR, LABBENZ, AMPHETMU, THCU, LABBARB    Radiological Exams on Admission: CT ABDOMEN PELVIS W CONTRAST Addendum Date: 05/11/2024 ADDENDUM REPORT: 05/11/2024 14:27 ADDENDUM: Circumferential wall thickening of the urinary bladder, which may be due to underdistension. If there is concern for acute cystitis, correlation with urinalysis would be recommended. Electronically Signed   By: Rogelia Myers M.D.   On: 05/11/2024 14:27   Result Date: 05/11/2024 CLINICAL DATA:  Abdominal pain, acute, nonlocalized EXAM: CT ABDOMEN AND PELVIS WITH CONTRAST TECHNIQUE: Multidetector CT imaging of the abdomen and pelvis was performed using the standard protocol following bolus administration of intravenous contrast. RADIATION DOSE REDUCTION: This exam was performed according to the departmental dose-optimization program which includes automated exposure control, adjustment of the mA and/or kV according to patient size and/or use of iterative reconstruction technique. CONTRAST:  60mL OMNIPAQUE  IOHEXOL  350 MG/ML SOLN COMPARISON:  05/09/2024, 01/20/2022 FINDINGS: Lower chest: No focal airspace consolidation or pleural effusion.Fibrolinear scarring and dependent atelectasis in the lung bases. Hepatobiliary: No mass.Cholecystectomy. No intrahepatic or extrahepatic biliary ductal dilation.  The portal veins are patent. Pancreas: No mass or main ductal dilation. No peripancreatic inflammation or fluid collection. Spleen: Normal size. No mass. Adrenals/Urinary Tract: No adrenal masses. Similar appearance of bilateral renal cysts. Punctate nonobstructive bilateral calyceal calculi. No hydronephrosis. Circumferential wall thickening of the urinary bladder. Stomach/Bowel: The stomach is decompressed without focal abnormality. No small bowel wall thickening or inflammation. No small bowel obstruction.The appendix was not visualized, possibly surgically absent. Vascular/Lymphatic: No aortic aneurysm. Diffuse aortoiliac atherosclerosis. No intraabdominal or pelvic lymphadenopathy. Reproductive: Hysterectomy. No concerning adnexal mass.No free pelvic fluid. Other: No pneumoperitoneum, ascites, or mesenteric inflammation. Musculoskeletal: No acute fracture or destructive lesion. Osteopenia. Mild degenerative disc disease at L4-L5. Multilevel thoracolumbar osteophytosis. Redemonstrated lag screws transfixing the left femoral neck. IMPRESSION: 1. No acute intra-abdominal or pelvic abnormality. 2. Nonobstructive bilateral nephrolithiasis. No hydronephrosis in either kidney. Aortic Atherosclerosis (ICD10-I70.0). Electronically Signed: By: Rogelia Myers M.D. On: 05/11/2024 14:21   DG Chest 1 View Result Date: 05/11/2024 CLINICAL DATA:  Shortness of breath EXAM: CHEST  1 VIEW COMPARISON:  September 12, 2023 FINDINGS: The heart size and mediastinal contours are within normal limits. Minimal bibasilar subsegmental atelectasis or scarring is noted. The visualized skeletal structures are unremarkable. IMPRESSION: Minimal bibasilar subsegmental atelectasis or scarring. Electronically Signed   By: Lynwood Landy Raddle M.D.   On: 05/11/2024 11:30     Signed, Chapman Rota, MD [facility] 05/11/2024        [1]  Allergies Allergen Reactions   Amoxicillin Anaphylaxis and Shortness Of Breath   Bee Venom  Shortness Of Breath   Penicillins Anaphylaxis, Swelling and Other (See Comments)    Remote reaction- Throat swelling - compromised airway Tolerates ceftriaxone    Pneumococcal Vaccines Other (See Comments)    Per patient due to Brain Aneurysm   Sulfa  Antibiotics Hives, Itching and Swelling   Bactrim  [Sulfamethoxazole -Trimethoprim ] Hives, Itching, Swelling and Other (See Comments)    Felt off balance   Caffeine Other (See Comments)    headache

## 2024-05-11 NOTE — Progress Notes (Signed)
 ED Pharmacy Antibiotic Sign Off An antibiotic consult was received from an ED provider for meropenem  per pharmacy dosing for sepsis. A chart review was completed to assess appropriateness.   The following one time order(s) were placed:  Meropenem  1gm IV once  Further antibiotic and/or antibiotic pharmacy consults should be ordered by the admitting provider if indicated.   Thank you for allowing pharmacy to be a part of this patient's care.   Vito Ralph, PharmD, BCPS Please see amion for complete clinical pharmacist phone list 05/11/2024 3:30 PM

## 2024-05-11 NOTE — ED Triage Notes (Signed)
 Patient reports sob, vomiting diarrhea that started on 12/15 and not getting better.  Patient resp are equal and unlabored when not speaking to her then she starts hyperventilating when you talk to her.

## 2024-05-12 DIAGNOSIS — I671 Cerebral aneurysm, nonruptured: Secondary | ICD-10-CM | POA: Diagnosis present

## 2024-05-12 DIAGNOSIS — E872 Acidosis, unspecified: Secondary | ICD-10-CM | POA: Diagnosis present

## 2024-05-12 DIAGNOSIS — I2510 Atherosclerotic heart disease of native coronary artery without angina pectoris: Secondary | ICD-10-CM | POA: Diagnosis present

## 2024-05-12 DIAGNOSIS — I480 Paroxysmal atrial fibrillation: Secondary | ICD-10-CM | POA: Diagnosis present

## 2024-05-12 DIAGNOSIS — M5417 Radiculopathy, lumbosacral region: Secondary | ICD-10-CM | POA: Diagnosis present

## 2024-05-12 DIAGNOSIS — E876 Hypokalemia: Secondary | ICD-10-CM | POA: Diagnosis present

## 2024-05-12 DIAGNOSIS — F419 Anxiety disorder, unspecified: Secondary | ICD-10-CM | POA: Diagnosis present

## 2024-05-12 DIAGNOSIS — N179 Acute kidney failure, unspecified: Secondary | ICD-10-CM | POA: Diagnosis present

## 2024-05-12 DIAGNOSIS — E039 Hypothyroidism, unspecified: Secondary | ICD-10-CM | POA: Diagnosis present

## 2024-05-12 DIAGNOSIS — K529 Noninfective gastroenteritis and colitis, unspecified: Secondary | ICD-10-CM | POA: Diagnosis present

## 2024-05-12 DIAGNOSIS — Z1612 Extended spectrum beta lactamase (ESBL) resistance: Secondary | ICD-10-CM | POA: Diagnosis present

## 2024-05-12 DIAGNOSIS — Z794 Long term (current) use of insulin: Secondary | ICD-10-CM | POA: Diagnosis not present

## 2024-05-12 DIAGNOSIS — K219 Gastro-esophageal reflux disease without esophagitis: Secondary | ICD-10-CM | POA: Diagnosis present

## 2024-05-12 DIAGNOSIS — E860 Dehydration: Secondary | ICD-10-CM | POA: Diagnosis present

## 2024-05-12 DIAGNOSIS — E7800 Pure hypercholesterolemia, unspecified: Secondary | ICD-10-CM | POA: Diagnosis present

## 2024-05-12 DIAGNOSIS — A4151 Sepsis due to Escherichia coli [E. coli]: Secondary | ICD-10-CM | POA: Diagnosis present

## 2024-05-12 DIAGNOSIS — E1165 Type 2 diabetes mellitus with hyperglycemia: Secondary | ICD-10-CM | POA: Diagnosis present

## 2024-05-12 DIAGNOSIS — I471 Supraventricular tachycardia, unspecified: Secondary | ICD-10-CM | POA: Diagnosis not present

## 2024-05-12 DIAGNOSIS — E871 Hypo-osmolality and hyponatremia: Secondary | ICD-10-CM | POA: Diagnosis present

## 2024-05-12 DIAGNOSIS — A419 Sepsis, unspecified organism: Secondary | ICD-10-CM | POA: Diagnosis not present

## 2024-05-12 DIAGNOSIS — N390 Urinary tract infection, site not specified: Secondary | ICD-10-CM | POA: Diagnosis present

## 2024-05-12 DIAGNOSIS — R0602 Shortness of breath: Secondary | ICD-10-CM | POA: Diagnosis present

## 2024-05-12 DIAGNOSIS — R6520 Severe sepsis without septic shock: Secondary | ICD-10-CM | POA: Diagnosis present

## 2024-05-12 DIAGNOSIS — G901 Familial dysautonomia [Riley-Day]: Secondary | ICD-10-CM | POA: Diagnosis present

## 2024-05-12 DIAGNOSIS — M810 Age-related osteoporosis without current pathological fracture: Secondary | ICD-10-CM | POA: Diagnosis present

## 2024-05-12 LAB — BASIC METABOLIC PANEL WITH GFR
Anion gap: 9 (ref 5–15)
BUN: 17 mg/dL (ref 8–23)
CO2: 24 mmol/L (ref 22–32)
Calcium: 8.1 mg/dL — ABNORMAL LOW (ref 8.9–10.3)
Chloride: 105 mmol/L (ref 98–111)
Creatinine, Ser: 0.94 mg/dL (ref 0.44–1.00)
GFR, Estimated: >60 mL/min
Glucose, Bld: 137 mg/dL — ABNORMAL HIGH (ref 70–99)
Potassium: 3.4 mmol/L — ABNORMAL LOW (ref 3.5–5.1)
Sodium: 137 mmol/L (ref 135–145)

## 2024-05-12 LAB — GLUCOSE, CAPILLARY
Glucose-Capillary: 218 mg/dL — ABNORMAL HIGH (ref 70–99)
Glucose-Capillary: 179 mg/dL — ABNORMAL HIGH (ref 70–99)
Glucose-Capillary: 158 mg/dL — ABNORMAL HIGH (ref 70–99)
Glucose-Capillary: 120 mg/dL — ABNORMAL HIGH (ref 70–99)

## 2024-05-12 LAB — CBC
HCT: 33.1 % — ABNORMAL LOW (ref 36.0–46.0)
Hemoglobin: 10.9 g/dL — ABNORMAL LOW (ref 12.0–15.0)
MCH: 26.1 pg (ref 26.0–34.0)
MCHC: 32.9 g/dL (ref 30.0–36.0)
MCV: 79.4 fL — ABNORMAL LOW (ref 80.0–100.0)
Platelets: 203 K/uL (ref 150–400)
RBC: 4.17 MIL/uL (ref 3.87–5.11)
RDW: 14 % (ref 11.5–15.5)
WBC: 6.4 K/uL (ref 4.0–10.5)
nRBC: 0 % (ref 0.0–0.2)

## 2024-05-12 LAB — HEMOGLOBIN A1C
Hgb A1c MFr Bld: 7 % — ABNORMAL HIGH (ref 4.8–5.6)
Mean Plasma Glucose: 154.2 mg/dL

## 2024-05-12 LAB — HIV ANTIBODY (ROUTINE TESTING W REFLEX): HIV Screen 4th Generation wRfx: NONREACTIVE

## 2024-05-12 LAB — LACTIC ACID, PLASMA: Lactic Acid, Venous: 0.9 mmol/L (ref 0.5–1.9)

## 2024-05-12 LAB — TSH: TSH: 3.98 u[IU]/mL (ref 0.350–4.500)

## 2024-05-12 MED ORDER — POTASSIUM CHLORIDE CRYS ER 20 MEQ PO TBCR
40.0000 meq | EXTENDED_RELEASE_TABLET | Freq: Once | ORAL | Status: AC
  Administered 2024-05-12: 40 meq via ORAL
  Filled 2024-05-12: qty 2

## 2024-05-12 MED ORDER — SODIUM CHLORIDE 0.9 % IV SOLN
1.0000 g | Freq: Three times a day (TID) | INTRAVENOUS | Status: DC
  Administered 2024-05-12 – 2024-05-14 (×6): 1 g via INTRAVENOUS
  Filled 2024-05-12 (×7): qty 20

## 2024-05-12 MED ORDER — INSULIN GLARGINE 100 UNIT/ML ~~LOC~~ SOLN
40.0000 [IU] | Freq: Every day | SUBCUTANEOUS | Status: DC
  Administered 2024-05-12 – 2024-05-13 (×2): 40 [IU] via SUBCUTANEOUS
  Filled 2024-05-12 (×3): qty 0.4

## 2024-05-12 NOTE — Evaluation (Signed)
 Physical Therapy Evaluation Patient Details Name: Laura Gentry MRN: [id number] DOB: Sep 13, 1955 Today's Date: 05/12/2024  History of Present Illness  The pt is a 68 yo female presenting 1218 with SOB and vomiting since 12/15. Admitted for management of sepsis likely due to UTI. PMH includes: brain aneurysm s/p clipping 02/2013, bilateral cataracts, DM II, HLD,  osteoporosis, lumbosacral radiculopathy, and hypothyroidism.   Clinical Impression  Pt in bed upon arrival of PT, agreeable to evaluation at this time. Prior to admission the pt was completely independent with mobility, reports no use of DME, living in a multi-story home with her sister. The pt was able to complete bed mobility without assistance, but does benefit from supervision for safety with standing and gait due to mild sway with gait and soft BP. Pt reports taking midodrine  at home to help with BP. Pt limited in ambulation due to fatigue and concern for diarrhea, will continue to benefit from skilled PT acutely to complete stair training as pt has 10 + 15 stairs to reach her bedroom at home, anticipate she will be able to progress to home without follow up services once BP managed.   VITALS:  - supine in bed - BP: 108/51 (68); HR: 87bpm - sitting EOB - BP: 95/58 (69); HR: 89bpm - standing - BP: 90/47 (61); HR: 92bpm - standing after 3 min - BP: 106/56 (71); HR: 93bpm - sitting EOB after ambulation - BP:98/58 (70); HR: 98bpm       If plan is discharge home, recommend the following: Help with stairs or ramp for entrance   Can travel by private vehicle        Equipment Recommendations None recommended by PT  Recommendations for Other Services       Functional Status Assessment Patient has had a recent decline in their functional status and demonstrates the ability to make significant improvements in function in a reasonable and predictable amount of time.     Precautions / Restrictions Precautions Precautions:  Fall Recall of Precautions/Restrictions: Intact Precaution/Restrictions Comments: hypotension Restrictions Weight Bearing Restrictions Per Provider Order: No      Mobility  Bed Mobility Overal bed mobility: Independent             General bed mobility comments: no assist or increased time    Transfers Overall transfer level: Needs assistance Equipment used: None Transfers: Sit to/from Stand Sit to Stand: Supervision           General transfer comment: supervision due to hypotension, no UE support or assist    Ambulation/Gait Ambulation/Gait assistance: Contact guard assist Gait Distance (Feet): 35 Feet Assistive device: None Gait Pattern/deviations: Step-through pattern, Decreased stride length Gait velocity: decreased Gait velocity interpretation: <1.31 ft/sec, indicative of household ambulator   General Gait Details: slightly slowed but stable without UE support. BP soft but stable     Balance Overall balance assessment: Mild deficits observed, not formally tested                                           Pertinent Vitals/Pain Pain Assessment Pain Assessment: No/denies pain    Home Living Family/patient expects to be discharged to:: Private residence Living Arrangements: Other relatives (sister) Available Help at Discharge: Family;Available 24 hours/day Type of Home: House Home Access: Stairs to enter Entrance Stairs-Rails: None;Right;Left (none on first 5, then bilateral on 2nd 5) Entrance Stairs-Number of Steps:  5+ 5 Alternate Level Stairs-Number of Steps: 15 Home Layout: Full bath on main level;Multi-level;Bed/bath upstairs Home Equipment: Rolling Walker (2 wheels);Cane - single point;Shower seat;BSC/3in1;Grab bars - toilet;Grab bars - tub/shower Additional Comments: pt lives on 2nd floor but can stay downstairs if needed    Prior Function Prior Level of Function : Independent/Modified Independent             Mobility  Comments: doesn't use AD ADLs Comments: independent, driving     Extremity/Trunk Assessment   Upper Extremity Assessment Upper Extremity Assessment: Overall WFL for tasks assessed    Lower Extremity Assessment Lower Extremity Assessment: Overall WFL for tasks assessed    Cervical / Trunk Assessment Cervical / Trunk Assessment: Normal  Communication   Communication Communication: No apparent difficulties    Cognition Arousal: Alert Behavior During Therapy: WFL for tasks assessed/performed   PT - Cognitive impairments: No apparent impairments                         Following commands: Intact       Cueing Cueing Techniques: Verbal cues     General Comments General comments (skin integrity, edema, etc.): BP soft but stable, pt reports not taking home midodrine  for 4 days.    Exercises     Assessment/Plan    PT Assessment Patient needs continued PT services  PT Problem List Decreased activity tolerance;Decreased balance;Decreased mobility       PT Treatment Interventions DME instruction;Gait training;Stair training;Functional mobility training;Therapeutic activities;Therapeutic exercise;Balance training;Patient/family education    PT Goals (Current goals can be found in the Care Plan section)  Acute Rehab PT Goals Patient Stated Goal: return home PT Goal Formulation: With patient Time For Goal Achievement: 05/26/24 Potential to Achieve Goals: Good    Frequency Min 2X/week        AM-PAC PT 6 Clicks Mobility  Outcome Measure Help needed turning from your back to your side while in a flat bed without using bedrails?: None Help needed moving from lying on your back to sitting on the side of a flat bed without using bedrails?: None Help needed moving to and from a bed to a chair (including a wheelchair)?: A Little Help needed standing up from a chair using your arms (e.g., wheelchair or bedside chair)?: A Little Help needed to walk in hospital  room?: A Little Help needed climbing 3-5 steps with a railing? : A Little 6 Click Score: 20    End of Session Equipment Utilized During Treatment: Gait belt Activity Tolerance: Patient tolerated treatment well Patient left: in bed;with call bell/phone within reach Nurse Communication: Mobility status PT Visit Diagnosis: Unsteadiness on feet (R26.81)    Time: 8548-8484 PT Time Calculation (min) (ACUTE ONLY): 24 min   Charges:   PT Evaluation $PT Eval Low Complexity: 1 Low PT Treatments $Therapeutic Activity: 8-22 mins PT General Charges $$ ACUTE PT VISIT: 1 Visit         Izetta Call, PT, DPT   Acute Rehabilitation Department Office [phone] Secure Chat Communication Preferred  Izetta JULIANNA Call 05/12/2024, 3:28 PM

## 2024-05-12 NOTE — Progress Notes (Signed)
 " PROGRESS NOTE  Laura Gentry  DOB: 1956/05/01  PCP: Rexanne Ingle, MD FMW:[id number]  DOA: 05/11/2024  LOS: 0 days  Hospital Day: 2  Subjective: Patient was seen and examined this morning. Lying down in bed.  Able to hold clear liquid diet but remains nauseous and states that the liquid diet comes right through as diarrhea. Afebrile, heart rate in 80s and 90s, blood pressure in normal range, breathing on 2 L oxygen Labs from this morning with potassium low at 3.4, lactic acid normalized to 0.9, WBC count 6.4, hemoglobin 10.9  Brief narrative: Laura Gentry is a 68 y.o. female DM2, HLD, brain aneurysm, osteoporosis, lumbosacral radiculopathy, hypothyroidism. Patient lives at home with her sister.  At baseline, independent.  12/18, patient presented to ED today with complaint of progressively worsening shortness of breath for 1 week associated with nonproductive cough. Also reported nausea, vomiting, diarrhea leading to progressive weakness and dizziness. Has not been able to hold on any food or water 12/15, she was seen by [facility], prescribed Zofran  with no benefit.  Unable to tolerate antibiotic either. 12/16, she had CT abdomen pelvis performed which showed bilateral nonobstructive nephrolithiasis without hydronephrosis. 12/17, she felt weak, dizzy, tripped over her footing and fell, did not hit her head, did not pass out.  She had called her sister to help her to get up.  In the ED, patient is afebrile, heart rate in 90s, blood pressures 120s, breathing room air Initial labs with WC count 15.3, hemoglobin 12.7, Sodium 134, BUN/creatinine 18/1.22, glucose 213, lactic acid 2.3 Urinalysis showed hazy yellow urine with large leukocytes, rare bacteria EKG with sinus tachycardia at 103 bpm, QTc 484 ms Chest x-ray with minimal bibasilar atelectasis or scarring CT abdomen pelvis did not show any acute intra-abdominal or pelvic pathology, showed nonobstructive bilateral  nephrolithiasis without any hydronephrosis.  Also showed circumferential wall thickening of the urinary bladder -underdistention versus acute cystitis Resp virus panel pending Blood culture sent Given history of allergy to multiple antibiotics, patient was started on IV meropenem  Admitted to [facility]  Assessment/Plan: Sepsis POA  Likely UTI Presented with 1 week of progressive nausea, vomiting, diarrhea, poor oral tolerance, leading to weakness and fall Met sepsis criteria with tachypnea, tachycardia, leukocytosis, lactic acidosis Urinalysis showed hazy yellow urine with large leukocytes, rare bacteria CT abdomen pelvis as above which showed findings of cystitis.  She also has nonobstructive bilateral nephrolithiasis without hydronephrosis. Urine culture, blood culture sent, pending report Started on IV meropenem  No fever overnight, WBC count and lactic acid level normalized Continue to monitor Recent Labs  Lab 05/11/24 1035 05/11/24 1431 05/12/24 0718  WBC 15.3*  --  6.4  LATICACIDVEN  --  2.3* 0.9   Intractable nausea, vomiting, poor oral intake No evidence of bowel obstruction or colitis or hepatobiliary, pancreatic pathology Currently on scheduled IV zofran  q6h Able to hold clear liquid diet but remains nauseous and states that the liquid diet comes right through as diarrhea. I have ordered for stool studies including C. difficile. Since she is not vomiting, I will upgrade to soft diet. Avoid Imodium for now  AKI Hyponatremia Hypokalemia Sodium and creatinine level improved with hydration. Potassium level replaced today.  Obtain magnesium  and phosphorus level Recent Labs  Lab 05/11/24 1204 05/12/24 0718  NA 134* 137  K 3.5 3.4*  CL 99 105  CO2 20* 24  GLUCOSE 213* 137*  BUN 18 17  CREATININE 1.22* 0.94  CALCIUM  8.4* 8.1*    Shortness of breath  Chest x-ray without any evidence of pulm edema or pleural effusion Most recent echo from April 2025 with EF 55 to 60%, G1  DD, RV size and function normal Dyspnea probably is part of generalized weakness Continue to monitor  Hx of dysautonomia Chronic hypotension Seems to be on midodrine  5 mg twice daily. Continue the same.   Type 2 diabetes mellitus uncontrolled with hyperglycemia A1c 7 on 05/12/2024 PTA meds-Ozempic, Lantus  nightly 40 to 50 units Currently on Lantus  20 units along with SSI/Accu-Cheks. Blood sugar level climbing up.  Increase Lantus  to 40 units for tonight When appetite improves, will increase Lantus  dose Recent Labs  Lab 05/11/24 1705 05/11/24 2130 05/12/24 1036  GLUCAP 165* 185* 218*   CAD, HLD June 2025, left heart cath showed single-vessel obstructive CAD and first diagonal, too small to PCI.  Given history of autonomic dysfunction with low BP, patient was started on Ranexa  5 mg twice daily. PTA meds- Ranexa  500 mg twice daily, Lipitor 40 mg daily Continue both  Generalized weakness PT eval pending  Osteoporosis lumbosacral radiculopathy PTA meds- gabapentin  900 mg nightly Currently on gabapentin  600 mg nightly  Anxiety Continue Zoloft   GERD Protonix  daily  H/o hypothyroidism TSH normal.  Does not seem to be on Synthroid Recent Labs    09/13/23 0638 05/12/24 0718  TSH 1.616 3.980    Mobility:  independent at baseline. Weak now. PT ordered.  PT Orders: Active   PT Follow up Rec:     Goals of care:   Code Status: Full Code    DVT prophylaxis:  enoxaparin  (LOVENOX ) injection 40 mg Start: 05/12/24 0800   Antimicrobials: meropenem  IV Fluid: Reducing NS to 100 mL/h Consultants: none Family Communication: sister at at bedside today  Status: Observation Level of care:  Progressive   Patient is from: home Anticipated d/c to: Continues to have diarrhea.  Workup in progress.  Hopefully home in 1-2 days  Diet:  Diet Order             DIET SOFT Room service appropriate? Yes; Fluid consistency: Thin  Diet effective now                    Scheduled Meds:  atorvastatin   40 mg Oral QHS   enoxaparin  (LOVENOX ) injection  40 mg Subcutaneous Q24H   gabapentin   900 mg Oral QHS   insulin  aspart  0-5 Units Subcutaneous QHS   insulin  aspart  0-9 Units Subcutaneous TID WC   insulin  glargine  40 Units Subcutaneous QHS   ondansetron  (ZOFRAN ) IV  4 mg Intravenous Q6H   pantoprazole   40 mg Oral Daily   potassium chloride   40 mEq Oral Once   ranolazine   500 mg Oral BID   sertraline   25 mg Oral QHS    PRN meds: acetaminophen  **OR** acetaminophen , albuterol , bisacodyl , hydrALAZINE , oxyCODONE , polyethylene glycol   Infusions:   sodium chloride  100 mL/hr at 05/12/24 1053   meropenem  (MERREM ) IV      Antimicrobials: Anti-infectives (From admission, onward)    Start     Dose/Rate Route Frequency Ordered Stop   05/12/24 1400  meropenem  (MERREM ) 1 g in sodium chloride  0.9 % 100 mL IVPB        1 g 200 mL/hr over 30 Minutes Intravenous Every 8 hours 05/12/24 0845     05/12/24 0600  meropenem  (MERREM ) 1 g in sodium chloride  0.9 % 100 mL IVPB  Status:  Discontinued        1 g 200  mL/hr over 30 Minutes Intravenous Every 12 hours 05/11/24 1714 05/12/24 0845   05/11/24 1600  meropenem  (MERREM ) 1 g in sodium chloride  0.9 % 100 mL IVPB  Status:  Discontinued        1 g 200 mL/hr over 30 Minutes Intravenous Every 12 hours 05/11/24 1528 05/11/24 1529   05/11/24 1530  meropenem  (MERREM ) 1 g in sodium chloride  0.9 % 100 mL IVPB        1 g 200 mL/hr over 30 Minutes Intravenous  Once 05/11/24 1529 05/11/24 1716       Objective: Vitals:   05/12/24 0530 05/12/24 0958  BP: (!) 125/58 117/66  Pulse: 83 81  Resp: 18 18  Temp: 98.1 F (36.7 C) 98.2 F (36.8 C)  SpO2: 97% 98%    Intake/Output Summary (Last 24 hours) at 05/12/2024 1145 Last data filed at 05/11/2024 1525 Gross per 24 hour  Intake 1000 ml  Output --  Net 1000 ml   Filed Weights   05/11/24 1001 05/12/24 0530  Weight: 73.9 kg 78.6 kg   Weight change:  Body mass  index is 27.97 kg/m.   Physical Exam:  General exam: Pleasant, elderly Caucasian female. Skin: No rashes, lesions or ulcers. HEENT: Atraumatic, normocephalic, no obvious bleeding Lungs: Clear to auscultation bilaterally,  CVS: S1, S2, no murmur,   GI/Abd: Soft, mild right abdominal tenderness present, nondistended, bowel sound present,   CNS: Alert, awake, oriented x 3.  Slow to respond Psychiatry: Sad affect Extremities: No pedal edema, no calf tenderness.  Data Review: I have personally reviewed the laboratory data and studies available.  F/u labs ordered Unresulted Labs (From admission, onward)     Start     Ordered   05/12/24 1145  Phosphorus  Add-on,   AD       Question:  Specimen collection method  Answer:  Lab=Lab collect   05/12/24 1144   05/12/24 1145  Magnesium   Add-on,   AD       Question:  Specimen collection method  Answer:  Lab=Lab collect   05/12/24 1144   05/12/24 1053  Gastrointestinal Panel by PCR , Stool  (Gastrointestinal Panel by PCR, Stool                                                                                                                                                     **Does Not include CLOSTRIDIUM DIFFICILE testing. **If CDIFF testing is needed, place order from the C Difficile Testing order set.**)  Once,   R        05/12/24 1052   05/12/24 1053  C Difficile Quick Screen w PCR reflex  (C Difficile quick screen w PCR reflex panel )  Once, for 24 hours,   TIMED       References:    CDiff Information Tool  05/12/24 1052   05/11/24 1653  Urine Culture (for pregnant, neutropenic or urologic patients or patients with an indwelling urinary catheter)  (Urine Labs)  Add-on,   AD       Question:  Indication  Answer:  Sepsis   05/11/24 1652            Signed, Chapman Rota, MD [facility] 05/12/2024      "

## 2024-05-12 NOTE — Care Management Obs Status (Signed)
 MEDICARE OBSERVATION STATUS NOTIFICATION   Patient Details  Name: Laura Gentry MRN: [id number] Date of Birth: 02/03/1956   Medicare Observation Status Notification Given:       Vonzell Arrie Sharps 05/12/2024, 8:51 AM

## 2024-05-13 ENCOUNTER — Inpatient Hospital Stay (HOSPITAL_COMMUNITY)

## 2024-05-13 DIAGNOSIS — A4151 Sepsis due to Escherichia coli [E. coli]: Principal | ICD-10-CM

## 2024-05-13 DIAGNOSIS — N179 Acute kidney failure, unspecified: Secondary | ICD-10-CM

## 2024-05-13 LAB — CBC WITH DIFFERENTIAL/PLATELET
Abs Immature Granulocytes: 0.01 K/uL (ref 0.00–0.07)
Basophils Absolute: 0 K/uL (ref 0.0–0.1)
Basophils Relative: 1 %
Eosinophils Absolute: 0.4 K/uL (ref 0.0–0.5)
Eosinophils Relative: 8 %
HCT: 34.3 % — ABNORMAL LOW (ref 36.0–46.0)
Hemoglobin: 11.3 g/dL — ABNORMAL LOW (ref 12.0–15.0)
Immature Granulocytes: 0 %
Lymphocytes Relative: 20 %
Lymphs Abs: 1.1 K/uL (ref 0.7–4.0)
MCH: 26 pg (ref 26.0–34.0)
MCHC: 32.9 g/dL (ref 30.0–36.0)
MCV: 78.9 fL — ABNORMAL LOW (ref 80.0–100.0)
Monocytes Absolute: 0.4 K/uL (ref 0.1–1.0)
Monocytes Relative: 7 %
Neutro Abs: 3.6 K/uL (ref 1.7–7.7)
Neutrophils Relative %: 64 %
Platelets: 225 K/uL (ref 150–400)
RBC: 4.35 MIL/uL (ref 3.87–5.11)
RDW: 13.7 % (ref 11.5–15.5)
WBC: 5.5 K/uL (ref 4.0–10.5)
nRBC: 0 % (ref 0.0–0.2)

## 2024-05-13 LAB — C DIFFICILE QUICK SCREEN W PCR REFLEX
C Diff antigen: NEGATIVE
C Diff interpretation: NOT DETECTED
C Diff toxin: NEGATIVE

## 2024-05-13 LAB — BASIC METABOLIC PANEL WITH GFR
Anion gap: 11 (ref 5–15)
BUN: 12 mg/dL (ref 8–23)
CO2: 19 mmol/L — ABNORMAL LOW (ref 22–32)
Calcium: 8.2 mg/dL — ABNORMAL LOW (ref 8.9–10.3)
Chloride: 105 mmol/L (ref 98–111)
Creatinine, Ser: 1.04 mg/dL — ABNORMAL HIGH (ref 0.44–1.00)
GFR, Estimated: 58 mL/min — ABNORMAL LOW
Glucose, Bld: 122 mg/dL — ABNORMAL HIGH (ref 70–99)
Potassium: 3.5 mmol/L (ref 3.5–5.1)
Sodium: 135 mmol/L (ref 135–145)

## 2024-05-13 LAB — GASTROINTESTINAL PANEL BY PCR, STOOL (REPLACES STOOL CULTURE)

## 2024-05-13 LAB — PHOSPHORUS: Phosphorus: 1.8 mg/dL — ABNORMAL LOW (ref 2.5–4.6)

## 2024-05-13 LAB — GLUCOSE, CAPILLARY
Glucose-Capillary: 125 mg/dL — ABNORMAL HIGH (ref 70–99)
Glucose-Capillary: 148 mg/dL — ABNORMAL HIGH (ref 70–99)
Glucose-Capillary: 159 mg/dL — ABNORMAL HIGH (ref 70–99)
Glucose-Capillary: 146 mg/dL — ABNORMAL HIGH (ref 70–99)

## 2024-05-13 LAB — MAGNESIUM: Magnesium: 1.5 mg/dL — ABNORMAL LOW (ref 1.7–2.4)

## 2024-05-13 MED ORDER — ADENOSINE 6 MG/2ML IV SOLN: 6.0000 mg | Freq: Once | INTRAVENOUS | Status: AC

## 2024-05-13 MED ORDER — ADENOSINE 6 MG/2ML IV SOLN
INTRAVENOUS | Status: AC
  Administered 2024-05-13: 6 mg via INTRAVENOUS
  Filled 2024-05-13: qty 6

## 2024-05-13 MED ORDER — POTASSIUM CHLORIDE CRYS ER 20 MEQ PO TBCR
40.0000 meq | EXTENDED_RELEASE_TABLET | Freq: Once | ORAL | Status: AC
  Administered 2024-05-13: 40 meq via ORAL
  Filled 2024-05-13: qty 2

## 2024-05-13 MED ORDER — MAGNESIUM SULFATE 2 GM/50ML IV SOLN
2.0000 g | Freq: Once | INTRAVENOUS | Status: AC
  Administered 2024-05-13: 2 g via INTRAVENOUS
  Filled 2024-05-13: qty 50

## 2024-05-13 NOTE — Progress Notes (Signed)
 "  PROGRESS NOTE    Laura Gentry  FMW:[id number] DOB: 1955/10/09 DOA: 05/11/2024 PCP: Rexanne Ingle, MD   Brief Narrative: Laura Gentry is a 68 y.o. female with a history of mellitus type II, hyperlipidemia, brain aneurysm, osteoporosis, lumbosacral radiculopathy, hypothyroidism, CAD.  Patient presented secondary to nausea, vomiting, diarrhea with concern for dehydration as evidenced by associated AKI.  Patient also met sepsis criteria with concern for urinary tract infection as source.  Patient start empirically on antibiotics, blood and urine cultures obtained, IV fluids administered.  Patient's nausea and vomiting improved with supportive care, however diarrhea has continued.  GI pathogen panel and C. difficile were negative.  Urine culture growing E. coli.   Assessment and Plan:  Sepsis Present on admission. Secondary to UTI and complicated by likely dehydration from emesis and diarrhea. Blood cultures are no growth. Urine culture significant for E. Coli. -Continue antibiotics  UTI Urine culture obtained and patient started empirically on meropenem  secondary to drug allergies. Urine culture growing E. Coli -Continue meropenem  pending urine culture data  Nausea Vomiting Unclear etiology. With associated diarrhea, presumed gastroenteritis. Symptoms have resolved.  Diarrhea With associated vomiting. C. Difficile and GI pathogen panel negative. Some mild improvement in symptoms. -If no significant improvement, will consult GI to evaluate  AKI Baseline creatinine of 0.81. Creatinine of 1.22 on admission. Improved with IV fluids.  Hyponatremia Mild. Resolved with IV fluids.  Hypokalemia Mild. Resolved with potassium supplementation.  SVT Patient with episode of sustained SVT on 12/20 requiring adenosine  6 mg IV and subsequent conversion back into sinus rhythm. Per patient, she has had a history of SVT in the past. She reports having followed with cardiology for workup.  Beta-blocker not tolerated in the past secondary to hypotension.  Shortness of breath Chest x-ray obtained for evaluation and was significant for no significant infiltrate or pleural effusion but did show mild pulmonary edema. Patient with a history of diastolic heart dysfunction. Symptoms resolved without Lasix or other specific treatment.  History of dysautonomia Chronic hypotension Patient is prescribed midodrine  as an outpatient which was not resumed on admission. Blood pressure has been controlled this admission.  Diabetes mellitus type 2 Well controlled based on hemoglobin A1C of 7.0%. -Continue insulin  glargine and SSI  CAD Stable. No chest pain. -Continue Ranexa   Hyperlipidemia -Continue Lipitor  Generalized weakness PT/OT consulted and recommend no PT follow-up.  Osteoporosis Noted.  Lumbosacral radiculopathy -Continue gabapentin   Anxiety -Continue Zoloft   GERD -Continue Protonix   Acquired hypothyroidism Patient is not on medication management. On chart review, she has only had normal TSH levels.   DVT prophylaxis: Lovenox  Code Status:   Code Status: Full Code Family Communication: Sister at bedside Disposition Plan: Discharge pending improvement in diarrhea and transition to outpatient antibiotic regimen as needed   Consultants:  None  Procedures:  None  Antimicrobials: Meropenem     Subjective: Patient reports ongoing diarrhea, although it has improved slightly.  Objective: BP (!) 102/52 (BP Location: Left Arm)   Pulse 84   Temp 98 F (36.7 C) (Oral)   Resp 17   Ht 5' 6 (1.676 m)   Wt 78.6 kg   SpO2 97%   BMI 27.97 kg/m   Examination:  General exam: Appears calm and comfortable. Respiratory system: Clear to auscultation. Respiratory effort normal. Cardiovascular system: S1 & S2 heard, RRR.  Gastrointestinal system: Abdomen is nondistended, soft and nontender. Normal bowel sounds heard. Central nervous system: Alert and oriented.  No focal neurological deficits. Musculoskeletal: No edema. No  calf tenderness Psychiatry: Judgement and insight appear normal. Mood & affect appropriate.    Data Reviewed: I have personally reviewed following labs and imaging studies  CBC Lab Results  Component Value Date   WBC 5.5 05/13/2024   RBC 4.35 05/13/2024   HGB 11.3 (L) 05/13/2024   HCT 34.3 (L) 05/13/2024   MCV 78.9 (L) 05/13/2024   MCH 26.0 05/13/2024   PLT 225 05/13/2024   MCHC 32.9 05/13/2024   RDW 13.7 05/13/2024   LYMPHSABS 1.1 05/13/2024   MONOABS 0.4 05/13/2024   EOSABS 0.4 05/13/2024   BASOSABS 0.0 05/13/2024     Last metabolic panel Lab Results  Component Value Date   NA 135 05/13/2024   K 3.5 05/13/2024   CL 105 05/13/2024   CO2 19 (L) 05/13/2024   BUN 12 05/13/2024   CREATININE 1.04 (H) 05/13/2024   GLUCOSE 122 (H) 05/13/2024   GFRNONAA 58 (L) 05/13/2024   GFRAA 75 12/06/2017   CALCIUM  8.2 (L) 05/13/2024   PHOS 1.8 (L) 05/13/2024   PROT 6.8 05/11/2024   ALBUMIN 3.7 05/11/2024   LABGLOB 2.8 12/06/2017   AGRATIO 1.4 12/06/2017   BILITOT 1.3 (H) 05/11/2024   ALKPHOS 83 05/11/2024   AST 52 (H) 05/11/2024   ALT 37 05/11/2024   ANIONGAP 11 05/13/2024    GFR: Estimated Creatinine Clearance: 54 mL/min (A) (by C-G formula based on SCr of 1.04 mg/dL (H)).  Recent Results (from the past 240 hours)  Resp panel by RT-PCR (RSV, Flu A&B, Covid) Anterior Nasal Swab     Status: None   Collection Time: 05/11/24  1:41 PM   Specimen: Anterior Nasal Swab  Result Value Ref Range Status   SARS Coronavirus 2 by RT PCR NEGATIVE NEGATIVE Final   Influenza A by PCR NEGATIVE NEGATIVE Final   Influenza B by PCR NEGATIVE NEGATIVE Final    Comment: (NOTE) The Xpert Xpress SARS-CoV-2/FLU/RSV plus assay is intended as an aid in the diagnosis of influenza from Nasopharyngeal swab specimens and should not be used as a sole basis for treatment. Nasal washings and aspirates are unacceptable for Xpert Xpress  SARS-CoV-2/FLU/RSV testing.  Fact Sheet for Patients: bloggercourse.com  Fact Sheet for Healthcare Providers: seriousbroker.it  This test is not yet approved or cleared by the [place]  FDA and has been authorized for detection and/or diagnosis of SARS-CoV-2 by FDA under an Emergency Use Authorization (EUA). This EUA will remain in effect (meaning this test can be used) for the duration of the COVID-19 declaration under Section 564(b)(1) of the Act, 21 U.S.C. section 360bbb-3(b)(1), unless the authorization is terminated or revoked.     Resp Syncytial Virus by PCR NEGATIVE NEGATIVE Final    Comment: (NOTE) Fact Sheet for Patients: bloggercourse.com  Fact Sheet for Healthcare Providers: seriousbroker.it  This test is not yet approved or cleared by the [place]  FDA and has been authorized for detection and/or diagnosis of SARS-CoV-2 by FDA under an Emergency Use Authorization (EUA). This EUA will remain in effect (meaning this test can be used) for the duration of the COVID-19 declaration under Section 564(b)(1) of the Act, 21 U.S.C. section 360bbb-3(b)(1), unless the authorization is terminated or revoked.  Performed at [facility], [address] [place], [place] [address]   Blood culture (routine x 2)     Status: None (Preliminary result)   Collection Time: 05/11/24  2:08 PM   Specimen: BLOOD LEFT FOREARM  Result Value Ref Range Status   Specimen Description BLOOD LEFT FOREARM  Final   Special Requests   Final    BOTTLES DRAWN AEROBIC AND ANAEROBIC Blood Culture adequate volume   Culture   Final    NO GROWTH 2 DAYS Performed at [facility], [address], [place], [place] [address]    Report Status PENDING  Incomplete  Blood culture (routine x 2)     Status: None (Preliminary result)   Collection Time: 05/11/24  2:45 PM   Specimen: BLOOD   Result Value Ref Range Status   Specimen Description BLOOD RIGHT ANTECUBITAL  Final   Special Requests   Final    BOTTLES DRAWN AEROBIC ONLY Blood Culture results may not be optimal due to an inadequate volume of blood received in culture bottles   Culture   Final    NO GROWTH 2 DAYS Performed at [facility], [address], [place], [place] [address]    Report Status PENDING  Incomplete  Gastrointestinal Panel by PCR , Stool     Status: None   Collection Time: 05/12/24  3:44 PM   Specimen: STOOL  Result Value Ref Range Status   Campylobacter species NOT DETECTED NOT DETECTED Final   Plesimonas shigelloides NOT DETECTED NOT DETECTED Final   Salmonella species NOT DETECTED NOT DETECTED Final   Yersinia enterocolitica NOT DETECTED NOT DETECTED Final   Vibrio species NOT DETECTED NOT DETECTED Final   Vibrio cholerae NOT DETECTED NOT DETECTED Final   Enteroaggregative E coli (EAEC) NOT DETECTED NOT DETECTED Final   Enteropathogenic E coli (EPEC) NOT DETECTED NOT DETECTED Final   Enterotoxigenic E coli (ETEC) NOT DETECTED NOT DETECTED Final   Shiga like toxin producing E coli (STEC) NOT DETECTED NOT DETECTED Final   Shigella/Enteroinvasive E coli (EIEC) NOT DETECTED NOT DETECTED Final   Cryptosporidium NOT DETECTED NOT DETECTED Final   Cyclospora cayetanensis NOT DETECTED NOT DETECTED Final   Entamoeba histolytica NOT DETECTED NOT DETECTED Final   Giardia lamblia NOT DETECTED NOT DETECTED Final   Adenovirus F40/41 NOT DETECTED NOT DETECTED Final   Astrovirus NOT DETECTED NOT DETECTED Final   Norovirus GI/GII NOT DETECTED NOT DETECTED Final   Rotavirus A NOT DETECTED NOT DETECTED Final   Sapovirus (I, II, IV, and V) NOT DETECTED NOT DETECTED Final    Comment: Performed at [facility], [address] [place], [place] [address]  C Difficile Quick Screen w PCR reflex     Status: None   Collection Time: 05/12/24  3:44 PM   Specimen: STOOL  Result Value Ref Range Status    C Diff antigen NEGATIVE NEGATIVE Final   C Diff toxin NEGATIVE NEGATIVE Final   C Diff interpretation No C. difficile detected.  Final    Comment: Performed at [facility], [address], [place], [place] [address]      Radiology Studies: DG Chest Port 1 View Result Date: 05/13/2024 EXAM: 1 VIEW XRAY OF THE CHEST 05/13/2024 04:55:00 AM COMPARISON: 05/11/2024 CLINICAL HISTORY: Tachycardia FINDINGS: LUNGS AND PLEURA: Low lung volumes. Mild pulmonary edema. Mild diffuse pulmonary interstitial prominence. No focal pulmonary opacity. No pleural effusion. No pneumothorax. HEART AND MEDIASTINUM: No acute abnormality of the cardiac and mediastinal silhouettes. BONES AND SOFT TISSUES: No acute osseous abnormality. IMPRESSION: 1. Low lung volumes with mild pulmonary edema and mild diffuse pulmonary interstitial prominence. Electronically signed by: Evalene Coho MD 05/13/2024 05:21 AM EST RP Workstation: [id number]      LOS: 1 day    Elgin Lam, MD [facility] 05/13/2024,  1:37 PM   If 7PM-7AM, please contact night-coverage [email or web address]  "

## 2024-05-13 NOTE — Plan of Care (Signed)

## 2024-05-13 NOTE — Significant Event (Signed)
 Rapid Response Event Note   Reason for Call :  HR-160s  Initial Focused Assessment:  Pt lying in bed with eyes open, in no visible distress. Pt is c/o SOB, denies CP/dizziness. Lungs diminished t/o. Skin warm/dry.  HR-160s, BP-116/78, RR-20, SpO2-95% on 2L [place]  Interventions:  EKG-wide QRS tachycardia, nonspecific intraventricular conduction block Pt placed on zoll  6mg  adenosine  IV given x 1-conversion to ST-100s, SBP-130s KCL Mg PCXR Plan of Care:  Pt HR better after chemical cardioversion. Give KCL/Mg. Continue to monitor pt closely. Please call RRT if further assistance needed.   Event Summary:   MD Notified: [place] Call [phone] Arrival [phone] End [id number]  Tish Graeme Piety, RN

## 2024-05-13 NOTE — Hospital Course (Signed)
 Laura Gentry is a 68 y.o. female with a history of mellitus type II, hyperlipidemia, brain aneurysm, osteoporosis, lumbosacral radiculopathy, hypothyroidism, CAD.  Patient presented secondary to nausea, vomiting, diarrhea with concern for dehydration as evidenced by associated AKI.  Patient also met sepsis criteria with concern for urinary tract infection as source.  Patient start empirically on antibiotics, blood and urine cultures obtained, IV fluids administered.  Patient's nausea and vomiting improved with supportive care, however diarrhea has continued.  GI pathogen panel and C. difficile were negative.  Urine culture growing ESBL E. coli. Patient completed 3 days of meropenem . During hospitalization, patient developed SVT requiring adenosine . Cardiology recommendation to re-trial patient on Toprol  XL and plan to follow-up outpatient with additional plan for a heart monitor.

## 2024-05-13 NOTE — Progress Notes (Addendum)
 [facility] night cross cover note:  Patient converted to SVT with sustained heart rates in the 160s to 170s.  Other vital signs at the time remained stable, including systolic blood pressures greater than 100 mmHg. Afebrile.  She was awake and alert at the time of onset of SVT, and staff responded to this rhythm change she was noted to be laying comfortably in bed reading a book, reporting only mild shortness of breath that had started at the time of this conversion to SVT.  Denied any additional symptoms, including no chest pain.  Per the patient, she reported a history of a previous fast heart rhythm, that resolved with a dose of medication, with the patient reporting that she did not require electricity in order to convert her out of this previous rhythm.   Stat EKG ordered, and appears consistent with SVT.  I have not found any documentation of a history of paroxysmal atrial fibrillation.   I subsequently discussed, briefly, with on-call cardiology fellow of the patient's case and this EKG. Cardiology recommended adenosine .  I subsequently ordered adenosine  6 mg IV x 1 dose and was bedside for its administration.  Patient subsequently converted from SVT to sinus tachycardia with heart rates rates to 1 teens.  Post pharmacologic cardioversion EKG has been ordered.  Following conversion from SVT to sinus tachycardia, the patient reports resolution of the shortness of breath that she was experiencing while in SVT. Cxr ordered.   Labs this morning are also notable for hypomagnesemia, with magnesium  level of 1.5.  I subsequently ordered magnesium  sulfate 2 g IV over 2 hours.  May consider PO beta blocker.   Patient requested that I evaluated her home Ranexa  has been resumed.  Per my subsequent review of current medications, it appears that her home Ranexa  has been resumed, and is an active medication for her at this time.   Laura Pore, DO Hospitalist

## 2024-05-14 DIAGNOSIS — A4151 Sepsis due to Escherichia coli [E. coli]: Secondary | ICD-10-CM | POA: Diagnosis not present

## 2024-05-14 DIAGNOSIS — I471 Supraventricular tachycardia, unspecified: Secondary | ICD-10-CM

## 2024-05-14 LAB — BASIC METABOLIC PANEL WITH GFR
Anion gap: 6 (ref 5–15)
BUN: 10 mg/dL (ref 8–23)
CO2: 29 mmol/L (ref 22–32)
Calcium: 8.4 mg/dL — ABNORMAL LOW (ref 8.9–10.3)
Chloride: 105 mmol/L (ref 98–111)
Creatinine, Ser: 0.86 mg/dL (ref 0.44–1.00)
GFR, Estimated: >60 mL/min
Glucose, Bld: 134 mg/dL — ABNORMAL HIGH (ref 70–99)
Potassium: 3.9 mmol/L (ref 3.5–5.1)
Sodium: 140 mmol/L (ref 135–145)

## 2024-05-14 LAB — PHOSPHORUS: Phosphorus: 2.1 mg/dL — ABNORMAL LOW (ref 2.5–4.6)

## 2024-05-14 LAB — URINE CULTURE: Culture: 10000 — AB

## 2024-05-14 LAB — GLUCOSE, CAPILLARY
Glucose-Capillary: 143 mg/dL — ABNORMAL HIGH (ref 70–99)
Glucose-Capillary: 165 mg/dL — ABNORMAL HIGH (ref 70–99)
Glucose-Capillary: 149 mg/dL — ABNORMAL HIGH (ref 70–99)

## 2024-05-14 LAB — MAGNESIUM: Magnesium: 1.9 mg/dL (ref 1.7–2.4)

## 2024-05-14 MED ORDER — LOPERAMIDE HCL 2 MG PO CAPS: 2.0000 mg | ORAL_CAPSULE | ORAL | Status: DC | PRN

## 2024-05-14 MED ORDER — MIDODRINE HCL 5 MG PO TABS
5.0000 mg | ORAL_TABLET | Freq: Two times a day (BID) | ORAL | Status: DC
  Administered 2024-05-14: 5 mg via ORAL
  Filled 2024-05-14: qty 1

## 2024-05-14 MED ORDER — METOPROLOL SUCCINATE ER 25 MG PO TB24: 12.5000 mg | ORAL_TABLET | Freq: Every day | ORAL | 2 refills | Status: AC

## 2024-05-14 MED ORDER — K PHOS MONO-SOD PHOS DI & MONO 155-852-130 MG PO TABS
500.0000 mg | ORAL_TABLET | Freq: Once | ORAL | Status: AC
  Administered 2024-05-14: 500 mg via ORAL
  Filled 2024-05-14: qty 2

## 2024-05-14 MED ORDER — SODIUM CHLORIDE 0.9 % IV SOLN
1.0000 g | Freq: Three times a day (TID) | INTRAVENOUS | Status: AC
  Administered 2024-05-14: 1 g via INTRAVENOUS
  Filled 2024-05-14: qty 20

## 2024-05-14 MED ORDER — METOPROLOL SUCCINATE ER 25 MG PO TB24
12.5000 mg | ORAL_TABLET | Freq: Every day | ORAL | Status: DC
  Administered 2024-05-14: 12.5 mg via ORAL
  Filled 2024-05-14: qty 1

## 2024-05-14 NOTE — Discharge Instructions (Signed)
 Laura Gentry,  You were in the hospital because of an illness which is likely related to a UTI, but it also seems you may have had a GI infection. You have improved with antibiotics and IV fluids. While you were here, you also had a couple of episodes of very fast heart rates. This required a medication called adenosine . I discussed this with the on-call cardiologist, who recommends you restart metoprolol , wear a heart monitor (they will have one sent to you) and follow-up with your primary cardiologist.

## 2024-05-14 NOTE — Plan of Care (Signed)
   Problem: Skin Integrity: Goal: Risk for impaired skin integrity will decrease Outcome: Progressing

## 2024-05-14 NOTE — Progress Notes (Addendum)
 ON-CALL CARDIOLOGY 05/14/2024  Patient's name: Laura Gentry.   MRN: [id number].    DOB: 1956/01/28 Primary care provider: Rexanne Ingle, MD. Primary cardiologist: Dr. Jordan  Requesting physician:Nettey, Elgin LABOR, MD Consulting physician: Dr. Michele  Interaction regarding this patient's care today: Patient presented to the hospital with noncardiac symptoms and ruled in for sepsis secondary to urinary tract infection  Yesterday 05/13/2024 she had an episode of rapid heart rate clinically patient is asymptomatic and reading a book according to attending physician.  Rhythm consistent with supraventricular tachycardia.  Rapid response was called.  Cardiology on-call provider was notified and recommendations was to administer adenosine .  Status post adenosine  she converted to sinus tachycardia and palpitations had resolved.  According to the attending physician patient has had another episode of SVT in the past when she was sick.  According to EMR beta-blockers were not tolerated secondary to hypotension.  Patient is being considered for discharge and cardiology called for further recommendations.  Impression: Sepsis. SVT AKI. Hyponatremia. Hypokalemia  Recommendations: Rapid response was called due to narrow complex tachycardia suggestive of supraventricular rhythm.  Adenosine  administered and the rhythm converted to sinus tachycardia.  Precipitating factors likely underlying sepsis and electrolyte abnormalities and AKI.  Patient was monitored for an additional 24 hours and no reoccurrence of SVT according to attending physician  Patient is not able to tolerate beta-blockers in the past due to hypotension.  Shared decision was to consider outpatient monitor to evaluate for SVT burden and to follow-up with primary cardiologist.  If significant SVT burden EP evaluation could be considered.  No charge.   Madonna Michele HAS, [facility]  [facility]  A Division of [facility] [address], [place], [place] [address]  05/14/2024 10:42 AM   Addendum:  Primary team reached out as the patient had another asymptomatic episode of SVT noted on telemetry.  Hemodynamically stable.  Telemetry reviewed.  Recommended considering low dose Toprol  XL 12.5 mg po qAM and see how she does. Still continue with cardiac monitor and outpatient followup.  Educate patient about symptoms that would warrant return to ER.   Dr. Joleigh Mineau

## 2024-05-14 NOTE — Discharge Summary (Signed)
 " Physician Discharge Summary   Patient: Laura Gentry MRN: [id number] DOB: 10/22/1955  Admit date:     05/11/2024  Discharge date: 05/14/2024  Discharge Physician: Elgin Lam, MD   PCP: Rexanne Ingle, MD   Recommendations at discharge:  PCP visit for hospital follow-up Cardiology visit for hospital follow-up/heart monitor  Discharge Diagnoses: Principal Problem:   Sepsis [facility])  Resolved Problems:   * No resolved hospital problems. *  Hospital Course: Laura Gentry is a 68 y.o. female with a history of mellitus type II, hyperlipidemia, brain aneurysm, osteoporosis, lumbosacral radiculopathy, hypothyroidism, CAD.  Patient presented secondary to nausea, vomiting, diarrhea with concern for dehydration as evidenced by associated AKI.  Patient also met sepsis criteria with concern for urinary tract infection as source.  Patient start empirically on antibiotics, blood and urine cultures obtained, IV fluids administered.  Patient's nausea and vomiting improved with supportive care, however diarrhea has continued.  GI pathogen panel and C. difficile were negative.  Urine culture growing ESBL E. coli. Patient completed 3 days of meropenem . During hospitalization, patient developed SVT requiring adenosine . Cardiology recommendation to re-trial patient on Toprol  XL and plan to follow-up outpatient with additional plan for a heart monitor.  Assessment and Plan:  Sepsis Present on admission. Secondary to UTI and complicated by likely dehydration from emesis and diarrhea. Blood cultures are no growth. Urine culture significant for ESBL E. Coli. Patient completed 3 days of meropenem .   UTI Urine culture obtained and patient started empirically on meropenem  secondary to drug allergies. Urine culture growing ESBL E. Coli. Patient completed 3 days of meropenem .   Nausea Vomiting Unclear etiology. With associated diarrhea, presumed gastroenteritis. Symptoms have resolved.   Diarrhea With  associated vomiting. C. Difficile and GI pathogen panel negative. Symptoms improved prior to discharge.   AKI Baseline creatinine of 0.81. Creatinine of 1.22 on admission. Improved with IV fluids.   Hyponatremia Mild. Resolved with IV fluids.   Hypokalemia Mild. Resolved with potassium supplementation.   SVT Patient with episode of sustained SVT on 12/20 requiring adenosine  6 mg IV and subsequent conversion back into sinus rhythm. Per patient, she has had a history of SVT in the past. She reports having followed with cardiology for workup. Beta-blocker not tolerated in the past secondary to hypotension. Patient had another episode of non-sustained SVT consisting of about 18 beats. Discussed with cardiology who recommends re-trial of Toprol  XL 12.5 mg daily. Patient to follow-up outpatient with cardiology and a heart monitor.   Shortness of breath Chest x-ray obtained for evaluation and was significant for no significant infiltrate or pleural effusion but did show mild pulmonary edema. Patient with a history of diastolic heart dysfunction. Symptoms resolved without Lasix or other specific treatment.   History of dysautonomia Chronic hypotension Patient is prescribed midodrine  as an outpatient which was not resumed on admission. Blood pressure has been controlled this admission.   Diabetes mellitus type 2 Well controlled based on hemoglobin A1C of 7.0%. Continue home regimen.   CAD Stable. No chest pain. Continue Ranexa .   Hyperlipidemia Continue Lipitor.   Generalized weakness PT/OT consulted and recommend no PT follow-up.   Osteoporosis Noted.   Lumbosacral radiculopathy Continue gabapentin .   Anxiety Continue Zoloft .   GERD Continue Protonix .   Acquired hypothyroidism Patient is not on medication management. On chart review, she has only had normal TSH levels.   Consultants: Cardiology (curbside) Procedures performed: None  Disposition: Home Diet recommendation:  Carb modified diet   DISCHARGE MEDICATION: Allergies as  of 05/14/2024       Reactions   Amoxicillin Anaphylaxis, Shortness Of Breath   Bee Venom Shortness Of Breath   Penicillins Anaphylaxis, Swelling, Other (See Comments)   Remote reaction- Throat swelling - compromised airway Tolerates ceftriaxone    Pneumococcal Vaccines Other (See Comments)   Per patient due to Brain Aneurysm   Sulfa  Antibiotics Hives, Itching, Swelling   Bactrim  [sulfamethoxazole -trimethoprim ] Hives, Itching, Swelling, Other (See Comments)   Felt off balance   Caffeine Other (See Comments)   headache        Medication List     TAKE these medications    acetaminophen  500 MG tablet Commonly known as: TYLENOL  Take 1 tablet (500 mg total) by mouth every 6 (six) hours as needed for mild pain or headache. What changed: how much to take   alendronate  70 MG tablet Commonly known as: FOSAMAX  Take 70 mg by mouth every Monday.   atorvastatin  40 MG tablet Commonly known as: LIPITOR Take 1 tablet (40 mg total) by mouth daily. What changed: when to take this   cyanocobalamin  1000 MCG tablet Commonly known as: VITAMIN B12 Take 1,000 mcg by mouth daily.   FreeStyle Libre 3 Reader Devi 1 Application by Does not apply route.   gabapentin  300 MG capsule Commonly known as: NEURONTIN  Take 300 mg in AM, 300 mg at noon, 600 mg at bedtime What changed:  how much to take how to take this when to take this additional instructions   insulin  aspart 100 UNIT/ML injection Commonly known as: novoLOG  Inject 6-24 Units into the skin 3 (three) times daily before meals. Sliding scale:less than 100  Do not Take 100-140   = 6 units 141-180  =  10 units 181-220  =  12 units 221-260  =  14 units 261-300  =  16 units 301-340  =  18 units 341-380  =  20 units 381 > = 24 units   Lantus  SoloStar 100 UNIT/ML Solostar Pen Generic drug: insulin  glargine Inject 32 Units into the skin at bedtime. What changed:  how  much to take when to take this additional instructions   metoprolol  succinate 25 MG 24 hr tablet Commonly known as: TOPROL -XL Take 0.5 tablets (12.5 mg total) by mouth daily. Start taking on: May 15, 2024   midodrine  5 MG tablet Commonly known as: PROAMATINE  Take 1 tablet (5 mg total) by mouth 3 (three) times daily with meals. What changed: when to take this   nitrofurantoin  (macrocrystal-monohydrate) 100 MG capsule Commonly known as: MACROBID  Take 100 mg by mouth 2 (two) times daily.   Ozempic (0.25 or 0.5 MG/DOSE) 2 MG/3ML Sopn Generic drug: Semaglutide(0.25 or 0.5MG /DOS) Inject 0.5 mg into the skin every Thursday.   pantoprazole  40 MG tablet Commonly known as: PROTONIX  Take 40 mg by mouth daily.   POTASSIUM GLUCONATE PO Take 1 tablet by mouth daily.   ranolazine  500 MG 12 hr tablet Commonly known as: Ranexa  Take 1 tablet (500 mg total) by mouth 2 (two) times daily.   sertraline  25 MG tablet Commonly known as: ZOLOFT  Take 25 mg by mouth at bedtime.   thiamine  100 MG tablet Commonly known as: Vitamin B-1 Take 100 mg by mouth daily.   Vitamin D (Ergocalciferol) 1.25 MG (50000 UNIT) Caps capsule Commonly known as: DRISDOL Take 50,000 Units by mouth every 7 (seven) days.        Follow-up Information     Jordan, Peter M, MD Follow up on 06/15/2024.   Specialty: Cardiology  Why: 4 PM Our office will mail you heart monitor within the next week or so. Contact information: [address] [place] [address] [phone]         Rexanne Ingle, MD. Schedule an appointment as soon as possible for a visit in 1 week(s).   Specialty: Internal Medicine Why: For hospital follow-up Contact information: [address] [place] [address] [phone]                Discharge Exam: BP (!) 124/59 (BP Location: Left Arm)   Pulse 80   Temp 98.1 F (36.7 C) (Oral)   Resp 17   Ht 5' 6 (1.676 m)   Wt 78.6 kg   SpO2 92%   BMI  27.97 kg/m   General exam: Appears calm and comfortable. Respiratory system: Clear to auscultation. Respiratory effort normal. Cardiovascular system: S1 & S2 heard, RRR. Gastrointestinal system: Abdomen is nondistended, soft and nontender. Normal bowel sounds heard. Central nervous system: Alert and oriented. No focal neurological deficits. Musculoskeletal: No edema. No calf tenderness Psychiatry: Judgement and insight appear normal. Mood & affect appropriate.   Condition at discharge: stable  The results of significant diagnostics from this hospitalization (including imaging, microbiology, ancillary and laboratory) are listed below for reference.   Imaging Studies: DG Chest Port 1 View Result Date: 05/13/2024 EXAM: 1 VIEW XRAY OF THE CHEST 05/13/2024 04:55:00 AM COMPARISON: 05/11/2024 CLINICAL HISTORY: Tachycardia FINDINGS: LUNGS AND PLEURA: Low lung volumes. Mild pulmonary edema. Mild diffuse pulmonary interstitial prominence. No focal pulmonary opacity. No pleural effusion. No pneumothorax. HEART AND MEDIASTINUM: No acute abnormality of the cardiac and mediastinal silhouettes. BONES AND SOFT TISSUES: No acute osseous abnormality. IMPRESSION: 1. Low lung volumes with mild pulmonary edema and mild diffuse pulmonary interstitial prominence. Electronically signed by: Evalene Coho MD 05/13/2024 05:21 AM EST RP Workstation: [id number]   CT ABDOMEN PELVIS W CONTRAST Addendum Date: 05/11/2024 ADDENDUM REPORT: 05/11/2024 14:27 ADDENDUM: Circumferential wall thickening of the urinary bladder, which may be due to underdistension. If there is concern for acute cystitis, correlation with urinalysis would be recommended. Electronically Signed   By: Rogelia Myers M.D.   On: 05/11/2024 14:27   Result Date: 05/11/2024 CLINICAL DATA:  Abdominal pain, acute, nonlocalized EXAM: CT ABDOMEN AND PELVIS WITH CONTRAST TECHNIQUE: Multidetector CT imaging of the abdomen and pelvis was performed using the  standard protocol following bolus administration of intravenous contrast. RADIATION DOSE REDUCTION: This exam was performed according to the departmental dose-optimization program which includes automated exposure control, adjustment of the mA and/or kV according to patient size and/or use of iterative reconstruction technique. CONTRAST:  60mL OMNIPAQUE  IOHEXOL  350 MG/ML SOLN COMPARISON:  05/09/2024, 01/20/2022 FINDINGS: Lower chest: No focal airspace consolidation or pleural effusion.Fibrolinear scarring and dependent atelectasis in the lung bases. Hepatobiliary: No mass.Cholecystectomy. No intrahepatic or extrahepatic biliary ductal dilation. The portal veins are patent. Pancreas: No mass or main ductal dilation. No peripancreatic inflammation or fluid collection. Spleen: Normal size. No mass. Adrenals/Urinary Tract: No adrenal masses. Similar appearance of bilateral renal cysts. Punctate nonobstructive bilateral calyceal calculi. No hydronephrosis. Circumferential wall thickening of the urinary bladder. Stomach/Bowel: The stomach is decompressed without focal abnormality. No small bowel wall thickening or inflammation. No small bowel obstruction.The appendix was not visualized, possibly surgically absent. Vascular/Lymphatic: No aortic aneurysm. Diffuse aortoiliac atherosclerosis. No intraabdominal or pelvic lymphadenopathy. Reproductive: Hysterectomy. No concerning adnexal mass.No free pelvic fluid. Other: No pneumoperitoneum, ascites, or mesenteric inflammation. Musculoskeletal: No acute fracture or destructive lesion. Osteopenia. Mild degenerative  disc disease at L4-L5. Multilevel thoracolumbar osteophytosis. Redemonstrated lag screws transfixing the left femoral neck. IMPRESSION: 1. No acute intra-abdominal or pelvic abnormality. 2. Nonobstructive bilateral nephrolithiasis. No hydronephrosis in either kidney. Aortic Atherosclerosis (ICD10-I70.0). Electronically Signed: By: Rogelia Myers M.D. On: 05/11/2024  14:21   DG Chest 1 View Result Date: 05/11/2024 CLINICAL DATA:  Shortness of breath EXAM: CHEST  1 VIEW COMPARISON:  September 12, 2023 FINDINGS: The heart size and mediastinal contours are within normal limits. Minimal bibasilar subsegmental atelectasis or scarring is noted. The visualized skeletal structures are unremarkable. IMPRESSION: Minimal bibasilar subsegmental atelectasis or scarring. Electronically Signed   By: Lynwood Landy Raddle M.D.   On: 05/11/2024 11:30   CT ABDOMEN PELVIS WO CONTRAST Result Date: 05/09/2024 CLINICAL DATA:  Acute right flank pain EXAM: CT ABDOMEN AND PELVIS WITHOUT CONTRAST TECHNIQUE: Multidetector CT imaging of the abdomen and pelvis was performed following the standard protocol without IV contrast. RADIATION DOSE REDUCTION: This exam was performed according to the departmental dose-optimization program which includes automated exposure control, adjustment of the mA and/or kV according to patient size and/or use of iterative reconstruction technique. COMPARISON:  September 20, 2021. FINDINGS: Lower chest: No acute abnormality. Hepatobiliary: Status post cholecystectomy. No biliary dilatation. No hepatic focal abnormality seen on these unenhanced images. Pancreas: Unremarkable. No pancreatic ductal dilatation or surrounding inflammatory changes. Spleen: Normal in size without focal abnormality. Adrenals/Urinary Tract: Adrenal glands appear normal. Bilateral nonobstructive nephrolithiasis. Stable left renal cyst. No hydronephrosis or renal obstruction is noted. Urinary bladder is decompressed. Stomach/Bowel: No evidence of bowel obstruction or inflammation. Status post appendectomy Vascular/Lymphatic: Aortic atherosclerosis. No enlarged abdominal or pelvic lymph nodes. Reproductive: Status post hysterectomy. No adnexal masses. Other: No abdominal wall hernia or abnormality. No abdominopelvic ascites. Musculoskeletal: No acute or significant osseous findings. IMPRESSION: 1. Bilateral  nonobstructive nephrolithiasis. No hydronephrosis or renal obstruction is noted. 2. No acute abnormality seen in the abdomen or pelvis. 3. Aortic atherosclerosis. Aortic Atherosclerosis (ICD10-I70.0). Electronically Signed   By: Lynwood Landy Raddle M.D.   On: 05/09/2024 17:11    Microbiology: Results for orders placed or performed during the hospital encounter of 05/11/24  Resp panel by RT-PCR (RSV, Flu A&B, Covid) Anterior Nasal Swab     Status: None   Collection Time: 05/11/24  1:41 PM   Specimen: Anterior Nasal Swab  Result Value Ref Range Status   SARS Coronavirus 2 by RT PCR NEGATIVE NEGATIVE Final   Influenza A by PCR NEGATIVE NEGATIVE Final   Influenza B by PCR NEGATIVE NEGATIVE Final    Comment: (NOTE) The Xpert Xpress SARS-CoV-2/FLU/RSV plus assay is intended as an aid in the diagnosis of influenza from Nasopharyngeal swab specimens and should not be used as a sole basis for treatment. Nasal washings and aspirates are unacceptable for Xpert Xpress SARS-CoV-2/FLU/RSV testing.  Fact Sheet for Patients: bloggercourse.com  Fact Sheet for Healthcare Providers: seriousbroker.it  This test is not yet approved or cleared by the [place]  FDA and has been authorized for detection and/or diagnosis of SARS-CoV-2 by FDA under an Emergency Use Authorization (EUA). This EUA will remain in effect (meaning this test can be used) for the duration of the COVID-19 declaration under Section 564(b)(1) of the Act, 21 U.S.C. section 360bbb-3(b)(1), unless the authorization is terminated or revoked.     Resp Syncytial Virus by PCR NEGATIVE NEGATIVE Final    Comment: (NOTE) Fact Sheet for Patients: bloggercourse.com  Fact Sheet for Healthcare Providers: seriousbroker.it  This test is not yet approved or cleared  by the [place]  FDA and has been authorized for detection and/or diagnosis of  SARS-CoV-2 by FDA under an Emergency Use Authorization (EUA). This EUA will remain in effect (meaning this test can be used) for the duration of the COVID-19 declaration under Section 564(b)(1) of the Act, 21 U.S.C. section 360bbb-3(b)(1), unless the authorization is terminated or revoked.  Performed at [facility], [address] [place], [place] [address]   Blood culture (routine x 2)     Status: None (Preliminary result)   Collection Time: 05/11/24  2:08 PM   Specimen: BLOOD LEFT FOREARM  Result Value Ref Range Status   Specimen Description BLOOD LEFT FOREARM  Final   Special Requests   Final    BOTTLES DRAWN AEROBIC AND ANAEROBIC Blood Culture adequate volume   Culture   Final    NO GROWTH 3 DAYS Performed at [facility], [address], [place], [place] [address]    Report Status PENDING  Incomplete  Blood culture (routine x 2)     Status: None (Preliminary result)   Collection Time: 05/11/24  2:45 PM   Specimen: BLOOD  Result Value Ref Range Status   Specimen Description BLOOD RIGHT ANTECUBITAL  Final   Special Requests   Final    BOTTLES DRAWN AEROBIC ONLY Blood Culture results may not be optimal due to an inadequate volume of blood received in culture bottles   Culture   Final    NO GROWTH 3 DAYS Performed at [facility], [address], [place], [place] [address]    Report Status PENDING  Incomplete  Urine Culture (for pregnant, neutropenic or urologic patients or patients with an indwelling urinary catheter)     Status: Abnormal   Collection Time: 05/11/24  4:53 PM   Specimen: Urine, Clean Catch  Result Value Ref Range Status   Specimen Description URINE, CLEAN CATCH  Final   Special Requests   Final    NONE Performed at [facility], [address] [place], [place] [address]    Culture (A)  Final    10,000 COLONIES/mL ESCHERICHIA COLI Confirmed Extended Spectrum Beta-Lactamase Producer (ESBL).  In bloodstream infections from ESBL  organisms, carbapenems are preferred over piperacillin /tazobactam. They are shown to have a lower risk of mortality.    Report Status 05/14/2024 FINAL  Final   Organism ID, Bacteria ESCHERICHIA COLI (A)  Final      Susceptibility   Escherichia coli - MIC*    AMPICILLIN >=32 RESISTANT Resistant     CEFAZOLIN  (URINE) Value in next row Resistant      >=32 RESISTANTThis is a modified FDA-approved test that has been validated and its performance characteristics determined by the reporting laboratory.  This laboratory is certified under the Clinical Laboratory Improvement Amendments CLIA as qualified to perform high complexity clinical laboratory testing.    CEFEPIME  Value in next row Resistant      >=32 RESISTANTThis is a modified FDA-approved test that has been validated and its performance characteristics determined by the reporting laboratory.  This laboratory is certified under the Clinical Laboratory Improvement Amendments CLIA as qualified to perform high complexity clinical laboratory testing.    ERTAPENEM Value in next row Sensitive      >=32 RESISTANTThis is a modified FDA-approved test that has been validated and its performance characteristics determined by the reporting laboratory.  This laboratory is certified under the Clinical Laboratory Improvement Amendments CLIA as qualified to perform high complexity clinical laboratory  testing.    CEFTRIAXONE  Value in next row Resistant      >=32 RESISTANTThis is a modified FDA-approved test that has been validated and its performance characteristics determined by the reporting laboratory.  This laboratory is certified under the Clinical Laboratory Improvement Amendments CLIA as qualified to perform high complexity clinical laboratory testing.    CIPROFLOXACIN  Value in next row Resistant      >=32 RESISTANTThis is a modified FDA-approved test that has been validated and its performance characteristics determined by the reporting laboratory.  This  laboratory is certified under the Clinical Laboratory Improvement Amendments CLIA as qualified to perform high complexity clinical laboratory testing.    GENTAMICIN  Value in next row Sensitive      >=32 RESISTANTThis is a modified FDA-approved test that has been validated and its performance characteristics determined by the reporting laboratory.  This laboratory is certified under the Clinical Laboratory Improvement Amendments CLIA as qualified to perform high complexity clinical laboratory testing.    NITROFURANTOIN  Value in next row Sensitive      >=32 RESISTANTThis is a modified FDA-approved test that has been validated and its performance characteristics determined by the reporting laboratory.  This laboratory is certified under the Clinical Laboratory Improvement Amendments CLIA as qualified to perform high complexity clinical laboratory testing.    TRIMETH /SULFA  Value in next row Sensitive      >=32 RESISTANTThis is a modified FDA-approved test that has been validated and its performance characteristics determined by the reporting laboratory.  This laboratory is certified under the Clinical Laboratory Improvement Amendments CLIA as qualified to perform high complexity clinical laboratory testing.    AMPICILLIN/SULBACTAM Value in next row Resistant      >=32 RESISTANTThis is a modified FDA-approved test that has been validated and its performance characteristics determined by the reporting laboratory.  This laboratory is certified under the Clinical Laboratory Improvement Amendments CLIA as qualified to perform high complexity clinical laboratory testing.    PIP/TAZO Value in next row Sensitive      16 SENSITIVEThis is a modified FDA-approved test that has been validated and its performance characteristics determined by the reporting laboratory.  This laboratory is certified under the Clinical Laboratory Improvement Amendments CLIA as qualified to perform high complexity clinical laboratory testing.     MEROPENEM  Value in next row Sensitive      16 SENSITIVEThis is a modified FDA-approved test that has been validated and its performance characteristics determined by the reporting laboratory.  This laboratory is certified under the Clinical Laboratory Improvement Amendments CLIA as qualified to perform high complexity clinical laboratory testing.    * 10,000 COLONIES/mL ESCHERICHIA COLI  Gastrointestinal Panel by PCR , Stool     Status: None   Collection Time: 05/12/24  3:44 PM   Specimen: STOOL  Result Value Ref Range Status   Campylobacter species NOT DETECTED NOT DETECTED Final   Plesimonas shigelloides NOT DETECTED NOT DETECTED Final   Salmonella species NOT DETECTED NOT DETECTED Final   Yersinia enterocolitica NOT DETECTED NOT DETECTED Final   Vibrio species NOT DETECTED NOT DETECTED Final   Vibrio cholerae NOT DETECTED NOT DETECTED Final   Enteroaggregative E coli (EAEC) NOT DETECTED NOT DETECTED Final   Enteropathogenic E coli (EPEC) NOT DETECTED NOT DETECTED Final   Enterotoxigenic E coli (ETEC) NOT DETECTED NOT DETECTED Final   Shiga like toxin producing E coli (STEC) NOT DETECTED NOT DETECTED Final   Shigella/Enteroinvasive E coli (EIEC) NOT DETECTED NOT DETECTED Final   Cryptosporidium NOT DETECTED NOT  DETECTED Final   Cyclospora cayetanensis NOT DETECTED NOT DETECTED Final   Entamoeba histolytica NOT DETECTED NOT DETECTED Final   Giardia lamblia NOT DETECTED NOT DETECTED Final   Adenovirus F40/41 NOT DETECTED NOT DETECTED Final   Astrovirus NOT DETECTED NOT DETECTED Final   Norovirus GI/GII NOT DETECTED NOT DETECTED Final   Rotavirus A NOT DETECTED NOT DETECTED Final   Sapovirus (I, II, IV, and V) NOT DETECTED NOT DETECTED Final    Comment: Performed at [facility], [address], [place], [place] [address]  C Difficile Quick Screen w PCR reflex     Status: None   Collection Time: 05/12/24  3:44 PM   Specimen: STOOL  Result Value Ref Range Status   C Diff  antigen NEGATIVE NEGATIVE Final   C Diff toxin NEGATIVE NEGATIVE Final   C Diff interpretation No C. difficile detected.  Final    Comment: Performed at [facility], [address] [place], [place] [address]    Labs: CBC: Recent Labs  Lab 05/11/24 1035 05/12/24 0718 05/13/24 0342  WBC 15.3* 6.4 5.5  NEUTROABS 14.3*  --  3.6  HGB 12.7 10.9* 11.3*  HCT 38.7 33.1* 34.3*  MCV 79.5* 79.4* 78.9*  PLT 263 203 225   Basic Metabolic Panel: Recent Labs  Lab 05/11/24 1204 05/12/24 0718 05/13/24 0342 05/14/24 0441  NA 134* 137 135 140  K 3.5 3.4* 3.5 3.9  CL 99 105 105 105  CO2 20* 24 19* 29  GLUCOSE 213* 137* 122* 134*  BUN 18 17 12 10   CREATININE 1.22* 0.94 1.04* 0.86  CALCIUM  8.4* 8.1* 8.2* 8.4*  MG  --   --  1.5* 1.9  PHOS  --   --  1.8* 2.1*   Liver Function Tests: Recent Labs  Lab 05/11/24 1204  AST 52*  ALT 37  ALKPHOS 83  BILITOT 1.3*  PROT 6.8  ALBUMIN 3.7   CBG: Recent Labs  Lab 05/13/24 1314 05/13/24 1652 05/13/24 2103 05/14/24 0610 05/14/24 1122  GLUCAP 148* 159* 146* 143* 165*    Discharge time spent: 35 minutes.  Signed: Elgin Lam, MD [facility] 05/14/2024 "

## 2024-05-14 NOTE — Plan of Care (Signed)

## 2024-05-15 ENCOUNTER — Encounter: Payer: Self-pay | Admitting: Cardiology

## 2024-05-15 ENCOUNTER — Ambulatory Visit: Attending: Nurse Practitioner

## 2024-05-15 DIAGNOSIS — I471 Supraventricular tachycardia, unspecified: Secondary | ICD-10-CM

## 2024-05-15 NOTE — Progress Notes (Unsigned)
Enrolled for [employer] to mail a ZIO AT Live Telemetry monitor to patients address on file.   Dr [place] to read.

## 2024-05-15 NOTE — Telephone Encounter (Signed)
 Hi,  I did not see her in consult - it was curbside decision regarding her rhythm.  I was told she is asymptomatic with her PSVT.  No syncope.  Cardiac monitor was recommended.   From cardiac standpoint would advise monitoring symptom (if any) and not to drive if symptomatic. Whether she is cleared due to non-cardiac causes will defer to her PCP.   Dr. Agustus Mane

## 2024-05-16 LAB — CULTURE, BLOOD (ROUTINE X 2)
Culture: NO GROWTH
Culture: NO GROWTH
Special Requests: ADEQUATE

## 2024-05-18 ENCOUNTER — Encounter (HOSPITAL_COMMUNITY): Payer: Self-pay | Admitting: Emergency Medicine

## 2024-05-18 ENCOUNTER — Other Ambulatory Visit: Payer: Self-pay

## 2024-05-18 ENCOUNTER — Inpatient Hospital Stay (HOSPITAL_COMMUNITY)
Admission: EM | Admit: 2024-05-18 | Discharge: 2024-05-23 | DRG: 394 | Disposition: A | Discharge status: Home / Self Care | Attending: Internal Medicine | Admitting: Internal Medicine

## 2024-05-18 DIAGNOSIS — Z860100 Personal history of colon polyps, unspecified: Secondary | ICD-10-CM

## 2024-05-18 DIAGNOSIS — T361X5A Adverse effect of cephalosporins and other beta-lactam antibiotics, initial encounter: Secondary | ICD-10-CM | POA: Diagnosis present

## 2024-05-18 DIAGNOSIS — E785 Hyperlipidemia, unspecified: Secondary | ICD-10-CM | POA: Diagnosis present

## 2024-05-18 DIAGNOSIS — R7989 Other specified abnormal findings of blood chemistry: Secondary | ICD-10-CM | POA: Diagnosis present

## 2024-05-18 DIAGNOSIS — Z8249 Family history of ischemic heart disease and other diseases of the circulatory system: Secondary | ICD-10-CM

## 2024-05-18 DIAGNOSIS — E1142 Type 2 diabetes mellitus with diabetic polyneuropathy: Secondary | ICD-10-CM | POA: Diagnosis present

## 2024-05-18 DIAGNOSIS — Z7985 Long-term (current) use of injectable non-insulin antidiabetic drugs: Secondary | ICD-10-CM

## 2024-05-18 DIAGNOSIS — I509 Heart failure, unspecified: Principal | ICD-10-CM

## 2024-05-18 DIAGNOSIS — Z90710 Acquired absence of both cervix and uterus: Secondary | ICD-10-CM

## 2024-05-18 DIAGNOSIS — Z87442 Personal history of urinary calculi: Secondary | ICD-10-CM

## 2024-05-18 DIAGNOSIS — D509 Iron deficiency anemia, unspecified: Secondary | ICD-10-CM | POA: Diagnosis present

## 2024-05-18 DIAGNOSIS — E7849 Other hyperlipidemia: Secondary | ICD-10-CM | POA: Diagnosis present

## 2024-05-18 DIAGNOSIS — Z880 Allergy status to penicillin: Secondary | ICD-10-CM

## 2024-05-18 DIAGNOSIS — Z87891 Personal history of nicotine dependence: Secondary | ICD-10-CM

## 2024-05-18 DIAGNOSIS — Z9109 Other allergy status, other than to drugs and biological substances: Secondary | ICD-10-CM

## 2024-05-18 DIAGNOSIS — I471 Supraventricular tachycardia, unspecified: Secondary | ICD-10-CM | POA: Diagnosis present

## 2024-05-18 DIAGNOSIS — E1165 Type 2 diabetes mellitus with hyperglycemia: Secondary | ICD-10-CM | POA: Diagnosis present

## 2024-05-18 DIAGNOSIS — D72829 Elevated white blood cell count, unspecified: Secondary | ICD-10-CM | POA: Diagnosis present

## 2024-05-18 DIAGNOSIS — Z7983 Long term (current) use of bisphosphonates: Secondary | ICD-10-CM

## 2024-05-18 DIAGNOSIS — E876 Hypokalemia: Secondary | ICD-10-CM | POA: Diagnosis present

## 2024-05-18 DIAGNOSIS — Z833 Family history of diabetes mellitus: Secondary | ICD-10-CM

## 2024-05-18 DIAGNOSIS — Z881 Allergy status to other antibiotic agents status: Secondary | ICD-10-CM

## 2024-05-18 DIAGNOSIS — R197 Diarrhea, unspecified: Secondary | ICD-10-CM | POA: Diagnosis present

## 2024-05-18 DIAGNOSIS — E119 Type 2 diabetes mellitus without complications: Secondary | ICD-10-CM

## 2024-05-18 DIAGNOSIS — Z887 Allergy status to serum and vaccine status: Secondary | ICD-10-CM

## 2024-05-18 DIAGNOSIS — Z801 Family history of malignant neoplasm of trachea, bronchus and lung: Secondary | ICD-10-CM

## 2024-05-18 DIAGNOSIS — E1169 Type 2 diabetes mellitus with other specified complication: Secondary | ICD-10-CM | POA: Diagnosis present

## 2024-05-18 DIAGNOSIS — Z91030 Bee allergy status: Secondary | ICD-10-CM

## 2024-05-18 DIAGNOSIS — M810 Age-related osteoporosis without current pathological fracture: Secondary | ICD-10-CM | POA: Diagnosis present

## 2024-05-18 DIAGNOSIS — E860 Dehydration: Secondary | ICD-10-CM | POA: Diagnosis present

## 2024-05-18 DIAGNOSIS — Z803 Family history of malignant neoplasm of breast: Secondary | ICD-10-CM

## 2024-05-18 DIAGNOSIS — Z800 Family history of malignant neoplasm of digestive organs: Secondary | ICD-10-CM

## 2024-05-18 DIAGNOSIS — Z79899 Other long term (current) drug therapy: Secondary | ICD-10-CM

## 2024-05-18 DIAGNOSIS — Z794 Long term (current) use of insulin: Secondary | ICD-10-CM

## 2024-05-18 DIAGNOSIS — Z9049 Acquired absence of other specified parts of digestive tract: Secondary | ICD-10-CM

## 2024-05-18 DIAGNOSIS — Z808 Family history of malignant neoplasm of other organs or systems: Secondary | ICD-10-CM

## 2024-05-18 DIAGNOSIS — N179 Acute kidney failure, unspecified: Secondary | ICD-10-CM | POA: Diagnosis present

## 2024-05-18 DIAGNOSIS — F32A Depression, unspecified: Secondary | ICD-10-CM | POA: Diagnosis present

## 2024-05-18 DIAGNOSIS — Z8659 Personal history of other mental and behavioral disorders: Secondary | ICD-10-CM

## 2024-05-18 DIAGNOSIS — Z882 Allergy status to sulfonamides status: Secondary | ICD-10-CM

## 2024-05-18 DIAGNOSIS — Z825 Family history of asthma and other chronic lower respiratory diseases: Secondary | ICD-10-CM

## 2024-05-18 DIAGNOSIS — Z85828 Personal history of other malignant neoplasm of skin: Secondary | ICD-10-CM

## 2024-05-18 DIAGNOSIS — E039 Hypothyroidism, unspecified: Secondary | ICD-10-CM | POA: Diagnosis present

## 2024-05-18 DIAGNOSIS — I959 Hypotension, unspecified: Secondary | ICD-10-CM | POA: Diagnosis present

## 2024-05-18 DIAGNOSIS — K521 Toxic gastroenteritis and colitis: Principal | ICD-10-CM | POA: Diagnosis present

## 2024-05-18 DIAGNOSIS — E7800 Pure hypercholesterolemia, unspecified: Secondary | ICD-10-CM | POA: Diagnosis present

## 2024-05-18 DIAGNOSIS — Z87440 Personal history of urinary (tract) infections: Secondary | ICD-10-CM

## 2024-05-18 DIAGNOSIS — I2510 Atherosclerotic heart disease of native coronary artery without angina pectoris: Secondary | ICD-10-CM | POA: Diagnosis present

## 2024-05-18 LAB — COMPREHENSIVE METABOLIC PANEL WITH GFR
ALT: 22 U/L (ref 0–44)
AST: 18 U/L (ref 15–41)
Albumin: 3.3 g/dL — ABNORMAL LOW (ref 3.5–5.0)
Alkaline Phosphatase: 104 U/L (ref 38–126)
Anion gap: 13 (ref 5–15)
BUN: 24 mg/dL — ABNORMAL HIGH (ref 8–23)
CO2: 26 mmol/L (ref 22–32)
Calcium: 8.4 mg/dL — ABNORMAL LOW (ref 8.9–10.3)
Chloride: 95 mmol/L — ABNORMAL LOW (ref 98–111)
Creatinine, Ser: 1.23 mg/dL — ABNORMAL HIGH (ref 0.44–1.00)
GFR, Estimated: 47 mL/min — ABNORMAL LOW
Glucose, Bld: 148 mg/dL — ABNORMAL HIGH (ref 70–99)
Potassium: 3.4 mmol/L — ABNORMAL LOW (ref 3.5–5.1)
Sodium: 134 mmol/L — ABNORMAL LOW (ref 135–145)
Total Bilirubin: 1.2 mg/dL (ref 0.0–1.2)
Total Protein: 6.5 g/dL (ref 6.5–8.1)

## 2024-05-18 LAB — CBC
HCT: 35.2 % — ABNORMAL LOW (ref 36.0–46.0)
Hemoglobin: 11.5 g/dL — ABNORMAL LOW (ref 12.0–15.0)
MCH: 25.8 pg — ABNORMAL LOW (ref 26.0–34.0)
MCHC: 32.7 g/dL (ref 30.0–36.0)
MCV: 78.9 fL — ABNORMAL LOW (ref 80.0–100.0)
Platelets: 360 K/uL (ref 150–400)
RBC: 4.46 MIL/uL (ref 3.87–5.11)
RDW: 13.8 % (ref 11.5–15.5)
WBC: 18.6 K/uL — ABNORMAL HIGH (ref 4.0–10.5)
nRBC: 0 % (ref 0.0–0.2)

## 2024-05-18 LAB — LIPASE, BLOOD: Lipase: 29 U/L (ref 11–51)

## 2024-05-18 MED ORDER — ONDANSETRON 4 MG PO TBDP
4.0000 mg | ORAL_TABLET | Freq: Once | ORAL | Status: AC | PRN
  Administered 2024-05-18: 4 mg via ORAL
  Filled 2024-05-18: qty 1

## 2024-05-18 NOTE — ED Triage Notes (Signed)
 Patient reports persistent diarrhea with nausea this week , no emesis or fever .

## 2024-05-19 ENCOUNTER — Other Ambulatory Visit: Payer: Self-pay

## 2024-05-19 ENCOUNTER — Emergency Department (HOSPITAL_COMMUNITY)

## 2024-05-19 DIAGNOSIS — F32A Depression, unspecified: Secondary | ICD-10-CM | POA: Diagnosis present

## 2024-05-19 DIAGNOSIS — R197 Diarrhea, unspecified: Secondary | ICD-10-CM | POA: Diagnosis present

## 2024-05-19 DIAGNOSIS — I2583 Coronary atherosclerosis due to lipid rich plaque: Secondary | ICD-10-CM | POA: Diagnosis not present

## 2024-05-19 DIAGNOSIS — Z8249 Family history of ischemic heart disease and other diseases of the circulatory system: Secondary | ICD-10-CM | POA: Diagnosis not present

## 2024-05-19 DIAGNOSIS — E1142 Type 2 diabetes mellitus with diabetic polyneuropathy: Secondary | ICD-10-CM | POA: Diagnosis present

## 2024-05-19 DIAGNOSIS — D72829 Elevated white blood cell count, unspecified: Secondary | ICD-10-CM | POA: Diagnosis present

## 2024-05-19 DIAGNOSIS — E1169 Type 2 diabetes mellitus with other specified complication: Secondary | ICD-10-CM | POA: Diagnosis present

## 2024-05-19 DIAGNOSIS — N179 Acute kidney failure, unspecified: Secondary | ICD-10-CM | POA: Diagnosis present

## 2024-05-19 DIAGNOSIS — E876 Hypokalemia: Secondary | ICD-10-CM | POA: Insufficient documentation

## 2024-05-19 DIAGNOSIS — Z7985 Long-term (current) use of injectable non-insulin antidiabetic drugs: Secondary | ICD-10-CM | POA: Diagnosis not present

## 2024-05-19 DIAGNOSIS — Z794 Long term (current) use of insulin: Secondary | ICD-10-CM | POA: Diagnosis not present

## 2024-05-19 DIAGNOSIS — M810 Age-related osteoporosis without current pathological fracture: Secondary | ICD-10-CM | POA: Diagnosis present

## 2024-05-19 DIAGNOSIS — E785 Hyperlipidemia, unspecified: Secondary | ICD-10-CM | POA: Diagnosis not present

## 2024-05-19 DIAGNOSIS — R7989 Other specified abnormal findings of blood chemistry: Secondary | ICD-10-CM | POA: Insufficient documentation

## 2024-05-19 DIAGNOSIS — Z833 Family history of diabetes mellitus: Secondary | ICD-10-CM | POA: Diagnosis not present

## 2024-05-19 DIAGNOSIS — E7800 Pure hypercholesterolemia, unspecified: Secondary | ICD-10-CM | POA: Diagnosis present

## 2024-05-19 DIAGNOSIS — A09 Infectious gastroenteritis and colitis, unspecified: Secondary | ICD-10-CM | POA: Diagnosis not present

## 2024-05-19 DIAGNOSIS — K521 Toxic gastroenteritis and colitis: Secondary | ICD-10-CM | POA: Diagnosis present

## 2024-05-19 DIAGNOSIS — I959 Hypotension, unspecified: Secondary | ICD-10-CM | POA: Diagnosis not present

## 2024-05-19 DIAGNOSIS — Z87891 Personal history of nicotine dependence: Secondary | ICD-10-CM | POA: Diagnosis not present

## 2024-05-19 DIAGNOSIS — D509 Iron deficiency anemia, unspecified: Secondary | ICD-10-CM | POA: Diagnosis present

## 2024-05-19 DIAGNOSIS — Z8659 Personal history of other mental and behavioral disorders: Secondary | ICD-10-CM | POA: Diagnosis not present

## 2024-05-19 DIAGNOSIS — I2510 Atherosclerotic heart disease of native coronary artery without angina pectoris: Secondary | ICD-10-CM | POA: Diagnosis present

## 2024-05-19 DIAGNOSIS — E1165 Type 2 diabetes mellitus with hyperglycemia: Secondary | ICD-10-CM | POA: Diagnosis present

## 2024-05-19 DIAGNOSIS — I471 Supraventricular tachycardia, unspecified: Secondary | ICD-10-CM | POA: Diagnosis present

## 2024-05-19 DIAGNOSIS — E039 Hypothyroidism, unspecified: Secondary | ICD-10-CM | POA: Diagnosis present

## 2024-05-19 DIAGNOSIS — E860 Dehydration: Secondary | ICD-10-CM | POA: Diagnosis present

## 2024-05-19 DIAGNOSIS — Z7983 Long term (current) use of bisphosphonates: Secondary | ICD-10-CM | POA: Diagnosis not present

## 2024-05-19 DIAGNOSIS — Z79899 Other long term (current) drug therapy: Secondary | ICD-10-CM | POA: Diagnosis not present

## 2024-05-19 DIAGNOSIS — Z85828 Personal history of other malignant neoplasm of skin: Secondary | ICD-10-CM | POA: Diagnosis not present

## 2024-05-19 LAB — I-STAT CG4 LACTIC ACID, ED: Lactic Acid, Venous: 1.8 mmol/L (ref 0.5–1.9)

## 2024-05-19 LAB — URINALYSIS, ROUTINE W REFLEX MICROSCOPIC
Bilirubin Urine: NEGATIVE
Glucose, UA: NEGATIVE mg/dL
Hgb urine dipstick: NEGATIVE
Ketones, ur: 5 mg/dL — AB
Nitrite: NEGATIVE
Protein, ur: NEGATIVE mg/dL
Specific Gravity, Urine: 1.012 (ref 1.005–1.030)
pH: 5 (ref 5.0–8.0)

## 2024-05-19 LAB — PRO BRAIN NATRIURETIC PEPTIDE: Pro Brain Natriuretic Peptide: 2053 pg/mL — ABNORMAL HIGH

## 2024-05-19 LAB — GLUCOSE, CAPILLARY
Glucose-Capillary: 171 mg/dL — ABNORMAL HIGH (ref 70–99)
Glucose-Capillary: 181 mg/dL — ABNORMAL HIGH (ref 70–99)

## 2024-05-19 LAB — MAGNESIUM: Magnesium: 1.4 mg/dL — ABNORMAL LOW (ref 1.7–2.4)

## 2024-05-19 LAB — PHOSPHORUS: Phosphorus: 3.1 mg/dL (ref 2.5–4.6)

## 2024-05-19 LAB — CBG MONITORING, ED
Glucose-Capillary: 161 mg/dL — ABNORMAL HIGH (ref 70–99)
Glucose-Capillary: 180 mg/dL — ABNORMAL HIGH (ref 70–99)

## 2024-05-19 LAB — MRSA NEXT GEN BY PCR, NASAL: MRSA by PCR Next Gen: NOT DETECTED

## 2024-05-19 LAB — PROTIME-INR
INR: 1.1 (ref 0.8–1.2)
Prothrombin Time: 15.2 s (ref 11.4–15.2)

## 2024-05-19 LAB — TROPONIN T, HIGH SENSITIVITY: Troponin T High Sensitivity: 31 ng/L — ABNORMAL HIGH (ref 0–19)

## 2024-05-19 MED ORDER — ONDANSETRON HCL 4 MG/2ML IJ SOLN
INTRAMUSCULAR | Status: AC
  Filled 2024-05-19: qty 2

## 2024-05-19 MED ORDER — INSULIN ASPART 100 UNIT/ML IJ SOLN
0.0000 [IU] | Freq: Three times a day (TID) | INTRAMUSCULAR | Status: DC
  Administered 2024-05-19 (×3): 3 [IU] via SUBCUTANEOUS
  Administered 2024-05-20 (×2): 5 [IU] via SUBCUTANEOUS
  Administered 2024-05-20: 3 [IU] via SUBCUTANEOUS
  Administered 2024-05-21: 2 [IU] via SUBCUTANEOUS
  Administered 2024-05-21 (×2): 3 [IU] via SUBCUTANEOUS
  Administered 2024-05-22: 2 [IU] via SUBCUTANEOUS
  Administered 2024-05-22 – 2024-05-23 (×3): 3 [IU] via SUBCUTANEOUS
  Filled 2024-05-19 (×3): qty 3
  Filled 2024-05-19: qty 2
  Filled 2024-05-19: qty 5
  Filled 2024-05-19 (×2): qty 3
  Filled 2024-05-19: qty 5
  Filled 2024-05-19: qty 3

## 2024-05-19 MED ORDER — FLORANEX PO PACK
1.0000 g | PACK | Freq: Three times a day (TID) | ORAL | Status: DC
  Administered 2024-05-19 – 2024-05-23 (×8): 1 g via ORAL
  Filled 2024-05-19 (×10): qty 1

## 2024-05-19 MED ORDER — SODIUM CHLORIDE 0.9% FLUSH
3.0000 mL | Freq: Two times a day (BID) | INTRAVENOUS | Status: DC
  Administered 2024-05-19 – 2024-05-23 (×7): 3 mL via INTRAVENOUS

## 2024-05-19 MED ORDER — METRONIDAZOLE 500 MG/100ML IV SOLN
500.0000 mg | Freq: Two times a day (BID) | INTRAVENOUS | Status: DC
  Administered 2024-05-19: 500 mg via INTRAVENOUS
  Filled 2024-05-19: qty 100

## 2024-05-19 MED ORDER — ONDANSETRON HCL 4 MG/2ML IJ SOLN
4.0000 mg | Freq: Once | INTRAMUSCULAR | Status: AC
  Administered 2024-05-19: 4 mg via INTRAVENOUS

## 2024-05-19 MED ORDER — LACTATED RINGERS IV BOLUS
1000.0000 mL | Freq: Once | INTRAVENOUS | Status: AC
  Administered 2024-05-19: 1000 mL via INTRAVENOUS

## 2024-05-19 MED ORDER — ONDANSETRON HCL 4 MG PO TABS: 4.0000 mg | ORAL_TABLET | Freq: Four times a day (QID) | ORAL | Status: DC | PRN

## 2024-05-19 MED ORDER — HYDROMORPHONE HCL 1 MG/ML IJ SOLN: 0.5000 mg | INTRAMUSCULAR | Status: DC | PRN

## 2024-05-19 MED ORDER — OXYCODONE HCL 5 MG PO TABS: 5.0000 mg | ORAL_TABLET | ORAL | Status: DC | PRN

## 2024-05-19 MED ORDER — TRAZODONE HCL 50 MG PO TABS: 25.0000 mg | ORAL_TABLET | Freq: Every evening | ORAL | Status: DC | PRN

## 2024-05-19 MED ORDER — ACETAMINOPHEN 650 MG RE SUPP: 650.0000 mg | Freq: Four times a day (QID) | RECTAL | Status: DC | PRN

## 2024-05-19 MED ORDER — SODIUM CHLORIDE 0.9% FLUSH
3.0000 mL | Freq: Two times a day (BID) | INTRAVENOUS | Status: DC
  Administered 2024-05-19 – 2024-05-23 (×5): 3 mL via INTRAVENOUS

## 2024-05-19 MED ORDER — SERTRALINE HCL 25 MG PO TABS
25.0000 mg | ORAL_TABLET | Freq: Every day | ORAL | Status: DC
  Administered 2024-05-19 – 2024-05-22 (×4): 25 mg via ORAL
  Filled 2024-05-19 (×3): qty 1

## 2024-05-19 MED ORDER — HYDRALAZINE HCL 20 MG/ML IJ SOLN: 10.0000 mg | INTRAMUSCULAR | Status: DC | PRN

## 2024-05-19 MED ORDER — ATORVASTATIN CALCIUM 40 MG PO TABS
40.0000 mg | ORAL_TABLET | Freq: Every day | ORAL | Status: DC
  Administered 2024-05-19 – 2024-05-22 (×4): 40 mg via ORAL
  Filled 2024-05-19 (×3): qty 1

## 2024-05-19 MED ORDER — METOPROLOL SUCCINATE ER 25 MG PO TB24
12.5000 mg | ORAL_TABLET | Freq: Every day | ORAL | Status: DC
  Administered 2024-05-19 – 2024-05-23 (×5): 12.5 mg via ORAL
  Filled 2024-05-19 (×3): qty 1

## 2024-05-19 MED ORDER — ACETAMINOPHEN 325 MG PO TABS: 650.0000 mg | ORAL_TABLET | Freq: Four times a day (QID) | ORAL | Status: DC | PRN

## 2024-05-19 MED ORDER — VANCOMYCIN HCL 125 MG PO CAPS
125.0000 mg | ORAL_CAPSULE | Freq: Three times a day (TID) | ORAL | Status: DC
  Administered 2024-05-19 – 2024-05-21 (×9): 125 mg via ORAL
  Filled 2024-05-19 (×13): qty 1

## 2024-05-19 MED ORDER — HEPARIN SODIUM (PORCINE) 5000 UNIT/ML IJ SOLN
5000.0000 [IU] | Freq: Three times a day (TID) | INTRAMUSCULAR | Status: DC
  Administered 2024-05-19 – 2024-05-20 (×3): 5000 [IU] via SUBCUTANEOUS
  Filled 2024-05-19 (×3): qty 1

## 2024-05-19 MED ORDER — INSULIN GLARGINE 100 UNIT/ML ~~LOC~~ SOLN
15.0000 [IU] | Freq: Every day | SUBCUTANEOUS | Status: DC
  Administered 2024-05-19 – 2024-05-22 (×4): 15 [IU] via SUBCUTANEOUS
  Filled 2024-05-19 (×4): qty 0.15

## 2024-05-19 MED ORDER — METRONIDAZOLE 500 MG/100ML IV SOLN
500.0000 mg | Freq: Three times a day (TID) | INTRAVENOUS | Status: DC
  Administered 2024-05-19 – 2024-05-20 (×3): 500 mg via INTRAVENOUS
  Filled 2024-05-19 (×3): qty 100

## 2024-05-19 MED ORDER — SENNOSIDES-DOCUSATE SODIUM 8.6-50 MG PO TABS: 1.0000 | ORAL_TABLET | Freq: Every evening | ORAL | Status: DC | PRN

## 2024-05-19 MED ORDER — ONDANSETRON HCL 4 MG/2ML IJ SOLN: 4.0000 mg | Freq: Four times a day (QID) | INTRAMUSCULAR | Status: DC | PRN

## 2024-05-19 MED ORDER — FLEET ENEMA RE ENEM: 1.0000 | ENEMA | Freq: Once | RECTAL | Status: DC | PRN

## 2024-05-19 MED ORDER — RANOLAZINE ER 500 MG PO TB12
500.0000 mg | ORAL_TABLET | Freq: Two times a day (BID) | ORAL | Status: DC
  Administered 2024-05-19 – 2024-05-23 (×9): 500 mg via ORAL
  Filled 2024-05-19 (×6): qty 1

## 2024-05-19 MED ORDER — POTASSIUM CHLORIDE CRYS ER 20 MEQ PO TBCR
40.0000 meq | EXTENDED_RELEASE_TABLET | Freq: Once | ORAL | Status: AC
  Administered 2024-05-19: 40 meq via ORAL
  Filled 2024-05-19: qty 2

## 2024-05-19 MED ORDER — GABAPENTIN 400 MG PO CAPS
400.0000 mg | ORAL_CAPSULE | Freq: Every day | ORAL | Status: DC
  Administered 2024-05-19 – 2024-05-22 (×4): 400 mg via ORAL
  Filled 2024-05-19 (×3): qty 1

## 2024-05-19 MED ORDER — IPRATROPIUM BROMIDE 0.02 % IN SOLN: 0.5000 mg | Freq: Four times a day (QID) | RESPIRATORY_TRACT | Status: DC | PRN

## 2024-05-19 MED ORDER — MIDODRINE HCL 5 MG PO TABS
5.0000 mg | ORAL_TABLET | Freq: Three times a day (TID) | ORAL | Status: DC
  Administered 2024-05-19 – 2024-05-23 (×12): 5 mg via ORAL
  Filled 2024-05-19 (×8): qty 1

## 2024-05-19 MED ORDER — BISACODYL 5 MG PO TBEC: 5.0000 mg | DELAYED_RELEASE_TABLET | Freq: Every day | ORAL | Status: DC | PRN

## 2024-05-19 NOTE — ED Notes (Signed)
 Per Adriana, MD, he advised to administer Metoprolol  12.5 mg.

## 2024-05-19 NOTE — Assessment & Plan Note (Deleted)
 Laura Gentry

## 2024-05-19 NOTE — Hospital Course (Addendum)
 Laura Gentry was admitted to the hospital with the working diagnosis of diarrhea.   69 yo female with the past medical history of T2DM, hyperlipidemia, coronary artery disease, and hypothyroidism who presented with diarrhea and dyspnea.  Reported persistent symptoms for the last 7 days, with no fever or vomiting. More than 10 episodes of diarrhea per day.  Recent hospitalization for sepsis due to urinary tract  infection, 12/18 to 05/14/24, with ESBL E Coli, treated with 3 days of meropenem . Hospitalization complicated with diarrhea and SVT. On her initial physical examination her blood pressure was 115/68, HR 86, RR 20 and 02 saturation 97%  Lungs with no wheezing or rhonchi, heart with S1 and S2 present and regular, with no gallops or rubs, abdomen soft and non tender, not distended, no lower extremity edema.   Na 134, K 3,4 Cl 95 bicarbonate 26 glucose 148, bun 24 cr 1,23 AST 18 ALT 22 BNP 2,053  High sensitive troponin 31  Lactic acid 1,8  Wbc 18.6 hgb 11.5 plt 360  Urine analysis SG 1,012, protein negative, trace leukocytes and negative hgb   Chest radiograph with hypoinflation, right rotation, positive cardiomegaly, bilateral hilar vascular congestion and small bilateral pleural effusions.   EKG 78 bpm, normal axis, normal intervals, sinus rhythm with left atrial enlargement, with no significant ST segment or T wave changes.   12/28 continue to have diarrhea  12/29 diarrhea has been persistent  12/30 diarrhea has resolved. Patient will follow up as outpatient

## 2024-05-19 NOTE — H&P (Signed)
 " History and Physical   Patient: Laura Gentry                            PCP: Rexanne Ingle, MD                    DOB: 1955/08/12            DOA: 05/18/2024 FMW:[id number]             DOS: 05/19/2024, 8:48 AM  Rexanne Ingle, MD  Patient coming from:   HOME  I have personally reviewed patient's medical records, in electronic medical records, including:  [facility], and care everywhere.    Chief Complaint:   Chief Complaint  Patient presents with   Diarrhea    History of present illness:    Laura Gentry is a 68 y.o. female with a history of DM II, HLD, CAD, brain aneurysm, osteoporosis, lumbosacral radiculopathy, hypothyroidism ... Presents with severe diarrhea and also short of breath.    ED Evaluation: Blood pressure 115/68, pulse 86, temperature 98.3 F (36.8 C), temperature source Oral, resp. rate 20, height 5' 6 (1.676 m), weight 79 kg, SpO2 97%.  LABs: WBC 18.6, hemoglobin 11.5, hematocrit 35.2, sodium 134, potassium 3.4, creatinine 1.23  BNP: 2,053.0  Trop: 31  UA: Trace leukocyte esterase, rare bacteria, WBC 0-5 Chest x-ray shows pulmonary edema.  ER physician actually gave 2 L fluid bolus.  Stool studies have been ordered.    Admitted for possible new onset CHF with severe diarrhea with recent admission for ESBL UTI sepsis.   Patient Denies having: Fever, Chills, Cough, SOB, Chest Pain, Abd pain, N/V/, headache, dizziness, lightheadedness,  Dysuria, Joint pain, rash, open wounds    Review of Systems: As per HPI, otherwise 10 point review of systems were negative.   ----------------------------------------------------------------------------------------------------------------------  Allergies[1]  Home MEDs:  Prior to Admission medications  Medication Sig Start Date End Date Taking? Authorizing Provider  acetaminophen  (TYLENOL ) 500 MG tablet Take 1 tablet (500 mg total) by mouth every 6 (six) hours as needed for mild pain or  headache. Patient taking differently: Take 1,000 mg by mouth every 6 (six) hours as needed for mild pain (pain score 1-3) or headache. 12/01/21   Sebastian Toribio GAILS, MD  alendronate  (FOSAMAX ) 70 MG tablet Take 70 mg by mouth every Monday. 06/16/23   [provider]  atorvastatin  (LIPITOR) 40 MG tablet Take 1 tablet (40 mg total) by mouth daily. Patient taking differently: Take 40 mg by mouth at bedtime. 01/31/18   Caleen Qualia, MD  Continuous Glucose Receiver (FREESTYLE LIBRE 3 READER) DEVI 1 Application by Does not apply route.    [provider]  gabapentin  (NEURONTIN ) 300 MG capsule Take 300 mg in AM, 300 mg at noon, 600 mg at bedtime Patient taking differently: Take 900 mg by mouth at bedtime. 09/21/16   Rivet, Connell PARAS, MD  insulin  aspart (NOVOLOG ) 100 UNIT/ML injection Inject 6-24 Units into the skin 3 (three) times daily before meals. Sliding scale:less than 100  Do not Take 100-140   = 6 units 141-180  =  10 units 181-220  =  12 units 221-260  =  14 units 261-300  =  16 units 301-340  =  18 units 341-380  =  20 units 381 > = 24 units    [provider]  insulin  glargine (LANTUS  SOLOSTAR) 100 UNIT/ML Solostar Pen Inject 32 Units into  the skin at bedtime. Patient taking differently: Inject 40-50 Units into the skin See admin instructions. Above 150 take 50 units Below 150 take 40 units at bedtime 11/24/21   Singh, Prashant K, MD  metoprolol  succinate (TOPROL -XL) 25 MG 24 hr tablet Take 0.5 tablets (12.5 mg total) by mouth daily. 05/15/24   Briana Elgin LABOR, MD  midodrine  (PROAMATINE ) 5 MG tablet Take 1 tablet (5 mg total) by mouth 3 (three) times daily with meals. Patient taking differently: Take 5 mg by mouth 2 (two) times daily with a meal. 12/01/21   Sebastian Toribio GAILS, MD  nitrofurantoin , macrocrystal-monohydrate, (MACROBID ) 100 MG capsule Take 100 mg by mouth 2 (two) times daily.    [provider]  pantoprazole  (PROTONIX ) 40 MG tablet Take 40 mg by  mouth daily.    [provider]  POTASSIUM GLUCONATE PO Take 1 tablet by mouth daily.    [provider]  ranolazine  (RANEXA ) 500 MG 12 hr tablet Take 1 tablet (500 mg total) by mouth 2 (two) times daily. 03/22/24   Jordan, Peter M, MD  Semaglutide,0.25 or 0.5MG /DOS, (OZEMPIC, 0.25 OR 0.5 MG/DOSE,) 2 MG/3ML SOPN Inject 0.5 mg into the skin every Thursday. 10/15/22   [provider]  sertraline  (ZOLOFT ) 25 MG tablet Take 25 mg by mouth at bedtime. 07/14/23   [provider]  thiamine  (VITAMIN B-1) 100 MG tablet Take 100 mg by mouth daily.    [provider]  vitamin B-12 (CYANOCOBALAMIN ) 1000 MCG tablet Take 1,000 mcg by mouth daily.    [provider]  Vitamin D, Ergocalciferol, (DRISDOL) 1.25 MG (50000 UNIT) CAPS capsule Take 50,000 Units by mouth every 7 (seven) days.    [provider]    PRN MEDs: acetaminophen  **OR** acetaminophen , bisacodyl , hydrALAZINE , HYDROmorphone  (DILAUDID ) injection, ipratropium, ondansetron  **OR** ondansetron  (ZOFRAN ) IV, oxyCODONE , senna-docusate, sodium phosphate , traZODone   Past Medical History:  Diagnosis Date   Brain aneurysm 03/19/2013   Cataracts, bilateral    Cerebral aneurysm    Colon polyps    Diabetes (HCC) 2013   High cholesterol    History of kidney stones    Insomnia    Lumbosacral radiculopathy    Osteoporosis    Skin cancer    squamous   Thyroid  disease    Tremor     Past Surgical History:  Procedure Laterality Date   APPENDECTOMY     BRAIN SURGERY  03/19/2013   Sugita aneyrsum clip can have MRI up to 4T docs scanned in    BREAST BIOPSY Right    CHOLECYSTECTOMY  2013   CYSTOSCOPY WITH RETROGRADE PYELOGRAM, URETEROSCOPY AND STENT PLACEMENT Bilateral 10/29/2021   Procedure: CYSTOSCOPY WITH RETROGRADE PYELOGRAM, URETEROSCOPY AND STENT PLACEMENT;  Surgeon: Alvaro Hummer, MD;  Location: [facility];  Service: Urology;  Laterality: Bilateral;   CYSTOSCOPY WITH RETROGRADE PYELOGRAM,  URETEROSCOPY AND STENT PLACEMENT Bilateral 11/19/2021   Procedure: CYSTOSCOPY WITH RETROGRADE PYELOGRAM, URETEROSCOPY AND STENT EXCHANGE, BASKET STONE REMOVAL;  Surgeon: Alvaro Hummer, MD;  Location: [facility];  Service: Urology;  Laterality: Bilateral;  75 MINS   HIP PINNING,CANNULATED Left 11/04/2021   Procedure: CANNULATED HIP PINNING;  Surgeon: Genelle Standing, MD;  Location: [facility];  Service: Orthopedics;  Laterality: Left;   HOLMIUM LASER APPLICATION Bilateral 11/19/2021   Procedure: HOLMIUM LASER APPLICATION;  Surgeon: Alvaro Hummer, MD;  Location: [facility];  Service: Urology;  Laterality: Bilateral;   INNER EAR SURGERY     Lost Hearing   LEFT HEART CATH AND CORONARY ANGIOGRAPHY N/A 11/09/2023  Procedure: LEFT HEART CATH AND CORONARY ANGIOGRAPHY;  Surgeon: Jordan, Peter M, MD;  Location: [facility] INVASIVE CV LAB;  Service: Cardiovascular;  Laterality: N/A;   TONSILLECTOMY AND ADENOIDECTOMY     VAGINAL HYSTERECTOMY     At age 37 due to growth     reports that she quit smoking about 12 years ago. Her smoking use included cigarettes. She started smoking about 53 years ago. She has a 20 pack-year smoking history. She has never used smokeless tobacco. She reports that she does not drink alcohol and does not use drugs.   Family History  Problem Relation Age of Onset   Asthma Mother    Allergies Mother    Diabetes Mother    Lung cancer Father        smoked   Throat cancer Father        smoked   Pancreatic cancer Brother    Hypertension Maternal Grandfather    Diabetes Maternal Grandfather    Emphysema Maternal Aunt        never smoked, spouse did   Breast cancer Maternal Aunt    Breast cancer Maternal Aunt    Breast cancer Paternal Aunt     Physical Exam:   Vitals:   05/19/24 0725 05/19/24 0745 05/19/24 0800 05/19/24 0815  BP:  111/61 (!) 116/58 115/61  Pulse:  74 76 77  Resp:  (!) 24 19 18   Temp:      TempSrc:      SpO2: 100% 100% 100% 98%  Weight:      Height:        Constitutional: NAD, calm, comfortable Eyes: PERRL, lids and conjunctivae normal ENMT: Mucous membranes are moist. Posterior pharynx clear of any exudate or lesions.Normal dentition.  Neck: normal, supple, no masses, no thyromegaly Respiratory: clear to auscultation bilaterally, no wheezing, no crackles. Normal respiratory effort. No accessory muscle use.  Cardiovascular: Regular rate and rhythm, no murmurs / rubs / gallops. No extremity edema. 2+ pedal pulses. No carotid bruits.  Abdomen: no tenderness, no masses palpated. No hepatosplenomegaly. Bowel sounds positive.  Musculoskeletal: no clubbing / cyanosis. No joint deformity upper and lower extremities. Good ROM, no contractures. Normal muscle tone.  Neurologic: CN II-XII grossly intact. Sensation intact, DTR normal. Strength 5/5 in all 4.  Psychiatric: Normal judgment and insight. Alert and oriented x 3. Normal mood.  Skin: no rashes, lesions, ulcers. No induration          Labs on admission:    I have personally reviewed following labs and imaging studies  CBC: Recent Labs  Lab 05/13/24 0342 05/18/24 2233  WBC 5.5 18.6*  NEUTROABS 3.6  --   HGB 11.3* 11.5*  HCT 34.3* 35.2*  MCV 78.9* 78.9*  PLT 225 360   Basic Metabolic Panel: Recent Labs  Lab 05/13/24 0342 05/14/24 0441 05/18/24 2233 05/19/24 0244  NA 135 140 134*  --   K 3.5 3.9 3.4*  --   CL 105 105 95*  --   CO2 19* 29 26  --   GLUCOSE 122* 134* 148*  --   BUN 12 10 24*  --   CREATININE 1.04* 0.86 1.23*  --   CALCIUM  8.2* 8.4* 8.4*  --   MG 1.5* 1.9  --  1.4*  PHOS 1.8* 2.1*  --  3.1   GFR: Estimated Creatinine Clearance: 45.8 mL/min (A) (by C-G formula based on SCr of 1.23 mg/dL (H)). Liver Function Tests: Recent Labs  Lab 05/18/24 2233  AST 18  ALT 22  ALKPHOS 104  BILITOT 1.2  PROT 6.5  ALBUMIN 3.3*   Recent Labs  Lab 05/18/24 2233  LIPASE 29   No results for input(s): AMMONIA in the last 168 hours. Coagulation  Profile: Recent Labs  Lab 05/19/24 0244  INR 1.1   Cardiac Enzymes: No results for input(s): CKTOTAL, CKMB, CKMBINDEX, TROPONINI in the last 168 hours. BNP (last 3 results) Recent Labs    05/19/24 0244  PROBNP 2,053.0*   HbA1C: No results for input(s): HGBA1C in the last 72 hours. CBG: Recent Labs  Lab 05/13/24 2103 05/14/24 0610 05/14/24 1122 05/14/24 1632 05/19/24 0844  GLUCAP 146* 143* 165* 149* 161*    Urine analysis:    Component Value Date/Time   COLORURINE YELLOW 05/19/2024 0136   APPEARANCEUR HAZY (A) 05/19/2024 0136   LABSPEC 1.012 05/19/2024 0136   PHURINE 5.0 05/19/2024 0136   GLUCOSEU NEGATIVE 05/19/2024 0136   HGBUR NEGATIVE 05/19/2024 0136   BILIRUBINUR NEGATIVE 05/19/2024 0136   BILIRUBINUR small 12/31/2016 1710   KETONESUR 5 (A) 05/19/2024 0136   PROTEINUR NEGATIVE 05/19/2024 0136   UROBILINOGEN 0.2 12/31/2016 1710   UROBILINOGEN 0.2 03/07/2015 1354   NITRITE NEGATIVE 05/19/2024 0136   LEUKOCYTESUR TRACE (A) 05/19/2024 0136    Last A1C:  Lab Results  Component Value Date   HGBA1C 7.0 (H) 05/12/2024     Radiologic Exams on Admission:   DG Chest Portable 1 View Result Date: 05/19/2024 EXAM: 1 VIEW(S) XRAY OF THE CHEST 05/19/2024 02:50:00 AM COMPARISON: 05/13/2024 CLINICAL HISTORY: sob FINDINGS: LUNGS AND PLEURA: Bibasilar airspace opacities. Chronic coarsened interstitial markings without pulmonary edema. Bilateral costophrenic angle blunting. No pneumothorax. HEART AND MEDIASTINUM: Cardiomegaly. Atherosclerotic plaque. BONES AND SOFT TISSUES: No acute osseous abnormality. IMPRESSION: 1. CHF with pulmonary edema and small bilateral pleural effusions, progressed from 12 / 20 / 25. Electronically signed by: Norman Gatlin MD 05/19/2024 02:56 AM EST RP Workstation: [id number]    EKG:   Independently reviewed.  Orders placed or performed during the hospital encounter of 05/18/24   ED EKG   ED EKG   EKG 12-Lead    ---------------------------------------------------------------------------------------------------------------------------------------    Assessment / Plan:   Principal Problem:   Diarrhea Active Problems:   Leukocytosis   Hypotension   Elevated brain natriuretic peptide (BNP) level   HLD (hyperlipidemia)   AKI (acute kidney injury)   Acquired hypothyroidism   DM II (diabetes mellitus, type II), controlled (HCC)   History of depression   Hypomagnesemia   Depression   Iron deficiency anemia   Type 2 diabetes mellitus with peripheral neuropathy (HCC)   Hypokalemia   Coronary artery disease   Assessment and Plan: * Diarrhea - Recent admission and discharge -was treated treated aggressively for UTI sepsis -patient was discharged on 05/14/24 -had 3 days of meropenem  was discharged on nitrofurantoin  -UA clear now Will hold nitrofurantoin , will hold Protonix  -Stool pending, ruling out C. difficile colitis -Invasive leukocytosis, diarrhea, initiating empiric antibiotics of Flagyl  With confirmatory result anticipating to switch to p.o. vancomycin   Elevated brain natriuretic peptide (BNP) level HFpEF Elevated BNP 2,053.0 >>>  - Last Echocardiogram was obtained April 2025 EF 55-60%, normal LV function, possible basal anterolateral hypokinesis, Grade 1 Diastolic dysfunction, normal RV function, valves within normal limits mildly calcified aortic valve  -Monitoring I's and O's, daily weight, monitoring labs including BNP, kidney function  -2 L IV fluid was given in ED,  - Will be careful in diuresing at this point -as patient looks dry on exam, and  having diarrhea  Hypotension History of dystonia with chronic hypotension Blood pressure stable, continue midodrine   Leukocytosis Ruling out superimposed infection such as C. difficile colitis, Recently treated aggressively with IV antibiotics and p.o. for UTI sepsis on last admission-   DM II (diabetes mellitus, type II),  controlled (HCC) Last A1c 7.0 -Blood sugars well-controlled -Resuming long-acting insulin   - Holding home medication of Ozempic for now due to possible infection -Continue statins -Checking CBG q. ACHS, SSI coverage  Acquired hypothyroidism Listed in medical record patient is not on any thyroid  medication including Synthroid -Checking TSH  AKI (acute kidney injury) AKI-mild likely due to diarrhea-mild dehydration although BNP elevated with mild edema possible third spacing Lab Results  Component Value Date   CREATININE 1.23 (H) 05/18/2024   CREATININE 0.86 05/14/2024   CREATININE 1.04 (H) 05/13/2024  Avoiding nephrotoxins, - 2 L of IV fluid given in ED - Monitoring kidney function closely   HLD (hyperlipidemia) Continue statins  Depression - Mood stable, continue home medication of sertraline   Hypomagnesemia Monitoring and replating  History of depression Currently hypotensive, on midodrine   Iron deficiency anemia Monitoring H&H closely -Continue iron supplements  Coronary artery disease Denies any chest pain, mild shortness of breath was present on previous admission also -Last cardiac cath 2025 June.  Showing single-vessel obstructive disease, first diagonal, 2 small for PCI. Continue home medication of Ranexa , statins,  Hypokalemia Monitoring and repeating accordingly  Type 2 diabetes mellitus with peripheral neuropathy (HCC) Continue Neurontin --it appears the patient is high dose nightly 900 mg Will reduce dose-for possibly contributing to fluid retention   Debility -with generalized weakness -Fall precautions - Consult PT OT for evaluation recommendations   Consults called:  None -------------------------------------------------------------------------------------------------------------------------------------------- DVT prophylaxis:  heparin  injection 5,000 Units Start: 05/19/24 1400 SCDs Start: 05/19/24 0726   Code Status:   Code Status: Full  Code   Admission status: Patient will be admitted as Observation, with a greater than 2 midnight length of stay. Level of care: Telemetry   Family Communication:  none at bedside  (The above findings and plan of care has been discussed with patient in detail, the patient expressed understanding and agreement of above plan)  --------------------------------------------------------------------------------------------------------------------------------------------------  Disposition Plan:  Anticipated 1-2 days Status is: Observation The patient remains OBS appropriate and will d/c before 2 midnights.     ----------------------------------------------------------------------------------------------------------------------------------------------------  Time spent:  37  Min.  Was spent seeing and evaluating the patient, reviewing all medical records, drawn plan of care.  SIGNED: Adriana DELENA Grams, MD, FHM. FAAFP. [place] [facility], Pager  (Please use [email or web address] to page/ or secure chat through epic) If 7PM-7AM, please contact night-coverage [email or web address],  05/19/2024, 8:48 AM      [1]  Allergies Allergen Reactions   Amoxicillin Anaphylaxis and Shortness Of Breath   Bee Venom Shortness Of Breath   Penicillins Anaphylaxis, Swelling and Other (See Comments)    Remote reaction- Throat swelling - compromised airway Tolerates ceftriaxone    Pneumococcal Vaccines Other (See Comments)    Per patient due to Brain Aneurysm   Sulfa  Antibiotics Hives, Itching and Swelling   Bactrim  [Sulfamethoxazole -Trimethoprim ] Hives, Itching, Swelling and Other (See Comments)    Felt off balance   Caffeine Other (See Comments)    headache   "

## 2024-05-19 NOTE — Assessment & Plan Note (Deleted)
 Monitoring and replating

## 2024-05-19 NOTE — ED Notes (Signed)
 BP Seyed, MD messaged.

## 2024-05-19 NOTE — TOC CM/SW Note (Signed)
 Transition of Care [facility]) - Inpatient Brief Assessment   Patient Details  Name: Laura Gentry MRN: [id number] Date of Birth: 1955/11/18  Transition of Care [facility]) CM/SW Contact:    [name] Saa, LCSWA Phone Number: 05/19/2024, 1:41 PM   Clinical Narrative:  1:41 PM Per chart review, patient resides at home with her sister. Patient has a PCP and insurance. Patient has SNF history with [facility]. Patient has HH history with [facility]. Patient has DME (RW, BSC, shower chair) history with [facility]. Patient's preferred pharmacy's are [facility] [phone] [place] and [facility]. No TOC needs identified at this time. TOC will continue to follow.  Transition of Care Asessment: Insurance and Status: Insurance coverage has been reviewed Patient has primary care physician: Yes Home environment has been reviewed: Private Residence Prior level of function:: N/A Prior/Current Home Services: No current home services Social Drivers of Health Review: SDOH reviewed no interventions necessary Readmission risk has been reviewed: Yes (Currently Observation Status) Transition of care needs: no transition of care needs at this time

## 2024-05-19 NOTE — ED Notes (Signed)
 Pt brief changed.

## 2024-05-19 NOTE — Assessment & Plan Note (Deleted)
 Continue Neurontin --it appears the patient is high dose nightly 900 mg Will reduce dose-for possibly contributing to fluid retention

## 2024-05-19 NOTE — Care Management Obs Status (Signed)
 MEDICARE OBSERVATION STATUS NOTIFICATION   Patient Details  Name: Laura Gentry MRN: [id number] Date of Birth: [birth year]   Medicare Observation Status Notification Given:  Yes   Verbally reviewed observation notice with Laura Gentry telephonically at [phone].  Will deliver a copy to the patients room.   Zyheir Daft 05/19/2024, 12:55 PM

## 2024-05-19 NOTE — Assessment & Plan Note (Deleted)
 Ruling out superimposed infection such as C. difficile colitis, Recently treated aggressively with IV antibiotics and p.o. for UTI sepsis on last admission-

## 2024-05-19 NOTE — Assessment & Plan Note (Addendum)
 Continue oral iron supplementation  Discharge hgb is 11.7  Follow up as outpatient

## 2024-05-19 NOTE — Assessment & Plan Note (Addendum)
 Uncontrolled T2DM with hyperglycemia, with Hgb A1c 7.0 Plan to continue insulin  sliding scale for glucose cover and monitoring Continue with statin

## 2024-05-19 NOTE — Plan of Care (Signed)
  Problem: Education: Goal: Ability to describe self-care measures that may prevent or decrease complications (Diabetes Survival Skills Education) will improve Outcome: Progressing   Problem: Fluid Volume: Goal: Ability to maintain a balanced intake and output will improve Outcome: Progressing   Problem: Health Behavior/Discharge Planning: Goal: Ability to identify and utilize available resources and services will improve Outcome: Progressing

## 2024-05-19 NOTE — Assessment & Plan Note (Deleted)
 Last A1c 7.0 -Blood sugars well-controlled -Resuming long-acting insulin  with reduced dose as oral intake improves will titrate up -Checking CBG q. ACHS, SSI coverage

## 2024-05-19 NOTE — Assessment & Plan Note (Deleted)
 Continue statins

## 2024-05-19 NOTE — Assessment & Plan Note (Addendum)
 Last cardiac cath 2025 June.  Showing single-vessel obstructive disease, first diagonal, 2 small for PCI. Continue home medication of Ranexa , statins,  High sensitive troponin elevation not consistent with acute coronary syndrome.

## 2024-05-19 NOTE — Assessment & Plan Note (Deleted)
 AKI-mild likely due to diarrhea-mild dehydration although BNP elevated with mild edema possible third spacing Lab Results  Component Value Date   CREATININE 1.23 (H) 05/18/2024   CREATININE 0.86 05/14/2024   CREATININE 1.04 (H) 05/13/2024  Avoiding nephrotoxins, - 2 L of IV fluid given in ED - Monitoring kidney function closely

## 2024-05-19 NOTE — ED Notes (Addendum)
 BP 110/58, HR 75, Seyed, MD message on whether to hold the pt's Metoprolol . Holding Metoprolol  until MD advise otherwise.

## 2024-05-19 NOTE — Assessment & Plan Note (Addendum)
 Not on levothyroxine, her TSH on 12/19 was 3.9 Plan to follow up as outpatient

## 2024-05-19 NOTE — ED Provider Notes (Addendum)
 " [place] [facility]ERGENCY DEPARTMENT AT [facility] Provider Note   CSN: [id number] Arrival date & time: 05/18/24  2148     Patient presents with: Diarrhea  Laura Gentry is a 68 y.o. female with hx of DMII, hyperlipidemia, osteoporosis, CAD. Presented with sepsis with AKI, GI panel and C. Diff negative, Urine culture ESBL + treated with meropenem . Had intermittent SVT during admission as well.  She returns this evening with persistent diarrhea, weakness, nausea. The patient endorses >10 episodes of diarrhea per day this week.     HPI     Prior to Admission medications  Medication Sig Start Date End Date Taking? Authorizing Provider  acetaminophen  (TYLENOL ) 500 MG tablet Take 1 tablet (500 mg total) by mouth every 6 (six) hours as needed for mild pain or headache. Patient taking differently: Take 1,000 mg by mouth every 6 (six) hours as needed for mild pain (pain score 1-3) or headache. 12/01/21   Sebastian Toribio GAILS, MD  alendronate  (FOSAMAX ) 70 MG tablet Take 70 mg by mouth every Monday. 06/16/23   [provider]  atorvastatin  (LIPITOR) 40 MG tablet Take 1 tablet (40 mg total) by mouth daily. Patient taking differently: Take 40 mg by mouth at bedtime. 01/31/18   Caleen Qualia, MD  Continuous Glucose Receiver (FREESTYLE LIBRE 3 READER) DEVI 1 Application by Does not apply route.    [provider]  gabapentin  (NEURONTIN ) 300 MG capsule Take 300 mg in AM, 300 mg at noon, 600 mg at bedtime Patient taking differently: Take 900 mg by mouth at bedtime. 09/21/16   Rivet, Connell PARAS, MD  insulin  aspart (NOVOLOG ) 100 UNIT/ML injection Inject 6-24 Units into the skin 3 (three) times daily before meals. Sliding scale:less than 100  Do not Take 100-140   = 6 units 141-180  =  10 units 181-220  =  12 units 221-260  =  14 units 261-300  =  16 units 301-340  =  18 units 341-380  =  20 units 381 > = 24 units    [provider]  insulin  glargine (LANTUS  SOLOSTAR)  100 UNIT/ML Solostar Pen Inject 32 Units into the skin at bedtime. Patient taking differently: Inject 40-50 Units into the skin See admin instructions. Above 150 take 50 units Below 150 take 40 units at bedtime 11/24/21   Singh, Prashant K, MD  metoprolol  succinate (TOPROL -XL) 25 MG 24 hr tablet Take 0.5 tablets (12.5 mg total) by mouth daily. 05/15/24   Briana Elgin LABOR, MD  midodrine  (PROAMATINE ) 5 MG tablet Take 1 tablet (5 mg total) by mouth 3 (three) times daily with meals. Patient taking differently: Take 5 mg by mouth 2 (two) times daily with a meal. 12/01/21   Sebastian Toribio GAILS, MD  nitrofurantoin , macrocrystal-monohydrate, (MACROBID ) 100 MG capsule Take 100 mg by mouth 2 (two) times daily.    [provider]  pantoprazole  (PROTONIX ) 40 MG tablet Take 40 mg by mouth daily.    [provider]  POTASSIUM GLUCONATE PO Take 1 tablet by mouth daily.    [provider]  ranolazine  (RANEXA ) 500 MG 12 hr tablet Take 1 tablet (500 mg total) by mouth 2 (two) times daily. 03/22/24   Jordan, Peter M, MD  Semaglutide,0.25 or 0.5MG /DOS, (OZEMPIC, 0.25 OR 0.5 MG/DOSE,) 2 MG/3ML SOPN Inject 0.5 mg into the skin every Thursday. 10/15/22   [provider]  sertraline  (ZOLOFT ) 25 MG tablet Take 25 mg by mouth at bedtime. 07/14/23   [provider]  thiamine  (VITAMIN B-1) 100 MG tablet Take 100 mg by mouth daily.    [provider]  vitamin B-12 (CYANOCOBALAMIN ) 1000 MCG tablet Take 1,000 mcg by mouth daily.    [provider]  Vitamin D, Ergocalciferol, (DRISDOL) 1.25 MG (50000 UNIT) CAPS capsule Take 50,000 Units by mouth every 7 (seven) days.    [provider]    Allergies: Amoxicillin, Bee venom, Penicillins, Pneumococcal vaccines, Sulfa  antibiotics, Bactrim  [sulfamethoxazole -trimethoprim ], and Caffeine    Review of Systems  Constitutional:  Positive for appetite change and fatigue.  HENT: Negative.    Respiratory:  Positive for  shortness of breath.   Gastrointestinal:  Positive for diarrhea. Negative for abdominal pain and vomiting.  Genitourinary: Negative.   Neurological: Negative.     Updated Vital Signs BP 115/68   Pulse 86   Temp 97.7 F (36.5 C) (Axillary)   Resp 20   Ht 5' 6 (1.676 m)   Wt 79 kg   SpO2 97%   BMI 28.11 kg/m   Physical Exam Vitals and nursing note reviewed.  Constitutional:      Appearance: She is ill-appearing. She is not toxic-appearing.  HENT:     Head: Normocephalic and atraumatic.     Mouth/Throat:     Mouth: Mucous membranes are moist.     Pharynx: No oropharyngeal exudate or posterior oropharyngeal erythema.  Eyes:     General:        Right eye: No discharge.        Left eye: No discharge.     Conjunctiva/sclera: Conjunctivae normal.  Cardiovascular:     Rate and Rhythm: Normal rate and regular rhythm.     Pulses: Normal pulses.     Heart sounds: Normal heart sounds.  Pulmonary:     Effort: Tachypnea and accessory muscle usage present. No respiratory distress.     Breath sounds: Examination of the right-middle field reveals rales. Examination of the right-lower field reveals rales. Examination of the left-lower field reveals rales. Rales present. No wheezing.  Abdominal:     General: Bowel sounds are normal. There is no distension.     Palpations: Abdomen is soft.     Tenderness: There is no abdominal tenderness.  Musculoskeletal:        General: No deformity.     Cervical back: Neck supple.     Right lower leg: No edema.     Left lower leg: No edema.  Skin:    General: Skin is warm and dry.     Capillary Refill: Capillary refill takes less than 2 seconds.  Neurological:     General: No focal deficit present.     Mental Status: She is alert and oriented to person, place, and time. Mental status is at baseline.  Psychiatric:        Mood and Affect: Mood normal.     (all labs ordered are listed, but only abnormal results are displayed) Labs Reviewed   COMPREHENSIVE METABOLIC PANEL WITH GFR - Abnormal; Notable for the following components:      Result Value   Sodium 134 (*)    Potassium 3.4 (*)    Chloride 95 (*)    Glucose, Bld 148 (*)    BUN 24 (*)    Creatinine, Ser 1.23 (*)    Calcium  8.4 (*)    Albumin 3.3 (*)    GFR, Estimated 47 (*)    All other components within normal limits  CBC - Abnormal; Notable for the  following components:   WBC 18.6 (*)    Hemoglobin 11.5 (*)    HCT 35.2 (*)    MCV 78.9 (*)    MCH 25.8 (*)    All other components within normal limits  URINALYSIS, ROUTINE W REFLEX MICROSCOPIC - Abnormal; Notable for the following components:   APPearance HAZY (*)    Ketones, ur 5 (*)    Leukocytes,Ua TRACE (*)    Bacteria, UA RARE (*)    All other components within normal limits  PRO BRAIN NATRIURETIC PEPTIDE - Abnormal; Notable for the following components:   Pro Brain Natriuretic Peptide 2,053.0 (*)    All other components within normal limits  TROPONIN T, HIGH SENSITIVITY - Abnormal; Notable for the following components:   Troponin T High Sensitivity 31 (*)    All other components within normal limits  CULTURE, BLOOD (ROUTINE X 2)  CULTURE, BLOOD (ROUTINE X 2)  GASTROINTESTINAL PANEL BY PCR, STOOL (REPLACES STOOL CULTURE)  URINE CULTURE  LIPASE, BLOOD  PROTIME-INR  I-STAT CG4 LACTIC ACID, ED  I-STAT CG4 LACTIC ACID, ED    EKG: None  Radiology: DG Chest Portable 1 View Result Date: 05/19/2024 EXAM: 1 VIEW(S) XRAY OF THE CHEST 05/19/2024 02:50:00 AM COMPARISON: 05/13/2024 CLINICAL HISTORY: sob FINDINGS: LUNGS AND PLEURA: Bibasilar airspace opacities. Chronic coarsened interstitial markings without pulmonary edema. Bilateral costophrenic angle blunting. No pneumothorax. HEART AND MEDIASTINUM: Cardiomegaly. Atherosclerotic plaque. BONES AND SOFT TISSUES: No acute osseous abnormality. IMPRESSION: 1. CHF with pulmonary edema and small bilateral pleural effusions, progressed from 12 / 20 / 25.  Electronically signed by: Norman Gatlin MD 05/19/2024 02:56 AM EST RP Workstation: [id number]     Procedures   Medications Ordered in the ED  ondansetron  (ZOFRAN -ODT) disintegrating tablet 4 mg (4 mg Oral Given 05/18/24 2213)  lactated ringers  bolus 1,000 mL (0 mLs Intravenous Stopped 05/19/24 0310)  ondansetron  (ZOFRAN ) injection 4 mg (4 mg Intravenous Given 05/19/24 0150)  lactated ringers  bolus 1,000 mL (0 mLs Intravenous Stopped 05/19/24 0450)    Clinical Course as of 05/19/24 0627  Fri May 19, 2024  0616 Consult Dr. Franky, hospitalist who is amenable to admitting this patient to his service.  Appreciate his collaboration in the care of the patient. [RS]    Clinical Course User Index [RS] Lataysha Vohra, Pleasant SAUNDERS, PA-C                                 Medical Decision Making 68 y/o female who presents with SOB and diarrhea.   Tachypneic on arrival, cardiopulmonary exam as above with rales. Abdominal exam is benign.   Amount and/or Complexity of Data Reviewed Labs: ordered.    Details: CBC with leukocytosis of 18.6, CMP with mild AKI with creatinine of 1.2 increased from patient's baseline of 0.8.  Lactic acid is normal, UA not convincing for infection at this time but BNP greater than 2000 and troponin is 31.  Radiology: ordered.    Details:  Chest x-ray with progressive pulmonary edema and small bilateral pleural effusions.   Risk Prescription drug management. Decision regarding hospitalization.    Patient requiring 2 L of naloxone  by nasal cannula to maintain sats greater than 90%.  Clinical picture was consistent with shortness of breath secondary to acute heart failure exacerbation with known diastolic dysfunction.  Also persistent diarrhea complicating course with dehydration.  Consult to hospitalist as above for admission for further stabilization.  Particia voiced understanding of her  medical evaluation and treatment plan. Each of their questions answered to their  expressed satisfaction.    This chart was dictated using voice recognition software, Dragon. Despite the best efforts of this provider to proofread and correct errors, errors may still occur which can change documentation meaning.      Final diagnoses:  Acute on chronic congestive heart failure, unspecified heart failure type [facility])    ED Discharge Orders     None          Bobette Pleasant SAUNDERS, PA-C 05/19/24 0627    Adylene Dlugosz, Pleasant SAUNDERS, PA-C 05/19/24 [id number]    Bari Charmaine FALCON, MD 05/19/24 [phone]  "

## 2024-05-19 NOTE — Assessment & Plan Note (Deleted)
 HFpEF Elevated BNP 2,053.0 >>>  - Last Echocardiogram was obtained April 2025 EF 55-60%, normal LV function, possible basal anterolateral hypokinesis, Grade 1 Diastolic dysfunction, normal RV function, valves within normal limits mildly calcified aortic valve  -Monitoring I's and O's, daily weight, monitoring labs including BNP, kidney function  -2 L IV fluid was given in ED,  - Will be careful in diuresing at this point -as patient looks dry on exam, and having diarrhea

## 2024-05-19 NOTE — Assessment & Plan Note (Addendum)
 History of dystonia with chronic hypotension Continue midodrine 

## 2024-05-19 NOTE — Assessment & Plan Note (Deleted)
-   Mood stable, continue home medication of sertraline

## 2024-05-19 NOTE — ED Notes (Signed)
 Pt placed on bed pan to attempt stool sample, unsuccessful. Pt advised to inform me when she feels the urge and we will attempt again.

## 2024-05-19 NOTE — Assessment & Plan Note (Deleted)
-   Recent admission and discharge -was treated treated aggressively for UTI sepsis -patient was discharged on 05/14/24 -had 3 days of meropenem  was discharged on nitrofurantoin  -UA clear now Will hold nitrofurantoin , will hold Protonix  -Stool pending, ruling out C. difficile colitis -Invasive leukocytosis, diarrhea, initiating empiric antibiotics of Flagyl  With confirmatory result anticipating to switch to p.o. vancomycin

## 2024-05-19 NOTE — Assessment & Plan Note (Addendum)
 Continue with sertraline and trazodone.

## 2024-05-20 DIAGNOSIS — A09 Infectious gastroenteritis and colitis, unspecified: Secondary | ICD-10-CM | POA: Diagnosis not present

## 2024-05-20 DIAGNOSIS — Z8659 Personal history of other mental and behavioral disorders: Secondary | ICD-10-CM

## 2024-05-20 DIAGNOSIS — D509 Iron deficiency anemia, unspecified: Secondary | ICD-10-CM

## 2024-05-20 DIAGNOSIS — R197 Diarrhea, unspecified: Secondary | ICD-10-CM | POA: Diagnosis not present

## 2024-05-20 DIAGNOSIS — N179 Acute kidney failure, unspecified: Secondary | ICD-10-CM

## 2024-05-20 DIAGNOSIS — E785 Hyperlipidemia, unspecified: Secondary | ICD-10-CM | POA: Diagnosis not present

## 2024-05-20 DIAGNOSIS — E1169 Type 2 diabetes mellitus with other specified complication: Secondary | ICD-10-CM | POA: Diagnosis not present

## 2024-05-20 DIAGNOSIS — I2583 Coronary atherosclerosis due to lipid rich plaque: Secondary | ICD-10-CM

## 2024-05-20 DIAGNOSIS — I471 Supraventricular tachycardia, unspecified: Secondary | ICD-10-CM

## 2024-05-20 DIAGNOSIS — I2510 Atherosclerotic heart disease of native coronary artery without angina pectoris: Secondary | ICD-10-CM | POA: Diagnosis not present

## 2024-05-20 DIAGNOSIS — I959 Hypotension, unspecified: Secondary | ICD-10-CM | POA: Diagnosis not present

## 2024-05-20 DIAGNOSIS — E039 Hypothyroidism, unspecified: Secondary | ICD-10-CM | POA: Diagnosis not present

## 2024-05-20 LAB — GASTROINTESTINAL PANEL BY PCR, STOOL (REPLACES STOOL CULTURE)

## 2024-05-20 LAB — CBC
HCT: 32.2 % — ABNORMAL LOW (ref 36.0–46.0)
Hemoglobin: 10.5 g/dL — ABNORMAL LOW (ref 12.0–15.0)
MCH: 26 pg (ref 26.0–34.0)
MCHC: 32.6 g/dL (ref 30.0–36.0)
MCV: 79.7 fL — ABNORMAL LOW (ref 80.0–100.0)
Platelets: 338 K/uL (ref 150–400)
RBC: 4.04 MIL/uL (ref 3.87–5.11)
RDW: 13.9 % (ref 11.5–15.5)
WBC: 8.4 K/uL (ref 4.0–10.5)
nRBC: 0 % (ref 0.0–0.2)

## 2024-05-20 LAB — GLUCOSE, CAPILLARY
Glucose-Capillary: 189 mg/dL — ABNORMAL HIGH (ref 70–99)
Glucose-Capillary: 248 mg/dL — ABNORMAL HIGH (ref 70–99)
Glucose-Capillary: 231 mg/dL — ABNORMAL HIGH (ref 70–99)
Glucose-Capillary: 174 mg/dL — ABNORMAL HIGH (ref 70–99)

## 2024-05-20 LAB — C DIFFICILE QUICK SCREEN W PCR REFLEX
C Diff antigen: NEGATIVE
C Diff interpretation: NOT DETECTED
C Diff toxin: NEGATIVE

## 2024-05-20 LAB — URINE CULTURE: Culture: NO GROWTH

## 2024-05-20 LAB — BASIC METABOLIC PANEL WITH GFR
Anion gap: 7 (ref 5–15)
BUN: 19 mg/dL (ref 8–23)
CO2: 28 mmol/L (ref 22–32)
Calcium: 8.2 mg/dL — ABNORMAL LOW (ref 8.9–10.3)
Chloride: 104 mmol/L (ref 98–111)
Creatinine, Ser: 0.8 mg/dL (ref 0.44–1.00)
GFR, Estimated: >60 mL/min
Glucose, Bld: 200 mg/dL — ABNORMAL HIGH (ref 70–99)
Potassium: 3.3 mmol/L — ABNORMAL LOW (ref 3.5–5.1)
Sodium: 139 mmol/L (ref 135–145)

## 2024-05-20 LAB — PRO BRAIN NATRIURETIC PEPTIDE: Pro Brain Natriuretic Peptide: 1162 pg/mL — ABNORMAL HIGH

## 2024-05-20 MED ORDER — ENOXAPARIN SODIUM 40 MG/0.4ML IJ SOSY
40.0000 mg | PREFILLED_SYRINGE | INTRAMUSCULAR | Status: DC
  Administered 2024-05-20 – 2024-05-21 (×2): 40 mg via SUBCUTANEOUS
  Filled 2024-05-20 (×2): qty 0.4

## 2024-05-20 MED ORDER — POTASSIUM CHLORIDE CRYS ER 20 MEQ PO TBCR
40.0000 meq | EXTENDED_RELEASE_TABLET | ORAL | Status: AC
  Administered 2024-05-20 (×2): 40 meq via ORAL
  Filled 2024-05-20 (×2): qty 2

## 2024-05-20 NOTE — Assessment & Plan Note (Signed)
 Patient with recent exposure to broad spectrum antibiotic therapy with meropenem  for 3 days Gastrointestinal infectious panel and C diff negative.  Leukocytosis resolved.   Initially patient was placed on oral vancomycin  for possible C diff, once tested negative this was discontinued.  She received symptomatic therapy with good toleration.  By 12/30 her diarrhea has resolved and patient having formed stools.

## 2024-05-20 NOTE — Assessment & Plan Note (Signed)
 Continue with metoprolol  succinate 12.5 mg daily  Recent echocardiogram 04.2025 with preserved LV systolic function with EF 50 to 60%, possible basal antero lateral hypokinesis. Grade I diastolic dysfunction with impaired relaxation, RV systolic function preserved, LA with mild dilatation, no significant valvular disease.   For now will ruled out congested heart failure

## 2024-05-20 NOTE — Evaluation (Signed)
 Occupational Therapy Evaluation Patient Details Name: Laura Gentry MRN: [id number] DOB: 01-22-1956 Today's Date: 05/20/2024   History of Present Illness   Pt is a 68 y.o female admitted 12/25 for persistent diarrhea. Recently d/c 12/21 for sepsis d/t UTI. PMH: brain aneurysm s/p clipping 02/2013, bilateral cataracts, DM II, HLD, osteoporosis, lumbosacral radiculopathy, hypothyroidism, CAD     Clinical Impressions Pt admitted based on above, and was seen based on problem list below. PTA pt was independent with ADLs and IADLs. Today pt is requiring s for safety with ADLs and functional transfers. Pt slightly below baseline today. Mildly unsteady with mobility and decreased activity tolerance. Orthostatics were recorded during the session, pt with BP drop with supine to sit transition however, stable afterwards. Pt asymptomatic throughout. Anticipate with continued mobilization pt will progress well, no follow up OT needs. OT will continue to follow acutely to maximize functional independence.      05/20/24 1100  Orthostatic Lying   BP- Lying 132/71  Orthostatic Sitting  BP- Sitting 106/70  Orthostatic Standing at 0 minutes  BP- Standing at 0 minutes 92/60  Orthostatic Standing at 3 minutes  BP- Standing at 3 minutes 101/55       If plan is discharge home, recommend the following:   A little help with walking and/or transfers;A little help with bathing/dressing/bathroom;Assistance with cooking/housework;Assist for transportation     Functional Status Assessment   Patient has had a recent decline in their functional status and demonstrates the ability to make significant improvements in function in a reasonable and predictable amount of time.     Equipment Recommendations   None recommended by OT      Precautions/Restrictions   Precautions Precautions: Fall Recall of Precautions/Restrictions: Intact Restrictions Weight Bearing Restrictions Per Provider Order:  No     Mobility Bed Mobility Overal bed mobility: Independent      Transfers Overall transfer level: Needs assistance Equipment used: None Transfers: Sit to/from Stand Sit to Stand: Supervision       General transfer comment: S for safety, slightly below baseline      Balance Overall balance assessment: Needs assistance Sitting-balance support: No upper extremity supported, Feet supported Sitting balance-Leahy Scale: Good     Standing balance support: No upper extremity supported, During functional activity Standing balance-Leahy Scale: Fair Standing balance comment: Mild sway with mobility           ADL either performed or assessed with clinical judgement   ADL Overall ADL's : Needs assistance/impaired Eating/Feeding: Set up;Sitting   Grooming: Supervision/safety;Standing           Upper Body Dressing : Set up;Sitting   Lower Body Dressing: Supervision/safety;Sit to/from stand   Toilet Transfer: Supervision/safety;Ambulation   Toileting- Clothing Manipulation and Hygiene: Supervision/safety;Sit to/from stand       Functional mobility during ADLs: Supervision/safety General ADL Comments: S for safety, mild sway with no AD     Vision Baseline Vision/History: 0 No visual deficits Vision Assessment?: No apparent visual deficits            Pertinent Vitals/Pain Pain Assessment Pain Assessment: No/denies pain     Extremity/Trunk Assessment Upper Extremity Assessment Upper Extremity Assessment: Overall WFL for tasks assessed   Lower Extremity Assessment Lower Extremity Assessment: Defer to PT evaluation   Cervical / Trunk Assessment Cervical / Trunk Assessment: Normal   Communication Communication Communication: No apparent difficulties   Cognition Arousal: Alert Behavior During Therapy: WFL for tasks assessed/performed Cognition: No apparent impairments  OT - Cognition Comments: Pt mildly tangiental but largely [facility]     Following  commands: Intact       Cueing  General Comments   Cueing Techniques: Verbal cues  Orthostatics recorded during session. Pt asymptomatic throughout.           Home Living Family/patient expects to be discharged to:: Private residence Living Arrangements: Other relatives Available Help at Discharge: Family;Available 24 hours/day Type of Home: House Home Access: Stairs to enter [employer] of Steps: 5+ 5 Entrance Stairs-Rails: Can reach both Home Layout: Multi-level;Able to live on main level with bedroom/bathroom Alternate Level Stairs-Number of Steps: 15 Alternate Level Stairs-Rails: Right;Left Bathroom Shower/Tub: Tub/shower unit;Walk-in shower   Bathroom Toilet: Handicapped height Bathroom Accessibility: Yes How Accessible: Accessible via walker Home Equipment: Rolling Walker (2 wheels);Cane - single point;Shower seat;BSC/3in1;Grab bars - toilet;Grab bars - tub/shower          Prior Functioning/Environment Prior Level of Function : Independent/Modified Independent     Mobility Comments: Ind, no AD ADLs Comments: Ind, driving    OT Problem List: Decreased strength;Impaired balance (sitting and/or standing);Cardiopulmonary status limiting activity   OT Treatment/Interventions: Self-care/ADL training;Therapeutic exercise;Energy conservation;Therapeutic activities;Patient/family education;Balance training      OT Goals(Current goals can be found in the care plan section)   Acute Rehab OT Goals Patient Stated Goal: To go home OT Goal Formulation: With patient Time For Goal Achievement: 06/02/24 Potential to Achieve Goals: Good   OT Frequency:  Min 1X/week       AM-PAC OT 6 Clicks Daily Activity     Outcome Measure Help from another person eating meals?: None Help from another person taking care of personal grooming?: A Little Help from another person toileting, which includes using toliet, bedpan, or urinal?: A Little Help from another person  bathing (including washing, rinsing, drying)?: A Little Help from another person to put on and taking off regular upper body clothing?: A Little Help from another person to put on and taking off regular lower body clothing?: A Little 6 Click Score: 19   End of Session Equipment Utilized During Treatment: Gait belt Nurse Communication: Mobility status  Activity Tolerance: Patient tolerated treatment well Patient left: in chair;with call bell/phone within reach  OT Visit Diagnosis: Unsteadiness on feet (R26.81);Other abnormalities of gait and mobility (R26.89);Muscle weakness (generalized) (M62.81)                Time: 8983-8960 OT Time Calculation (min): 23 min Charges:  OT General Charges $OT Visit: 1 Visit OT Evaluation $OT Eval Moderate Complexity: 1 Mod  Adrianne BROCKS, OT  Acute Rehabilitation Services Office [phone] Secure chat preferred   Adrianne GORMAN Savers 05/20/2024, 11:19 AM

## 2024-05-20 NOTE — Assessment & Plan Note (Signed)
 Hypokalemia and hypomagnesemia. Likely pre renal renal failure.   Stable renal function with serum cr at 0,78 with K at 3,7 and serum bicarbonate at 24 Na 136   Plan to add Kcl 40 meq x1 and follow up renal function and electrolytes in am Follow up on Mg.  Avoid hypotension and nephrotoxic medications

## 2024-05-20 NOTE — Progress Notes (Addendum)
 " Progress Note   Patient: Laura Gentry FMW:[id number] DOB: [birth year] DOA: 05/18/2024     1 DOS: the patient was seen and examined on 05/20/2024   Brief hospital course: [name] was admitted to the hospital with the working diagnosis of diarrhea.   68 yo female with the past medical history of T2DM, hyperlipidemia, coronary artery disease, and hypothyroidism who presented with diarrhea and dyspnea.  Reported persistent symptoms for the last 7 days, with no fever or vomiting. More than 10 episodes of diarrhea per day.  Recent hospitalization for sepsis due to urinary tract  infection, 12/18 to 05/14/24, with ESBL E Coli, treated with 3 days of meropenem . Hospitalization complicated with diarrhea and SVT. On her initial physical examination her blood pressure was 115/68, HR 86, RR 20 and 02 saturation 97%  Lungs with no wheezing or rhonchi, heart with S1 and S2 present and regular, with no gallops or rubs, abdomen soft and non tender, not distended, no lower extremity edema.   Na 134, K 3,4 Cl 95 bicarbonate 26 glucose 148, bun 24 cr 1,23 AST 18 ALT 22 BNP 2,053  High sensitive troponin 31  Lactic acid 1,8  Wbc 18.6 hgb 11.5 plt 360  Urine analysis SG 1,012, protein negative, trace leukocytes and negative hgb   Chest radiograph with hypoinflation, right rotation, positive cardiomegaly, bilateral hilar vascular congestion and small bilateral pleural effusions.   EKG 78 bpm, normal axis, normal intervals, sinus rhythm with left atrial enlargement, with no significant ST segment or T wave changes.     Assessment and Plan: * Diarrhea Patient with recent exposure to broad spectrum antibiotic therapy with meropenem  for 3 days  Today diarrhea is improving.  High pre test probability for C diff, considering leukocytosis on admission,   Plan to continue supportive medical therapy with oral vancomycin  Patient had IV fluids on admission.  Follow up cell count, temperature curve and  stool studies.   Hypotension History of dystonia with chronic hypotension Continue midodrine   Coronary artery disease Last cardiac cath 2025 June.  Showing single-vessel obstructive disease, first diagonal, 2 small for PCI. Continue home medication of Ranexa , statins,  High sensitive troponin elevation not consistent with acute coronary syndrome.   AKI (acute kidney injury) Hypokalemia and hypomagnesemia. Likely pre renal renal failure.   Renal function today with serum cr at 0,8 with K at 3,3 and serum bicarbonate at 28  Na 139   Plan to add Kcl 40 meq x2 and follow up renal function and electrolytes in am Follow up on Mg.  Avoid hypotension and nephrotoxic medications   Iron deficiency anemia Monitoring H&H closely -Continue iron supplements  Acquired hypothyroidism Not on levothyroxine, her TSH on 12/19 was 3.9 Plan to follow up as outpatient   Type 2 diabetes mellitus with hyperlipidemia (HCC) Uncontrolled T2DM with hyperglycemia, with Hgb A1c 7.0 Plan to continue insulin  sliding scale for glucose cover and monitoring Continue with statin   History of depression Continue with sertraline  and trazodone    Subjective: Patient is feeling better, diarrhea is improving, no chest pain, no dyspnea, no PND, orthopnea or lower extremity edema   Physical Exam: Vitals:   05/19/24 2300 05/20/24 0400 05/20/24 0630 05/20/24 0700  BP: (!) 112/53 (!) 104/56  (!) 101/53  Pulse: 65 63  63  Resp: 19 19  18   Temp: 98.7 F (37.1 C) 98.4 F (36.9 C)  97.9 F (36.6 C)  TempSrc: Oral Oral  Oral  SpO2: 90% 91%  92%  Weight:   78 kg   Height:       Neurology awake and alert, deconditioned ENT with mild pallor with no icterus Cardiovascular with S1 and S2 present and regular with no gallops, rubs or murmurs No JVD Respiratory with no rales or wheezing, no rhonchi, mild decreased breath sounds at bases Abdomen soft and not tender, not distended No lower extremity edema   Data  Reviewed:    Family Communication: I spoke with patient's sister over the phone at the bedside, we talked in detail about patient's condition, plan of care and prognosis and all questions were addressed.   Disposition: Status is: Inpatient Remains inpatient appropriate because: diarrhea   Planned Discharge Destination: Home     Author: Elidia Toribio Furnace, MD 05/20/2024 10:37 AM  For on call review [email or web address].  "

## 2024-05-20 NOTE — Plan of Care (Signed)

## 2024-05-20 NOTE — Evaluation (Signed)
 Physical Therapy Evaluation Patient Details Name: Laura Gentry MRN: [id number] DOB: November 01, 1955 Today's Date: 05/20/2024  History of Present Illness  Pt is a 68 y.o female admitted 12/25 for persistent diarrhea. Recently d/c 12/21 for sepsis d/t UTI. PMH: brain aneurysm s/p clipping 02/2013, bilateral cataracts, DM II, HLD, osteoporosis, lumbosacral radiculopathy, hypothyroidism, CAD   Clinical Impression  Pt presents with condition above and deficits mentioned below, see PT Problem List. PTA, she was independent without DME, living with family in a multi-level house with 5 + 5 STE and x15 stairs between floors. She is currently displaying some mild balance and endurance deficits from reportedly not being OOB much since admission. She is currently able to mobilize/ambulate slowly without LOB with a mild drift, only needing supervision for safety. She will likely progress well and quickly as she mobilizes more frequently. Thus, educated pt on how to [facility] and rehook her lines and to call nursing desk to notify them when she is unhooking them to walk around room or to bathroom. Educated pt to not get up without nursing if she begins to feel dizzy or lightheaded. Pt verbalized understanding. RN in agreement with plan to allow pt to get up and walk around the room on her own provided pt follows the directions/precautions listed above. She will likely not need post acute PT provided she progresses as anticipated. Will continue to follow acutely to maximize her return to baseline prior to d/c.   VSS on RA throughout      If plan is discharge home, recommend the following: Help with stairs or ramp for entrance;Assistance with cooking/housework   Can travel by private vehicle        Equipment Recommendations None recommended by PT  Recommendations for Other Services       Functional Status Assessment Patient has had a recent decline in their functional status and demonstrates the ability to  make significant improvements in function in a reasonable and predictable amount of time.     Precautions / Restrictions Precautions Precautions: Fall (low risk) Recall of Precautions/Restrictions: Intact Restrictions Weight Bearing Restrictions Per Provider Order: No      Mobility  Bed Mobility Overal bed mobility: Modified Independent             General bed mobility comments: HOB elevated, no assistance needed    Transfers Overall transfer level: Needs assistance Equipment used: None Transfers: Sit to/from Stand Sit to Stand: Supervision           General transfer comment: Supervision for safety standing from EOB, no LOB    Ambulation/Gait Ambulation/Gait assistance: Supervision Gait Distance (Feet): 360 Feet Assistive device: None Gait Pattern/deviations: Step-through pattern, Decreased stride length, Decreased step length - right, Decreased step length - left, Drifts right/left Gait velocity: reduced Gait velocity interpretation: <1.8 ft/sec, indicate of risk for recurrent falls   General Gait Details: Pt with slow, small bil steps and very mild lateral drift. No LOB though, even with dynamic challenges like turning and nodding her head. Her gait speed and step length improved as distance progressed. Supervision for safety  Stairs            Wheelchair Mobility     Tilt Bed    Modified Rankin (Stroke Patients Only)       Balance Overall balance assessment: Needs assistance Sitting-balance support: No upper extremity supported, Feet supported Sitting balance-Leahy Scale: Good     Standing balance support: No upper extremity supported, During functional activity Standing balance-Leahy Scale: Fair  Standing balance comment: Mild drift with mobility but no LOB                             Pertinent Vitals/Pain Pain Assessment Pain Assessment: Faces Faces Pain Scale: No hurt Pain Intervention(s): Monitored during session     Home Living Family/patient expects to be discharged to:: Private residence Living Arrangements: Other relatives Available Help at Discharge: Family;Available 24 hours/day Type of Home: House Home Access: Stairs to enter Entrance Stairs-Rails: Can reach both Entrance Stairs-Number of Steps: 5+ 5 Alternate Level Stairs-Number of Steps: 15 Home Layout: Multi-level;Able to live on main level with bedroom/bathroom Home Equipment: Rolling Walker (2 wheels);Cane - single point;Shower seat;BSC/3in1;Grab bars - toilet;Grab bars - tub/shower      Prior Function Prior Level of Function : Independent/Modified Independent             Mobility Comments: Ind, no AD ADLs Comments: Ind, driving     Extremity/Trunk Assessment   Upper Extremity Assessment Upper Extremity Assessment: Defer to OT evaluation    Lower Extremity Assessment Lower Extremity Assessment: Overall WFL for tasks assessed    Cervical / Trunk Assessment Cervical / Trunk Assessment: Normal  Communication   Communication Communication: No apparent difficulties    Cognition Arousal: Alert Behavior During Therapy: WFL for tasks assessed/performed   PT - Cognitive impairments: No apparent impairments                         Following commands: Intact       Cueing Cueing Techniques: Verbal cues     General Comments General comments (skin integrity, edema, etc.): VSS on RA; Educated pt on how to pitney bowes and rehook her lines and to call nursing desk to notify them when she is unhooking them to walk around room or to bathroom. Educated pt to not get up without nursing if she begins to feel dizzy or lightheaded. Pt verbalized understanding. RN agreement with plan to allow pt to get up and walk around the room on her own provided pt follows the directions/precautions listed above.    Exercises     Assessment/Plan    PT Assessment Patient needs continued PT services  PT Problem List Decreased activity  tolerance;Decreased balance;Decreased mobility       PT Treatment Interventions DME instruction;Gait training;Stair training;Functional mobility training;Therapeutic activities;Therapeutic exercise;Balance training;Neuromuscular re-education;Patient/family education    PT Goals (Current goals can be found in the Care Plan section)  Acute Rehab PT Goals Patient Stated Goal: to improve PT Goal Formulation: With patient Time For Goal Achievement: 06/03/24 Potential to Achieve Goals: Good    Frequency Min 1X/week     Co-evaluation               AM-PAC PT 6 Clicks Mobility  Outcome Measure Help needed turning from your back to your side while in a flat bed without using bedrails?: None Help needed moving from lying on your back to sitting on the side of a flat bed without using bedrails?: None Help needed moving to and from a bed to a chair (including a wheelchair)?: A Little Help needed standing up from a chair using your arms (e.g., wheelchair or bedside chair)?: A Little Help needed to walk in hospital room?: A Little Help needed climbing 3-5 steps with a railing? : A Little 6 Click Score: 20    End of Session   Activity Tolerance: Patient tolerated treatment  well Patient left: in bed;with call bell/phone within reach Nurse Communication: Mobility status;Other (comment) (RN ok with pt getting up on her own) PT Visit Diagnosis: Unsteadiness on feet (R26.81);Other abnormalities of gait and mobility (R26.89)    Time: 8365-8349 PT Time Calculation (min) (ACUTE ONLY): 16 min   Charges:   PT Evaluation $PT Eval Low Complexity: 1 Low   PT General Charges $$ ACUTE PT VISIT: 1 Visit         Theo Ferretti, PT, DPT Acute Rehabilitation Services  Office: [phone]   Theo CHRISTELLA Ferretti 05/20/2024, 5:29 PM

## 2024-05-20 NOTE — Plan of Care (Signed)
  Problem: Clinical Measurements: Goal: Diagnostic test results will improve Outcome: Progressing   Problem: Coping: Goal: Level of anxiety will decrease Outcome: Progressing   Problem: Pain Managment: Goal: General experience of comfort will improve and/or be controlled Outcome: Progressing   Problem: Safety: Goal: Ability to remain free from injury will improve Outcome: Progressing

## 2024-05-21 DIAGNOSIS — N179 Acute kidney failure, unspecified: Secondary | ICD-10-CM | POA: Diagnosis not present

## 2024-05-21 DIAGNOSIS — R197 Diarrhea, unspecified: Secondary | ICD-10-CM

## 2024-05-21 DIAGNOSIS — I2510 Atherosclerotic heart disease of native coronary artery without angina pectoris: Secondary | ICD-10-CM | POA: Diagnosis not present

## 2024-05-21 DIAGNOSIS — I959 Hypotension, unspecified: Secondary | ICD-10-CM | POA: Diagnosis not present

## 2024-05-21 LAB — BASIC METABOLIC PANEL WITH GFR
Anion gap: 8 (ref 5–15)
BUN: 15 mg/dL (ref 8–23)
CO2: 24 mmol/L (ref 22–32)
Calcium: 8.5 mg/dL — ABNORMAL LOW (ref 8.9–10.3)
Chloride: 103 mmol/L (ref 98–111)
Creatinine, Ser: 0.78 mg/dL (ref 0.44–1.00)
GFR, Estimated: >60 mL/min
Glucose, Bld: 161 mg/dL — ABNORMAL HIGH (ref 70–99)
Potassium: 3.7 mmol/L (ref 3.5–5.1)
Sodium: 136 mmol/L (ref 135–145)

## 2024-05-21 LAB — GLUCOSE, CAPILLARY
Glucose-Capillary: 177 mg/dL — ABNORMAL HIGH (ref 70–99)
Glucose-Capillary: 158 mg/dL — ABNORMAL HIGH (ref 70–99)
Glucose-Capillary: 127 mg/dL — ABNORMAL HIGH (ref 70–99)
Glucose-Capillary: 114 mg/dL — ABNORMAL HIGH (ref 70–99)

## 2024-05-21 LAB — CBC
HCT: 34.1 % — ABNORMAL LOW (ref 36.0–46.0)
Hemoglobin: 11.1 g/dL — ABNORMAL LOW (ref 12.0–15.0)
MCH: 25.6 pg — ABNORMAL LOW (ref 26.0–34.0)
MCHC: 32.6 g/dL (ref 30.0–36.0)
MCV: 78.8 fL — ABNORMAL LOW (ref 80.0–100.0)
Platelets: 403 K/uL — ABNORMAL HIGH (ref 150–400)
RBC: 4.33 MIL/uL (ref 3.87–5.11)
RDW: 13.8 % (ref 11.5–15.5)
WBC: 9 K/uL (ref 4.0–10.5)
nRBC: 0 % (ref 0.0–0.2)

## 2024-05-21 MED ORDER — POTASSIUM CHLORIDE CRYS ER 20 MEQ PO TBCR
40.0000 meq | EXTENDED_RELEASE_TABLET | Freq: Once | ORAL | Status: AC
  Administered 2024-05-21: 40 meq via ORAL
  Filled 2024-05-21: qty 2

## 2024-05-21 NOTE — Progress Notes (Signed)
 " Progress Note   Patient: Laura Gentry FMW:[id number] DOB: 1955/11/18 DOA: 05/18/2024     2 DOS: the patient was seen and examined on 05/21/2024   Brief hospital course: Laura Gentry was admitted to the hospital with the working diagnosis of diarrhea.   68 yo female with the past medical history of T2DM, hyperlipidemia, coronary artery disease, and hypothyroidism who presented with diarrhea and dyspnea.  Reported persistent symptoms for the last 7 days, with no fever or vomiting. More than 10 episodes of diarrhea per day.  Recent hospitalization for sepsis due to urinary tract  infection, 12/18 to 05/14/24, with ESBL E Coli, treated with 3 days of meropenem . Hospitalization complicated with diarrhea and SVT. On her initial physical examination her blood pressure was 115/68, HR 86, RR 20 and 02 saturation 97%  Lungs with no wheezing or rhonchi, heart with S1 and S2 present and regular, with no gallops or rubs, abdomen soft and non tender, not distended, no lower extremity edema.   Na 134, K 3,4 Cl 95 bicarbonate 26 glucose 148, bun 24 cr 1,23 AST 18 ALT 22 BNP 2,053  High sensitive troponin 31  Lactic acid 1,8  Wbc 18.6 hgb 11.5 plt 360  Urine analysis SG 1,012, protein negative, trace leukocytes and negative hgb   Chest radiograph with hypoinflation, right rotation, positive cardiomegaly, bilateral hilar vascular congestion and small bilateral pleural effusions.   EKG 78 bpm, normal axis, normal intervals, sinus rhythm with left atrial enlargement, with no significant ST segment or T wave changes.   12/28 continue to have diarrhea    Assessment and Plan: * Acute diarrhea Patient with recent exposure to broad spectrum antibiotic therapy with meropenem  for 3 days  Persistent diarrhea  Follow up C diff testing negative.  Wbc is down to 9.0   Plan to continue supportive medical therapy  Considering negative C diff will dc oral vancomycin   Continue close monitoring  Encourage  po intake and consult nutrition   Hypotension History of dystonia with chronic hypotension Continue midodrine   Coronary artery disease Last cardiac cath 2025 June.  Showing single-vessel obstructive disease, first diagonal, 2 small for PCI. Continue home medication of Ranexa , statins,  High sensitive troponin elevation not consistent with acute coronary syndrome.   AKI (acute kidney injury) Hypokalemia and hypomagnesemia. Likely pre renal renal failure.   Stable renal function with serum cr at 0,78 with K at 3,7 and serum bicarbonate at 24 Na 136   Plan to add Kcl 40 meq x1 and follow up renal function and electrolytes in am Follow up on Mg.  Avoid hypotension and nephrotoxic medications   Iron deficiency anemia Monitoring H&H closely -Continue iron supplements  Acquired hypothyroidism Not on levothyroxine, her TSH on 12/19 was 3.9 Plan to follow up as outpatient   Type 2 diabetes mellitus with hyperlipidemia (HCC) Uncontrolled T2DM with hyperglycemia, with Hgb A1c 7.0 Plan to continue insulin  sliding scale for glucose cover and monitoring Continue with statin   History of depression Continue with sertraline  and trazodone    Paroxysmal SVT (supraventricular tachycardia) Continue with metoprolol  succinate 12.5 mg daily  Recent echocardiogram 04.2025 with preserved LV systolic function with EF 50 to 60%, possible basal antero lateral hypokinesis. Grade I diastolic dysfunction with impaired relaxation, RV systolic function preserved, LA with mild dilatation, no significant valvular disease.   For now will ruled out congested heart failure          Subjective: Patient continue to have diarrhea overnight, no nausea or  vomiting, she is having poor po intake and generalized weakness   Physical Exam: Vitals:   05/20/24 2345 05/20/24 2349 05/21/24 0418 05/21/24 0728  BP: 136/65  136/67 137/60  Pulse: 76  74 68  Resp: (!) 22  (!) 22 19  Temp: 98.5 F (36.9 C)  (!) 97  F (36.1 C) 97.9 F (36.6 C)  TempSrc: Oral  Oral Oral  SpO2: 91% 93% 94% 90%  Weight:   77.8 kg   Height:       Neurology awake and alert ENT with mild pallor Cardiovascular with S1 and S2 present and regular with no gallops rubs or murmurs No JVD Respiratory with no rales or wheezing, no rhonchi Abdomen with no distention, soft and non tender No lower extremity edema   Data Reviewed:    Family Communication: no family at the bedside   Disposition: Status is: Inpatient Remains inpatient appropriate because: pending improvement   Planned Discharge Destination: Home     Author: Elidia Toribio Furnace, MD 05/21/2024 10:21 AM  For on call review [email or web address]  "

## 2024-05-22 DIAGNOSIS — R197 Diarrhea, unspecified: Secondary | ICD-10-CM | POA: Diagnosis not present

## 2024-05-22 DIAGNOSIS — I2510 Atherosclerotic heart disease of native coronary artery without angina pectoris: Secondary | ICD-10-CM | POA: Diagnosis not present

## 2024-05-22 DIAGNOSIS — I959 Hypotension, unspecified: Secondary | ICD-10-CM | POA: Diagnosis not present

## 2024-05-22 DIAGNOSIS — N179 Acute kidney failure, unspecified: Secondary | ICD-10-CM | POA: Diagnosis not present

## 2024-05-22 LAB — BASIC METABOLIC PANEL WITH GFR
Anion gap: 11 (ref 5–15)
BUN: 8 mg/dL (ref 8–23)
CO2: 24 mmol/L (ref 22–32)
Calcium: 8.4 mg/dL — ABNORMAL LOW (ref 8.9–10.3)
Chloride: 103 mmol/L (ref 98–111)
Creatinine, Ser: 0.73 mg/dL (ref 0.44–1.00)
GFR, Estimated: >60 mL/min
Glucose, Bld: 124 mg/dL — ABNORMAL HIGH (ref 70–99)
Potassium: 3.5 mmol/L (ref 3.5–5.1)
Sodium: 138 mmol/L (ref 135–145)

## 2024-05-22 LAB — CBC
HCT: 35.9 % — ABNORMAL LOW (ref 36.0–46.0)
Hemoglobin: 11.7 g/dL — ABNORMAL LOW (ref 12.0–15.0)
MCH: 25.4 pg — ABNORMAL LOW (ref 26.0–34.0)
MCHC: 32.6 g/dL (ref 30.0–36.0)
MCV: 78 fL — ABNORMAL LOW (ref 80.0–100.0)
Platelets: 418 K/uL — ABNORMAL HIGH (ref 150–400)
RBC: 4.6 MIL/uL (ref 3.87–5.11)
RDW: 13.6 % (ref 11.5–15.5)
WBC: 10 K/uL (ref 4.0–10.5)
nRBC: 0 % (ref 0.0–0.2)

## 2024-05-22 LAB — MAGNESIUM: Magnesium: 1.4 mg/dL — ABNORMAL LOW (ref 1.7–2.4)

## 2024-05-22 LAB — GLUCOSE, CAPILLARY
Glucose-Capillary: 139 mg/dL — ABNORMAL HIGH (ref 70–99)
Glucose-Capillary: 163 mg/dL — ABNORMAL HIGH (ref 70–99)
Glucose-Capillary: 199 mg/dL — ABNORMAL HIGH (ref 70–99)
Glucose-Capillary: 143 mg/dL — ABNORMAL HIGH (ref 70–99)

## 2024-05-22 MED ORDER — MAGNESIUM SULFATE 4 GM/100ML IV SOLN
4.0000 g | Freq: Once | INTRAVENOUS | Status: AC
  Administered 2024-05-22: 4 g via INTRAVENOUS
  Filled 2024-05-22: qty 100

## 2024-05-22 MED ORDER — POTASSIUM CHLORIDE 20 MEQ PO PACK
40.0000 meq | PACK | Freq: Once | ORAL | Status: AC
  Administered 2024-05-22: 40 meq via ORAL
  Filled 2024-05-22: qty 2

## 2024-05-22 MED ORDER — LOPERAMIDE HCL 2 MG PO CAPS
2.0000 mg | ORAL_CAPSULE | Freq: Three times a day (TID) | ORAL | Status: AC
  Administered 2024-05-22 (×3): 2 mg via ORAL
  Filled 2024-05-22 (×3): qty 1

## 2024-05-22 NOTE — Plan of Care (Signed)
  Problem: Education: Goal: Ability to describe self-care measures that may prevent or decrease complications (Diabetes Survival Skills Education) will improve Outcome: Progressing   Problem: Skin Integrity: Goal: Risk for impaired skin integrity will decrease Outcome: Progressing   Problem: Activity: Goal: Risk for activity intolerance will decrease Outcome: Progressing   Problem: Pain Managment: Goal: General experience of comfort will improve and/or be controlled Outcome: Progressing   Problem: Safety: Goal: Ability to remain free from injury will improve Outcome: Progressing   Problem: Skin Integrity: Goal: Risk for impaired skin integrity will decrease Outcome: Progressing

## 2024-05-22 NOTE — Plan of Care (Signed)

## 2024-05-22 NOTE — Progress Notes (Addendum)
 " Progress Note   Patient: Laura Gentry FMW:[id number] DOB: 1955/09/22 DOA: 05/18/2024     3 DOS: the patient was seen and examined on 05/22/2024   Brief hospital course: [name] was admitted to the hospital with the working diagnosis of diarrhea.   68 yo female with the past medical history of T2DM, hyperlipidemia, coronary artery disease, and hypothyroidism who presented with diarrhea and dyspnea.  Reported persistent symptoms for the last 7 days, with no fever or vomiting. More than 10 episodes of diarrhea per day.  Recent hospitalization for sepsis due to urinary tract  infection, 12/18 to 05/14/24, with ESBL E Coli, treated with 3 days of meropenem . Hospitalization complicated with diarrhea and SVT. On her initial physical examination her blood pressure was 115/68, HR 86, RR 20 and 02 saturation 97%  Lungs with no wheezing or rhonchi, heart with S1 and S2 present and regular, with no gallops or rubs, abdomen soft and non tender, not distended, no lower extremity edema.   Na 134, K 3,4 Cl 95 bicarbonate 26 glucose 148, bun 24 cr 1,23 AST 18 ALT 22 BNP 2,053  High sensitive troponin 31  Lactic acid 1,8  Wbc 18.6 hgb 11.5 plt 360  Urine analysis SG 1,012, protein negative, trace leukocytes and negative hgb   Chest radiograph with hypoinflation, right rotation, positive cardiomegaly, bilateral hilar vascular congestion and small bilateral pleural effusions.   EKG 78 bpm, normal axis, normal intervals, sinus rhythm with left atrial enlargement, with no significant ST segment or T wave changes.   12/28 continue to have diarrhea  12/29 diarrhea has been persistent   Assessment and Plan: * Acute diarrhea Patient with recent exposure to broad spectrum antibiotic therapy with meropenem  for 3 days  Persistent diarrhea  Follow up C diff testing negative.  Wbc is down to 9.0   Plan to continue supportive medical therapy  Considering negative C diff will dc oral vancomycin    Continue close monitoring  Encourage po intake and consult nutrition   Hypotension History of dystonia with chronic hypotension Continue midodrine   Coronary artery disease Last cardiac cath 2025 June.  Showing single-vessel obstructive disease, first diagonal, 2 small for PCI. Continue home medication of Ranexa , statins,  High sensitive troponin elevation not consistent with acute coronary syndrome.   AKI (acute kidney injury) Hypokalemia and hypomagnesemia. Likely pre renal renal failure.   Stable renal function with serum cr at 0,78 with K at 3,7 and serum bicarbonate at 24 Na 136   Plan to add Kcl 40 meq x1 and follow up renal function and electrolytes in am Follow up on Mg.  Avoid hypotension and nephrotoxic medications   Iron deficiency anemia Monitoring H&H closely -Continue iron supplements  Acquired hypothyroidism Not on levothyroxine, her TSH on 12/19 was 3.9 Plan to follow up as outpatient   Type 2 diabetes mellitus with hyperlipidemia (HCC) Uncontrolled T2DM with hyperglycemia, with Hgb A1c 7.0 Plan to continue insulin  sliding scale for glucose cover and monitoring Continue with statin   History of depression Continue with sertraline  and trazodone    Paroxysmal SVT (supraventricular tachycardia) Continue with metoprolol  succinate 12.5 mg daily  Recent echocardiogram 04.2025 with preserved LV systolic function with EF 50 to 60%, possible basal antero lateral hypokinesis. Grade I diastolic dysfunction with impaired relaxation, RV systolic function preserved, LA with mild dilatation, no significant valvular disease.   For now will ruled out congested heart failure    Subjective: Patient with persistent diarrhea, no abdominal pain, no chest pain  and no dyspnea, no nausea or vomiting, she has not been eating well   Physical Exam: Vitals:   05/21/24 1922 05/22/24 0512 05/22/24 0817 05/22/24 1132  BP: (!) 167/77 (!) 165/86 (!) 167/73 (!) 148/62  Pulse: 65 71     Resp: 19 16 18 18   Temp: 97.6 F (36.4 C) 98.4 F (36.9 C) 98.7 F (37.1 C) 98.7 F (37.1 C)  TempSrc: Oral Oral Oral Oral  SpO2: 93% 96% 97% 98%  Weight:  75.6 kg    Height:       Neurology awake and alert ENT with mild pallor Cardiovascular with S1 and S2 present and regular, with no gallops or rubs Respiratory with no wheezing or rhonchi, no rales Abdomen with no distention  No lower extremity edema   Data Reviewed:    Family Communication: no family at the bedside   Disposition: Status is: Inpatient Remains inpatient appropriate because: pending diarrhea to improve   Planned Discharge Destination: Home     Author: Elidia Toribio Furnace, MD 05/22/2024 1:06 PM  For on call review [email or web address]  "

## 2024-05-22 NOTE — Progress Notes (Signed)
 Mobility Specialist Progress Note:   05/22/24 1300  Mobility  Activity Ambulated with assistance  Level of Assistance Standby assist, set-up cues, supervision of patient - no hands on  Assistive Device None  Distance Ambulated (ft) 500 ft  Activity Response Tolerated well  Mobility Referral Yes  Mobility visit 1 Mobility  Mobility Specialist Start Time (ACUTE ONLY) 0910  Mobility Specialist Stop Time (ACUTE ONLY) [id number]  Mobility Specialist Time Calculation (min) (ACUTE ONLY) 13 min   Received pt in chair having no complaints and agreeable to mobility. Pt was asymptomatic throughout ambulation and returned to room w/o fault. Left in chair w/ call bell in reach and all needs met.   Thersia Minder Mobility Specialist  Please contact vis Secure Chat or  Rehab Office [phone]

## 2024-05-22 NOTE — Plan of Care (Signed)
" °  Problem: Health Behavior/Discharge Planning: Goal: Ability to identify and utilize available resources and services will improve Outcome: Progressing Goal: Ability to manage health-related needs will improve Outcome: Progressing   Problem: Nutritional: Goal: Maintenance of adequate nutrition will improve Outcome: Progressing   Problem: Education: Goal: Knowledge of General Education information will improve Description: Including pain rating scale, medication(s)/side effects and non-pharmacologic comfort measures Outcome: Progressing   Problem: Clinical Measurements: Goal: Will remain free from infection Outcome: Progressing   Problem: Activity: Goal: Risk for activity intolerance will decrease Outcome: Progressing   Problem: Elimination: Goal: Will not experience complications related to bowel motility Outcome: Progressing   "

## 2024-05-23 LAB — BASIC METABOLIC PANEL WITH GFR
Anion gap: 10 (ref 5–15)
BUN: 9 mg/dL (ref 8–23)
CO2: 25 mmol/L (ref 22–32)
Calcium: 8.7 mg/dL — ABNORMAL LOW (ref 8.9–10.3)
Chloride: 103 mmol/L (ref 98–111)
Creatinine, Ser: 0.84 mg/dL (ref 0.44–1.00)
GFR, Estimated: >60 mL/min
Glucose, Bld: 154 mg/dL — ABNORMAL HIGH (ref 70–99)
Potassium: 4 mmol/L (ref 3.5–5.1)
Sodium: 138 mmol/L (ref 135–145)

## 2024-05-23 LAB — GLUCOSE, CAPILLARY
Glucose-Capillary: 152 mg/dL — ABNORMAL HIGH (ref 70–99)
Glucose-Capillary: 265 mg/dL — ABNORMAL HIGH (ref 70–99)

## 2024-05-23 LAB — MAGNESIUM: Magnesium: 2.2 mg/dL (ref 1.7–2.4)

## 2024-05-23 IMAGING — CR DG WRIST 2V*L*
2 series · 2 of 2 positions shown · non-contrast
Comparison: None Available.

CLINICAL DATA: Left wrist pain.  Recent fall.

EXAM:
LEFT WRIST - 2 VIEW

[x wrist pa left]
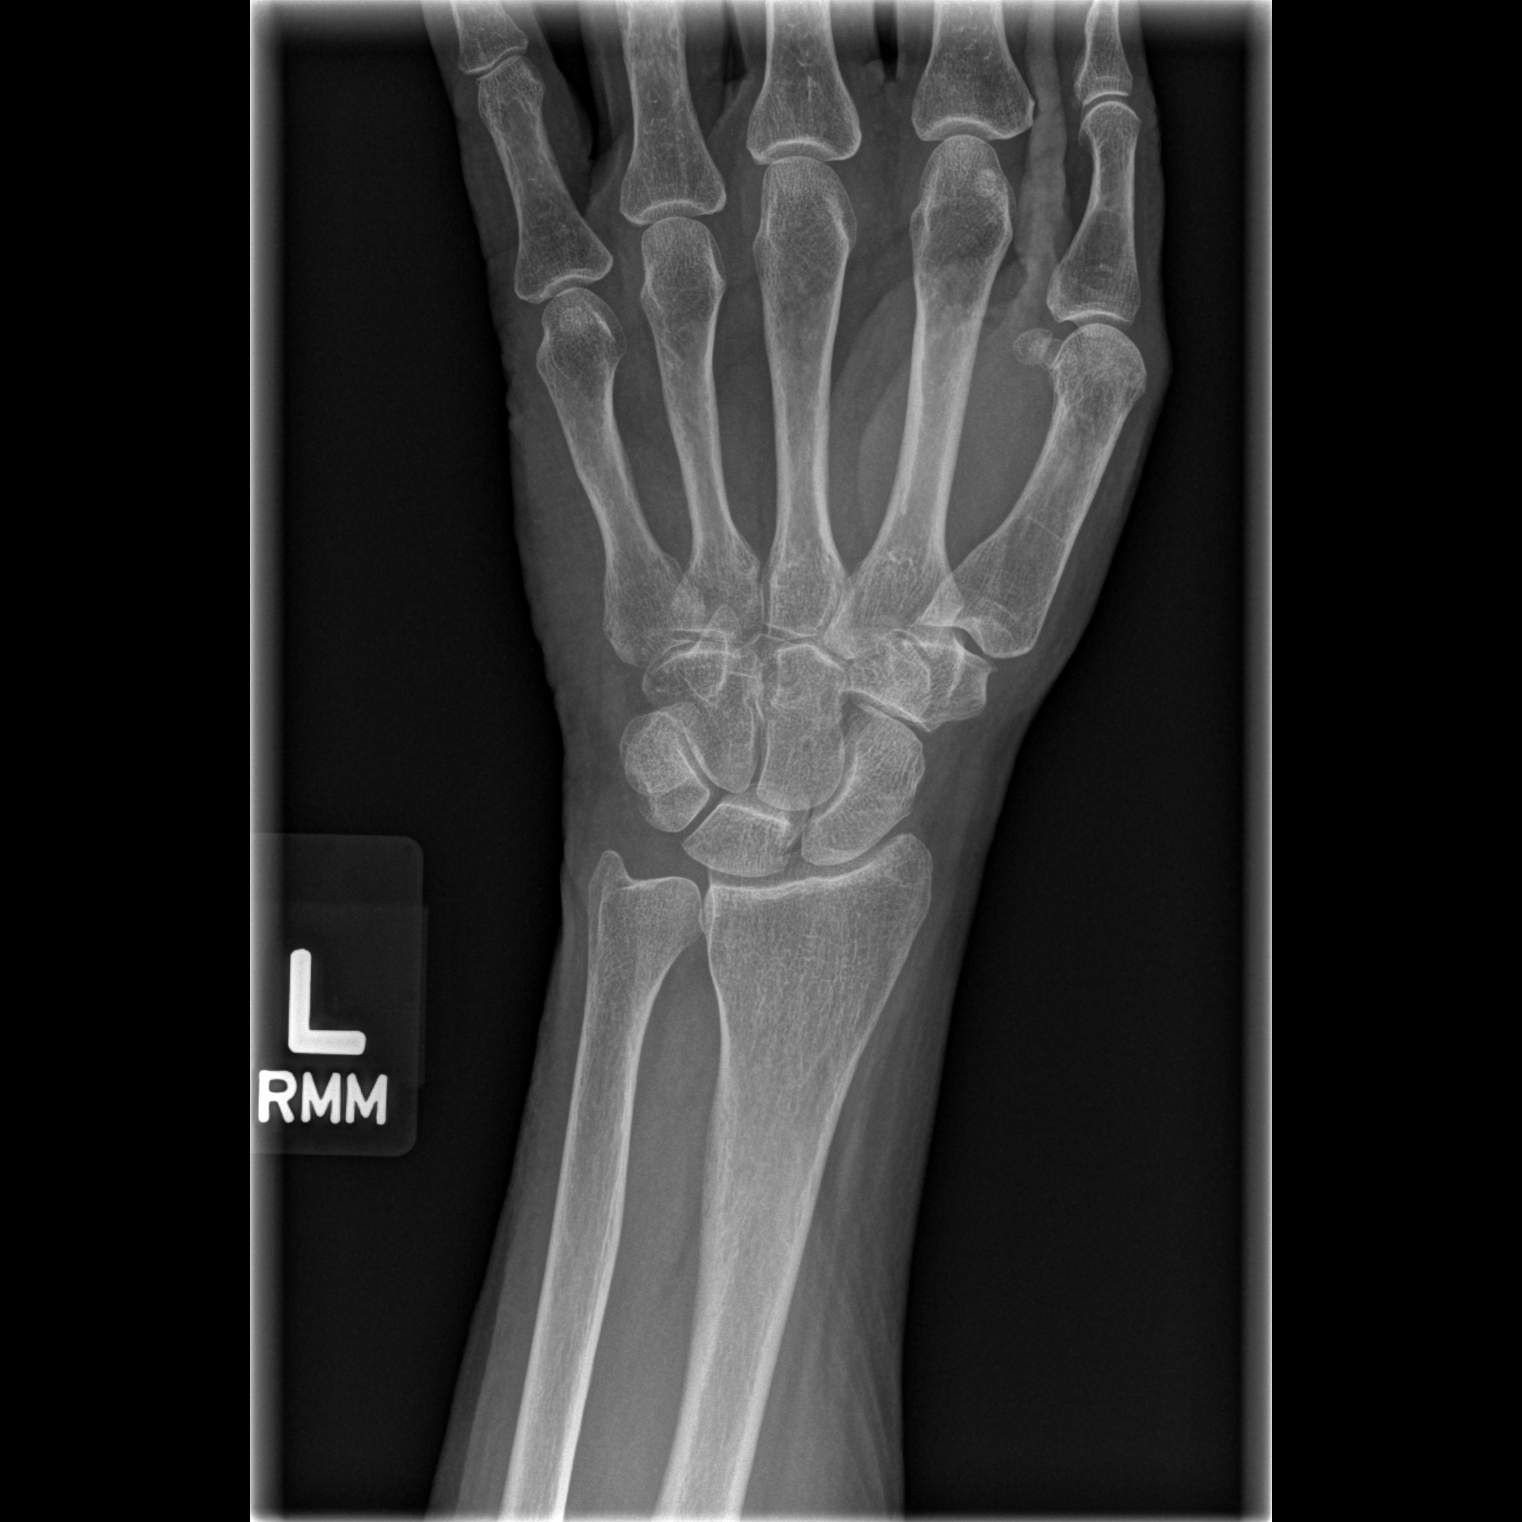

[x wrist lat left]
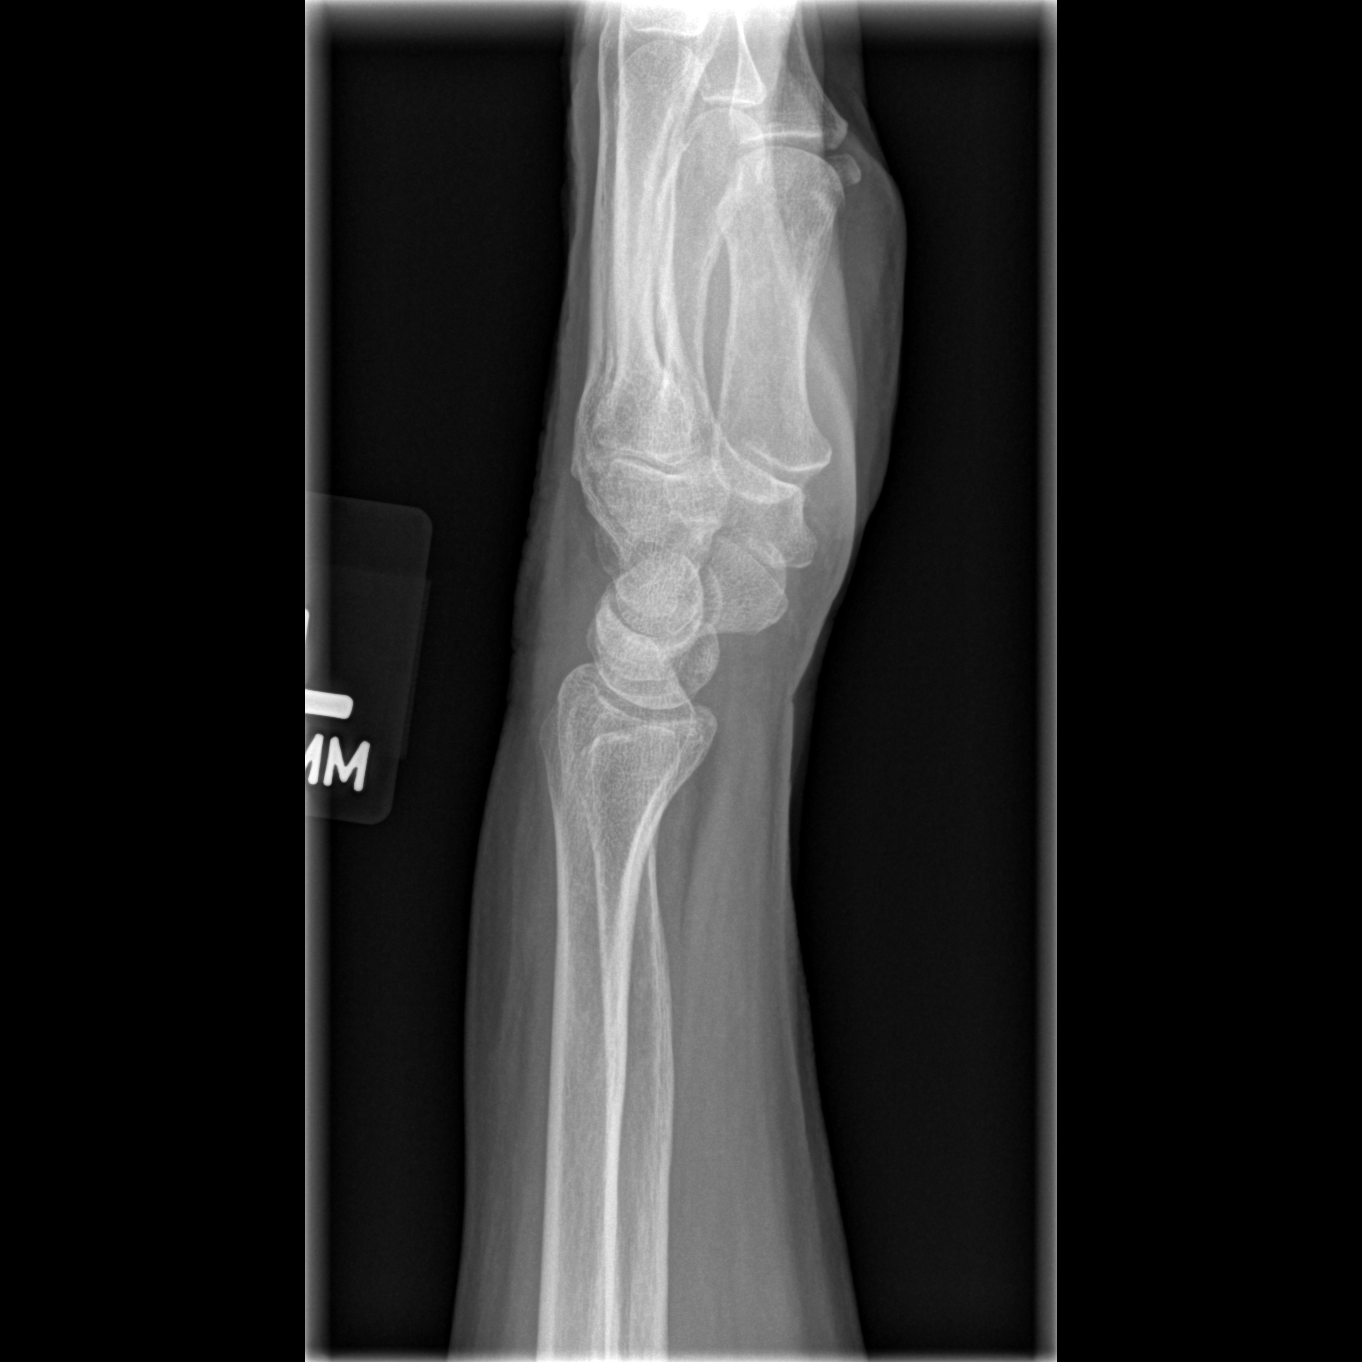

[2 of 2 positions shown; findings below may reference images not displayed]

FINDINGS: Mild-to-moderate triscaphe and thumb carpometacarpal joint space
narrowing. Neutral ulnar variance. No acute fracture is seen. No
dislocation.
IMPRESSION: Mild thumb carpometacarpal greater than triscaphe osteoarthritis. No
acute fracture is seen.

## 2024-05-23 MED ORDER — LOPERAMIDE HCL 2 MG PO CAPS: 2.0000 mg | ORAL_CAPSULE | Freq: Every day | ORAL | Status: DC | PRN

## 2024-05-23 MED ORDER — LOPERAMIDE HCL 2 MG PO CAPS: 2.0000 mg | ORAL_CAPSULE | Freq: Every day | ORAL | 0 refills | Status: AC | PRN

## 2024-05-23 NOTE — Progress Notes (Signed)
 Patient discharged, Important Message Letter mailed to patient.

## 2024-05-23 NOTE — Plan of Care (Signed)
   Problem: Elimination: Goal: Will not experience complications related to bowel motility Outcome: Progressing   Problem: Safety: Goal: Ability to remain free from injury will improve Outcome: Progressing

## 2024-05-23 NOTE — Discharge Summary (Addendum)
 " Physician Discharge Summary   Patient: Laura Gentry MRN: [id number] DOB: December 22, 1955  Admit date:     05/18/2024  Discharge date: 05/23/2024  Discharge Physician: Elidia Sieving Lanise Mergen   PCP: Rexanne Ingle, MD   Recommendations at discharge:    Patient will take as needed loperamide  for diarrhea, infectious process has been ruled out Follow up renal function and electrolytes in 7 days as outpatient Follow up with Dr Rexanne in 7 to 10 days   Discharge Diagnoses: Principal Problem:   Acute diarrhea Active Problems:   Hypotension   Coronary artery disease   AKI (acute kidney injury)   Iron deficiency anemia   Acquired hypothyroidism   Type 2 diabetes mellitus with hyperlipidemia (HCC)   History of depression   Paroxysmal SVT (supraventricular tachycardia)  Resolved Problems:   * No resolved hospital problems. [facility] Course: Laura Gentry was admitted to the hospital with the working diagnosis of diarrhea.   68 yo female with the past medical history of T2DM, hyperlipidemia, coronary artery disease, and hypothyroidism who presented with diarrhea and dyspnea.  Reported persistent symptoms for the last 7 days, with no fever or vomiting. More than 10 episodes of diarrhea per day.  Recent hospitalization for sepsis due to urinary tract  infection, 12/18 to 05/14/24, with ESBL E Coli, treated with 3 days of meropenem . Hospitalization complicated with diarrhea and SVT. On her initial physical examination her blood pressure was 115/68, HR 86, RR 20 and 02 saturation 97%  Lungs with no wheezing or rhonchi, heart with S1 and S2 present and regular, with no gallops or rubs, abdomen soft and non tender, not distended, no lower extremity edema.   Na 134, K 3,4 Cl 95 bicarbonate 26 glucose 148, bun 24 cr 1,23 AST 18 ALT 22 BNP 2,053  High sensitive troponin 31  Lactic acid 1,8  Wbc 18.6 hgb 11.5 plt 360  Urine analysis SG 1,012, protein negative, trace leukocytes and negative  hgb   Chest radiograph with hypoinflation, right rotation, positive cardiomegaly, bilateral hilar vascular congestion and small bilateral pleural effusions.   EKG 78 bpm, normal axis, normal intervals, sinus rhythm with left atrial enlargement, with no significant ST segment or T wave changes.   12/28 continue to have diarrhea  12/29 diarrhea has been persistent  12/30 diarrhea has resolved. Patient will follow up as outpatient   Assessment and Plan: * Acute diarrhea Patient with recent exposure to broad spectrum antibiotic therapy with meropenem  for 3 days Gastrointestinal infectious panel and C diff negative.  Leukocytosis resolved.   Initially patient was placed on oral vancomycin  for possible C diff, once tested negative this was discontinued.  She received symptomatic therapy with good toleration.  By 12/30 her diarrhea has resolved and patient having formed stools.   Hypotension History of dystonia with chronic hypotension Continue midodrine   Coronary artery disease Last cardiac cath 2025 June.  Showing single-vessel obstructive disease, first diagonal, 2 small for PCI. Continue home medication of Ranexa , statins,  High sensitive troponin elevation not consistent with acute coronary syndrome.   AKI (acute kidney injury) Hypokalemia and hypomagnesemia. Likely pre renal renal failure.   At the time of her discharge her renal function was stable with serum cr at 0,84 with K at 4.0 and serum bicarbonate at 25  Na 138 and Mg 2.2   Plan to follow up renal function and electrolytes as outpatient    Paroxysmal SVT (supraventricular tachycardia) Continue with metoprolol  succinate 12.5 mg daily  Recent  echocardiogram 04.2025 with preserved LV systolic function with EF 50 to 60%, possible basal antero lateral hypokinesis. Grade I diastolic dysfunction with impaired relaxation, RV systolic function preserved, LA with mild dilatation, no significant valvular disease.   For now  will ruled out congested heart failure    Acquired hypothyroidism Not on levothyroxine, her TSH on 12/19 was 3.9 Plan to follow up as outpatient   Type 2 diabetes mellitus with hyperlipidemia (HCC) Uncontrolled T2DM with hyperglycemia, with Hgb A1c 7.0 Patient was placed on insulin  sliding scale for glucose cover and monitoring during her hospitalization plus basal insulin   At discharge she will resume her home insulin  regimen, plus GLP 1 agonist.  Fasting glucose this am 154 mg/dl on the day of her discharge    Continue with statin   Iron deficiency anemia Continue oral iron supplementation  Discharge hgb is 11.7  Follow up as outpatient   History of depression Continue with sertraline       Consultants: none  Procedures performed: none   Disposition: Home Diet recommendation:  Cardiac and Carb modified diet DISCHARGE MEDICATION: Allergies as of 05/23/2024       Reactions   Amoxicillin Anaphylaxis, Shortness Of Breath   Bee Venom Shortness Of Breath   Penicillins Anaphylaxis, Swelling, Other (See Comments)   Remote reaction- Throat swelling - compromised airway Tolerates ceftriaxone    Pneumococcal Vaccines Other (See Comments)   Per patient due to Brain Aneurysm   Sulfa  Antibiotics Hives, Itching, Swelling   Bactrim  [sulfamethoxazole -trimethoprim ] Hives, Itching, Swelling, Other (See Comments)   Felt off balance   Caffeine Other (See Comments)   headache        Medication List     STOP taking these medications    nitrofurantoin  (macrocrystal-monohydrate) 100 MG capsule Commonly known as: MACROBID        TAKE these medications    acetaminophen  500 MG tablet Commonly known as: TYLENOL  Take 1 tablet (500 mg total) by mouth every 6 (six) hours as needed for mild pain or headache. What changed: how much to take   alendronate  70 MG tablet Commonly known as: FOSAMAX  Take 70 mg by mouth every Monday.   atorvastatin  40 MG tablet Commonly known as:  LIPITOR Take 1 tablet (40 mg total) by mouth daily. What changed: when to take this   cyanocobalamin  1000 MCG tablet Commonly known as: VITAMIN B12 Take 1,000 mcg by mouth daily.   FreeStyle Libre 3 Reader Devi 1 Application by Does not apply route.   gabapentin  300 MG capsule Commonly known as: NEURONTIN  Take 300 mg in AM, 300 mg at noon, 600 mg at bedtime What changed:  how much to take how to take this when to take this additional instructions   insulin  aspart 100 UNIT/ML injection Commonly known as: novoLOG  Inject 6-24 Units into the skin 3 (three) times daily before meals. Sliding scale:less than 100  Do not Take 100-140   = 6 units 141-180  =  10 units 181-220  =  12 units 221-260  =  14 units 261-300  =  16 units 301-340  =  18 units 341-380  =  20 units 381 > = 24 units   Lantus  SoloStar 100 UNIT/ML Solostar Pen Generic drug: insulin  glargine Inject 32 Units into the skin at bedtime. What changed:  how much to take when to take this additional instructions   loperamide  2 MG capsule Commonly known as: IMODIUM  Take 1 capsule (2 mg total) by mouth daily as needed for diarrhea or  loose stools.   metoprolol  succinate 25 MG 24 hr tablet Commonly known as: TOPROL -XL Take 0.5 tablets (12.5 mg total) by mouth daily.   midodrine  5 MG tablet Commonly known as: PROAMATINE  Take 1 tablet (5 mg total) by mouth 3 (three) times daily with meals. What changed: when to take this   Ozempic (0.25 or 0.5 MG/DOSE) 2 MG/3ML Sopn Generic drug: Semaglutide(0.25 or 0.5MG /DOS) Inject 0.5 mg into the skin every Thursday.   pantoprazole  40 MG tablet Commonly known as: PROTONIX  Take 40 mg by mouth daily.   POTASSIUM GLUCONATE PO Take 1 tablet by mouth daily.   ranolazine  500 MG 12 hr tablet Commonly known as: Ranexa  Take 1 tablet (500 mg total) by mouth 2 (two) times daily.   sertraline  25 MG tablet Commonly known as: ZOLOFT  Take 25 mg by mouth at bedtime.    thiamine  100 MG tablet Commonly known as: Vitamin B-1 Take 100 mg by mouth daily.   Vitamin D (Ergocalciferol) 1.25 MG (50000 UNIT) Caps capsule Commonly known as: DRISDOL Take 50,000 Units by mouth every 7 (seven) days.        Discharge Exam: Filed Weights   05/21/24 0418 05/22/24 0512 05/23/24 0447  Weight: 77.8 kg 75.6 kg 73.1 kg   BP 139/71 (BP Location: Right Arm)   Pulse 76   Temp 98.7 F (37.1 C) (Oral)   Resp 16   Ht 5' 7 (1.702 m)   Wt 73.1 kg Comment: bed weight  SpO2 96%   BMI 25.24 kg/m   Patient with no chest pain and no dyspnea, no nausea or vomiting, diarrhea has resolved and patient had a formed bowel movement,   Neurology awake and alert ENT with no pallor or icterus Cardiovascular with S1 and S2 present and regular with no gallops, rubs or murmurs Respiratory with no rales or wheezing, no rhonchi  Abdomen with no distention, soft and non tender No lower extremity edema   Condition at discharge: stable  The results of significant diagnostics from this hospitalization (including imaging, microbiology, ancillary and laboratory) are listed below for reference.   Imaging Studies: DG Chest Portable 1 View Result Date: 05/19/2024 EXAM: 1 VIEW(S) XRAY OF THE CHEST 05/19/2024 02:50:00 AM COMPARISON: 05/13/2024 CLINICAL HISTORY: sob FINDINGS: LUNGS AND PLEURA: Bibasilar airspace opacities. Chronic coarsened interstitial markings without pulmonary edema. Bilateral costophrenic angle blunting. No pneumothorax. HEART AND MEDIASTINUM: Cardiomegaly. Atherosclerotic plaque. BONES AND SOFT TISSUES: No acute osseous abnormality. IMPRESSION: 1. CHF with pulmonary edema and small bilateral pleural effusions, progressed from 12 / 20 / 25. Electronically signed by: Norman Gatlin MD 05/19/2024 02:56 AM EST RP Workstation: [id number]   DG Chest Port 1 View Result Date: 05/13/2024 EXAM: 1 VIEW XRAY OF THE CHEST 05/13/2024 04:55:00 AM COMPARISON: 05/11/2024 CLINICAL  HISTORY: Tachycardia FINDINGS: LUNGS AND PLEURA: Low lung volumes. Mild pulmonary edema. Mild diffuse pulmonary interstitial prominence. No focal pulmonary opacity. No pleural effusion. No pneumothorax. HEART AND MEDIASTINUM: No acute abnormality of the cardiac and mediastinal silhouettes. BONES AND SOFT TISSUES: No acute osseous abnormality. IMPRESSION: 1. Low lung volumes with mild pulmonary edema and mild diffuse pulmonary interstitial prominence. Electronically signed by: Evalene Coho MD 05/13/2024 05:21 AM EST RP Workstation: [id number]   CT ABDOMEN PELVIS W CONTRAST Addendum Date: 05/11/2024 ADDENDUM REPORT: 05/11/2024 14:27 ADDENDUM: Circumferential wall thickening of the urinary bladder, which may be due to underdistension. If there is concern for acute cystitis, correlation with urinalysis would be recommended. Electronically Signed   By: Rogelia Carlean HERO.D.  On: 05/11/2024 14:27   Result Date: 05/11/2024 CLINICAL DATA:  Abdominal pain, acute, nonlocalized EXAM: CT ABDOMEN AND PELVIS WITH CONTRAST TECHNIQUE: Multidetector CT imaging of the abdomen and pelvis was performed using the standard protocol following bolus administration of intravenous contrast. RADIATION DOSE REDUCTION: This exam was performed according to the departmental dose-optimization program which includes automated exposure control, adjustment of the mA and/or kV according to patient size and/or use of iterative reconstruction technique. CONTRAST:  60mL OMNIPAQUE  IOHEXOL  350 MG/ML SOLN COMPARISON:  05/09/2024, 01/20/2022 FINDINGS: Lower chest: No focal airspace consolidation or pleural effusion.Fibrolinear scarring and dependent atelectasis in the lung bases. Hepatobiliary: No mass.Cholecystectomy. No intrahepatic or extrahepatic biliary ductal dilation. The portal veins are patent. Pancreas: No mass or main ductal dilation. No peripancreatic inflammation or fluid collection. Spleen: Normal size. No mass. Adrenals/Urinary  Tract: No adrenal masses. Similar appearance of bilateral renal cysts. Punctate nonobstructive bilateral calyceal calculi. No hydronephrosis. Circumferential wall thickening of the urinary bladder. Stomach/Bowel: The stomach is decompressed without focal abnormality. No small bowel wall thickening or inflammation. No small bowel obstruction.The appendix was not visualized, possibly surgically absent. Vascular/Lymphatic: No aortic aneurysm. Diffuse aortoiliac atherosclerosis. No intraabdominal or pelvic lymphadenopathy. Reproductive: Hysterectomy. No concerning adnexal mass.No free pelvic fluid. Other: No pneumoperitoneum, ascites, or mesenteric inflammation. Musculoskeletal: No acute fracture or destructive lesion. Osteopenia. Mild degenerative disc disease at L4-L5. Multilevel thoracolumbar osteophytosis. Redemonstrated lag screws transfixing the left femoral neck. IMPRESSION: 1. No acute intra-abdominal or pelvic abnormality. 2. Nonobstructive bilateral nephrolithiasis. No hydronephrosis in either kidney. Aortic Atherosclerosis (ICD10-I70.0). Electronically Signed: By: Rogelia Myers M.D. On: 05/11/2024 14:21   DG Chest 1 View Result Date: 05/11/2024 CLINICAL DATA:  Shortness of breath EXAM: CHEST  1 VIEW COMPARISON:  September 12, 2023 FINDINGS: The heart size and mediastinal contours are within normal limits. Minimal bibasilar subsegmental atelectasis or scarring is noted. The visualized skeletal structures are unremarkable. IMPRESSION: Minimal bibasilar subsegmental atelectasis or scarring. Electronically Signed   By: Lynwood Landy Raddle M.D.   On: 05/11/2024 11:30   CT ABDOMEN PELVIS WO CONTRAST Result Date: 05/09/2024 CLINICAL DATA:  Acute right flank pain EXAM: CT ABDOMEN AND PELVIS WITHOUT CONTRAST TECHNIQUE: Multidetector CT imaging of the abdomen and pelvis was performed following the standard protocol without IV contrast. RADIATION DOSE REDUCTION: This exam was performed according to the departmental  dose-optimization program which includes automated exposure control, adjustment of the mA and/or kV according to patient size and/or use of iterative reconstruction technique. COMPARISON:  September 20, 2021. FINDINGS: Lower chest: No acute abnormality. Hepatobiliary: Status post cholecystectomy. No biliary dilatation. No hepatic focal abnormality seen on these unenhanced images. Pancreas: Unremarkable. No pancreatic ductal dilatation or surrounding inflammatory changes. Spleen: Normal in size without focal abnormality. Adrenals/Urinary Tract: Adrenal glands appear normal. Bilateral nonobstructive nephrolithiasis. Stable left renal cyst. No hydronephrosis or renal obstruction is noted. Urinary bladder is decompressed. Stomach/Bowel: No evidence of bowel obstruction or inflammation. Status post appendectomy Vascular/Lymphatic: Aortic atherosclerosis. No enlarged abdominal or pelvic lymph nodes. Reproductive: Status post hysterectomy. No adnexal masses. Other: No abdominal wall hernia or abnormality. No abdominopelvic ascites. Musculoskeletal: No acute or significant osseous findings. IMPRESSION: 1. Bilateral nonobstructive nephrolithiasis. No hydronephrosis or renal obstruction is noted. 2. No acute abnormality seen in the abdomen or pelvis. 3. Aortic atherosclerosis. Aortic Atherosclerosis (ICD10-I70.0). Electronically Signed   By: Lynwood Landy Raddle M.D.   On: 05/09/2024 17:11    Microbiology: Results for orders placed or performed during the hospital encounter of 05/18/24  Gastrointestinal Panel by  PCR , Stool     Status: None   Collection Time: 05/19/24  1:14 AM   Specimen: STOOL  Result Value Ref Range Status   Campylobacter species NOT DETECTED NOT DETECTED Final   Plesimonas shigelloides NOT DETECTED NOT DETECTED Final   Salmonella species NOT DETECTED NOT DETECTED Final   Yersinia enterocolitica NOT DETECTED NOT DETECTED Final   Vibrio species NOT DETECTED NOT DETECTED Final   Vibrio cholerae NOT  DETECTED NOT DETECTED Final   Enteroaggregative E coli (EAEC) NOT DETECTED NOT DETECTED Final   Enteropathogenic E coli (EPEC) NOT DETECTED NOT DETECTED Final   Enterotoxigenic E coli (ETEC) NOT DETECTED NOT DETECTED Final   Shiga like toxin producing E coli (STEC) NOT DETECTED NOT DETECTED Final   Shigella/Enteroinvasive E coli (EIEC) NOT DETECTED NOT DETECTED Final   Cryptosporidium NOT DETECTED NOT DETECTED Final   Cyclospora cayetanensis NOT DETECTED NOT DETECTED Final   Entamoeba histolytica NOT DETECTED NOT DETECTED Final   Giardia lamblia NOT DETECTED NOT DETECTED Final   Adenovirus F40/41 NOT DETECTED NOT DETECTED Final   Astrovirus NOT DETECTED NOT DETECTED Final   Norovirus GI/GII NOT DETECTED NOT DETECTED Final   Rotavirus A NOT DETECTED NOT DETECTED Final   Sapovirus (I, II, IV, and V) NOT DETECTED NOT DETECTED Final    Comment: Performed at [facility], [address] [place], [place] [address]  Blood Culture (routine x 2)     Status: None (Preliminary result)   Collection Time: 05/19/24  1:20 AM   Specimen: BLOOD  Result Value Ref Range Status   Specimen Description BLOOD LEFT ANTECUBITAL  Final   Special Requests   Final    BOTTLES DRAWN AEROBIC AND ANAEROBIC Blood Culture adequate volume   Culture   Final    NO GROWTH 4 DAYS Performed at [facility], [address] [place], [place] [address]    Report Status PENDING  Incomplete  Blood Culture (routine x 2)     Status: None (Preliminary result)   Collection Time: 05/19/24  1:31 AM   Specimen: BLOOD  Result Value Ref Range Status   Specimen Description BLOOD RIGHT ANTECUBITAL  Final   Special Requests   Final    BOTTLES DRAWN AEROBIC ONLY Blood Culture results may not be optimal due to an inadequate volume of blood received in culture bottles   Culture   Final    NO GROWTH 4 DAYS Performed at [facility], [address], [place], [place] [address]    Report Status PENDING  Incomplete   Urine Culture     Status: None   Collection Time: 05/19/24  3:39 AM   Specimen: Urine, Clean Catch  Result Value Ref Range Status   Specimen Description URINE, CLEAN CATCH  Final   Special Requests NONE  Final   Culture   Final    NO GROWTH Performed at [facility], [address], [place], [place] [address]    Report Status 05/20/2024 FINAL  Final  MRSA Next Gen by PCR, Nasal     Status: None   Collection Time: 05/19/24 12:42 PM   Specimen: Nasal Mucosa; Nasal Swab  Result Value Ref Range Status   MRSA by PCR Next Gen NOT DETECTED NOT DETECTED Final    Comment: (NOTE) The GeneXpert MRSA Assay (FDA approved for NASAL specimens only), is one component of a comprehensive MRSA colonization surveillance program. It is not intended to diagnose MRSA infection nor to guide  or monitor treatment for MRSA infections. Test performance is not FDA approved in patients less than 65 years old. Performed at [facility], [address], [place], [place] [address]   C Difficile Quick Screen w PCR reflex     Status: None   Collection Time: 05/20/24 10:09 AM   Specimen: STOOL  Result Value Ref Range Status   C Diff antigen NEGATIVE NEGATIVE Final   C Diff toxin NEGATIVE NEGATIVE Final   C Diff interpretation No C. difficile detected.  Final    Comment: Performed at [facility], [address] [place], [place] [address]    Labs: CBC: Recent Labs  Lab 05/18/24 2233 05/20/24 0250 05/21/24 0132 05/22/24 0132  WBC 18.6* 8.4 9.0 10.0  HGB 11.5* 10.5* 11.1* 11.7*  HCT 35.2* 32.2* 34.1* 35.9*  MCV 78.9* 79.7* 78.8* 78.0*  PLT 360 338 403* 418*   Basic Metabolic Panel: Recent Labs  Lab 05/18/24 2233 05/19/24 0244 05/20/24 0250 05/21/24 0132 05/22/24 0132 05/23/24 0454  NA 134*  --  139 136 138 138  K 3.4*  --  3.3* 3.7 3.5 4.0  CL 95*  --  104 103 103 103  CO2 26  --  28 24 24 25   GLUCOSE 148*  --  200* 161* 124* 154*  BUN 24*  --  19 15 8 9   CREATININE  1.23*  --  0.80 0.78 0.73 0.84  CALCIUM  8.4*  --  8.2* 8.5* 8.4* 8.7*  MG  --  1.4*  --   --  1.4* 2.2  PHOS  --  3.1  --   --   --   --    Liver Function Tests: Recent Labs  Lab 05/18/24 2233  AST 18  ALT 22  ALKPHOS 104  BILITOT 1.2  PROT 6.5  ALBUMIN 3.3*   CBG: Recent Labs  Lab 05/22/24 1129 05/22/24 1617 05/22/24 2226 05/23/24 0641 05/23/24 0805  GLUCAP 163* 199* 143* 152* 265*    Discharge time spent: greater than 30 minutes.  Signed: Elidia Toribio Furnace, MD [facility] 05/23/2024 "

## 2024-05-23 NOTE — Care Management Important Message (Signed)
 Important Message  Patient Details  Name: Laura Gentry MRN: [id number] Date of Birth: [birth year]   Important Message Given:  No     [name] 05/23/2024, 2:09 PM

## 2024-05-23 NOTE — TOC Transition Note (Signed)
 Transition of Care [facility]) - Discharge Note   Patient Details  Name: Laura Gentry MRN: [id number] Date of Birth: August 12, 1955  Transition of Care [facility] CM/SW Contact:  Rosalva Jon Bloch, RN Phone Number: 05/23/2024, 10:47 AM   Clinical Narrative:    Patient will DC to: home Anticipated DC date: 05/23/2024 Family notified:yes Transport by: car  Readmitted with diarrhea. Pt from home with sister. PTA independent with ADL'S Per MD patient ready for DC today. RN, patient, and patient's sister aware of DC. Post hospital f/u noted on AVS and reinforced with pt. Pt without RX med concerns  or transportation issues.  RNCM will sign off for now as intervention is no longer needed. Please consult us  again if new needs arise.    Final next level of care: Home/Self Care Barriers to Discharge: No Barriers Identified   Patient Goals and CMS Choice            Discharge Placement                       Discharge Plan and Services Additional resources added to the After Visit Summary for                                       Social Drivers of Health (SDOH) Interventions SDOH Screenings   Food Insecurity: No Food Insecurity (05/19/2024)  Housing: Unknown (05/19/2024)  Transportation Needs: No Transportation Needs (05/19/2024)  Utilities: Not At Risk (05/19/2024)  Social Connections: Socially Integrated (05/19/2024)  Tobacco Use: Medium Risk (05/18/2024)     Readmission Risk Interventions    05/23/2024   10:42 AM 01/22/2022   11:32 AM 11/24/2021   11:10 AM  Readmission Risk Prevention Plan  Transportation Screening Complete Complete Complete  PCP or Specialist Appt within 5-7 Days Complete    Home Care Screening Complete  Complete  Medication Review (RN CM) Complete  Complete  HRI or Home Care Consult  Complete   Social Work Consult for Recovery Care Planning/Counseling  Complete   Palliative Care Screening  Not Applicable   Medication Review [employer] Conservator/restorer)  Complete

## 2024-05-24 LAB — CULTURE, BLOOD (ROUTINE X 2)
Culture: NO GROWTH
Culture: NO GROWTH
Special Requests: ADEQUATE

## 2024-05-26 IMAGING — DX DG HIP (WITH OR WITHOUT PELVIS) 2-3V*L*
3 series · 3 of 3 positions shown · non-contrast
Comparison: November 04, 2021

CLINICAL DATA: Postoperative hip pinning

EXAM:
DG HIP (WITH OR WITHOUT PELVIS) 2-3V LEFT

[pelvis ap]
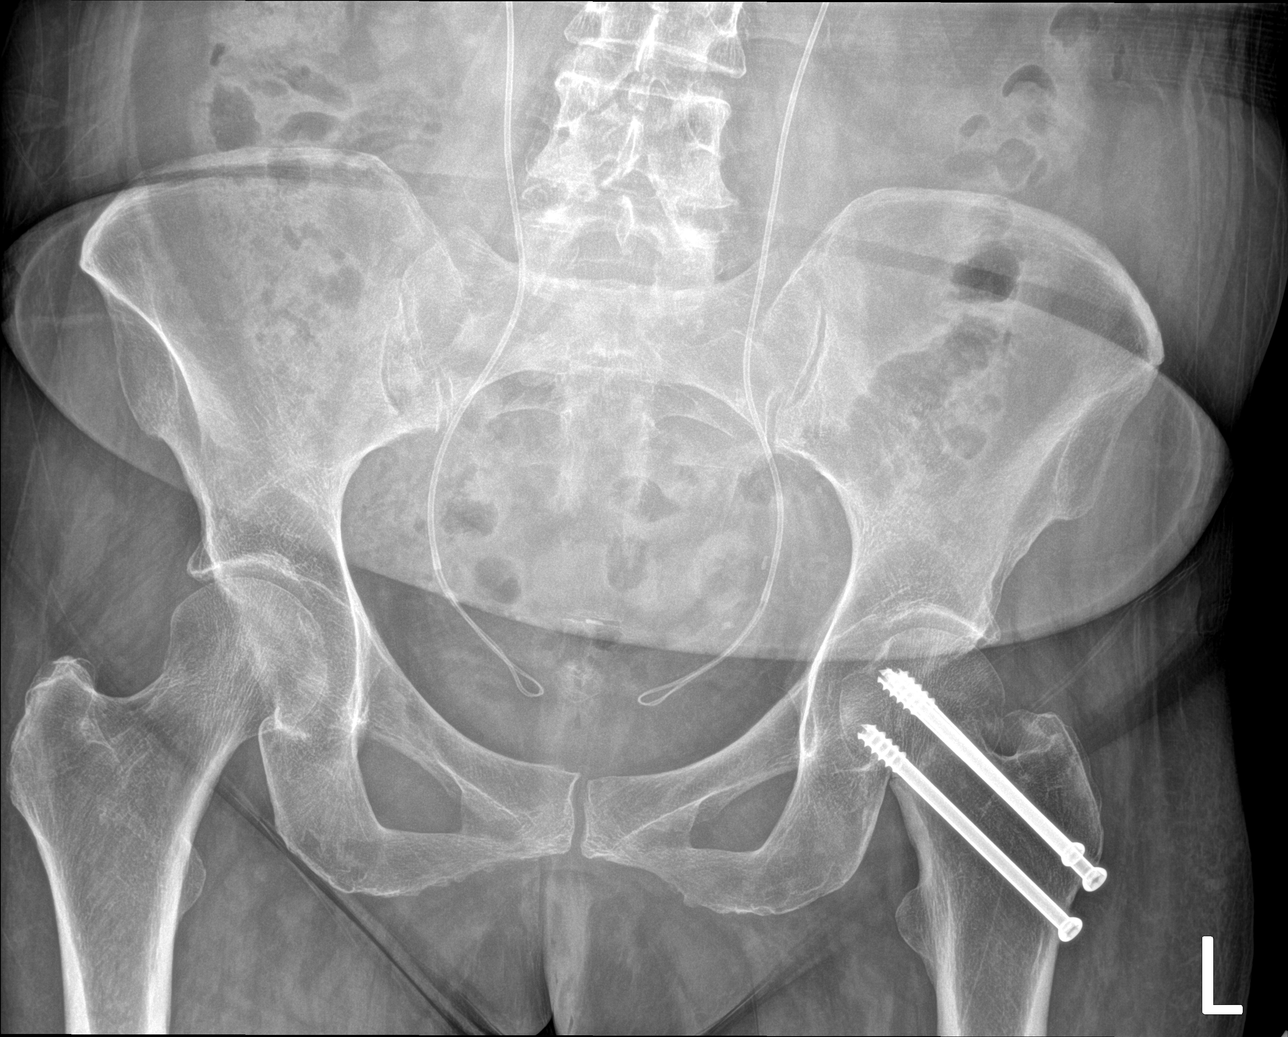

[hip ap]
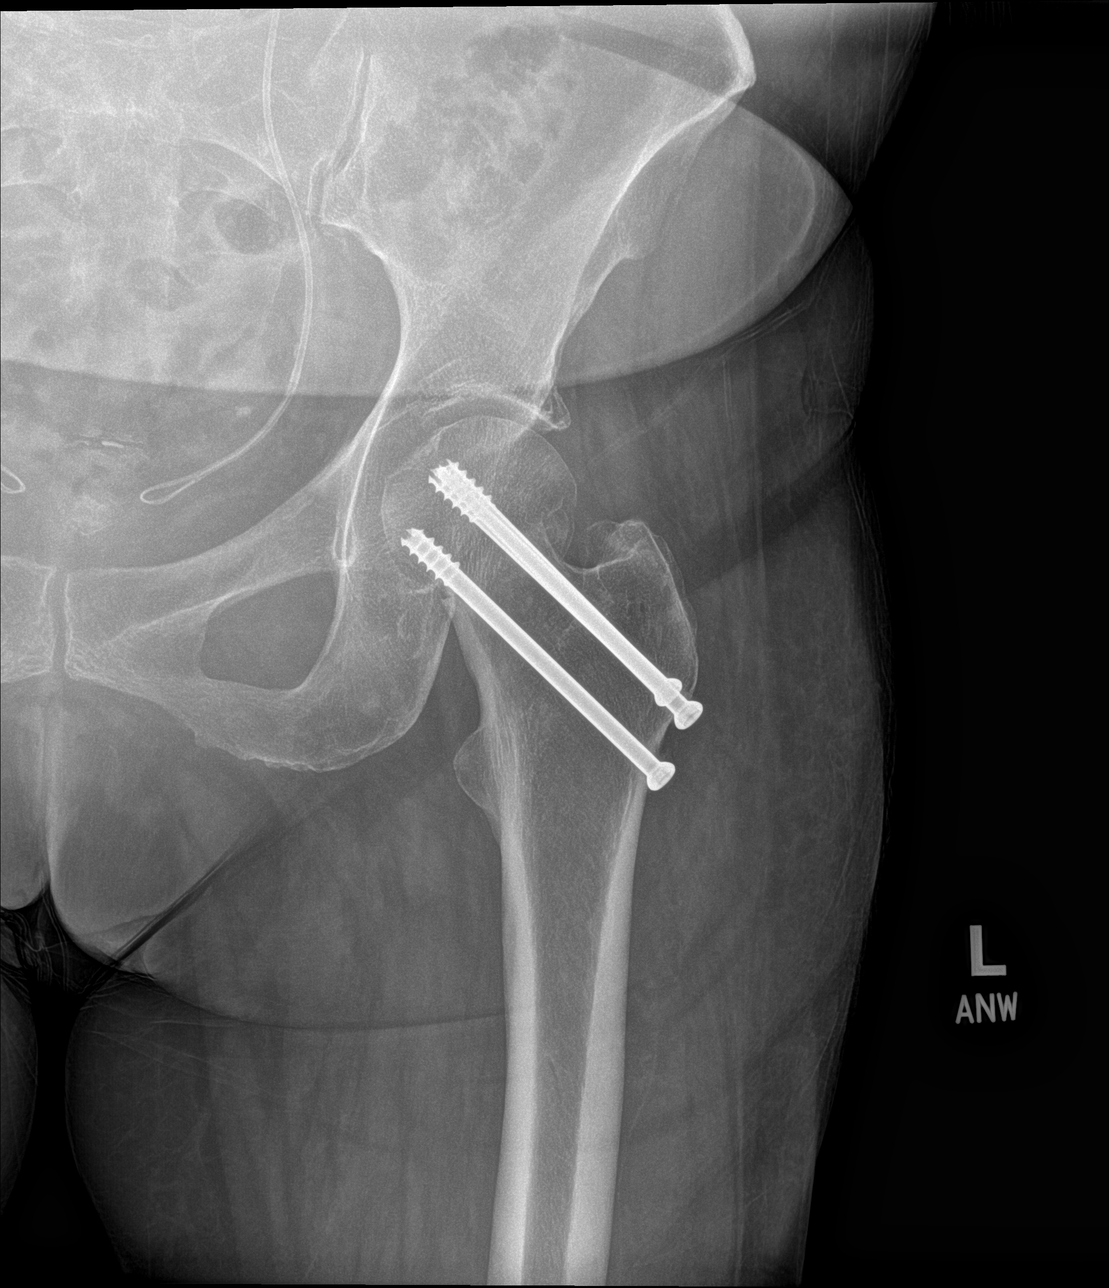

[hip frog leg]
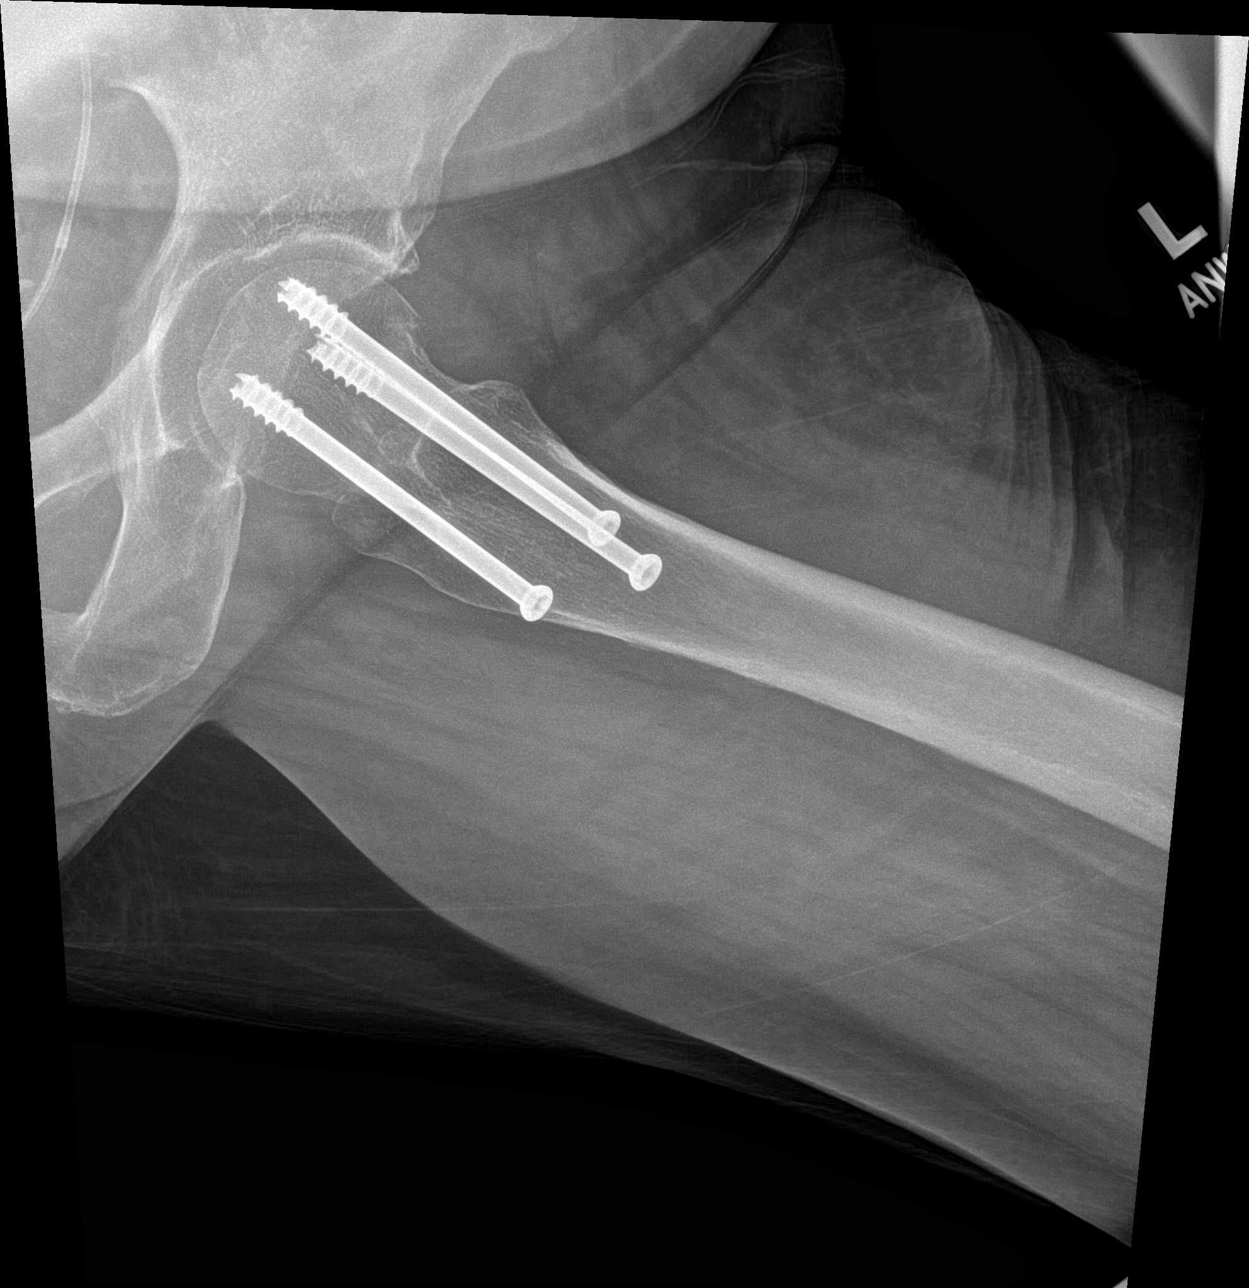

[3 of 3 positions shown; findings below may reference images not displayed]

FINDINGS: There is no evidence of hip fracture or dislocation. Pins are
projected in the proximal left femur without malalignment. Stents in
bilateral ureteral system are noted.
IMPRESSION: Pins are projected in the proximal left femur without malalignment.

## 2024-05-30 NOTE — Progress Notes (Unsigned)
 " Cardiology Office Note:    Date:  05/31/2024   ID:  Laura Gentry, DOB 1955/07/14, MRN [id number]  PCP:  Rexanne Ingle, MD   [facility] Providers Cardiologist:  None     Referring MD: Rexanne Ingle, MD   Chief Complaint  Patient presents with   Coronary Artery Disease   Palpitations    History of Present Illness:    Laura Gentry is a 69 y.o. female seen for follow up SVT. She was recently admitted with acute GI illness related to Norovirus. Went into SVT that broke with IV adenosine . Troponin increased to 118. Echo done showing normal overall LV function. There was some question of basal anterior HK but in my review it was a difficult area to assess due to Echo drop out and difficulty seeing the endocardial border. History of remote cerebral aneurysm clipping. History of orthostatic hypotension on midodrine . Was taking propranolol  chronically for tremor but this was discontinued.   We performed event monitor showing no significant arrhythmia. CTA suggested significant LAD stenosis but on cath only obstruction in small diagonal branch. Clinically improved on Ranexa .   She was admitted in Dec with  sepsis related to UTI. She did have SVT that responded to IV adenosine . Was started on Toprol  XL 12.5 mg daily. Echo was normal. Outpatient monitor recommended but only wore 2 days before she was readmitted. SABRA She was readmitted at end of Dec with severe diarrhea. Treated with loperimide and IVF.   She has had no further tachycardia. Diarrhea has resolved. No fever.    Past Medical History:  Diagnosis Date   Brain aneurysm 03/19/2013   Cataracts, bilateral    Cerebral aneurysm    Colon polyps    Diabetes (HCC) 2013   High cholesterol    History of kidney stones    Insomnia    Lumbosacral radiculopathy    Osteoporosis    Skin cancer    squamous   Thyroid  disease    Tremor     Past Surgical History:  Procedure Laterality Date   APPENDECTOMY     BRAIN  SURGERY  03/19/2013   Sugita aneyrsum clip can have MRI up to 4T docs scanned in    BREAST BIOPSY Right    CHOLECYSTECTOMY  2013   CYSTOSCOPY WITH RETROGRADE PYELOGRAM, URETEROSCOPY AND STENT PLACEMENT Bilateral 10/29/2021   Procedure: CYSTOSCOPY WITH RETROGRADE PYELOGRAM, URETEROSCOPY AND STENT PLACEMENT;  Surgeon: Alvaro Hummer, MD;  Location: [facility];  Service: Urology;  Laterality: Bilateral;   CYSTOSCOPY WITH RETROGRADE PYELOGRAM, URETEROSCOPY AND STENT PLACEMENT Bilateral 11/19/2021   Procedure: CYSTOSCOPY WITH RETROGRADE PYELOGRAM, URETEROSCOPY AND STENT EXCHANGE, BASKET STONE REMOVAL;  Surgeon: Alvaro Hummer, MD;  Location: [facility];  Service: Urology;  Laterality: Bilateral;  75 MINS   HIP PINNING,CANNULATED Left 11/04/2021   Procedure: CANNULATED HIP PINNING;  Surgeon: Genelle Standing, MD;  Location: [facility];  Service: Orthopedics;  Laterality: Left;   HOLMIUM LASER APPLICATION Bilateral 11/19/2021   Procedure: HOLMIUM LASER APPLICATION;  Surgeon: Alvaro Hummer, MD;  Location: [facility];  Service: Urology;  Laterality: Bilateral;   INNER EAR SURGERY     Lost Hearing   LEFT HEART CATH AND CORONARY ANGIOGRAPHY N/A 11/09/2023   Procedure: LEFT HEART CATH AND CORONARY ANGIOGRAPHY;  Surgeon: Earle Burson M, MD;  Location: [facility] INVASIVE CV LAB;  Service: Cardiovascular;  Laterality: N/A;   TONSILLECTOMY AND ADENOIDECTOMY     VAGINAL HYSTERECTOMY     At age 26 due to growth  Current Medications: Current Meds  Medication Sig   acetaminophen  (TYLENOL ) 500 MG tablet Take 1 tablet (500 mg total) by mouth every 6 (six) hours as needed for mild pain or headache.   alendronate  (FOSAMAX ) 70 MG tablet Take 70 mg by mouth every Monday.   atorvastatin  (LIPITOR) 40 MG tablet Take 1 tablet (40 mg total) by mouth daily.   Continuous Glucose Receiver (FREESTYLE LIBRE 3 READER) DEVI 1 Application by Does not apply route.   gabapentin  (NEURONTIN ) 300 MG capsule Take 300 mg in AM, 300 mg at noon, 600 mg  at bedtime   insulin  aspart (NOVOLOG ) 100 UNIT/ML injection Inject 6-24 Units into the skin 3 (three) times daily before meals. Sliding scale:less than 100  Do not Take 100-140   = 6 units 141-180  =  10 units 181-220  =  12 units 221-260  =  14 units 261-300  =  16 units 301-340  =  18 units 341-380  =  20 units 381 > = 24 units   insulin  glargine (LANTUS  SOLOSTAR) 100 UNIT/ML Solostar Pen Inject 32 Units into the skin at bedtime.   loperamide  (IMODIUM ) 2 MG capsule Take 1 capsule (2 mg total) by mouth daily as needed for diarrhea or loose stools.   metoprolol  succinate (TOPROL -XL) 25 MG 24 hr tablet Take 0.5 tablets (12.5 mg total) by mouth daily.   midodrine  (PROAMATINE ) 5 MG tablet Take 1 tablet (5 mg total) by mouth 3 (three) times daily with meals.   pantoprazole  (PROTONIX ) 40 MG tablet Take 40 mg by mouth daily.   POTASSIUM GLUCONATE PO Take 1 tablet by mouth daily.   ranolazine  (RANEXA ) 500 MG 12 hr tablet Take 1 tablet (500 mg total) by mouth 2 (two) times daily.   Semaglutide,0.25 or 0.5MG /DOS, (OZEMPIC, 0.25 OR 0.5 MG/DOSE,) 2 MG/3ML SOPN Inject 0.5 mg into the skin every Thursday.   sertraline  (ZOLOFT ) 25 MG tablet Take 25 mg by mouth at bedtime.   thiamine  (VITAMIN B-1) 100 MG tablet Take 100 mg by mouth daily.   vitamin B-12 (CYANOCOBALAMIN ) 1000 MCG tablet Take 1,000 mcg by mouth daily.   Vitamin D, Ergocalciferol, (DRISDOL) 1.25 MG (50000 UNIT) CAPS capsule Take 50,000 Units by mouth every 7 (seven) days.     Allergies:   Amoxicillin, Bee venom, Penicillins, Pneumococcal vaccines, Sulfa  antibiotics, Bactrim  [sulfamethoxazole -trimethoprim ], and Caffeine   Social History   Socioeconomic History   Marital status: Divorced    Spouse name: Not on file   Number of children: 0   Years of education: Not on file   Highest education level: Some college, no degree  Occupational History   Occupation: Catering Manager: [employer]    Comment: disabled   Tobacco Use   Smoking status: Former    Current packs/day: 0.00    Average packs/day: 0.5 packs/day for 40.0 years (20.0 ttl pk-yrs)    Types: Cigarettes    Start date: 05/26/1971    Quit date: 05/26/2011    Years since quitting: 13.0   Smokeless tobacco: Never  Vaping Use   Vaping status: Never Used  Substance and Sexual Activity   Alcohol use: No    Alcohol/week: 0.0 standard drinks of alcohol   Drug use: No   Sexual activity: Never  Other Topics Concern   Not on file  Social History Narrative   09/08/21 her mom lives with her   Caffeine-tea   Social Drivers of Health   Tobacco Use: Medium  Risk (05/31/2024)   Patient History    Smoking Tobacco Use: Former    Smokeless Tobacco Use: Never    Passive Exposure: Not on Actuary Strain: Not on file  Food Insecurity: No Food Insecurity (05/19/2024)   Epic    Worried About Programme Researcher, Broadcasting/film/video in the Last Year: Never true    Ran Out of Food in the Last Year: Never true  Transportation Needs: No Transportation Needs (05/19/2024)   Epic    Lack of Transportation (Medical): No    Lack of Transportation (Non-Medical): No  Physical Activity: Not on file  Stress: Not on file  Social Connections: Socially Integrated (05/19/2024)   Social Connection and Isolation Panel    Frequency of Communication with Friends and Family: Twice a week    Frequency of Social Gatherings with Friends and Family: Twice a week    Attends Religious Services: 1 to 4 times per year    Active Member of [employer] or Organizations: No    Attends Engineer, Structural: More than 4 times per year    Marital Status: Married  Depression (PHQ2-9): Not on file  Alcohol Screen: Not on file  Housing: Unknown (05/19/2024)   Epic    Unable to Pay for Housing in the Last Year: Patient declined    Number of Times Moved in the Last Year: 0    Homeless in the Last Year: No  Utilities: Not At Risk (05/19/2024)   Epic    Threatened with loss of  utilities: No  Health Literacy: Not on file     Family History: The patient's family history includes Allergies in her mother; Asthma in her mother; Breast cancer in her maternal aunt, maternal aunt, and paternal aunt; Diabetes in her maternal grandfather and mother; Emphysema in her maternal aunt; Hypertension in her maternal grandfather; Lung cancer in her father; Pancreatic cancer in her brother; Throat cancer in her father.  ROS:   Please see the history of present illness.     All other systems reviewed and are negative.  EKGs/Labs/Other Studies Reviewed:    The following studies were reviewed today: Echo 09/13/23: IMPRESSIONS     1. Left ventricular ejection fraction, by estimation, is 55 to 60%. The  left ventricle has normal function. The left ventricle demonstrates  regional wall motion abnormalities with possible basal anterolateral  hypokinesis. Left ventricular diastolic  parameters are consistent with Grade I diastolic dysfunction (impaired  relaxation).   2. Right ventricular systolic function is normal. The right ventricular  size is normal. Tricuspid regurgitation signal is inadequate for assessing  PA pressure.   3. Left atrial size was mildly dilated.   4. The mitral valve is normal in structure. No evidence of mitral valve  regurgitation. No evidence of mitral stenosis.   5. The aortic valve is tricuspid. There is mild calcification of the  aortic valve. Aortic valve regurgitation is not visualized. No aortic  stenosis is present.   6. The inferior vena cava is normal in size with greater than 50%  respiratory variability, suggesting right atrial pressure of 3 mmHg.       Coronary CTA 11/01/23: IMPRESSION: 1. Coronary calcium  score of 271. This was 88th percentile for age-, sex, and race-matched controls.   2. Total plaque volume 473 mm3 which is 74th percentile for age- and sex-matched controls (calcified plaque 41 mm3; non-calcified plaque 432 mm3). TPV is  severe.   3. Normal coronary origin with right dominance.  4. There is moderate (50-69%) mixed plaque in the proximal and mid LAD. There is mild (25-49%) plaque in OM1. CAD-RADS 3.   5. Will send for FFR-CT.   6.  Aortic atherosclerosis 1. Left Main: FFRct 0.99   2. LAD: FFRct 0.96 proximal, 0.8 mid, 0.74 distal   3. LCX: FFRct 0.98 proximal, 0.94 mid.  OM1 FFRct 0.87.   4. RCA: FFRct 0.96 proximal, 0.91 distal   IMPRESSION: 1.  FFRct findings are concerning for ischemia in the mid LAD.   2.  Consider cardiac catheterization.  Event monitor 11/03/23:  Study Highlights      Normal sinus rhythm   2 runs of SVT longest 7 beats   Rare PVC     Patch Wear Time:  10 days and 22 hours (2025-05-24T13:55:51-0400 to 2025-06-04T12:05:53-0400)   Patient had a min HR of 64 bpm, max HR of 146 bpm, and avg HR of 87 bpm. Predominant underlying rhythm was Sinus Rhythm. 2 Supraventricular Tachycardia runs occurred, the run with the fastest interval lasting 7 beats with a max rate of 146 bpm (avg 123  bpm); the run with the fastest interval was also the longest. Isolated SVEs were rare (<1.0%), SVE Couplets were rare (<1.0%), and SVE Triplets were rare (<1.0%). Isolated VEs were rare (<1.0%), VE Couplets were rare (<1.0%), and no VE Triplets were  present. Ventricular Bigeminy and Trigeminy were present. Inverted QRS complexes possibly due to inverted placement of device.   Cardiac cath: 11/09/23:  LEFT HEART CATH AND CORONARY ANGIOGRAPHY   Conclusion      Prox RCA lesion is 30% stenosed.   Mid RCA lesion is 30% stenosed.   Prox LAD to Mid LAD lesion is 40% stenosed.   1st Diag lesion is 85% stenosed.   LV end diastolic pressure is normal.   Single vessel obstructive CAD involving the first diagonal. This is too small for PCI Normal LVEDP   Plan: medical management. Given autonomic dysfunction with low BP will start Ranexa  500 mg bid. Risk factor modification.     Recent  Labs: 05/12/2024: TSH 3.980 05/18/2024: ALT 22 05/20/2024: Pro Brain Natriuretic Peptide 1,162.0 05/22/2024: Hemoglobin 11.7; Platelets 418 05/23/2024: BUN 9; Creatinine, Ser 0.84; Magnesium  2.2; Potassium 4.0; Sodium 138  Recent Lipid Panel    Component Value Date/Time   CHOL 158 10/29/2021 1008   CHOL 192 01/31/2018 1449   TRIG 174 (H) 10/29/2021 1008   HDL 29 (L) 10/29/2021 1008   HDL 26 (L) 01/31/2018 1449   CHOLHDL 5.4 10/29/2021 1008   VLDL 35 10/29/2021 1008   LDLCALC 94 10/29/2021 1008   LDLCALC 134 (H) 01/31/2018 1449   LDLDIRECT 120.0 09/26/2015 1226     Risk Assessment/Calculations:                Physical Exam:    VS:  BP 110/60 (BP Location: Left Arm, Patient Position: Sitting, Cuff Size: Normal)   Pulse 82   Ht 5' 7 (1.702 m)   Wt 169 lb (76.7 kg)   SpO2 96%   BMI 26.47 kg/m     Wt Readings from Last 3 Encounters:  05/31/24 169 lb (76.7 kg)  05/23/24 161 lb 2.5 oz (73.1 kg)  05/12/24 173 lb 4.5 oz (78.6 kg)     GEN:  Well nourished, well developed in no acute distress HEENT: Normal NECK: No JVD; No carotid bruits LYMPHATICS: No lymphadenopathy CARDIAC: RRR, no murmurs, rubs, gallops RESPIRATORY:  Clear to auscultation without rales, wheezing or rhonchi  ABDOMEN: Soft, non-tender, non-distended MUSCULOSKELETAL:  No edema; No deformity  SKIN: Warm and dry NEUROLOGIC:  Alert and oriented x 3 PSYCHIATRIC:  Normal affect   ASSESSMENT:    1. SVT (supraventricular tachycardia)   2. CAD in native artery      PLAN:    In order of problems listed above:  History of CAD. Coronary CTA suggested obstructive CAD. Subsequent cardiac cath demonstrated only significant stenosis in a small diagonal branch. Medical therapy recommended. She was started on Ranexa  due to history of hypotension. On follow up her chest pain has resolved.Will continue Rx SVT with persistent episodes of tachycardia. Therapy limited by low BP and orthostasis. Event monitor was  benign. Episode in hospital in setting of UTI/sepsis. On very low dose Toprol  DM Orthostatic hypotension. On midodrine . Recommend liberal salt intake and maintain good hydration. Recommend compression hose. Raise head of bed to activate baroreceptors.       Will follow up in one year   Medication Adjustments/Labs and Tests Ordered: Current medicines are reviewed at length with the patient today.  Concerns regarding medicines are outlined above.  No orders of the defined types were placed in this encounter.  No orders of the defined types were placed in this encounter.   There are no Patient Instructions on file for this visit.   Signed, Kavya Haag, MD  05/31/2024 3:18 PM    [facility]  "

## 2024-05-31 ENCOUNTER — Ambulatory Visit: Admitting: Cardiology

## 2024-05-31 ENCOUNTER — Encounter: Payer: Self-pay | Admitting: Cardiology

## 2024-05-31 VITALS — BP 110/60 | HR 82 | Ht 67.0 in | Wt 169.0 lb

## 2024-05-31 DIAGNOSIS — I2510 Atherosclerotic heart disease of native coronary artery without angina pectoris: Secondary | ICD-10-CM | POA: Diagnosis not present

## 2024-05-31 DIAGNOSIS — I471 Supraventricular tachycardia, unspecified: Secondary | ICD-10-CM

## 2024-05-31 NOTE — Patient Instructions (Signed)

## 2024-06-13 ENCOUNTER — Ambulatory Visit: Payer: Self-pay | Admitting: Cardiology

## 2024-06-13 DIAGNOSIS — I471 Supraventricular tachycardia, unspecified: Secondary | ICD-10-CM

## 2024-06-15 ENCOUNTER — Ambulatory Visit: Admitting: Cardiology
